# Patient Record
Sex: Female | Born: 1935
Health system: Southern US, Community
[De-identification: ages and names within clinical notes are randomized; demographics above are authoritative.]

## PROBLEM LIST (undated history)

## (undated) DIAGNOSIS — I10 Essential (primary) hypertension: Secondary | ICD-10-CM

## (undated) DIAGNOSIS — K219 Gastro-esophageal reflux disease without esophagitis: Secondary | ICD-10-CM

## (undated) DIAGNOSIS — E079 Disorder of thyroid, unspecified: Secondary | ICD-10-CM

## (undated) DIAGNOSIS — R5383 Other fatigue: Secondary | ICD-10-CM

## (undated) DIAGNOSIS — C55 Malignant neoplasm of uterus, part unspecified: Secondary | ICD-10-CM

## (undated) DIAGNOSIS — M1990 Unspecified osteoarthritis, unspecified site: Secondary | ICD-10-CM

## (undated) DIAGNOSIS — I639 Cerebral infarction, unspecified: Secondary | ICD-10-CM

## (undated) DIAGNOSIS — J189 Pneumonia, unspecified organism: Secondary | ICD-10-CM

## (undated) DIAGNOSIS — K5900 Constipation, unspecified: Secondary | ICD-10-CM

## (undated) DIAGNOSIS — E785 Hyperlipidemia, unspecified: Secondary | ICD-10-CM

## (undated) DIAGNOSIS — C50019 Malignant neoplasm of nipple and areola, unspecified female breast: Secondary | ICD-10-CM

## (undated) DIAGNOSIS — M810 Age-related osteoporosis without current pathological fracture: Secondary | ICD-10-CM

## (undated) DIAGNOSIS — Z889 Allergy status to unspecified drugs, medicaments and biological substances status: Secondary | ICD-10-CM

## (undated) DIAGNOSIS — J302 Other seasonal allergic rhinitis: Secondary | ICD-10-CM

## (undated) DIAGNOSIS — Z8619 Personal history of other infectious and parasitic diseases: Secondary | ICD-10-CM

## (undated) DIAGNOSIS — E039 Hypothyroidism, unspecified: Secondary | ICD-10-CM

## (undated) DIAGNOSIS — N390 Urinary tract infection, site not specified: Secondary | ICD-10-CM

## (undated) DIAGNOSIS — J449 Chronic obstructive pulmonary disease, unspecified: Secondary | ICD-10-CM

## (undated) HISTORY — DX: Malignant neoplasm of uterus, part unspecified: C55

## (undated) HISTORY — DX: Other fatigue: R53.83

## (undated) HISTORY — PX: BREAST LUMPECTOMY: SHX2

## (undated) HISTORY — DX: Urinary tract infection, site not specified: N39.0

## (undated) HISTORY — PX: TONSILLECTOMY: SUR1361

## (undated) HISTORY — PX: ABDOMINAL HYSTERECTOMY: SHX81

## (undated) HISTORY — DX: Constipation, unspecified: K59.00

## (undated) HISTORY — DX: Age-related osteoporosis without current pathological fracture: M81.0

## (undated) HISTORY — DX: Other seasonal allergic rhinitis: J30.2

## (undated) HISTORY — PX: EYE SURGERY: SHX253

## (undated) HISTORY — DX: Malignant neoplasm of nipple and areola, unspecified female breast: C50.019

---

## 1997-06-25 ENCOUNTER — Other Ambulatory Visit: Admission: RE | Admit: 1997-06-25 | Discharge: 1997-06-25 | Payer: Self-pay | Admitting: Family Medicine

## 1997-11-27 ENCOUNTER — Other Ambulatory Visit: Admission: RE | Admit: 1997-11-27 | Discharge: 1997-11-27 | Payer: Self-pay | Admitting: Obstetrics & Gynecology

## 2000-08-24 ENCOUNTER — Encounter: Payer: Self-pay | Admitting: Family Medicine

## 2000-08-24 ENCOUNTER — Encounter: Admission: RE | Admit: 2000-08-24 | Discharge: 2000-08-24 | Payer: Self-pay | Admitting: Family Medicine

## 2003-06-25 ENCOUNTER — Other Ambulatory Visit: Admission: RE | Admit: 2003-06-25 | Discharge: 2003-06-25 | Payer: Self-pay | Admitting: Obstetrics & Gynecology

## 2005-02-15 DIAGNOSIS — Z8619 Personal history of other infectious and parasitic diseases: Secondary | ICD-10-CM

## 2005-02-15 HISTORY — DX: Personal history of other infectious and parasitic diseases: Z86.19

## 2005-09-12 ENCOUNTER — Emergency Department (HOSPITAL_COMMUNITY): Admission: EM | Admit: 2005-09-12 | Discharge: 2005-09-12 | Payer: Self-pay | Admitting: Emergency Medicine

## 2005-10-19 ENCOUNTER — Encounter: Admission: RE | Admit: 2005-10-19 | Discharge: 2005-10-19 | Payer: Self-pay | Admitting: Internal Medicine

## 2005-11-09 ENCOUNTER — Ambulatory Visit (HOSPITAL_COMMUNITY): Admission: RE | Admit: 2005-11-09 | Discharge: 2005-11-09 | Payer: Self-pay | Admitting: Internal Medicine

## 2008-02-16 DIAGNOSIS — I639 Cerebral infarction, unspecified: Secondary | ICD-10-CM

## 2008-02-16 HISTORY — DX: Cerebral infarction, unspecified: I63.9

## 2008-05-13 ENCOUNTER — Encounter: Admission: RE | Admit: 2008-05-13 | Discharge: 2008-05-13 | Payer: Self-pay | Admitting: Internal Medicine

## 2008-05-14 ENCOUNTER — Ambulatory Visit: Admission: RE | Admit: 2008-05-14 | Discharge: 2008-05-14 | Payer: Self-pay | Admitting: Internal Medicine

## 2008-05-14 ENCOUNTER — Encounter (INDEPENDENT_AMBULATORY_CARE_PROVIDER_SITE_OTHER): Payer: Self-pay | Admitting: Internal Medicine

## 2008-05-14 ENCOUNTER — Ambulatory Visit: Payer: Self-pay | Admitting: Surgery

## 2008-06-05 ENCOUNTER — Ambulatory Visit: Payer: Self-pay | Admitting: Vascular Surgery

## 2008-06-10 ENCOUNTER — Encounter: Payer: Self-pay | Admitting: Vascular Surgery

## 2008-06-10 ENCOUNTER — Inpatient Hospital Stay (HOSPITAL_COMMUNITY): Admission: RE | Admit: 2008-06-10 | Discharge: 2008-06-14 | Payer: Self-pay | Admitting: Vascular Surgery

## 2008-06-10 HISTORY — PX: CAROTID ENDARTERECTOMY: SUR193

## 2008-06-26 ENCOUNTER — Ambulatory Visit: Payer: Self-pay | Admitting: Vascular Surgery

## 2008-08-29 ENCOUNTER — Ambulatory Visit (HOSPITAL_COMMUNITY): Admission: RE | Admit: 2008-08-29 | Discharge: 2008-08-29 | Payer: Self-pay | Admitting: Internal Medicine

## 2009-01-01 ENCOUNTER — Ambulatory Visit: Payer: Self-pay | Admitting: Vascular Surgery

## 2009-08-20 ENCOUNTER — Ambulatory Visit: Payer: Self-pay | Admitting: Vascular Surgery

## 2010-05-24 LAB — BLOOD GAS, ARTERIAL
Acid-Base Excess: 5.7 mmol/L — ABNORMAL HIGH (ref 0.0–2.0)
Bicarbonate: 29.5 mEq/L — ABNORMAL HIGH (ref 20.0–24.0)
Drawn by: 12206
FIO2: 0.21 %
O2 Saturation: 89.2 %
Patient temperature: 98.6
TCO2: 30.8 mmol/L (ref 0–100)
pCO2 arterial: 41.7 mmHg (ref 35.0–45.0)
pH, Arterial: 7.464 — ABNORMAL HIGH (ref 7.350–7.400)
pO2, Arterial: 56.8 mmHg — ABNORMAL LOW (ref 80.0–100.0)

## 2010-05-27 LAB — BASIC METABOLIC PANEL
BUN: 13 mg/dL (ref 6–23)
BUN: 18 mg/dL (ref 6–23)
Calcium: 9.1 mg/dL (ref 8.4–10.5)
Chloride: 102 mEq/L (ref 96–112)
Chloride: 102 mEq/L (ref 96–112)
Creatinine, Ser: 0.93 mg/dL (ref 0.4–1.2)
Creatinine, Ser: 0.99 mg/dL (ref 0.4–1.2)
GFR calc Af Amer: >60 mL/min (ref >60)
GFR calc non Af Amer: 57 mL/min — ABNORMAL LOW (ref >60)
Glucose, Bld: 139 mg/dL — ABNORMAL HIGH (ref 70–99)

## 2010-05-27 LAB — COMPREHENSIVE METABOLIC PANEL
Alkaline Phosphatase: 73 U/L (ref 39–117)
BUN: 23 mg/dL (ref 6–23)
CO2: 31 mEq/L (ref 19–32)
Chloride: 103 mEq/L (ref 96–112)
GFR calc non Af Amer: 43 mL/min — ABNORMAL LOW (ref >60)
Glucose, Bld: 105 mg/dL — ABNORMAL HIGH (ref 70–99)
Potassium: 4.2 mEq/L (ref 3.5–5.1)
Total Bilirubin: 0.6 mg/dL (ref 0.3–1.2)

## 2010-05-27 LAB — CBC
HCT: 31 % — ABNORMAL LOW (ref 36.0–46.0)
HCT: 39.2 % (ref 36.0–46.0)
Hemoglobin: 13.4 g/dL (ref 12.0–15.0)
MCV: 92.4 fL (ref 78.0–100.0)
Platelets: 192 10*3/uL (ref 150–400)
RBC: 4.2 MIL/uL (ref 3.87–5.11)
RDW: 14.1 % (ref 11.5–15.5)
RDW: 14.2 % (ref 11.5–15.5)
WBC: 10.5 10*3/uL (ref 4.0–10.5)
WBC: 7.9 10*3/uL (ref 4.0–10.5)

## 2010-05-27 LAB — PROTIME-INR
INR: 1 (ref 0.00–1.49)
Prothrombin Time: 13.5 seconds (ref 11.6–15.2)

## 2010-05-27 LAB — URINALYSIS, ROUTINE W REFLEX MICROSCOPIC
Bilirubin Urine: NEGATIVE
Glucose, UA: NEGATIVE mg/dL
Hgb urine dipstick: NEGATIVE
Protein, ur: NEGATIVE mg/dL
Urobilinogen, UA: 1 mg/dL (ref 0.0–1.0)

## 2010-05-27 LAB — TYPE AND SCREEN: ABO/RH(D): A POS

## 2010-05-27 LAB — ABO/RH: ABO/RH(D): A POS

## 2010-05-27 LAB — URINE MICROSCOPIC-ADD ON

## 2010-06-30 NOTE — Assessment & Plan Note (Signed)
OFFICE VISIT   VLASTA, LILLYWHITE  DOB:  [birth year]                                       06/05/2008  CHART#:[id number]   Ms. Glauner is an 77 year old female referred by Dr. Lavone Orn for  evaluation of carotid disease.  The patient apparently had an episode of  blurry vision in both eyes near the end of March.  She did not have any  confusion at that time or any slurring of her speech.  However, she  stated that she did have double vision at that time.  She stated this  returned to normal after about 3 days.  Subsequently, she had an MRI the  brain which showed a small infarct of the left parietal region.   Her primary atherosclerotic risk factors include age, hypertension,  elevated cholesterol.  She has had no prior strokes.  She has no prior  TIA episodes.  She has had no similar episodes or any episodes of  amaurosis since that time.   Her past medical history, otherwise, is fairly unremarkable.   PAST SURGICAL HISTORY:  She had a hysterectomy and breast lumpectomy.   MEDICATIONS:  1. Levothyroxine 100 mg once a day.  2. Zocor once a day.  3. Aspirin 325 mg once a day.  4. Norvasc once a day.  5. Diovan HCT once a day.  6. Allegra once a day.  7. ICaps once a day.  8. Macrobid 3 times per week.  9. Vitamin D once monthly.  10.Centrum Silver once a day.  She did not know the doses for these medications.   She is ALLERGIC TO PENICILLIN which caused a rash.  She also had a  REACTION TO SULFA which caused her shortness of breath.   FAMILY HISTORY:  Unremarkable.   SOCIAL HISTORY:  She is widowed.  She is a former smoker but quit in  1983.  She drinks 2-3 alcoholic beverages per week.   REVIEW OF SYSTEMS:  She is 5 feet tall, 130 pounds.  CARDIAC:  She has dyspnea with exertion.  GI:  She has some mild reflux.  GU:  She has urinary frequency.  PULMONARY, VASCULAR, NEUROLOGIC, ORTHOPEDIC, PSYCHIATRIC, ENT, and  HEMATOLOGIC review of systems  are otherwise negative.   PHYSICAL EXAM:  Blood pressure is 162/71 in the left arm, 155/68 in the  right arm.  Pulse is 80 and regular.  HEENT:  Unremarkable.  Neck:  Has  2+ carotid pulses without bruit.  Chest:  Clear to auscultation.  Cardiac exam is regular rate rhythm without murmur.  Abdomen is soft,  nontender, nondistended, no masses.  Extremities:  She has 2+ radial,  femoral, dorsalis pedis, and posterior tibial pulses bilaterally.   She had a carotid duplex exam at [facility], May 14, 2008.  This showed a 60% to 80% left internal carotid artery stenosis and no  significant right internal carotid artery stenosis.  She had antegrade  vertebral flow bilaterally.   Neurologic exam today shows symmetric upper extremity lower extremity  motor strength.  Extraocular movements are intact.  She has no obvious  visual field cuts.   I had a lengthy discussion with Mrs. Kucera today as well as her niece  who was here with her for the office visit today.  I explained to her  that she has a moderate carotid  stenosis on the left side but most  likely this was responsible for the stroke that she had previously.  I  believe the best option for her would be a left carotid endarterectomy.  The risks, benefits, possible complications, and procedure details  including but not limited to bleeding, infection, cranial nerve injury,  stroke risk of 1% to 2%, were explained to the patient as well as option  of medical management with higher stroke risk.  She understands and  agrees to proceed with carotid endarterectomy.  This is scheduled for  Monday, June 10, 2008.   Jessy Oto. Fields, MD  Electronically Signed   CEF/MEDQ  D:  06/05/2008  T:  06/06/2008  Job:  [id number]   cc:   Delanna Ahmadi, M.D.

## 2010-06-30 NOTE — Procedures (Signed)
CAROTID DUPLEX EXAM   INDICATION:  Follow up carotid artery disease.   HISTORY:  Diabetes:  No.  Cardiac:  No.  Hypertension:  Yes.  Smoking:  No.  Previous Surgery:  Left CEA June 10, 2008.  CV History:  Asymptomatic.  Amaurosis Fugax No, Paresthesias No, Hemiparesis No                                       RIGHT               LEFT  Brachial systolic pressure:         120                 114  Brachial Doppler waveforms:         WNL                 WNL  Vertebral direction of flow:        Antegrade           Antegrade  DUPLEX VELOCITIES (cm/sec)  CCA peak systolic                   120                 [id number]  ECA peak systolic                   154                 221 (mid)  ICA peak systolic                   160                 0000000  ICA end diastolic                   34                  37  PLAQUE MORPHOLOGY:                  Mixed               Homogenous  PLAQUE AMOUNT:                      Moderate            None in ICA  PLAQUE LOCATION:                    ICA/ECA/bifurcation ECA   IMPRESSION:  1. Right internal carotid artery shows evidence of 40% to 59%      stenosis.  2. Left internal carotid artery velocities are suggestive of 40% to      59% stenosis near distal end of patch, however no plaque visualized      status post carotid endarterectomy.  3. Left mid external carotid artery stenosis.   ___________________________________________  Jessy Oto Fields, MD   AS/MEDQ  D:  01/01/2009  T:  01/01/2009  Job:  [id number]

## 2010-06-30 NOTE — Discharge Summary (Signed)
NAME:  Katherine [name], Katherine [name]                 ACCOUNT NO.:  [id number]   MEDICAL RECORD NO.:  [id number]          PATIENT TYPE:  INP   LOCATION:  2020                         FACILITY:  [facility]   PHYSICIAN:  Jessy Oto. Fields, MD  DATE OF BIRTH:  06/28/1917   DATE OF ADMISSION:  06/10/2008  DATE OF DISCHARGE:  06/14/2008                               DISCHARGE SUMMARY   FINAL DISCHARGE DIAGNOSES:  1. Carotid occlusive disease on left side.  2. Hypothyroidism.  3. Dyslipidemia.  4. Hypertension.  5. Chronic urinary tract infections.   PROCEDURES PERFORMED:  Left carotid endarterectomy with Dacron patch  angioplasty closure by Dr. Oneida Alar on June 10, 2008.   COMPLICATIONS:  None.   CONDITION ON DISCHARGE:  Stable, improving.   DISCHARGE MEDICATIONS:  She is instructed to resume preoperative  medications consisting of:  1. Synthroid 100 mcg p.o. daily.  2. Zocor 20 mg p.o. daily.  3. Aspirin 325 mg p.o. daily.  4. Diovan/hydrochlorothiazide 160/12.5 p.o. daily.  5. Norvasc 5 mg p.o. daily.  6. I-Cap 1 p.o. daily.  7. Allegra p.o. daily.  8. Vitamin D 50,000 international units q. month.  9. Macrobid 100 mg p.o. 3 times weekly.  10.Centrum Silver p.o. daily.  11.Calcium 600 mg plus vitamin D p.o. daily.  12.Percocet 5/325 one p.o. q.4 h. p.r.n. pain, total number of 4      tablets were given.   DISPOSITION:  She is being discharged home in stable condition with her  wounds healing well.  She is given careful instructions regarding the  care of her wounds and her activity level.  She is scheduled to see Dr.  Oneida Alar in 2 weeks.  The office will arrange a visit.  She is also being  discharged home on 2 liters of oxygen.  O2 saturations performed  predischarge demonstrated O2 sats to be 81-85% on room air.   Brief identifying statement of complete details, please refer the typed  history and physical.  Briefly, this very pleasant 77 year old woman was  referred to Dr. Oneida Alar for  evaluation of carotid disease following an  episode of blurry vision in both eyes.  She was found to have a  moderately narrowed left carotid artery.  Dr. Oneida Alar recommended left  carotid endarterectomy for stroke prevention.  She was informed of the  risks and benefits of the procedure and after careful consideration she  elected to proceed with surgery.   HOSPITAL COURSE:  Preoperative workup was completed as an outpatient.  She was brought in through same-day surgery and underwent the  aforementioned left carotid endarterectomy.  For complete details,  please refer the typed operative report.  The procedure was without  complication.  She was returned to the [facility]  extubated.  Following stabilization, she was transferred to a bed on a  surgical step-down unit.  Following morning, she appeared somewhat weak  and debilitated.  We elected to keep her in the hospital and transfer  her to a bed on a surgical convalescent floor.   Following transfer, her activity level and diet were advanced as  tolerated.  She did have some issues with oxygenation which at this time  is should clear within the next 2-3 weeks.  She is being discharged with  oxygen in stable condition on June 14, 2008.      Chad Cordial, PA      Jessy Oto. Fields, MD  Electronically Signed    KEL/MEDQ  D:  06/14/2008  T:  06/14/2008  Job:  [id number]

## 2010-06-30 NOTE — Assessment & Plan Note (Signed)
OFFICE VISIT   GLADYSE, DUREE  DOB:  1917/08/15                                       06/26/2008  CHART#:[id number]   The patient returns for follow-up today after her left carotid  endarterectomy on April 26th.  She had some problems with oxygen  desaturation postoperatively and was sent home on oxygen.  She has been  using 2 liters of oxygen at home and states she has basically been  asymptomatic and has the oxygen off occasionally without symptoms.  She  is doing some physical therapy and says she has some occasional  desaturations during strenuous activity but otherwise is fine.  She  denies any symptoms of TIA, amaurosis or stroke.  She has no neurologic  deficits.   PHYSICAL EXAMINATION:  Today, blood pressure 161/72 in the left arm,  169/73 in the right arm, pulse is 70 and regular.  Oxygen saturations  today, after being on room air for approximately 15 minutes, were 94%.  Left neck incision is well-healed.  She has no carotid bruits.   She continues to take aspirin 325 mg once a day.   Overall, the patient seems to be recovering well from her carotid  endarterectomy.  We have discontinued her home oxygen today.  She has  follow-up with Dr. Lavone Orn in June.  He will re-evaluate her  oxygenation and symptoms at his discretion whether or not she would need  further oxygen in the future.  However, since she did not require this  preoperatively, I do not believe she will probably need this in the long-  term.  She will follow up with me for repeat carotid duplex exam in 6  months' time.   Jessy Oto. Fields, MD  Electronically Signed   CEF/MEDQ  D:  06/26/2008  T:  06/27/2008  Job:  [id number]   cc:   Delanna Ahmadi, M.D.

## 2010-06-30 NOTE — Procedures (Signed)
CAROTID DUPLEX EXAM   INDICATION:  Follow up carotid artery disease.   HISTORY:  Diabetes:  No.  Cardiac:  No.  Hypertension:  Yes.  Smoking:  No.  Previous Surgery:  Left carotid endarterectomy 06/10/2008.  CV History:  No.  Amaurosis Fugax No, Paresthesias No, Hemiparesis No.                                       RIGHT             LEFT  Brachial systolic pressure:         115               120  Brachial Doppler waveforms:         WNL               WNL  Vertebral direction of flow:        Antegrade         Antegrade  DUPLEX VELOCITIES (cm/sec)  CCA peak systolic                   88                97  ECA peak systolic                   71                [id number]  ICA peak systolic                   140               83  ICA end diastolic                   32                21  PLAQUE MORPHOLOGY:                  Heterogenous      Heterogenous  PLAQUE AMOUNT:                      Mild              Mild  PLAQUE LOCATION:                    ICA               ICA   IMPRESSION:  1. Right internal carotid artery suggests 40% to 59% stenosis.  2. Left internal carotid artery suggests 20% to 39% stenosis, status      post carotid endarterectomy.  These velocities do not appear as      elevated as previous study.  3. Left external carotid artery stenosis.  4. Bilateral vertebrals appear antegrade.   ___________________________________________  Jessy Oto. Fields, MD   CB/MEDQ  D:  08/20/2009  T:  08/20/2009  Job:  [id number]

## 2010-06-30 NOTE — Op Note (Signed)
NAME:  Katherine [name], Katherine [name]                 ACCOUNT NO.:  [id number]   MEDICAL RECORD NO.:  [id number]          PATIENT TYPE:  INP   LOCATION:  2020                         FACILITY:  [facility]   PHYSICIAN:  Jessy Oto. Fields, MD  DATE OF BIRTH:  [birth year]   DATE OF PROCEDURE:  06/10/2008  DATE OF DISCHARGE:                               OPERATIVE REPORT   PROCEDURE:  Left carotid endarterectomy.   PREOPERATIVE DIAGNOSIS:  Symptomatic left internal carotid artery  stenosis.   POSTOPERATIVE DIAGNOSIS:  Symptomatic left internal carotid artery  stenosis.   ANESTHESIA:  General.   ASSISTANT:  Jacinta Shoe, PA-C   OPERATIVE FINDINGS:  1. An 80% heavily calcified left internal carotid artery stenosis.  2. Dacron patch.  3. A 10-French hunt.   OPERATIVE DETAILS:  After obtaining informed consent, the patient was  taken to the operating room.  The patient was placed in supine position  on the operating room table.  After induction of general anesthesia and  endotracheal intubation, the patient's entire left neck and chest were  prepped and draped in usual sterile fashion.  Next, an oblique incision  was made along the left aspect of the neck just anterior to the border  of the left sternocleidomastoid muscle.  Dissection was carried down  through the subcutaneous tissues and platysma muscle.  Left  sternocleidomastoid muscle was identified and reflected laterally.  The  left internal jugular vein was identified and reflected laterally.  The  common facial vein was dissected free circumferentially and ligated and  divided between silk ties.  The omohyoid muscle was divided with  cautery.  The common carotid artery was dissected free at the base of  the incision and an umbilical tape placed around this.  The vagus nerve  was identified and protected.  Dissection was then carried up to the  level of carotid bifurcation.  The external carotids and superior  thyroid arteries were dissected  free circumferentially and vessel loop  placed around these.  The lesion in the internal carotid artery was  identified by palpation.  The internal carotid artery was dissected free  circumferentially above the level of lesion.  This required mobilization  of the ansa up to the level of the hypoglossal nerve and I dissected the  inferior portion of the hypoglossal nerve free, but I was able to get up  to a suitable portion of the left internal carotid artery without  mobilizing the hypoglossal nerve significantly.  A vessel loop was  placed around the internal carotid artery at this level as well.   The patient was then given 5000 units of intravenous heparin.  Left  internal carotid artery was controlled with a small bulldog clamp.  The  external carotid and superior thyroid artery was controlled with vessel  loops.  The common carotid artery was controlled with peripheral DeBakey  clamp.  A longitudinal opening was made in the carotid artery just below  the level of bifurcation.  The arteriotomy was extended up through the  carotid bifurcation to the internal carotid artery using Potts scissors.  There  was a heavily calcified lesion with proximally 80% stenosis and  this was opened longitudinally up of more normal segment of the left  internal carotid artery.  A 10-French shunt was then brought up in the  operative field and threaded into the distal internal carotid artery and  allowed to back bleed thoroughly.  There was good pulsatile  backbleeding.  This was then threaded down into the left common carotid  artery and secured with a Rumel tourniquet.  The shunt was opened with  restoration of flow to the brain after approximately 4 minutes of  ischemia time.  Next, endarterectomy was begun in a suitable plane near  the carotid bifurcation.  The plaque was removed with a good proximal  and distal endpoint.  Plaque was passed off table as specimen.  The  internal carotid artery was  endarterectomized by eversion technique.  A  Dacron patch was then brought up in the operative field and sewn as  patch angioplasty using running 6-0 Prolene suture.  Just prior  completion of anastomosis, the shunt was then reoccluded with a  hemostat.  Shunt was pulled down onto the left internal carotid artery  and this was allowed to back bleed thoroughly.  This was then resecured  with fine bulldog clamp.  Shunt was then removed from the proximal  common carotid artery and this was secured with peripheral DeBakey  clamp.  The external carotid artery was thoroughly back bled and then  resecured with a vessel loop.  Everything was thoroughly irrigated with  heparinized saline.  The remainder of the patch was completed.  Flow was  then first restored from common carotid artery up to the external  carotid artery after approximately 5 cardiac cycles to the internal  carotid artery.  This was then inspected with Doppler.  There was good  Doppler flow through the internal, external, and common carotid arteries  at this point.  Next, hemostasis was obtained.  Platysma muscle was  reapproximated using running 3-0 Vicryl suture.  Skin was closed with a  4-0 Vicryl subcuticular stitch.  The patient tolerated the procedure  well, and there were no complications.  Instrument, sponge, and needle  counts were correct at the end of the case.  The patient was awakened in  the operating room and moving upper extremities and lower extremities  symmetrically at the end of the case.  The patient was taken to the  recovery room in stable condition.      Jessy Oto. Fields, MD  Electronically Signed     CEF/MEDQ  D:  06/11/2008  T:  06/12/2008  Job:  [id number]

## 2010-09-03 ENCOUNTER — Encounter: Payer: Self-pay | Admitting: Podiatry

## 2010-10-12 ENCOUNTER — Ambulatory Visit
Admission: RE | Admit: 2010-10-12 | Discharge: 2010-10-12 | Disposition: A | Discharge status: Home / Self Care | Payer: Medicare Other | Source: Ambulatory Visit | Attending: Internal Medicine | Admitting: Internal Medicine

## 2010-10-12 ENCOUNTER — Other Ambulatory Visit: Payer: Self-pay | Admitting: Internal Medicine

## 2010-10-12 DIAGNOSIS — M79669 Pain in unspecified lower leg: Secondary | ICD-10-CM

## 2011-03-08 DIAGNOSIS — K219 Gastro-esophageal reflux disease without esophagitis: Secondary | ICD-10-CM | POA: Diagnosis not present

## 2011-03-08 DIAGNOSIS — J449 Chronic obstructive pulmonary disease, unspecified: Secondary | ICD-10-CM | POA: Diagnosis not present

## 2011-03-08 DIAGNOSIS — E039 Hypothyroidism, unspecified: Secondary | ICD-10-CM | POA: Diagnosis not present

## 2011-03-08 DIAGNOSIS — E785 Hyperlipidemia, unspecified: Secondary | ICD-10-CM | POA: Diagnosis not present

## 2011-03-08 DIAGNOSIS — I10 Essential (primary) hypertension: Secondary | ICD-10-CM | POA: Diagnosis not present

## 2011-03-15 DIAGNOSIS — B351 Tinea unguium: Secondary | ICD-10-CM | POA: Diagnosis not present

## 2011-03-23 DIAGNOSIS — R350 Frequency of micturition: Secondary | ICD-10-CM | POA: Diagnosis not present

## 2011-03-23 DIAGNOSIS — R351 Nocturia: Secondary | ICD-10-CM | POA: Diagnosis not present

## 2011-03-23 DIAGNOSIS — N3020 Other chronic cystitis without hematuria: Secondary | ICD-10-CM | POA: Diagnosis not present

## 2011-05-10 DIAGNOSIS — B351 Tinea unguium: Secondary | ICD-10-CM | POA: Diagnosis not present

## 2011-06-16 DIAGNOSIS — L821 Other seborrheic keratosis: Secondary | ICD-10-CM | POA: Diagnosis not present

## 2011-06-16 DIAGNOSIS — L570 Actinic keratosis: Secondary | ICD-10-CM | POA: Diagnosis not present

## 2011-06-16 DIAGNOSIS — D692 Other nonthrombocytopenic purpura: Secondary | ICD-10-CM | POA: Diagnosis not present

## 2011-06-16 DIAGNOSIS — Z85828 Personal history of other malignant neoplasm of skin: Secondary | ICD-10-CM | POA: Diagnosis not present

## 2011-07-19 DIAGNOSIS — B351 Tinea unguium: Secondary | ICD-10-CM | POA: Diagnosis not present

## 2011-07-23 ENCOUNTER — Emergency Department (HOSPITAL_COMMUNITY): Payer: Medicare Other

## 2011-07-23 ENCOUNTER — Inpatient Hospital Stay (HOSPITAL_COMMUNITY)
Admission: EM | Admit: 2011-07-23 | Discharge: 2011-07-27 | DRG: 193 | Disposition: A | Discharge status: Home Health | Payer: Medicare Other | Source: Ambulatory Visit | Attending: Internal Medicine | Admitting: Internal Medicine

## 2011-07-23 ENCOUNTER — Encounter (HOSPITAL_COMMUNITY): Payer: Self-pay | Admitting: *Deleted

## 2011-07-23 DIAGNOSIS — E782 Mixed hyperlipidemia: Secondary | ICD-10-CM | POA: Diagnosis not present

## 2011-07-23 DIAGNOSIS — J189 Pneumonia, unspecified organism: Principal | ICD-10-CM | POA: Diagnosis present

## 2011-07-23 DIAGNOSIS — I10 Essential (primary) hypertension: Secondary | ICD-10-CM | POA: Diagnosis not present

## 2011-07-23 DIAGNOSIS — K5900 Constipation, unspecified: Secondary | ICD-10-CM | POA: Diagnosis not present

## 2011-07-23 DIAGNOSIS — Z79899 Other long term (current) drug therapy: Secondary | ICD-10-CM | POA: Diagnosis not present

## 2011-07-23 DIAGNOSIS — J4489 Other specified chronic obstructive pulmonary disease: Secondary | ICD-10-CM | POA: Diagnosis present

## 2011-07-23 DIAGNOSIS — Z7982 Long term (current) use of aspirin: Secondary | ICD-10-CM

## 2011-07-23 DIAGNOSIS — R0902 Hypoxemia: Secondary | ICD-10-CM

## 2011-07-23 DIAGNOSIS — J9620 Acute and chronic respiratory failure, unspecified whether with hypoxia or hypercapnia: Secondary | ICD-10-CM | POA: Diagnosis present

## 2011-07-23 DIAGNOSIS — R042 Hemoptysis: Secondary | ICD-10-CM | POA: Diagnosis not present

## 2011-07-23 DIAGNOSIS — E039 Hypothyroidism, unspecified: Secondary | ICD-10-CM | POA: Diagnosis not present

## 2011-07-23 DIAGNOSIS — Z66 Do not resuscitate: Secondary | ICD-10-CM | POA: Diagnosis present

## 2011-07-23 DIAGNOSIS — Z8673 Personal history of transient ischemic attack (TIA), and cerebral infarction without residual deficits: Secondary | ICD-10-CM | POA: Diagnosis not present

## 2011-07-23 DIAGNOSIS — Z87891 Personal history of nicotine dependence: Secondary | ICD-10-CM

## 2011-07-23 DIAGNOSIS — Z418 Encounter for other procedures for purposes other than remedying health state: Secondary | ICD-10-CM

## 2011-07-23 DIAGNOSIS — E785 Hyperlipidemia, unspecified: Secondary | ICD-10-CM | POA: Diagnosis present

## 2011-07-23 DIAGNOSIS — R599 Enlarged lymph nodes, unspecified: Secondary | ICD-10-CM | POA: Diagnosis not present

## 2011-07-23 DIAGNOSIS — J438 Other emphysema: Secondary | ICD-10-CM | POA: Diagnosis not present

## 2011-07-23 DIAGNOSIS — J449 Chronic obstructive pulmonary disease, unspecified: Secondary | ICD-10-CM | POA: Diagnosis present

## 2011-07-23 DIAGNOSIS — R918 Other nonspecific abnormal finding of lung field: Secondary | ICD-10-CM | POA: Diagnosis not present

## 2011-07-23 DIAGNOSIS — R911 Solitary pulmonary nodule: Secondary | ICD-10-CM | POA: Diagnosis not present

## 2011-07-23 DIAGNOSIS — J159 Unspecified bacterial pneumonia: Secondary | ICD-10-CM

## 2011-07-23 DIAGNOSIS — Z2989 Encounter for other specified prophylactic measures: Secondary | ICD-10-CM

## 2011-07-23 DIAGNOSIS — J984 Other disorders of lung: Secondary | ICD-10-CM | POA: Diagnosis not present

## 2011-07-23 HISTORY — DX: Hypothyroidism, unspecified: E03.9

## 2011-07-23 HISTORY — DX: Cerebral infarction, unspecified: I63.9

## 2011-07-23 HISTORY — DX: Personal history of other infectious and parasitic diseases: Z86.19

## 2011-07-23 HISTORY — DX: Allergy status to unspecified drugs, medicaments and biological substances: Z88.9

## 2011-07-23 HISTORY — DX: Disorder of thyroid, unspecified: E07.9

## 2011-07-23 HISTORY — DX: Essential (primary) hypertension: I10

## 2011-07-23 HISTORY — DX: Gastro-esophageal reflux disease without esophagitis: K21.9

## 2011-07-23 HISTORY — DX: Unspecified osteoarthritis, unspecified site: M19.90

## 2011-07-23 HISTORY — DX: Hyperlipidemia, unspecified: E78.5

## 2011-07-23 HISTORY — DX: Pneumonia, unspecified organism: J18.9

## 2011-07-23 HISTORY — DX: Chronic obstructive pulmonary disease, unspecified: J44.9

## 2011-07-23 LAB — DIFFERENTIAL
Basophils Relative: 1 % (ref 0–1)
Eosinophils Absolute: 0.1 10*3/uL (ref 0.0–0.7)
Monocytes Relative: 9 % (ref 3–12)
Neutro Abs: 6.3 10*3/uL (ref 1.7–7.7)
Neutrophils Relative %: 65 % (ref 43–77)

## 2011-07-23 LAB — URINALYSIS, ROUTINE W REFLEX MICROSCOPIC
Bilirubin Urine: NEGATIVE
Ketones, ur: 15 mg/dL — AB
Leukocytes, UA: NEGATIVE
Nitrite: NEGATIVE
Protein, ur: NEGATIVE mg/dL
Urobilinogen, UA: 0.2 mg/dL (ref 0.0–1.0)
pH: 7.5 (ref 5.0–8.0)

## 2011-07-23 LAB — POCT I-STAT, CHEM 8
Creatinine, Ser: 1.2 mg/dL — ABNORMAL HIGH (ref 0.50–1.10)
HCT: 35 % — ABNORMAL LOW (ref 36.0–46.0)
Hemoglobin: 11.9 g/dL — ABNORMAL LOW (ref 12.0–15.0)
Potassium: 4.1 mEq/L (ref 3.5–5.1)
Sodium: 142 mEq/L (ref 135–145)
TCO2: 29 mmol/L (ref 0–100)

## 2011-07-23 LAB — CBC
Hemoglobin: 11.7 g/dL — ABNORMAL LOW (ref 12.0–15.0)
MCH: 30.3 pg (ref 26.0–34.0)
Platelets: 293 10*3/uL (ref 150–400)
RBC: 3.86 MIL/uL — ABNORMAL LOW (ref 3.87–5.11)
WBC: 9.7 10*3/uL (ref 4.0–10.5)

## 2011-07-23 LAB — PROTIME-INR
INR: 1.05 (ref 0.00–1.49)
Prothrombin Time: 13.9 seconds (ref 11.6–15.2)

## 2011-07-23 MED ORDER — GUAIFENESIN ER 600 MG PO TB12
600.0000 mg | ORAL_TABLET | Freq: Two times a day (BID) | ORAL | Status: DC
  Administered 2011-07-24 – 2011-07-25 (×4): 600 mg via ORAL
  Filled 2011-07-23 (×7): qty 1

## 2011-07-23 MED ORDER — SIMVASTATIN 20 MG PO TABS
20.0000 mg | ORAL_TABLET | Freq: Every evening | ORAL | Status: DC
  Administered 2011-07-24 – 2011-07-25 (×2): 20 mg via ORAL
  Filled 2011-07-23 (×4): qty 1

## 2011-07-23 MED ORDER — FLUTICASONE PROPIONATE 50 MCG/ACT NA SUSP
2.0000 | Freq: Every day | NASAL | Status: DC
  Administered 2011-07-24 – 2011-07-26 (×3): 2 via NASAL
  Filled 2011-07-23 (×2): qty 16

## 2011-07-23 MED ORDER — AZELASTINE HCL 0.1 % NA SOLN
1.0000 | Freq: Two times a day (BID) | NASAL | Status: DC
  Administered 2011-07-24 – 2011-07-27 (×7): 1 via NASAL
  Filled 2011-07-23 (×2): qty 30

## 2011-07-23 MED ORDER — DEXTROSE 5 % IV SOLN
500.0000 mg | Freq: Once | INTRAVENOUS | Status: AC
  Administered 2011-07-23: 500 mg via INTRAVENOUS
  Filled 2011-07-23 (×2): qty 500

## 2011-07-23 MED ORDER — VANCOMYCIN HCL 500 MG IV SOLR
500.0000 mg | INTRAVENOUS | Status: DC
  Administered 2011-07-24 (×2): 500 mg via INTRAVENOUS
  Filled 2011-07-23 (×5): qty 500

## 2011-07-23 MED ORDER — NITROFURANTOIN MONOHYD MACRO 100 MG PO CAPS
100.0000 mg | ORAL_CAPSULE | Freq: Two times a day (BID) | ORAL | Status: DC
  Administered 2011-07-24 – 2011-07-25 (×4): 100 mg via ORAL
  Filled 2011-07-23 (×7): qty 1

## 2011-07-23 MED ORDER — FLUTICASONE-SALMETEROL 250-50 MCG/DOSE IN AEPB
1.0000 | INHALATION_SPRAY | Freq: Two times a day (BID) | RESPIRATORY_TRACT | Status: DC
  Administered 2011-07-24 – 2011-07-27 (×6): 1 via RESPIRATORY_TRACT
  Filled 2011-07-23 (×3): qty 14

## 2011-07-23 MED ORDER — DEXTROSE 5 % IV SOLN
500.0000 mg | INTRAVENOUS | Status: DC
  Administered 2011-07-24 – 2011-07-25 (×2): 500 mg via INTRAVENOUS
  Filled 2011-07-23 (×2): qty 500

## 2011-07-23 MED ORDER — IOHEXOL 350 MG/ML SOLN
100.0000 mL | Freq: Once | INTRAVENOUS | Status: AC | PRN
  Administered 2011-07-23: 100 mL via INTRAVENOUS

## 2011-07-23 MED ORDER — PIPERACILLIN-TAZOBACTAM 3.375 G IVPB
3.3750 g | Freq: Three times a day (TID) | INTRAVENOUS | Status: DC
  Administered 2011-07-24 – 2011-07-26 (×7): 3.375 g via INTRAVENOUS
  Filled 2011-07-23 (×11): qty 50

## 2011-07-23 MED ORDER — IPRATROPIUM BROMIDE 0.02 % IN SOLN
0.5000 mg | Freq: Four times a day (QID) | RESPIRATORY_TRACT | Status: DC
  Administered 2011-07-24 – 2011-07-26 (×10): 0.5 mg via RESPIRATORY_TRACT
  Filled 2011-07-23 (×10): qty 2.5

## 2011-07-23 MED ORDER — SODIUM CHLORIDE 0.9 % IV SOLN
INTRAVENOUS | Status: DC
  Administered 2011-07-24 – 2011-07-25 (×2): via INTRAVENOUS

## 2011-07-23 MED ORDER — ALBUTEROL SULFATE (5 MG/ML) 0.5% IN NEBU
2.5000 mg | INHALATION_SOLUTION | RESPIRATORY_TRACT | Status: DC | PRN
  Administered 2011-07-24 – 2011-07-26 (×4): 2.5 mg via RESPIRATORY_TRACT
  Filled 2011-07-23 (×4): qty 0.5

## 2011-07-23 MED ORDER — LEVOTHYROXINE SODIUM 75 MCG PO TABS
75.0000 ug | ORAL_TABLET | Freq: Every day | ORAL | Status: DC
  Administered 2011-07-24 – 2011-07-27 (×4): 75 ug via ORAL
  Filled 2011-07-23 (×6): qty 1

## 2011-07-23 MED ORDER — AMLODIPINE BESYLATE 5 MG PO TABS
5.0000 mg | ORAL_TABLET | Freq: Every day | ORAL | Status: DC
  Administered 2011-07-24 – 2011-07-27 (×4): 5 mg via ORAL
  Filled 2011-07-23 (×4): qty 1

## 2011-07-23 MED ORDER — PIPERACILLIN-TAZOBACTAM 3.375 G IVPB 30 MIN
3.3750 g | Freq: Once | INTRAVENOUS | Status: AC
  Administered 2011-07-24: 3.375 g via INTRAVENOUS
  Filled 2011-07-23: qty 50

## 2011-07-23 MED ORDER — DEXTROSE 5 % IV SOLN
1.0000 g | Freq: Once | INTRAVENOUS | Status: AC
  Administered 2011-07-23: 1 g via INTRAVENOUS
  Filled 2011-07-23 (×2): qty 10

## 2011-07-23 NOTE — Consult Note (Signed)
Name: Katherine [name] MRN: [id number] DOB: [birth year]    LOS: 0  Referring Provider:  Dr. Roel Cluck, [place] Reason for Referral:  Hemoptysis  PULMONARY / CRITICAL CARE MEDICINE  HPI:  78 yo F with thyroid disease, HTN, and COPD presenting to the ED with hemoptysis.  She was feeling well until this AM when she felt a tickle in the back of the throat and coughed up bright red blood without sputum.  Over the next several hours she continued to have hemoptysis for a total of 1/4-1/2 a cup.  She was brought to the ED; however hemoptysis resolved with no episodes since.  She denies fevers, chills, night sweats, wt loss, SOB, nausea, CP or epistaxis.  She has never had similar episodes before.  She had PPD placed 2-3 years ago which was negative.  She uses 2L [place] at home.  Past Medical History  Diagnosis Date  . Thyroid disease   . Hypertension   . Hyperlipemia   . Multiple allergies   . COPD (chronic obstructive pulmonary disease)    Past Surgical History  Procedure Date  . Abdominal hysterectomy   . Breast lumpectomy   . Carotid endarterectomy    Prior to Admission medications   Medication Sig Start Date End Date Taking? Authorizing Provider  amLODipine (NORVASC) 5 MG tablet Take 5 mg by mouth daily.   Yes Historical Provider, MD  aspirin EC 81 MG tablet Take 81 mg by mouth daily.   Yes Historical Provider, MD  azelastine (ASTELIN) 137 MCG/SPRAY nasal spray Place 1 spray into the nose 2 (two) times daily. Use in each nostril as directed   Yes Historical Provider, MD  Calcium Carbonate-Vitamin D (CALTRATE 600+D PO) Take 1 tablet by mouth 2 (two) times daily.   Yes Historical Provider, MD  fluticasone (FLONASE) 50 MCG/ACT nasal spray Place 2 sprays into the nose daily.   Yes Historical Provider, MD  levothyroxine (SYNTHROID, LEVOTHROID) 75 MCG tablet Take 75 mcg by mouth daily.   Yes Historical Provider, MD  Multiple Vitamins-Minerals (CENTRUM SILVER PO) Take 1 tablet by mouth daily.    Yes Historical Provider, MD  nitrofurantoin, macrocrystal-monohydrate, (MACROBID) 100 MG capsule Take 100 mg by mouth 2 (two) times daily.   Yes Historical Provider, MD  simvastatin (ZOCOR) 20 MG tablet Take 20 mg by mouth every evening.   Yes Historical Provider, MD  tiotropium (SPIRIVA) 18 MCG inhalation capsule Place 18 mcg into inhaler and inhale daily.   Yes Historical Provider, MD  valsartan-hydrochlorothiazide (DIOVAN-HCT) 160-12.5 MG per tablet Take 1 tablet by mouth daily.   Yes Historical Provider, MD  Vitamin D, Ergocalciferol, (DRISDOL) 50000 UNITS CAPS Take 50,000 Units by mouth every 30 (thirty) days.   Yes Historical Provider, MD   Allergies No Known Allergies  Family History No family history on file. Social History  reports that she has quit smoking. She does not have any smokeless tobacco history on file. She reports that she drinks alcohol. She reports that she does not use illicit drugs.  Review Of Systems:  10 point ROS negative except as listed in HPI  Brief patient description:  78 yo F with h/o tobacco abuse, COPD, HTN  Admitted with hemoptysis.  Events Since Admission: No further hemoptysis.   Vital Signs: Temp:  [97.8 F (36.6 C)-98.4 F (36.9 C)] 97.8 F (36.6 C) (06/07 2151) Pulse Rate:  [76-90] 89  (06/07 2151) Resp:  [16-24] 20  (06/07 2151) BP: (136-163)/(56-111) 163/56 mmHg (06/07 2151) SpO2:  [  79 %-94 %] 91 % (06/07 2151)  Physical Examination: General: lying comfortably in bed Neuro:  Awake, alert, NAD HEENT:  Sclera clear, EOMI, MMM, no thrush or oral lesions Neck:  Supple, no cervical or supraclvicular LAD, no thyromegaly Cardiovascular: RRR. No m/r/g Lungs:  CTAB, nl wob Abdomen:  Soft, NT, ND, no HSM Musculoskeletal:  Joints wnl Skin:  Skin, scattered echymosis  Principal Problem:  *CAP (community acquired pneumonia) Active Problems:  COPD (chronic obstructive pulmonary disease)  Hemoptysis  Hypoxia   ASSESSMENT AND  PLAN  PULMONARY   CXR:   CHEST - 2 VIEW  Comparison: 11/10/2009  Findings: Heart size is mildly enlarged.  There is bilateral upper lobe opacities left greater than right.  Within the right upper lobe there is a nodule which measures 1.3  cm. This appears new when compared with the previous examination.  Large right medial lung base opacity is a chronic finding and is  likely related to prominent pericardial fat.  IMPRESSION:  1. Bilateral upper lobe airspace opacities concerning for  infection.  2. Right upper lobe nodule is new from previous exam. Recommend  further evaluation with CT of the chest.  These results will be called to the ordering clinician or  representative by the Radiologist Assistant, and communication  documented in the PACS Dashboard.  CTA Findings: This is a satisfactory evaluation of the pulmonary  arterial tree. No focal filling defects are identified to suggest  pulmonary embolism.  There is stable cardiomegaly. Prominent number and mildly  prominent size of some mediastinal lymph nodes appears stable  compared to prior CT. The largest is a 15 mm precarinal lymph  node. 10 mm right hilar lymph node is similar to prior study.  Left nodal hilar tissue is also stable. There is a prominent right-  sided pericardial fat pad. There is a small amount of fluid  density within the pericardial fat pad, and the certain  significance.  Negative for pleural or pericardial effusion.  Thoracic aorta is normal in caliber and demonstrates  atherosclerotic changes. There is no aneurysm or dissection.  Moderate centrilobular emphysema is similar to prior exam. There  is patchy bilateral airspace disease, most prominent in the left  upper lobe. There is focal consolidation in the medial left upper  lobe, abutting the mediastinum. There is more patchy airspace  disease in the peripheral left upper lobe. There is also patchy  airspace disease in the right upper lobe.  There is dependent  atelectasis in the right lower lobe.  A discrete lung nodule is not identified.  Extensive multilevel degenerative changes of the spine. No  evidence of fracture. There is mild convex right scoliosis of the  upper thoracic spine and mild convex left scoliosis of the lower  thoracic spine.  IMPRESSION:  1. Bilateral upper lobe airspace disease, more prominent on the  left than right. There is consolidation in the medial left upper  lobe. Findings most likely reflect bilateral pneumonia.  2. Negative for pulmonary embolism.  3. Moderate emphysema.  4. Stable mild mediastinal and bihilar lymphadenopathy. Unchanged  dating back to 2007.  5. Cardiomegaly.  A:  Hemoptysis 2/2 PNA vs endobronchial lesion vs upper airway etiology with drainage into upper lobes.  TB seem unlikely without constitutional symptoms or exposure history but seems warranted given risk of missed dx.  If hemoptysis remains resolved will need to discuss with patient whether repeat chest imaging with resolution is adequate to evaluate single episode or bronchoscopy is needed.  P:   - Agree with covering with antibiotics - Collect routine sputum - Check PPD and AFB x 3 - Continue current COPD regimen  CARDIOVASCULAR  A:stable P: n/a  RENAL  Lab 07/23/11 1543  NA 142  K 4.1  CL 102  CO2 --  BUN 22  CREATININE 1.20*  CALCIUM --  MG --  PHOS --   Intake/Output    None    Foley:  none  A:  stable P:   - Monitor electrolytes  GASTROINTESTINAL No results found for this basename: AST:5,ALT:5,ALKPHOS:5,BILITOT:5,PROT:5,ALBUMIN:5 in the last 168 hours  A:  stable P:   - diet per primary team  HEMATOLOGIC  Lab 07/23/11 1644 07/23/11 1543  HGB 11.7* 11.9*  HCT 35.3* 35.0*  PLT 293 --  INR 1.05 --  APTT -- --   A:  stable P:  n/a  INFECTIOUS  Lab 07/23/11 1644  WBC 9.7  PROCALCITON --   Cultures: Sputum 6/8 >>> Blood cx 6/8 >>> AFB x3 pending  Antibiotics: Zosyn  3/7 >>> Vanc 3/7 >>> Azithromycin 3/7 >>>  A:  Possible PNA P:   Cover with antibiotics as above  ENDOCRINE No results found for this basename: GLUCAP:5 in the last 168 hours A:  Stable hypotyroid P:   - Cont synthroid  NEUROLOGIC  A:  stable P: N/A  BEST PRACTICE / DISPOSITION Level of Care:  [facility] Primary Service: [facility] hospitalist Consultants:  pulm Code Status:  DNR/DNI Diet:  Regular diet DVT Px:  SCDs GI Px:  n/a Skin Integrity:  good Social / Family: no family at bedside this evening.  Erling Arrazola, M.D. Pulmonary and [place] Pager: [phone]  07/23/2011, 11:22 PM

## 2011-07-23 NOTE — H&P (Signed)
PCP:  Irven Shelling, MD, MD    Chief Complaint:   Hemoptysis  HPI: Katherine [name] is a 78 y.o. female   has a past medical history of Thyroid disease; Hypertension; Hyperlipemia; Multiple allergies; and COPD (chronic obstructive pulmonary disease).   Presented with  Starting this morning she had mild cough productive of bright blood, this continued until 11 am. She talked to her PCP who told her to come to  ER. CXR worrisome for PNA and CTA of chest was done showing COPD and bilateral PNA but no PE or mass. She had been tested for Tb in the past and has been negative as per patient. She is at home on O2 2L. Quit smoking 30 y ago. She is on full dose of aspirin for hx of CVA and carotid artery disease.   Review of Systems:    Pertinent positives include:shortness of breath at rest.  Constitutional:  No weight loss, night sweats, Fevers, chills, fatigue, weight loss  HEENT:  No headaches, Difficulty swallowing,Tooth/dental problems,Sore throat,  No sneezing, itching, ear ache, nasal congestion, post nasal drip,  Cardio-vascular:  No chest pain, Orthopnea, PND, anasarca, dizziness, palpitations.no Bilateral lower extremity swelling  GI:  No heartburn, indigestion, abdominal pain, nausea, vomiting, diarrhea, change in bowel habits, loss of appetite, melena, blood in stool, hematemesis Resp:  no  No dyspnea on exertion, No excess mucus, no productive cough, No non-productive cough, No coughing up of blood.No change in color of mucus.No wheezing. Skin:  no rash or lesions. No jaundice GU:  no dysuria, change in color of urine, no urgency or frequency. No straining to urinate.  No flank pain.  Musculoskeletal:  No joint pain or no joint swelling. No decreased range of motion. No back pain.  Psych:  No change in mood or affect. No depression or anxiety. No memory loss.  Neuro: no localizing neurological complaints, no tingling, no weakness, no double vision, no gait abnormality,  no slurred speech, no confusion  Otherwise ROS are negative except for above, 10 systems were reviewed  Past Medical History: Past Medical History  Diagnosis Date  . Thyroid disease   . Hypertension   . Hyperlipemia   . Multiple allergies   . COPD (chronic obstructive pulmonary disease)    Past Surgical History  Procedure Date  . Abdominal hysterectomy   . Breast lumpectomy   . Carotid endarterectomy      Medications: Prior to Admission medications   Medication Sig Start Date End Date Taking? Authorizing Provider  amLODipine (NORVASC) 5 MG tablet Take 5 mg by mouth daily.   Yes Historical Provider, MD  aspirin EC 81 MG tablet Take 81 mg by mouth daily.   Yes Historical Provider, MD  azelastine (ASTELIN) 137 MCG/SPRAY nasal spray Place 1 spray into the nose 2 (two) times daily. Use in each nostril as directed   Yes Historical Provider, MD  Calcium Carbonate-Vitamin D (CALTRATE 600+D PO) Take 1 tablet by mouth 2 (two) times daily.   Yes Historical Provider, MD  fluticasone (FLONASE) 50 MCG/ACT nasal spray Place 2 sprays into the nose daily.   Yes Historical Provider, MD  levothyroxine (SYNTHROID, LEVOTHROID) 75 MCG tablet Take 75 mcg by mouth daily.   Yes Historical Provider, MD  Multiple Vitamins-Minerals (CENTRUM SILVER PO) Take 1 tablet by mouth daily.   Yes Historical Provider, MD  nitrofurantoin, macrocrystal-monohydrate, (MACROBID) 100 MG capsule Take 100 mg by mouth 2 (two) times daily.   Yes Historical Provider, MD  simvastatin (ZOCOR)  20 MG tablet Take 20 mg by mouth every evening.   Yes Historical Provider, MD  tiotropium (SPIRIVA) 18 MCG inhalation capsule Place 18 mcg into inhaler and inhale daily.   Yes Historical Provider, MD  valsartan-hydrochlorothiazide (DIOVAN-HCT) 160-12.5 MG per tablet Take 1 tablet by mouth daily.   Yes Historical Provider, MD  Vitamin D, Ergocalciferol, (DRISDOL) 50000 UNITS CAPS Take 50,000 Units by mouth every 30 (thirty) days.   Yes  Historical Provider, MD    Allergies:  Allergies no known allergies  Social History:  Ambulatory  walker  Lives at  [place]. Living   reports that she has quit smoking. She does not have any smokeless tobacco history on file. She reports that she drinks alcohol. She reports that she does not use illicit drugs.   Family History: family history is not on file.  Non contributory  Physical Exam: Patient Vitals for the past 24 hrs:  BP Temp Temp src Pulse Resp SpO2  07/23/11 2151 163/56 mmHg 97.8 F (36.6 C) Oral 89  20  91 %  07/23/11 1929 136/58 mmHg - - 84  17  93 %  07/23/11 1440 - - - - - 94 %  07/23/11 1436 143/63 mmHg 98.4 F (36.9 C) Oral 76  24  79 %  07/23/11 1157 151/111 mmHg 98 F (36.7 C) Oral 90  16  90 %    1. General:  in No Acute distress 2. Psychological: Alert and  Oriented 3. Head/ENT:   Moist  Mucous Membranes                          Head Non traumatic, neck supple                          Normal  Dentition 4. SKIN:  decreased Skin turgor,  Skin clean Dry and intact no rash 5. Heart: Regular rate and rhythm no Murmur, Rub or gallop 6. Lungs:  no wheezes or crackles  bronchial sounds in upper chest 7. Abdomen: Soft, non-tender, Non distended 8. Lower extremities: no clubbing, cyanosis, or edema 9. Neurologically Grossly intact, moving all 4 extremities equally 10. MSK: Normal range of motion  body mass index is unknown because there is no height or weight on file.   Labs on Admission:   [facility] 07/23/11 1543  NA 142  K 4.1  CL 102  CO2 --  GLUCOSE 104*  BUN 22  CREATININE 1.20*  CALCIUM --  MG --  PHOS --   No results found for this basename: AST:2,ALT:2,ALKPHOS:2,BILITOT:2,PROT:2,ALBUMIN:2 in the last 72 hours No results found for this basename: LIPASE:2,AMYLASE:2 in the last 72 hours  Basename 07/23/11 1644 07/23/11 1543  WBC 9.7 --  NEUTROABS 6.3 --  HGB 11.7* 11.9*  HCT 35.3* 35.0*  MCV 91.5 --  PLT 293 --   No  results found for this basename: CKTOTAL:3,CKMB:3,CKMBINDEX:3,TROPONINI:3 in the last 72 hours No results found for this basename: TSH,T4TOTAL,FREET3,T3FREE,THYROIDAB in the last 72 hours No results found for this basename: VITAMINB12:2,FOLATE:2,FERRITIN:2,TIBC:2,IRON:2,RETICCTPCT:2 in the last 72 hours No results found for this basename: HGBA1C    CrCl is unknown because there is no height on file for the current visit. ABG    Component Value Date/Time   PHART 7.464* 08/29/2008 1455   HCO3 29.5* 08/29/2008 1455   TCO2 29 07/23/2011 1543   O2SAT 89.2 08/29/2008 1455     No results  found for this basename: DDIMER     Other results:  I have pearsonaly reviewed this: ECG REPORT  Rate:86  Rhythm: NSR ST&T Change: no ischemic  UA no evidence of infection.   Cultures: No results found for this basename: sdes, specrequest, cult, reptstatus       Radiological Exams on Admission: Dg Chest 2 View  07/23/2011  *RADIOLOGY REPORT*  Clinical Data: Hemoptysis.  COPD.  CHEST - 2 VIEW  Comparison: 11/10/2009  Findings: Heart size is mildly enlarged.  There is bilateral upper lobe opacities left greater than right.  Within the right upper lobe there is a nodule which measures 1.3 cm.  This appears new when compared with the previous examination.  Large right medial lung base opacity is a chronic finding and is likely related to prominent pericardial fat.  IMPRESSION:  1.  Bilateral upper lobe airspace opacities concerning for infection. 2.  Right upper lobe nodule is new from previous exam.  Recommend further evaluation with CT of the chest.  These results will be called to the ordering clinician or representative by the Radiologist Assistant, and communication documented in the PACS Dashboard.  Original Report Authenticated By: Angelita Ingles, M.D.   Ct Angio Chest W/cm &/or Wo Cm  07/23/2011  *RADIOLOGY REPORT*  Clinical Data: Hemoptysis. Possible right upper lobe nodules seen on today's chest  radiograph.  CT ANGIOGRAPHY CHEST  Technique:  Multidetector CT imaging of the chest using the standard protocol during bolus administration of intravenous contrast. Multiplanar reconstructed images including MIPs were obtained and reviewed to evaluate the vascular anatomy.  Contrast: 157mL OMNIPAQUE IOHEXOL 350 MG/ML SOLN  Comparison: Chest radiograph 06/07/2013and CT chest 10/19/2005  Findings: This is a satisfactory evaluation of the pulmonary arterial tree.  No focal filling defects are identified to suggest pulmonary embolism.  There is stable cardiomegaly.  Prominent number and mildly prominent size of some mediastinal lymph nodes appears stable compared to prior CT.  The largest is a 15 mm precarinal lymph node.  10 mm right hilar lymph node is similar to prior study. Left nodal hilar tissue is also stable. There is a prominent right- sided pericardial fat pad.  There is a small amount of fluid density within the pericardial fat pad, and the certain significance.  Negative for pleural or pericardial effusion.  Thoracic aorta is normal in caliber and demonstrates atherosclerotic changes.  There is no aneurysm or dissection.  Moderate centrilobular emphysema is similar to prior exam.  There is patchy bilateral airspace disease, most prominent in the left upper lobe.  There is focal consolidation in the medial left upper lobe, abutting the mediastinum.  There is more patchy airspace disease in the peripheral left upper lobe.  There is also patchy airspace disease in the right upper lobe.  There is dependent atelectasis in the right lower lobe.  A discrete lung nodule is not identified.  Extensive multilevel degenerative changes of the spine.  No evidence of fracture.  There is mild convex right scoliosis of the upper thoracic spine and mild convex left scoliosis of the lower thoracic spine.  IMPRESSION:  1.  Bilateral upper lobe airspace disease, more prominent on the left than right.  There is consolidation in  the medial left upper lobe.  Findings most likely reflect bilateral pneumonia. 2. Negative for pulmonary embolism.  3.  Moderate emphysema. 4.  Stable mild mediastinal and bihilar lymphadenopathy.  Unchanged dating back to 2007. 5.  Cardiomegaly.  Original Report Authenticated By: Curlene Dolphin, M.D.  Assessment/Plan  78 yo F with hemoptysis and bilateral PNA on CTA no PE.   Present on Admission:  .CAP (community acquired pneumonia) - will admit for treatment of CAP will start on appropriate antibiotic coverage. Given extensive COPD will cover broadly with Zosyn, vanc and azithro as per recommendation of pulmonary.    Obtain sputum cultures, blood cultures if febrile or if decompensates.  Provide oxygen and titrate as needed.   Marland KitchenCOPD (chronic obstructive pulmonary disease) - no evidence of exacerbation, will write for Atrovent, albuterol no indication for steroids at his point. .Hemoptysis - spoke to pulmonary who will see patient and will discuss potential bronchoscopy will watch in step down.  .Hypoxia - likely due to hemoptysis will monitor on continues pulse ox and titrate.    Prophylaxis:  SCD, Protonix  CODE STATUS: DNR/DNI as per patient   Other plan as per orders.  I have spent a total of 65 min on this admission including speaking to consultants  Alanis Clift 07/23/2011, 10:03 PM

## 2011-07-23 NOTE — Progress Notes (Signed)
Patient is a 78 year old lady with known COPD who experienced hemoptysis today. She says she's been having trouble with sinus problems recently. She breathed 2 L of oxygen daily exam shows her to be a pleasant elderly lady in no distress her heart sounds are normal her breath sounds are distant there are no rales rhonchi. Chest x-ray shows bilateral upper lobe infiltrates. Impression #1 COPD #2 community-acquired pneumonia. I recommended laboratory workup and hospitalization for immunity acquired pneumonia.  5:03 PM  Date: 07/23/2011  Rate:86  Rhythm: normal sinus rhythm  QRS Axis: normal  Intervals: normal  ST/T Wave abnormalities: normal  Conduction Disutrbances:none  Narrative Interpretation: Normal EKG  Old EKG Reviewed: unchanged

## 2011-07-23 NOTE — ED Notes (Signed)
Patient states multiple episodes of coughing up blood this am, patient states episodes lasted approx.  One hour, patient states she felt as if blood came from in throat initially, patient states no episodes since that time, patient denies nausea at this time and also denies pain, patient in NAD at this time

## 2011-07-23 NOTE — ED Notes (Signed)
Patient repots sudden onset of bright red blood from her throat at 1030.  She denies pain.  No s/sx of distress.  She had already eaten her breakfast and then her sx started.

## 2011-07-23 NOTE — ED Notes (Signed)
PT provided with food and beverage per request

## 2011-07-23 NOTE — ED Provider Notes (Signed)
History     CSN: [id number]  Arrival date & time 07/23/11  1153   First MD Initiated Contact with Patient 07/23/11 1502      Chief Complaint  Patient presents with  . Bleeding/Bruising    (Consider location/radiation/quality/duration/timing/severity/associated sxs/prior treatment) HPI  78 year old female with history of COPD, allergies, hypertension, and thyroid disease presents complaining of abnormal bleeding. Patient states this morning while she was in bed she proceeds to cough and notice bright red blood in her sputum.  Patient coughs multiple times and were able to bring up bright red blood each time. She quantify it as nearly a cup of blood, but denies any associated pain. She denies lightheadedness, chest pain, shortness of breath, abdominal pain, sore throat, recent surgery, prolonged bed rest, leg swelling or calf pain. She does admits to having allergies and has trouble with sneezing and coughing on a regular basis.  Denies nose bleed.  Pt does not take any blood thinner medication beside ASA.  Does have a hx of GERD and takes prilosec.  Denies any abd pain.  Has normal BM with no change in color.  Sts she did not cough hard when she first notice the blood.  Her sxs has resolved since she has been in the ED.  Pt sts she also spoke with her PCP about her sxs and was recommended to come to ER for further evaluation.  Pt denies similar sxs in the past.    Past Medical History  Diagnosis Date  . Thyroid disease   . Hypertension   . Hyperlipemia   . Multiple allergies   . COPD (chronic obstructive pulmonary disease)     Past Surgical History  Procedure Date  . Abdominal hysterectomy   . Breast lumpectomy   . Carotid endarterectomy     No family history on file.  History  Substance Use Topics  . Smoking status: Former Research scientist (life sciences)  . Smokeless tobacco: Not on file  . Alcohol Use: Yes    OB History    Grav Para Term Preterm Abortions TAB SAB Ect Mult Living                    Review of Systems  Constitutional: Negative for fever, chills and activity change.  HENT: Positive for sneezing. Negative for nosebleeds and neck pain.   Respiratory: Positive for cough. Negative for chest tightness and shortness of breath.   Cardiovascular: Negative for chest pain.  Gastrointestinal: Negative for vomiting, abdominal pain, blood in stool and anal bleeding.  Neurological: Negative for light-headedness and headaches.  Hematological: Does not bruise/bleed easily.    Allergies  Review of patient's allergies indicates not on file.  Home Medications   Current Outpatient Rx  Name Route Sig Dispense Refill  . AMLODIPINE BESYLATE 5 MG PO TABS Oral Take 5 mg by mouth daily.    . ASPIRIN EC 81 MG PO TBEC Oral Take 81 mg by mouth daily.    . AZELASTINE HCL 137 MCG/SPRAY NA SOLN Nasal Place 1 spray into the nose 2 (two) times daily. Use in each nostril as directed    . CALTRATE 600+D PO Oral Take 1 tablet by mouth 2 (two) times daily.    Marland Kitchen FLUTICASONE PROPIONATE 50 MCG/ACT NA SUSP Nasal Place 2 sprays into the nose daily.    Marland Kitchen LEVOTHYROXINE SODIUM 75 MCG PO TABS Oral Take 75 mcg by mouth daily.    . CENTRUM SILVER PO Oral Take 1 tablet by mouth daily.    Marland Kitchen  NITROFURANTOIN MONOHYD MACRO 100 MG PO CAPS Oral Take 100 mg by mouth 2 (two) times daily.    Marland Kitchen SIMVASTATIN 20 MG PO TABS Oral Take 20 mg by mouth every evening.    Marland Kitchen TIOTROPIUM BROMIDE MONOHYDRATE 18 MCG IN CAPS Inhalation Place 18 mcg into inhaler and inhale daily.    Marland Kitchen VALSARTAN-HYDROCHLOROTHIAZIDE 160-12.5 MG PO TABS Oral Take 1 tablet by mouth daily.    Marland Kitchen VITAMIN D (ERGOCALCIFEROL) 50000 UNITS PO CAPS Oral Take 50,000 Units by mouth every 30 (thirty) days.      BP 143/63  Pulse 76  Temp(Src) 98.4 F (36.9 C) (Oral)  Resp 24  SpO2 94%  Physical Exam  Nursing note and vitals reviewed. Constitutional: She is oriented to person, place, and time. She appears well-developed and well-nourished. No distress.        Awake, alert, nontoxic appearance  HENT:  Head: Atraumatic.  Nose: Nose normal.  Mouth/Throat: Oropharynx is clear and moist. No oropharyngeal exudate.  Eyes: Conjunctivae are normal. Right eye exhibits no discharge. Left eye exhibits no discharge.  Neck: Normal range of motion. Neck supple.  Cardiovascular: Normal rate and regular rhythm.   Pulmonary/Chest: Effort normal. No respiratory distress. She has no wheezes. She exhibits no tenderness.  Abdominal: Soft. There is no tenderness. There is no rebound.  Musculoskeletal: She exhibits no edema and no tenderness.  Lymphadenopathy:    She has no cervical adenopathy.  Neurological: She is alert and oriented to person, place, and time.       Mental status and motor strength appears intact  Skin: Skin is warm. No rash noted.  Psychiatric: She has a normal mood and affect.    ED Course  Procedures (including critical care time)  Labs Reviewed - No data to display No results found.   No diagnosis found.   Date: 07/23/2011  Rate: 86  Rhythm: normal sinus rhythm  QRS Axis: normal  Intervals: normal  ST/T Wave abnormalities: normal  Conduction Disutrbances:none  Narrative Interpretation:   Old EKG Reviewed: unchanged  Results for orders placed during the hospital encounter of 07/23/11  POCT I-STAT, CHEM 8      Component Value Range   Sodium 142  135 - 145 (mEq/L)   Potassium 4.1  3.5 - 5.1 (mEq/L)   Chloride 102  96 - 112 (mEq/L)   BUN 22  6 - 23 (mg/dL)   Creatinine, Ser 1.20 (*) 0.50 - 1.10 (mg/dL)   Glucose, Bld 104 (*) 70 - 99 (mg/dL)   Calcium, Ion 1.19  1.12 - 1.32 (mmol/L)   TCO2 29  0 - 100 (mmol/L)   Hemoglobin 11.9 (*) 12.0 - 15.0 (g/dL)   HCT 35.0 (*) 36.0 - 46.0 (%)  CBC      Component Value Range   WBC 9.7  4.0 - 10.5 (K/uL)   RBC 3.86 (*) 3.87 - 5.11 (MIL/uL)   Hemoglobin 11.7 (*) 12.0 - 15.0 (g/dL)   HCT 35.3 (*) 36.0 - 46.0 (%)   MCV 91.5  78.0 - 100.0 (fL)   MCH 30.3  26.0 - 34.0 (pg)   MCHC 33.1   30.0 - 36.0 (g/dL)   RDW 13.1  11.5 - 15.5 (%)   Platelets 293  150 - 400 (K/uL)  DIFFERENTIAL      Component Value Range   Neutrophils Relative 65  43 - 77 (%)   Neutro Abs 6.3  1.7 - 7.7 (K/uL)   Lymphocytes Relative 24  12 - 46 (%)  Lymphs Abs 2.3  0.7 - 4.0 (K/uL)   Monocytes Relative 9  3 - 12 (%)   Monocytes Absolute 0.9  0.1 - 1.0 (K/uL)   Eosinophils Relative 1  0 - 5 (%)   Eosinophils Absolute 0.1  0.0 - 0.7 (K/uL)   Basophils Relative 1  0 - 1 (%)   Basophils Absolute 0.1  0.0 - 0.1 (K/uL)  URINALYSIS, ROUTINE W REFLEX MICROSCOPIC      Component Value Range   Color, Urine YELLOW  YELLOW    APPearance CLEAR  CLEAR    Specific Gravity, Urine 1.008  1.005 - 1.030    pH 7.5  5.0 - 8.0    Glucose, UA NEGATIVE  NEGATIVE (mg/dL)   Hgb urine dipstick NEGATIVE  NEGATIVE    Bilirubin Urine NEGATIVE  NEGATIVE    Ketones, ur 15 (*) NEGATIVE (mg/dL)   Protein, ur NEGATIVE  NEGATIVE (mg/dL)   Urobilinogen, UA 0.2  0.0 - 1.0 (mg/dL)   Nitrite NEGATIVE  NEGATIVE    Leukocytes, UA NEGATIVE  NEGATIVE   OCCULT BLOOD, POC DEVICE      Component Value Range   Fecal Occult Bld NEGATIVE     Dg Chest 2 View  07/23/2011  *RADIOLOGY REPORT*  Clinical Data: Hemoptysis.  COPD.  CHEST - 2 VIEW  Comparison: 11/10/2009  Findings: Heart size is mildly enlarged.  There is bilateral upper lobe opacities left greater than right.  Within the right upper lobe there is a nodule which measures 1.3 cm.  This appears new when compared with the previous examination.  Large right medial lung base opacity is a chronic finding and is likely related to prominent pericardial fat.  IMPRESSION:  1.  Bilateral upper lobe airspace opacities concerning for infection. 2.  Right upper lobe nodule is new from previous exam.  Recommend further evaluation with CT of the chest.  These results will be called to the ordering clinician or representative by the Radiologist Assistant, and communication documented in the PACS Dashboard.   Original Report Authenticated By: Angelita Ingles, M.D.        MDM  Pt with hemoptysis since this AM, sxs has resolved.  No evidence of anemia. Unsure cause. Pt in NAD, VSS.  Low suspicion for PE.    Plan to obtain CXR, Istat to check for H&H, hemoccult and will continue to monitor.     4:44 PM Chest x-ray shows bilateral upper lobe airspace opacities concerning for infection. There are also a right upper lobe nodule which is new from previous exam. There are recommendations for further evaluation with CT of the chest. Since patient is 78 year old, have hx of COPD and having bilateral lobes infiltrates, I have discussed with my attending who recommends admission for community-acquired pneumonia. Will treat with Rocephin/Zithromax via IV.     7:20 PM i have spoken with [facility], Dr. Roel Cluck who recommend performing chest CTA to r/o postobstructive pna, as pt at risk of cancer.  Former smoker.    Dr. Roel Cluck will see pt in ED for further management. Pt will be admitted.    Domenic Moras, PA-C 07/23/11 1921

## 2011-07-23 NOTE — ED Notes (Signed)
Pt requesting food. PT states she has not eaten all day. AIDET performed

## 2011-07-23 NOTE — Progress Notes (Signed)
ANTIBIOTIC CONSULT NOTE - INITIAL  Pharmacy Consult for vancomycin Indication: pneumonia  No Known Allergies  Patient Measurements: Height: 5' (152.4 cm) Weight: 124 lb 12.5 oz (56.6 kg) IBW/kg (Calculated) : 45.5   Vital Signs: Temp: 97.8 F (36.6 C) (06/07 2151) Temp src: Oral (06/07 2151) BP: 163/56 mmHg (06/07 2151) Pulse Rate: 89  (06/07 2151)  Labs:  Basename 07/23/11 1644 07/23/11 1543  WBC 9.7 --  HGB 11.7* 11.9*  PLT 293 --  LABCREA -- --  CREATININE -- 1.20*   Estimated Creatinine Clearance: 24.1 ml/min (by C-G formula based on Cr of 1.2).  Microbiology: No results found for this or any previous visit (from the past 720 hour(s)).  Medical History: Past Medical History  Diagnosis Date  . Thyroid disease   . Hypertension   . Hyperlipemia   . Multiple allergies   . COPD (chronic obstructive pulmonary disease)     Medications:  Prescriptions prior to admission  Medication Sig Dispense Refill  . amLODipine (NORVASC) 5 MG tablet Take 5 mg by mouth daily.      Marland Kitchen aspirin EC 81 MG tablet Take 81 mg by mouth daily.      Marland Kitchen azelastine (ASTELIN) 137 MCG/SPRAY nasal spray Place 1 spray into the nose 2 (two) times daily. Use in each nostril as directed      . Calcium Carbonate-Vitamin D (CALTRATE 600+D PO) Take 1 tablet by mouth 2 (two) times daily.      . fluticasone (FLONASE) 50 MCG/ACT nasal spray Place 2 sprays into the nose daily.      Marland Kitchen levothyroxine (SYNTHROID, LEVOTHROID) 75 MCG tablet Take 75 mcg by mouth daily.      . Multiple Vitamins-Minerals (CENTRUM SILVER PO) Take 1 tablet by mouth daily.      . nitrofurantoin, macrocrystal-monohydrate, (MACROBID) 100 MG capsule Take 100 mg by mouth 2 (two) times daily.      . simvastatin (ZOCOR) 20 MG tablet Take 20 mg by mouth every evening.      . tiotropium (SPIRIVA) 18 MCG inhalation capsule Place 18 mcg into inhaler and inhale daily.      . valsartan-hydrochlorothiazide (DIOVAN-HCT) 160-12.5 MG per tablet Take 1  tablet by mouth daily.      . Vitamin D, Ergocalciferol, (DRISDOL) 50000 UNITS CAPS Take 50,000 Units by mouth every 30 (thirty) days.       Scheduled:    . amLODipine  5 mg Oral Daily  . azelastine  1 spray Each Nare BID  . azithromycin (ZITHROMAX) 500 MG IVPB  500 mg Intravenous Once  . azithromycin  500 mg Intravenous Q24H  . cefTRIAXone (ROCEPHIN)  IV  1 g Intravenous Once  . fluticasone  2 spray Each Nare Daily  . Fluticasone-Salmeterol  1 puff Inhalation BID  . guaiFENesin  600 mg Oral BID  . ipratropium  0.5 mg Nebulization Q6H  . levothyroxine  75 mcg Oral Q0600  . nitrofurantoin (macrocrystal-monohydrate)  100 mg Oral BID  . piperacillin-tazobactam (ZOSYN)  IV  3.375 g Intravenous Q8H  . piperacillin-tazobactam  3.375 g Intravenous Once  . simvastatin  20 mg Oral QPM  . vancomycin  500 mg Intravenous Q24H   Assessment: 78yo female c/o mild cough productive of bright blood, PCP sent pt to ED, CXR and chest CT both show bilateral PNA, to begin broad-spectrum ABX for CAP with additional coverage given COPD.  Goal of Therapy:  Vancomycin trough level 15-20 mcg/ml  Plan:  Will begin vancomycin 500mg  IV Q24H and  monitor CBC, Cx, levels prn.  [place] PharmD BCPS 07/23/2011,11:56 PM

## 2011-07-24 ENCOUNTER — Encounter (HOSPITAL_COMMUNITY): Payer: Self-pay | Admitting: *Deleted

## 2011-07-24 DIAGNOSIS — I10 Essential (primary) hypertension: Secondary | ICD-10-CM | POA: Diagnosis present

## 2011-07-24 DIAGNOSIS — J189 Pneumonia, unspecified organism: Principal | ICD-10-CM

## 2011-07-24 DIAGNOSIS — R042 Hemoptysis: Secondary | ICD-10-CM

## 2011-07-24 DIAGNOSIS — J449 Chronic obstructive pulmonary disease, unspecified: Secondary | ICD-10-CM

## 2011-07-24 DIAGNOSIS — R0902 Hypoxemia: Secondary | ICD-10-CM

## 2011-07-24 DIAGNOSIS — E039 Hypothyroidism, unspecified: Secondary | ICD-10-CM | POA: Diagnosis present

## 2011-07-24 DIAGNOSIS — E785 Hyperlipidemia, unspecified: Secondary | ICD-10-CM | POA: Diagnosis present

## 2011-07-24 LAB — DIFFERENTIAL
Basophils Relative: 1 % (ref 0–1)
Eosinophils Absolute: 0.1 10*3/uL (ref 0.0–0.7)
Monocytes Relative: 14 % — ABNORMAL HIGH (ref 3–12)
Neutrophils Relative %: 61 % (ref 43–77)

## 2011-07-24 LAB — CBC
Hemoglobin: 11.1 g/dL — ABNORMAL LOW (ref 12.0–15.0)
MCH: 31.1 pg (ref 26.0–34.0)
MCHC: 33.9 g/dL (ref 30.0–36.0)
RDW: 13.1 % (ref 11.5–15.5)

## 2011-07-24 LAB — COMPREHENSIVE METABOLIC PANEL
ALT: 11 U/L (ref 0–35)
AST: 17 U/L (ref 0–37)
Alkaline Phosphatase: 54 U/L (ref 39–117)
CO2: 24 mEq/L (ref 19–32)
Calcium: 9.2 mg/dL (ref 8.4–10.5)
Chloride: 101 mEq/L (ref 96–112)
GFR calc non Af Amer: 48 mL/min — ABNORMAL LOW (ref >90)
Potassium: 3.6 mEq/L (ref 3.5–5.1)
Sodium: 139 mEq/L (ref 135–145)
Total Bilirubin: 0.4 mg/dL (ref 0.3–1.2)

## 2011-07-24 LAB — TSH: TSH: 1.129 u[IU]/mL (ref 0.350–4.500)

## 2011-07-24 LAB — T4, FREE: Free T4: 1.44 ng/dL (ref 0.80–1.80)

## 2011-07-24 LAB — STREP PNEUMONIAE URINARY ANTIGEN: Strep Pneumo Urinary Antigen: NEGATIVE

## 2011-07-24 MED ORDER — SODIUM CHLORIDE 3 % IN NEBU
3.0000 mL | INHALATION_SOLUTION | Freq: Three times a day (TID) | RESPIRATORY_TRACT | Status: DC
  Administered 2011-07-24 – 2011-07-26 (×7): 3 mL via RESPIRATORY_TRACT
  Filled 2011-07-24 (×9): qty 15

## 2011-07-24 MED ORDER — TUBERCULIN PPD 5 UNIT/0.1ML ID SOLN
5.0000 [IU] | Freq: Once | INTRADERMAL | Status: AC
  Administered 2011-07-24: 5 [IU] via INTRADERMAL
  Filled 2011-07-24: qty 0.1

## 2011-07-24 NOTE — Progress Notes (Signed)
Name: Katherine [name] MRN: [id number] DOB: 1918-01-06    LOS: 1  Referring Provider:  Dr. Roel Cluck, [place] Reason for Referral:  Hemoptysis  PULMONARY / CRITICAL CARE MEDICINE  HPI:  78 yo female former smoker admitted on 07/23/2011 with hemoptysis with b/l upper lobe ASD on CT chest..  Had negative PPD 2 years ago.  Hx of COPD on home oxygen.  Events Since Admission:  Subjective: Denies chest pain.  Breathing okay.  Not much cough.  No further blood.  Vital Signs: Temp:  [97.3 F (36.3 C)-98.4 F (36.9 C)] 97.3 F (36.3 C) (06/08 0720) Pulse Rate:  [74-90] 74  (06/08 0720) Resp:  [16-24] 17  (06/08 0720) BP: (131-163)/(38-111) 131/43 mmHg (06/08 0720) SpO2:  [79 %-98 %] 96 % (06/08 0720) Weight:  [124 lb 12.5 oz (56.6 kg)] 124 lb 12.5 oz (56.6 kg) (06/07 2300)   Intake/Output Summary (Last 24 hours) at 07/24/11 1140 Last data filed at 07/24/11 1100  Gross per 24 hour  Intake 1296.25 ml  Output    800 ml  Net 496.25 ml     Physical Examination: General: No distress Neuro:  Awake, alert, NAD HEENT:  No sinus tenderness Neck:  No LAN Cardiovascular: RRR. No m/r/g Lungs:  CTAB, nl wob Abdomen:  Soft, NT, ND, no HSM Musculoskeletal:  Joints wnl Skin:  Skin, scattered echymosis  Dg Chest 2 View  07/23/2011  *RADIOLOGY REPORT*  Clinical Data: Hemoptysis.  COPD.  CHEST - 2 VIEW  Comparison: 11/10/2009  Findings: Heart size is mildly enlarged.  There is bilateral upper lobe opacities left greater than right.  Within the right upper lobe there is a nodule which measures 1.3 cm.  This appears new when compared with the previous examination.  Large right medial lung base opacity is a chronic finding and is likely related to prominent pericardial fat.  IMPRESSION:  1.  Bilateral upper lobe airspace opacities concerning for infection. 2.  Right upper lobe nodule is new from previous exam.  Recommend further evaluation with CT of the chest.  These results will be called to the  ordering clinician or representative by the Radiologist Assistant, and communication documented in the PACS Dashboard.  Original Report Authenticated By: Angelita Ingles, M.D.   Ct Angio Chest W/cm &/or Wo Cm  07/23/2011  *RADIOLOGY REPORT*  Clinical Data: Hemoptysis. Possible right upper lobe nodules seen on today's chest radiograph.  CT ANGIOGRAPHY CHEST  Technique:  Multidetector CT imaging of the chest using the standard protocol during bolus administration of intravenous contrast. Multiplanar reconstructed images including MIPs were obtained and reviewed to evaluate the vascular anatomy.  Contrast: 165mL OMNIPAQUE IOHEXOL 350 MG/ML SOLN  Comparison: Chest radiograph 06/07/2013and CT chest 10/19/2005  Findings: This is a satisfactory evaluation of the pulmonary arterial tree.  No focal filling defects are identified to suggest pulmonary embolism.  There is stable cardiomegaly.  Prominent number and mildly prominent size of some mediastinal lymph nodes appears stable compared to prior CT.  The largest is a 15 mm precarinal lymph node.  10 mm right hilar lymph node is similar to prior study. Left nodal hilar tissue is also stable. There is a prominent right- sided pericardial fat pad.  There is a small amount of fluid density within the pericardial fat pad, and the certain significance.  Negative for pleural or pericardial effusion.  Thoracic aorta is normal in caliber and demonstrates atherosclerotic changes.  There is no aneurysm or dissection.  Moderate centrilobular emphysema is similar  to prior exam.  There is patchy bilateral airspace disease, most prominent in the left upper lobe.  There is focal consolidation in the medial left upper lobe, abutting the mediastinum.  There is more patchy airspace disease in the peripheral left upper lobe.  There is also patchy airspace disease in the right upper lobe.  There is dependent atelectasis in the right lower lobe.  A discrete lung nodule is not identified.   Extensive multilevel degenerative changes of the spine.  No evidence of fracture.  There is mild convex right scoliosis of the upper thoracic spine and mild convex left scoliosis of the lower thoracic spine.  IMPRESSION:  1.  Bilateral upper lobe airspace disease, more prominent on the left than right.  There is consolidation in the medial left upper lobe.  Findings most likely reflect bilateral pneumonia. 2. Negative for pulmonary embolism.  3.  Moderate emphysema. 4.  Stable mild mediastinal and bihilar lymphadenopathy.  Unchanged dating back to 2007. 5.  Cardiomegaly.  Original Report Authenticated By: Curlene Dolphin, M.D.   CBC    Component Value Date/Time   WBC 8.2 07/24/2011 0001   RBC 3.57* 07/24/2011 0001   HGB 11.1* 07/24/2011 0001   HCT 32.7* 07/24/2011 0001   PLT 284 07/24/2011 0001   MCV 91.6 07/24/2011 0001   MCH 31.1 07/24/2011 0001   MCHC 33.9 07/24/2011 0001   RDW 13.1 07/24/2011 0001   LYMPHSABS 1.9 07/24/2011 0001   MONOABS 1.1* 07/24/2011 0001   EOSABS 0.1 07/24/2011 0001   BASOSABS 0.0 07/24/2011 0001    BMET    Component Value Date/Time   NA 139 07/24/2011 0500   K 3.6 07/24/2011 0500   CL 101 07/24/2011 0500   CO2 24 07/24/2011 0500   GLUCOSE 93 07/24/2011 0500   BUN 17 07/24/2011 0500   CREATININE 1.00 07/24/2011 0500   CALCIUM 9.2 07/24/2011 0500   GFRNONAA 48* 07/24/2011 0500   GFRAA 55* 07/24/2011 0500    Lab Results  Component Value Date   ALT 11 07/24/2011   AST 17 07/24/2011   ALKPHOS 54 07/24/2011   BILITOT 0.4 07/24/2011     ASSESSMENT AND PLAN  PULMONARY  A:  Hemoptysis 2nd to PNA.  TB less likely. P: F/u AFB Continue ABx per primary team F/u PPD No indication for bronch at this time F/u CXR  A: COPD P: Continue advair, atrovent  A: Acute on chronic hypoxic respiratory failure P: Titrate oxygen to keep SpO2 > 92%  INFECTIOUS  Cultures: Sputum 6/8 >>> Blood cx 6/8 >>> AFB x3 6/8>>>  Antibiotics: Zosyn 3/7 >>> Vanc 3/7 >>> Azithromycin 3/7 >>>  A:  Possible PNA P:     Cover with antibiotics as above   Will check back on Monday 6/10.  Call if help needed sooner.  Chesley Mires, MD [facility] Pulmonary/Critical Care 07/24/2011, 11:39 AM Pager:  [phone] After 3pm call: [phone]

## 2011-07-24 NOTE — Progress Notes (Signed)
Pt. Was administered hypertonic saline for sputum induction. After tx. Pt. Was still unable to cough anything up. RN present and aware.

## 2011-07-24 NOTE — ED Provider Notes (Signed)
Medical screening examination/treatment/procedure(s) were conducted as a shared visit with non-physician practitioner(s) and myself.  I personally evaluated the patient during the encounter Patient is a 78 year old lady with known COPD who experienced hemoptysis today. She says she's been having trouble with sinus problems recently. She breathed 2 L of oxygen daily exam shows her to be a pleasant elderly lady in no distress her heart sounds are normal her breath sounds are distant there are no rales rhonchi. Chest x-ray shows bilateral upper lobe infiltrates. Impression #1 COPD #2 community-acquired pneumonia. I recommended laboratory workup and hospitalization for immunity acquired pneumonia.  5:03 PM  Date: 07/23/2011  Rate:86  Rhythm: normal sinus rhythm  QRS Axis: normal  Intervals: normal  ST/T Wave abnormalities: normal  Conduction Disutrbances:none  Narrative Interpretation: Normal EKG  Old EKG Reviewed: unchanged      Mylinda Latina III, MD 07/24/11 1350

## 2011-07-24 NOTE — Progress Notes (Signed)
Subjective: 78 year old female, very pleasant and alert.  Admitted for hemoptysis.  She has bilateral upper lobe infiltrates.  Otherwise states she feels relatively well.  PPD placed this morning.  She is on broad-spectrum antibiotics and being followed by pulmonary critical care.  She is also in respiratory isolation  Objective: Weight change:   Intake/Output Summary (Last 24 hours) at 07/24/11 1116 Last data filed at 07/24/11 1100  Gross per 24 hour  Intake 1296.25 ml  Output    800 ml  Net 496.25 ml    Filed Vitals:   07/24/11 0400 07/24/11 0449 07/24/11 0719 07/24/11 0720  BP: 147/55   131/43  Pulse: 75   74  Temp: 98 F (36.7 C)   97.3 F (36.3 C)  TempSrc: Oral   Axillary  Resp: 21   17  Height:      Weight:      SpO2: 92% 94% 98% 96%    General Appearance: Alert, cooperative, no distress, appears stated age Head: Normocephalic, without obvious abnormality, atraumatic Neck: Supple, symmetrical Lungs: Clear to auscultation bilaterally, respirations unlabored Heart: Regular rate and rhythm, S1 and S2 normal, no murmur, rub or gallop Abdomen: Soft, non-tender, bowel sounds active all four quadrants, no masses, no organomegaly Extremities: Extremities normal, atraumatic, no cyanosis or edema Pulses: 2+ and symmetric all extremities Skin: Skin color, texture, turgor normal, scattered ecchymoses.  PPD placed on left forearm with demarcating circle Neuro: CNII-XII intact. Normal strength, sensation and reflexes throughout   Lab Results:  Basename 07/24/11 0500 07/23/11 1543  NA 139 142  K 3.6 4.1  CL 101 102  CO2 24 --  GLUCOSE 93 104*  BUN 17 22  CREATININE 1.00 1.20*  CALCIUM 9.2 --  MG 1.9 --  PHOS 3.5 --    Basename 07/24/11 0500  AST 17  ALT 11  ALKPHOS 54  BILITOT 0.4  PROT 6.3  ALBUMIN 3.1*   No results found for this basename: LIPASE:2,AMYLASE:2 in the last 72 hours  Basename 07/24/11 0001 07/23/11 1644  WBC 8.2 9.7  NEUTROABS 5.0 6.3  HGB  11.1* 11.7*  HCT 32.7* 35.3*  MCV 91.6 91.5  PLT 284 293   No results found for this basename: CKTOTAL:3,CKMB:3,CKMBINDEX:3,TROPONINI:3 in the last 72 hours No components found with this basename: POCBNP:3 No results found for this basename: DDIMER:2 in the last 72 hours No results found for this basename: HGBA1C:2 in the last 72 hours No results found for this basename: CHOL:2,HDL:2,LDLCALC:2,TRIG:2,CHOLHDL:2,LDLDIRECT:2 in the last 72 hours No results found for this basename: TSH,T4TOTAL,FREET3,T3FREE,THYROIDAB in the last 72 hours No results found for this basename: VITAMINB12:2,FOLATE:2,FERRITIN:2,TIBC:2,IRON:2,RETICCTPCT:2 in the last 72 hours  Studies/Results: Dg Chest 2 View  07/23/2011  *RADIOLOGY REPORT*  Clinical Data: Hemoptysis.  COPD.  CHEST - 2 VIEW  Comparison: 11/10/2009  Findings: Heart size is mildly enlarged.  There is bilateral upper lobe opacities left greater than right.  Within the right upper lobe there is a nodule which measures 1.3 cm.  This appears new when compared with the previous examination.  Large right medial lung base opacity is a chronic finding and is likely related to prominent pericardial fat.  IMPRESSION:  1.  Bilateral upper lobe airspace opacities concerning for infection. 2.  Right upper lobe nodule is new from previous exam.  Recommend further evaluation with CT of the chest.  These results will be called to the ordering clinician or representative by the Radiologist Assistant, and communication documented in the PACS Dashboard.  Original Report Authenticated  By: Angelita Ingles, M.D.   Ct Angio Chest W/cm &/or Wo Cm  07/23/2011  *RADIOLOGY REPORT*  Clinical Data: Hemoptysis. Possible right upper lobe nodules seen on today's chest radiograph.  CT ANGIOGRAPHY CHEST  Technique:  Multidetector CT imaging of the chest using the standard protocol during bolus administration of intravenous contrast. Multiplanar reconstructed images including MIPs were obtained  and reviewed to evaluate the vascular anatomy.  Contrast: 136mL OMNIPAQUE IOHEXOL 350 MG/ML SOLN  Comparison: Chest radiograph 06/07/2013and CT chest 10/19/2005  Findings: This is a satisfactory evaluation of the pulmonary arterial tree.  No focal filling defects are identified to suggest pulmonary embolism.  There is stable cardiomegaly.  Prominent number and mildly prominent size of some mediastinal lymph nodes appears stable compared to prior CT.  The largest is a 15 mm precarinal lymph node.  10 mm right hilar lymph node is similar to prior study. Left nodal hilar tissue is also stable. There is a prominent right- sided pericardial fat pad.  There is a small amount of fluid density within the pericardial fat pad, and the certain significance.  Negative for pleural or pericardial effusion.  Thoracic aorta is normal in caliber and demonstrates atherosclerotic changes.  There is no aneurysm or dissection.  Moderate centrilobular emphysema is similar to prior exam.  There is patchy bilateral airspace disease, most prominent in the left upper lobe.  There is focal consolidation in the medial left upper lobe, abutting the mediastinum.  There is more patchy airspace disease in the peripheral left upper lobe.  There is also patchy airspace disease in the right upper lobe.  There is dependent atelectasis in the right lower lobe.  A discrete lung nodule is not identified.  Extensive multilevel degenerative changes of the spine.  No evidence of fracture.  There is mild convex right scoliosis of the upper thoracic spine and mild convex left scoliosis of the lower thoracic spine.  IMPRESSION:  1.  Bilateral upper lobe airspace disease, more prominent on the left than right.  There is consolidation in the medial left upper lobe.  Findings most likely reflect bilateral pneumonia. 2. Negative for pulmonary embolism.  3.  Moderate emphysema. 4.  Stable mild mediastinal and bihilar lymphadenopathy.  Unchanged dating back to 2007.  5.  Cardiomegaly.  Original Report Authenticated By: Curlene Dolphin, M.D.   Medications: Scheduled Meds:   . amLODipine  5 mg Oral Daily  . azelastine  1 spray Each Nare BID  . azithromycin (ZITHROMAX) 500 MG IVPB  500 mg Intravenous Once  . azithromycin  500 mg Intravenous Q24H  . cefTRIAXone (ROCEPHIN)  IV  1 g Intravenous Once  . fluticasone  2 spray Each Nare Daily  . Fluticasone-Salmeterol  1 puff Inhalation BID  . guaiFENesin  600 mg Oral BID  . ipratropium  0.5 mg Nebulization Q6H  . levothyroxine  75 mcg Oral Q0600  . nitrofurantoin (macrocrystal-monohydrate)  100 mg Oral BID  . piperacillin-tazobactam (ZOSYN)  IV  3.375 g Intravenous Q8H  . piperacillin-tazobactam  3.375 g Intravenous Once  . simvastatin  20 mg Oral QPM  . sodium chloride HYPERTONIC  3 mL Nebulization Q8H  . tuberculin  5 Units Intradermal Once  . vancomycin  500 mg Intravenous Q24H   Continuous Infusions:   . sodium chloride 75 mL/hr at 07/24/11 0400   PRN Meds:.albuterol, iohexol  Assessment/Plan: Patient Active Problem List  Diagnoses Date Noted  . CAP (community acquired pneumonia) - patient on broad-spectrum antibiotics, IV Zosyn and vancomycin  and azithromycin.  PPD placed.  Negative pressure room for respiratory isolation for now.  As per pulmonary critical care  07/23/2011  . COPD (chronic obstructive pulmonary disease) - continue current medications  07/23/2011  . Hemoptysis 07/23/2011  . Hypoxia Hypertension - controlled  History of hyperlipidemia Hypothyroidism  07/23/2011     LOS: 1 day   Ebonye Reade NEVILL 07/24/2011, 11:16 AM

## 2011-07-24 NOTE — Plan of Care (Signed)
Problem: Phase I Progression Outcomes Goal: OOB as tolerated unless otherwise ordered Outcome: Completed/Met Date Met:  07/24/11 Unsteady gait; requires assistance to ambulate.  Per patient, she normally uses a walker.  PT consult requested.

## 2011-07-24 NOTE — Progress Notes (Signed)
Administered hypertonic saline for sputum induction as per order.

## 2011-07-25 LAB — LEGIONELLA ANTIGEN, URINE: Legionella Antigen, Urine: NEGATIVE

## 2011-07-25 LAB — TSH: TSH: 1.028 u[IU]/mL (ref 0.350–4.500)

## 2011-07-25 LAB — VITAMIN B12: Vitamin B-12: 611 pg/mL (ref 211–911)

## 2011-07-25 MED ORDER — VANCOMYCIN HCL 500 MG IV SOLR
500.0000 mg | INTRAVENOUS | Status: DC
  Administered 2011-07-25: 500 mg via INTRAVENOUS
  Filled 2011-07-25: qty 500

## 2011-07-25 MED ORDER — WHITE PETROLATUM GEL
Status: AC
  Administered 2011-07-25: 21:00:00
  Filled 2011-07-25: qty 5

## 2011-07-25 NOTE — Progress Notes (Signed)
Subjective: Katherine [name] actually feels great today.  PPD nonreactive so far on left arm.  Pulmonary thinking that this is not likely to be tuberculosis.  On triple antibiotic therapy IV.  No further hemoptysis.  Doing her taxes while resting in bed!!   Objective: Weight change:   Intake/Output Summary (Last 24 hours) at 07/25/11 1324 Last data filed at 07/25/11 0840  Gross per 24 hour  Intake   1495 ml  Output   2000 ml  Net   -505 ml   Filed Vitals:   07/25/11 0758 07/25/11 1000 07/25/11 1154 07/25/11 1200  BP:  169/62  152/52  Pulse:      Temp: 97.8 F (36.6 C)     TempSrc: Oral     Resp:  13  17  Height:      Weight:      SpO2:   95% 95%   General Appearance: Alert, cooperative, no distress, appears stated age  Head: Normocephalic, without obvious abnormality, atraumatic  Neck: Supple, symmetrical  Lungs: Clear to auscultation bilaterally, respirations unlabored  Heart: Regular rate and rhythm, S1 and S2 normal, no murmur, rub or gallop  Abdomen: Soft, non-tender, bowel sounds active all four quadrants, no masses, no organomegaly  Extremities: Extremities normal, atraumatic, no cyanosis or edema  Pulses: 2+ and symmetric all extremities  Skin: Skin color, texture, turgor normal, scattered ecchymoses. PPD placed on left forearm with demarcating circle, no reaction thus far  Neuro: CNII-XII intact. Normal strength, sensation and reflexes throughout    Lab Results:  Basename 07/24/11 0500 07/23/11 1543  NA 139 142  K 3.6 4.1  CL 101 102  CO2 24 --  GLUCOSE 93 104*  BUN 17 22  CREATININE 1.00 1.20*  CALCIUM 9.2 --  MG 1.9 --  PHOS 3.5 --    Basename 07/24/11 0500  AST 17  ALT 11  ALKPHOS 54  BILITOT 0.4  PROT 6.3  ALBUMIN 3.1*   No results found for this basename: LIPASE:2,AMYLASE:2 in the last 72 hours  Basename 07/24/11 0001 07/23/11 1644  WBC 8.2 9.7  NEUTROABS 5.0 6.3  HGB 11.1* 11.7*  HCT 32.7* 35.3*  MCV 91.6 91.5  PLT 284 293   No results  found for this basename: CKTOTAL:3,CKMB:3,CKMBINDEX:3,TROPONINI:3 in the last 72 hours No components found with this basename: POCBNP:3 No results found for this basename: DDIMER:2 in the last 72 hours No results found for this basename: HGBA1C:2 in the last 72 hours No results found for this basename: CHOL:2,HDL:2,LDLCALC:2,TRIG:2,CHOLHDL:2,LDLDIRECT:2 in the last 72 hours  Basename 07/25/11 0620  TSH 1.028  T4TOTAL --  T3FREE --  THYROIDAB --    Basename 07/25/11 0620  VITAMINB12 611  FOLATE --  FERRITIN --  TIBC --  IRON --  RETICCTPCT --    Studies/Results: Dg Chest 2 View  07/23/2011  *RADIOLOGY REPORT*  Clinical Data: Hemoptysis.  COPD.  CHEST - 2 VIEW  Comparison: 11/10/2009  Findings: Heart size is mildly enlarged.  There is bilateral upper lobe opacities left greater than right.  Within the right upper lobe there is a nodule which measures 1.3 cm.  This appears new when compared with the previous examination.  Large right medial lung base opacity is a chronic finding and is likely related to prominent pericardial fat.  IMPRESSION:  1.  Bilateral upper lobe airspace opacities concerning for infection. 2.  Right upper lobe nodule is new from previous exam.  Recommend further evaluation with CT of the chest.  These results  will be called to the ordering clinician or representative by the Radiologist Assistant, and communication documented in the PACS Dashboard.  Original Report Authenticated By: Angelita Ingles, M.D.   Ct Angio Chest W/cm &/or Wo Cm  07/23/2011  *RADIOLOGY REPORT*  Clinical Data: Hemoptysis. Possible right upper lobe nodules seen on today's chest radiograph.  CT ANGIOGRAPHY CHEST  Technique:  Multidetector CT imaging of the chest using the standard protocol during bolus administration of intravenous contrast. Multiplanar reconstructed images including MIPs were obtained and reviewed to evaluate the vascular anatomy.  Contrast: 168mL OMNIPAQUE IOHEXOL 350 MG/ML SOLN   Comparison: Chest radiograph 06/07/2013and CT chest 10/19/2005  Findings: This is a satisfactory evaluation of the pulmonary arterial tree.  No focal filling defects are identified to suggest pulmonary embolism.  There is stable cardiomegaly.  Prominent number and mildly prominent size of some mediastinal lymph nodes appears stable compared to prior CT.  The largest is a 15 mm precarinal lymph node.  10 mm right hilar lymph node is similar to prior study. Left nodal hilar tissue is also stable. There is a prominent right- sided pericardial fat pad.  There is a small amount of fluid density within the pericardial fat pad, and the certain significance.  Negative for pleural or pericardial effusion.  Thoracic aorta is normal in caliber and demonstrates atherosclerotic changes.  There is no aneurysm or dissection.  Moderate centrilobular emphysema is similar to prior exam.  There is patchy bilateral airspace disease, most prominent in the left upper lobe.  There is focal consolidation in the medial left upper lobe, abutting the mediastinum.  There is more patchy airspace disease in the peripheral left upper lobe.  There is also patchy airspace disease in the right upper lobe.  There is dependent atelectasis in the right lower lobe.  A discrete lung nodule is not identified.  Extensive multilevel degenerative changes of the spine.  No evidence of fracture.  There is mild convex right scoliosis of the upper thoracic spine and mild convex left scoliosis of the lower thoracic spine.  IMPRESSION:  1.  Bilateral upper lobe airspace disease, more prominent on the left than right.  There is consolidation in the medial left upper lobe.  Findings most likely reflect bilateral pneumonia. 2. Negative for pulmonary embolism.  3.  Moderate emphysema. 4.  Stable mild mediastinal and bihilar lymphadenopathy.  Unchanged dating back to 2007. 5.  Cardiomegaly.  Original Report Authenticated By: Curlene Dolphin, M.D.    Medications: Scheduled Meds:   . amLODipine  5 mg Oral Daily  . azelastine  1 spray Each Nare BID  . azithromycin  500 mg Intravenous Q24H  . fluticasone  2 spray Each Nare Daily  . Fluticasone-Salmeterol  1 puff Inhalation BID  . guaiFENesin  600 mg Oral BID  . ipratropium  0.5 mg Nebulization Q6H  . levothyroxine  75 mcg Oral Q0600  . nitrofurantoin (macrocrystal-monohydrate)  100 mg Oral BID  . piperacillin-tazobactam (ZOSYN)  IV  3.375 g Intravenous Q8H  . simvastatin  20 mg Oral QPM  . sodium chloride HYPERTONIC  3 mL Nebulization Q8H  . vancomycin  500 mg Intravenous Q24H   Continuous Infusions:   . sodium chloride 75 mL/hr at 07/25/11 0453   PRN Meds:.albuterol  Assessment/Plan:  Patient Active Problem List  Diagnoses  Date Noted  .  CAP (community acquired pneumonia) - patient  broad-spectrum antibiotics, IV Zosyn and vancomycin and azithromycin. PPD placed. Negative pressure room for respiratory isolation for now. As  per pulmonary critical care, quite unlikely to be tuberculosis.  If PPD is negative tomorrow morning, would switch to oral antibiotic therapy with Ceftin and azithromycin and discontinue respiratory isolation 07/23/2011  .  COPD (chronic obstructive pulmonary disease) - continue current medications  07/23/2011  .  Hemoptysis - no further hemoptysis 07/23/2011  Hypoxia - improved Hypertension - controlled  History of hyperlipidemia  Hypothyroidism  07/23/2011      LOS: 2 days   Don Tiu NEVILL 07/25/2011, 1:24 PM

## 2011-07-25 NOTE — Evaluation (Signed)
Physical Therapy Evaluation Patient Details Name: Katherine [name] MRN: [id number] DOB: 04-Oct-1917 Today's Date: 07/25/2011 Time: [id number] PT Time Calculation (min): 24 min  PT Assessment / Plan / Recommendation Clinical Impression  Patient is a 78 yo female admitted with CAP with history of COPD on 2 l/min O2 at home.  Patient very mobile, able to ambulate with RW.  Patient should do well with PT.  Do not anticipate any f/u PT needs or equipment needs.  Will follow acutely for mobility/endurance.    PT Assessment  Patient needs continued PT services    Follow Up Recommendations  No PT follow up    Barriers to Discharge None      lEquipment Recommendations  None recommended by PT    Recommendations for Other Services     Frequency Min 3X/week    Precautions / Restrictions Restrictions Weight Bearing Restrictions: No       Mobility  Bed Mobility Bed Mobility: Sit to Supine;Scooting to HOB Sit to Supine: 4: Min guard;With rail Scooting to [facility]: 5: Supervision Details for Bed Mobility Assistance: Cues for easeir transitions Transfers Transfers: Sit to Stand;Stand to Sit Sit to Stand: 4: Min guard;With upper extremity assist;From bed Stand to Sit: 4: Min guard;With upper extremity assist;To bed Details for Transfer Assistance: Cues for safe hand placement Ambulation/Gait Ambulation/Gait Assistance: 4: Min guard Ambulation Distance (Feet): 6 Feet Assistive device: Rolling walker Ambulation/Gait Assistance Details: Patient had been sitting in recliner most of morning.  Limited ambulation today. Gait Pattern: Step-through pattern;Decreased stride length;Trunk flexed General Gait Details: Patient safely utilizes RW.  Ambulated 6' forward and 6' backward.  Cues to look up during gait.    Exercises     PT Diagnosis: Difficulty walking;Generalized weakness  PT Problem List: Decreased strength;Decreased activity tolerance;Decreased mobility;Cardiopulmonary status limiting  activity PT Treatment Interventions: DME instruction;Gait training;Functional mobility training;Therapeutic activities;Patient/family education   PT Goals Acute Rehab PT Goals PT Goal Formulation: With patient Time For Goal Achievement: 08/01/11 Potential to Achieve Goals: Good Pt will go Supine/Side to Sit: Independently;with HOB 0 degrees PT Goal: Supine/Side to Sit - Progress: Goal set today Pt will go Sit to Supine/Side: Independently;with HOB 0 degrees PT Goal: Sit to Supine/Side - Progress: Goal set today Pt will go Sit to Stand: with modified independence;with upper extremity assist PT Goal: Sit to Stand - Progress: Goal set today Pt will Ambulate: >150 feet;with modified independence;with rolling walker PT Goal: Ambulate - Progress: Goal set today  Visit Information  Last PT Received On: 07/25/11 Assistance Needed: +1    Subjective Data  Subjective: "I'm doing very well today" Patient Stated Goal: To go home   Prior [place] Lives With: Alone Available Help at Discharge: Family (nephew) Type of Home: Independent living facility Home Access: Level entry Home Layout: One level Biochemist, clinical: Standard Bathroom Accessibility: Yes How Accessible: Accessible via walker Home Adaptive Equipment: Walker - four wheeled;Straight cane Prior Function Level of Independence: Independent with assistive device(s) Driving: Yes Vocation: 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    Cognition  Overall Cognitive Status: Appears within functional limits for tasks assessed/performed Arousal/Alertness: Awake/alert Orientation Level: Oriented X4 / Intact Behavior During Session: [facility] for tasks performed    Extremity/Trunk Assessment Right Upper Extremity Assessment RUE ROM/Strength/Tone: [facility] for tasks assessed Left Upper Extremity Assessment LUE ROM/Strength/Tone: [facility] for tasks assessed Right Lower Extremity Assessment RLE ROM/Strength/Tone: [facility] for  tasks assessed Left Lower Extremity Assessment LLE ROM/Strength/Tone: [facility] for tasks assessed   Balance  End of Session PT - End of Session Equipment Utilized During Treatment: Gait belt;Oxygen Activity Tolerance: Patient limited by fatigue Patient left: in bed;with call bell/phone within reach Nurse Communication: Mobility status   Despina Pole 07/25/2011, 1:05 PM Carita Pian. Sanjuana Kava, [place] Pager [phone]

## 2011-07-26 ENCOUNTER — Inpatient Hospital Stay (HOSPITAL_COMMUNITY): Payer: Medicare Other

## 2011-07-26 DIAGNOSIS — I10 Essential (primary) hypertension: Secondary | ICD-10-CM

## 2011-07-26 DIAGNOSIS — E039 Hypothyroidism, unspecified: Secondary | ICD-10-CM

## 2011-07-26 MED ORDER — CEFUROXIME AXETIL 500 MG PO TABS
500.0000 mg | ORAL_TABLET | Freq: Two times a day (BID) | ORAL | Status: DC
  Administered 2011-07-26: 500 mg via ORAL
  Filled 2011-07-26 (×6): qty 1

## 2011-07-26 MED ORDER — NITROFURANTOIN MONOHYD MACRO 100 MG PO CAPS
100.0000 mg | ORAL_CAPSULE | Freq: Every day | ORAL | Status: DC
  Administered 2011-07-26: 100 mg via ORAL
  Filled 2011-07-26: qty 1

## 2011-07-26 MED ORDER — AZITHROMYCIN 250 MG PO TABS
250.0000 mg | ORAL_TABLET | Freq: Every day | ORAL | Status: DC
  Administered 2011-07-26: 250 mg via ORAL
  Filled 2011-07-26 (×2): qty 1

## 2011-07-26 NOTE — Progress Notes (Signed)
Subjective: Nonproductive cough, no further hemopotysis.  Feels well.  PPD negative  Objective: Vital signs in last 24 hours: Temp:  [97.8 F (36.6 C)-99.7 F (37.6 C)] 98.6 F (37 C) (06/10 0400) Pulse Rate:  [64-91] 81  (06/10 0500) Resp:  [13-23] 18  (06/10 0500) BP: (101-169)/(42-64) 153/42 mmHg (06/10 0500) SpO2:  [89 %-98 %] 95 % (06/10 0532) Weight change:  Last BM Date: 07/22/11  Intake/Output from previous day: 06/09 0701 - 06/10 0700 In: 1917.5 [P.O.:480; I.V.:975; IV Piggyback:462.5] Out: 2625 [Urine:2625] Intake/Output this shift:    General appearance: alert and cooperative Resp: clear to auscultation bilaterally Cardio: regular rate and rhythm, S1, S2 normal, no murmur, click, rub or gallop GI: soft, non-tender; bowel sounds normal; no masses,  no organomegaly Extremities: extremities normal, atraumatic, no cyanosis or edema  Lab Results:  Basename 07/24/11 0001 07/23/11 1644  WBC 8.2 9.7  HGB 11.1* 11.7*  HCT 32.7* 35.3*  PLT 284 293   BMET  Basename 07/24/11 0500 07/23/11 1543  NA 139 142  K 3.6 4.1  CL 101 102  CO2 24 --  GLUCOSE 93 104*  BUN 17 22  CREATININE 1.00 1.20*  CALCIUM 9.2 --    Studies/Results: No results found.  Medications: I have reviewed the patient's current medications.  Assessment/Plan: Principal Problem:  *CAP (community acquired pneumonia)  Improved.  Change to po antibiotics.  Ambulate Active Problems:  COPD (chronic obstructive pulmonary disease) stable on home meds, O2  Hemoptysis  Doubt TB, PPD negative.  AFB smears pending  Hypertension ok  Disposition.  Transfer to floor, hope to discharge in am   LOS: 3 days   Katherine [name] JOSEPH 07/26/2011, 7:06 AM

## 2011-07-26 NOTE — Progress Notes (Signed)
Received patient frrom 2900 via wheelchair, pt., is alert and cooperative, no complaints at this time.  Oriented to room, and use of call light, phone and call bell within reach.  Oxygen on at 2l/[place].

## 2011-07-26 NOTE — Care Management Note (Addendum)
    Page 1 of 1   07/27/2011     3:30:26 PM   CARE MANAGEMENT NOTE 07/27/2011  Patient:  RANASIA, MASSARELLI   Account Number:  [id number]  Date Initiated:  07/26/2011  Documentation initiated by:  Elissa Hefty  Subjective/Objective Assessment:   adm w pneumonia     Action/Plan:   [facility] pcp dr Jenny Reichmann [place]   Anticipated DC Date:     Anticipated DC Plan:        DC Planning Services  CM consult      Choice offered to / List presented to:             Status of service:  Completed, signed off Medicare Important Message given?   (If response is "NO", the following Medicare IM given date fields will be blank) Date Medicare IM given:   Date Additional Medicare IM given:    Discharge Disposition:  ASSISTED LIVING  Per UR Regulation:  Reviewed for med. necessity/level of care/duration of stay  If discussed at [place] of Stay Meetings, dates discussed:    Comments:  07/27/11 Lars Pinks, RN, BSN [place].  6/10 debbie dowell rn,bsn [phone] will make sw ref in case needed for higher level of care

## 2011-07-26 NOTE — Progress Notes (Signed)
Physical Therapy Treatment Patient Details Name: Katherine [name] MRN: [id number] DOB: 11-Jul-1917 Today's Date: 07/26/2011 Time: [id number] PT Time Calculation (min): 25 min  PT Assessment / Plan / Recommendation Comments on Treatment Session  Pt progressing well with activity but still cautious with mobility and with decreased speed and endurance, spoke with her about going to assisted living portion of [facility] for a short stay.  She is in agreement with this as long as there is a bed available there.  PT will continue to follow.    Follow Up Recommendations  Other (comment);Home health PT (ALF at [facility])    Barriers to Discharge        Equipment Recommendations  None recommended by PT    Recommendations for Other Services    Frequency Min 3X/week   Plan Discharge plan needs to be updated;Frequency remains appropriate    Precautions / Restrictions Precautions Precautions: Other (comment) (DNR) Restrictions Weight Bearing Restrictions: No   Pertinent Vitals/Pain No c/o pain    Mobility  Bed Mobility Bed Mobility: Sit to Supine;Supine to Sit Supine to Sit: 5: Supervision Sit to Supine: 5: Supervision Details for Bed Mobility Assistance: no cues needed Transfers Transfers: Sit to Stand;Stand to Sit Sit to Stand: 5: Supervision;From bed;From toilet;With upper extremity assist Stand to Sit: 5: Supervision;To toilet;To bed;With upper extremity assist Details for Transfer Assistance: Cues for safe hand placement Ambulation/Gait Ambulation/Gait Assistance: 4: Min guard Ambulation Distance (Feet): 400 Feet Assistive device: Rolling walker Ambulation/Gait Assistance Details: Pt with decreased gait speed and occasional unsteadiness with turning Gait Pattern: Step-to pattern;Decreased stride length;Trunk flexed Stairs: No Wheelchair Mobility Wheelchair Mobility: No     PT Goals Acute Rehab PT Goals PT Goal Formulation: With patient Time For Goal  Achievement: 08/01/11 Potential to Achieve Goals: Good Pt will go Supine/Side to Sit: Independently;with HOB 0 degrees PT Goal: Supine/Side to Sit - Progress: Progressing toward goal Pt will go Sit to Supine/Side: Independently;with HOB 0 degrees PT Goal: Sit to Supine/Side - Progress: Progressing toward goal Pt will go Sit to Stand: with modified independence;with upper extremity assist PT Goal: Sit to Stand - Progress: Progressing toward goal Pt will Ambulate: >150 feet;with modified independence;with rolling walker PT Goal: Ambulate - Progress: Progressing toward goal  Visit Information  Last PT Received On: 07/26/11 Assistance Needed: +1    Subjective Data  Subjective: I hope I get to go home tomorrow but I can't take those strong antibiotic pills Patient Stated Goal: to return home   Cognition  Overall Cognitive Status: Appears within functional limits for tasks assessed/performed Arousal/Alertness: Awake/alert Orientation Level: Oriented X4 / Intact Behavior During Session: [facility] for tasks performed    Balance  Balance Balance Assessed: Yes Dynamic Standing Balance Dynamic Standing - Balance Support: Left upper extremity supported;During functional activity Dynamic Standing - Level of Assistance: 5: Stand by assistance Dynamic Standing - Comments: Pt needs support of at least one UE while standing, unsteady without support.  Maintained standing for functional activity for 3 min and 1 min  End of Session PT - End of Session Equipment Utilized During Treatment: Gait belt;Oxygen Activity Tolerance: Patient tolerated treatment well Patient left: in bed;with call bell/phone within reach Nurse Communication: Mobility status;Other (comment) (d/c plan)   Leighton Roach, PT  Acute Rehab Services  [phone]  Leighton Roach 07/26/2011, 4:05 PM

## 2011-07-26 NOTE — Progress Notes (Signed)
Name: Katherine [name] MRN: [id number] DOB: [birth year]    LOS: 3  Referring Provider:  Dr. Roel Cluck, [place] Reason for Referral:  Hemoptysis  PULMONARY / CRITICAL CARE MEDICINE  HPI:  78 yo female former smoker admitted on 07/23/2011 with hemoptysis with b/l upper lobe ASD on CT chest..  Had negative PPD 2 years ago.  Hx of COPD on home oxygen.  Events Since Admission:  Subjective: Denies chest pain.  Breathing okay.  Not much cough.  No further blood.  Vital Signs: Temp:  [97.8 F (36.6 C)-99.7 F (37.6 C)] 97.8 F (36.6 C) (06/10 0800) Pulse Rate:  [64-92] 92  (06/10 0800) Resp:  [13-23] 23  (06/10 0800) BP: (101-169)/(42-64) 124/59 mmHg (06/10 0800) SpO2:  [88 %-98 %] 88 % (06/10 0800)   Intake/Output Summary (Last 24 hours) at 07/26/11 0857 Last data filed at 07/26/11 0800  Gross per 24 hour  Intake 2127.5 ml  Output   2825 ml  Net -697.5 ml     Physical Examination: General: No distress Neuro:  Awake, alert, NAD HEENT:  No sinus tenderness Neck:  No LAN Cardiovascular: RRR. No m/r/g Lungs:  CTAB, nl wob Abdomen:  Soft, NT, ND, no HSM Musculoskeletal:  Joints wnl Skin:  Skin, scattered echymosis  Dg Chest 2 View  07/26/2011  *RADIOLOGY REPORT*  Clinical Data: Bilateral upper lobe pneumonia.  CHEST - 2 VIEW  Comparison: 07/23/2011.  Findings: Bilateral upper lobe airspace disease was better demonstrated on the prior chest CT and chest radiograph.  In both upper lobes, particularly the left upper lobe, there has been significant improvement since 07/23/2011, suggesting a component of resolved pulmonary edema.  Pneumonia typically does not show such rapid improvement.  Cardiomediastinal contours are unchanged with prominent right pericardial fat pad.  IMPRESSION: Improving bilateral upper lobe airspace disease.  Otherwise stable appearance of the chest.  Original Report Authenticated By: Dereck Ligas, M.D.   CBC    Component Value Date/Time   WBC 8.2  07/24/2011 0001   RBC 3.57* 07/24/2011 0001   HGB 11.1* 07/24/2011 0001   HCT 32.7* 07/24/2011 0001   PLT 284 07/24/2011 0001   MCV 91.6 07/24/2011 0001   MCH 31.1 07/24/2011 0001   MCHC 33.9 07/24/2011 0001   RDW 13.1 07/24/2011 0001   LYMPHSABS 1.9 07/24/2011 0001   MONOABS 1.1* 07/24/2011 0001   EOSABS 0.1 07/24/2011 0001   BASOSABS 0.0 07/24/2011 0001   BMET    Component Value Date/Time   NA 139 07/24/2011 0500   K 3.6 07/24/2011 0500   CL 101 07/24/2011 0500   CO2 24 07/24/2011 0500   GLUCOSE 93 07/24/2011 0500   BUN 17 07/24/2011 0500   CREATININE 1.00 07/24/2011 0500   CALCIUM 9.2 07/24/2011 0500   GFRNONAA 48* 07/24/2011 0500   GFRAA 55* 07/24/2011 0500   Lab Results  Component Value Date   ALT 11 07/24/2011   AST 17 07/24/2011   ALKPHOS 54 07/24/2011   BILITOT 0.4 07/24/2011     ASSESSMENT AND PLAN  PULMONARY  A:  Hemoptysis 2nd to PNA.  TB less likely. P: AFB pending Agree with abx changes PPD negative No indication for bronch at this time  A: COPD P: Continue advair, atrovent.  A: Acute on chronic hypoxic respiratory failure P: Titrate oxygen to keep SpO2 > 92%  INFECTIOUS  Cultures: Sputum 6/8 >>>NTD Blood cx 6/8 >>>NTD AFB x3 6/8>>>NTD  Antibiotics: Zosyn 3/7 >>>6/9 Vanc 3/7 >>>6/9 Azithromycin 3/7 >>> Ceftin 6/9>>> Nitrofurantoin  6/9>>>  A:  Possible PNA P:   Cover with antibiotics as above  Less likely to be TB, no need for bronch, no further hemoptysis.  PCCM will sign off, please call back if needed.  Rush Farmer, M.D. [facility] Pulmonary/Critical Care Medicine. Pager: [phone]. After hours pager: [phone].

## 2011-07-27 MED ORDER — NITROFURANTOIN MONOHYD MACRO 100 MG PO CAPS: 100.0000 mg | ORAL_CAPSULE | Freq: Every day | ORAL | Status: DC

## 2011-07-27 MED ORDER — MOXIFLOXACIN HCL 400 MG PO TABS
400.0000 mg | ORAL_TABLET | Freq: Every day | ORAL | Status: DC
  Filled 2011-07-27 (×2): qty 1

## 2011-07-27 MED ORDER — POLYETHYLENE GLYCOL 3350 17 G PO PACK
17.0000 g | PACK | Freq: Every day | ORAL | Status: DC
  Filled 2011-07-27: qty 1

## 2011-07-27 MED ORDER — POLYETHYLENE GLYCOL 3350 17 G PO PACK: 17.0000 g | PACK | Freq: Every day | ORAL | Status: AC | PRN

## 2011-07-27 MED ORDER — MOXIFLOXACIN HCL 400 MG PO TABS: 400.0000 mg | ORAL_TABLET | Freq: Every day | ORAL | Status: AC

## 2011-07-27 MED ORDER — MOXIFLOXACIN HCL 400 MG PO TABS
400.0000 mg | ORAL_TABLET | Freq: Every day | ORAL | Status: DC
  Administered 2011-07-27: 400 mg via ORAL
  Filled 2011-07-27: qty 1

## 2011-07-27 NOTE — Discharge Summary (Signed)
Physician Discharge Summary  Patient ID: Katherine [name] MRN: [id number] DOB/AGE: 1917/12/17 78 y.o.  Admit date: 07/23/2011 Discharge date: 07/27/2011  Admission Diagnoses: Community acquired pneumonia Hemoptysis COPD Hypothyroidism Hyperlipidemia Hypertension  Discharge Diagnoses:  Principal Problem:  *CAP (community acquired pneumonia) Active Problems:  COPD (chronic obstructive pulmonary disease)  Hemoptysis  Hypoxia  Hypothyroidism  Hyperlipidemia  Hypertension   Discharged Condition: good  Hospital Course: The patient was admitted on June 7 after she had had an episode of hemoptysis the morning of admission. Chest x-ray in the emergency room showed bilateral upper lobe pneumonia and a CT scan of the chest was done showing COPD changes and bilateral upper lobe pneumonia but no PE or mass. The patient's white blood cell count was normal at 9.7. The patient was admitted and treated with vancomycin and Zosyn and azithromycin which he tolerated well despite later we learned that she had a distant history of penicillin allergy. She had some generalized weakness and cough but no more hemoptysis during hospitalization. She did have some productive cough. She was seen by pulmonary and critical care who felt she had community-acquired pneumonia. She was ruled out for TB with PPD which was negative and AFB smears and culture which are pending. Sputum culture was pending. A followup chest x-ray on June 10 showed improving bilateral upper lobe airspace disease. The patient was seen by physical therapy on the day prior to discharge and did well. A short course of home physical therapy was recommended. We placed the patient with cefuroxime and azithromycin but she did not tolerate cefuroxime and she vomited afterwards so she was switched to Avelox to complete as an outpatient. She has some constipation and MiraLAX was added. Her blood pressure remained stable  Consults: pulmonary/intensive  care  Significant Diagnostic Studies: labs: As above and radiology: CXR: Bilateral upper lobe airspace disease and CT scan: Bilateral upper lobe airspace disease more prominent on the left , negative for pulmonary and cardiomegalyembolism, marked COPD  Treatments: antibiotics: vancomycin, Zosyn, azithromycin and Avelox   Discharge Exam: Blood pressure 139/67, pulse 69, temperature 98.6 F (37 C), temperature source Oral, resp. rate 17, height 5' (1.524 m), weight 56.6 kg (124 lb 12.5 oz), SpO2 93.00%. General appearance: alert and cooperative Resp: clear to auscultation bilaterally  Disposition:  assisted-living at friend's home [place]   Medication List  As of 07/27/2011  7:49 AM   TAKE these medications         amLODipine 5 MG tablet   Commonly known as: NORVASC   Take 5 mg by mouth daily.      aspirin EC 81 MG tablet   Take 81 mg by mouth daily.      azelastine 137 MCG/SPRAY nasal spray   Commonly known as: ASTELIN   Place 1 spray into the nose 2 (two) times daily. Use in each nostril as directed      CALTRATE 600+D PO   Take 1 tablet by mouth 2 (two) times daily.      CENTRUM SILVER PO   Take 1 tablet by mouth daily.      fluticasone 50 MCG/ACT nasal spray   Commonly known as: FLONASE   Place 2 sprays into the nose daily.      levothyroxine 75 MCG tablet   Commonly known as: SYNTHROID, LEVOTHROID   Take 75 mcg by mouth daily.      moxifloxacin 400 MG tablet   Commonly known as: AVELOX   Take 1 tablet (400 mg total) by  mouth daily. 1 tablet daily at 8 am      nitrofurantoin (macrocrystal-monohydrate) 100 MG capsule   Commonly known as: MACROBID   Take 1 capsule (100 mg total) by mouth daily.      polyethylene glycol packet   Commonly known as: MIRALAX / GLYCOLAX   Take 17 g by mouth daily as needed.      simvastatin 20 MG tablet   Commonly known as: ZOCOR   Take 20 mg by mouth every evening.      tiotropium 18 MCG inhalation capsule   Commonly known as:  SPIRIVA   Place 18 mcg into inhaler and inhale daily.      valsartan-hydrochlorothiazide 160-12.5 MG per tablet   Commonly known as: DIOVAN-HCT   Take 1 tablet by mouth daily.      Vitamin D (Ergocalciferol) 50000 UNITS Caps   Commonly known as: DRISDOL   Take 50,000 Units by mouth every 30 (thirty) days.           Follow-up Information    Follow up with Irven Shelling, MD in 7 days.   Contact information:   [address], [address] [place] [employer] [place], [place]. [place] [phone]          Signed: Irven Shelling 07/27/2011, 7:49 AM

## 2011-07-27 NOTE — Progress Notes (Signed)
Report called to [facility].  Pt anxious to go, has friend waiting to take her to facility.

## 2011-07-27 NOTE — Clinical Social Work Placement (Signed)
    Clinical Social Work Department CLINICAL SOCIAL WORK PLACEMENT NOTE 07/27/2011  Patient:  Katherine [name], Katherine [name]  Account Number:  [id number] Admit date:  07/23/2011  Clinical Social Worker:  Lehman Prom  Date/time:  07/27/2011 12:37 PM  Clinical Social Work is seeking post-discharge placement for this patient at the following level of care:   ASSISTED LIVING/REST HOME   (*CSW will update this form in Epic as items are completed)   07/27/2011  Patient/family provided with [place] Department of Clinical Social Work's list of facilities offering this level of care within the geographic area requested by the patient (or if unable, by the patient's family).  07/27/2011  Patient/family informed of their freedom to choose among providers that offer the needed level of care, that participate in Medicare, Medicaid or managed care program needed by the patient, have an available bed and are willing to accept the patient.  07/27/2011  Patient/family informed of [facility]' ownership interest in [facility], as well as of the fact that they are under no obligation to receive care at this facility.  PASARR submitted to EDS on 07/27/2011 PASARR number received from [employer] on 07/27/2011  FL2 transmitted to all facilities in geographic area requested by pt/family on  07/27/2011 FL2 transmitted to all facilities within larger geographic area on   Patient informed that his/her managed care company has contracts with or will negotiate with  certain facilities, including the following:     Patient/family informed of bed offers received:  07/27/2011 Patient chooses bed at [facility] ALF Physician recommends and patient chooses bed at  ALF  Patient to be transferred to [facility]F on  07/27/2011 Patient to be transferred to facility by Pt's nephew.   The following physician request were entered in Epic:   Additional Comments:

## 2011-07-27 NOTE — Clinical Social Work Note (Signed)
Pt is ready for discharge to [facility]. Facility is ready to accept pt as they have received the discharge summary and FL2. Pt is agreeable to discharge plan. Pt's nephew will provide transportation to facility. ALF has PT available for pt. CSW is signing off as no further needs identified.   Darden Dates, MSW, [place]

## 2011-07-27 NOTE — Clinical Social Work Psychosocial (Signed)
     Clinical Social Work Department BRIEF PSYCHOSOCIAL ASSESSMENT 07/27/2011  Patient:  Katherine [name], Katherine [name]     Account Number:  [id number]     Admit date:  07/23/2011  Clinical Social Worker:  Lehman Prom  Date/Time:  07/27/2011 12:18 PM  Referred by:  Physician  Date Referred:  07/27/2011 Referred for  Other - See comment   Other Referral:   Pt is a resident of the [place] at [facility] and has an interest in the ALF short term prior to returning home.   Interview type:  Patient Other interview type:    PSYCHOSOCIAL DATA Living Status:  ALONE Admitted from facility:  [place] Level of care:  Independent Living Primary support name:  Thomasena Edis Primary support relationship to patient:  FAMILY Degree of support available:   Supportive. [phone].    CURRENT CONCERNS Current Concerns  Post-Acute Placement   Other Concerns:    SOCIAL WORK ASSESSMENT / PLAN CSW met with pt to address discharge plan. PT is not recommending any follow up. RNCM is aware. Pt shared that she lives in the [place] living apartment at [facility]. Pt reported that she is interested in the ALF short term prior to returning home to her indepentdently living apartment. Pt also shared that her nephew will be providing transportation. CSW contacted Narda Rutherford at [facility] and sent referral for ALF. CSW is awaiting a response. CSW will continue to follow to facilitate discharge.   Assessment/plan status:  Psychosocial Support/Ongoing Assessment of Needs Other assessment/ plan:   Information/referral to community resources:   None requested. Pt lives at a facility and has access to resources if needed.    PATIENTS/FAMILYS RESPONSE TO PLAN OF CARE: Pt was alert and oriented and very pleasant as well. Pt shared that she would like to go to ALF, but if it is not available, pt is agreeable to returning home.

## 2011-07-28 DIAGNOSIS — J158 Pneumonia due to other specified bacteria: Secondary | ICD-10-CM | POA: Diagnosis not present

## 2011-07-28 DIAGNOSIS — J40 Bronchitis, not specified as acute or chronic: Secondary | ICD-10-CM | POA: Diagnosis not present

## 2011-07-28 DIAGNOSIS — M6281 Muscle weakness (generalized): Secondary | ICD-10-CM | POA: Diagnosis not present

## 2011-07-28 DIAGNOSIS — R269 Unspecified abnormalities of gait and mobility: Secondary | ICD-10-CM | POA: Diagnosis not present

## 2011-07-28 LAB — CULTURE, RESPIRATORY W GRAM STAIN

## 2011-07-29 DIAGNOSIS — J158 Pneumonia due to other specified bacteria: Secondary | ICD-10-CM | POA: Diagnosis not present

## 2011-07-29 DIAGNOSIS — J40 Bronchitis, not specified as acute or chronic: Secondary | ICD-10-CM | POA: Diagnosis not present

## 2011-07-29 DIAGNOSIS — M6281 Muscle weakness (generalized): Secondary | ICD-10-CM | POA: Diagnosis not present

## 2011-07-29 DIAGNOSIS — R269 Unspecified abnormalities of gait and mobility: Secondary | ICD-10-CM | POA: Diagnosis not present

## 2011-07-30 DIAGNOSIS — M6281 Muscle weakness (generalized): Secondary | ICD-10-CM | POA: Diagnosis not present

## 2011-07-30 DIAGNOSIS — R269 Unspecified abnormalities of gait and mobility: Secondary | ICD-10-CM | POA: Diagnosis not present

## 2011-07-30 DIAGNOSIS — J158 Pneumonia due to other specified bacteria: Secondary | ICD-10-CM | POA: Diagnosis not present

## 2011-07-30 DIAGNOSIS — J40 Bronchitis, not specified as acute or chronic: Secondary | ICD-10-CM | POA: Diagnosis not present

## 2011-07-30 LAB — CULTURE, BLOOD (ROUTINE X 2)
Culture  Setup Time: 201306081325
Culture  Setup Time: 201306081325
Culture: NO GROWTH
Culture: NO GROWTH

## 2011-08-02 DIAGNOSIS — J158 Pneumonia due to other specified bacteria: Secondary | ICD-10-CM | POA: Diagnosis not present

## 2011-08-02 DIAGNOSIS — R269 Unspecified abnormalities of gait and mobility: Secondary | ICD-10-CM | POA: Diagnosis not present

## 2011-08-02 DIAGNOSIS — J40 Bronchitis, not specified as acute or chronic: Secondary | ICD-10-CM | POA: Diagnosis not present

## 2011-08-02 DIAGNOSIS — M6281 Muscle weakness (generalized): Secondary | ICD-10-CM | POA: Diagnosis not present

## 2011-08-03 DIAGNOSIS — R269 Unspecified abnormalities of gait and mobility: Secondary | ICD-10-CM | POA: Diagnosis not present

## 2011-08-03 DIAGNOSIS — M6281 Muscle weakness (generalized): Secondary | ICD-10-CM | POA: Diagnosis not present

## 2011-08-03 DIAGNOSIS — J40 Bronchitis, not specified as acute or chronic: Secondary | ICD-10-CM | POA: Diagnosis not present

## 2011-08-03 DIAGNOSIS — J158 Pneumonia due to other specified bacteria: Secondary | ICD-10-CM | POA: Diagnosis not present

## 2011-08-04 DIAGNOSIS — R279 Unspecified lack of coordination: Secondary | ICD-10-CM | POA: Diagnosis not present

## 2011-08-04 DIAGNOSIS — R488 Other symbolic dysfunctions: Secondary | ICD-10-CM | POA: Diagnosis not present

## 2011-08-04 DIAGNOSIS — M6281 Muscle weakness (generalized): Secondary | ICD-10-CM | POA: Diagnosis not present

## 2011-08-04 DIAGNOSIS — R269 Unspecified abnormalities of gait and mobility: Secondary | ICD-10-CM | POA: Diagnosis not present

## 2011-08-05 DIAGNOSIS — M6281 Muscle weakness (generalized): Secondary | ICD-10-CM | POA: Diagnosis not present

## 2011-08-05 DIAGNOSIS — R279 Unspecified lack of coordination: Secondary | ICD-10-CM | POA: Diagnosis not present

## 2011-08-05 DIAGNOSIS — R269 Unspecified abnormalities of gait and mobility: Secondary | ICD-10-CM | POA: Diagnosis not present

## 2011-08-05 DIAGNOSIS — R488 Other symbolic dysfunctions: Secondary | ICD-10-CM | POA: Diagnosis not present

## 2011-08-06 DIAGNOSIS — M6281 Muscle weakness (generalized): Secondary | ICD-10-CM | POA: Diagnosis not present

## 2011-08-06 DIAGNOSIS — R488 Other symbolic dysfunctions: Secondary | ICD-10-CM | POA: Diagnosis not present

## 2011-08-06 DIAGNOSIS — R279 Unspecified lack of coordination: Secondary | ICD-10-CM | POA: Diagnosis not present

## 2011-08-06 DIAGNOSIS — R269 Unspecified abnormalities of gait and mobility: Secondary | ICD-10-CM | POA: Diagnosis not present

## 2011-08-10 DIAGNOSIS — R279 Unspecified lack of coordination: Secondary | ICD-10-CM | POA: Diagnosis not present

## 2011-08-10 DIAGNOSIS — M6281 Muscle weakness (generalized): Secondary | ICD-10-CM | POA: Diagnosis not present

## 2011-08-10 DIAGNOSIS — R269 Unspecified abnormalities of gait and mobility: Secondary | ICD-10-CM | POA: Diagnosis not present

## 2011-08-10 DIAGNOSIS — R488 Other symbolic dysfunctions: Secondary | ICD-10-CM | POA: Diagnosis not present

## 2011-08-11 DIAGNOSIS — R488 Other symbolic dysfunctions: Secondary | ICD-10-CM | POA: Diagnosis not present

## 2011-08-11 DIAGNOSIS — R269 Unspecified abnormalities of gait and mobility: Secondary | ICD-10-CM | POA: Diagnosis not present

## 2011-08-11 DIAGNOSIS — R279 Unspecified lack of coordination: Secondary | ICD-10-CM | POA: Diagnosis not present

## 2011-08-11 DIAGNOSIS — M6281 Muscle weakness (generalized): Secondary | ICD-10-CM | POA: Diagnosis not present

## 2011-08-18 ENCOUNTER — Encounter: Payer: Self-pay | Admitting: Pulmonary Disease

## 2011-08-20 ENCOUNTER — Encounter: Payer: Self-pay | Admitting: Pulmonary Disease

## 2011-08-20 ENCOUNTER — Ambulatory Visit (INDEPENDENT_AMBULATORY_CARE_PROVIDER_SITE_OTHER): Payer: Medicare Other | Admitting: Pulmonary Disease

## 2011-08-20 VITALS — BP 130/70 | HR 81 | Temp 98.3°F | Ht 60.0 in | Wt 126.8 lb

## 2011-08-20 DIAGNOSIS — R042 Hemoptysis: Secondary | ICD-10-CM | POA: Diagnosis not present

## 2011-08-20 DIAGNOSIS — J189 Pneumonia, unspecified organism: Secondary | ICD-10-CM

## 2011-08-20 DIAGNOSIS — J479 Bronchiectasis, uncomplicated: Secondary | ICD-10-CM | POA: Diagnosis not present

## 2011-08-20 DIAGNOSIS — A319 Mycobacterial infection, unspecified: Secondary | ICD-10-CM | POA: Diagnosis not present

## 2011-08-20 NOTE — Assessment & Plan Note (Signed)
The patient clearly has underlying bronchiectasis in the upper lobes left worse than right on her recent CT chest.  I suspect this was the etiology of her hemoptysis when she became infected.

## 2011-08-20 NOTE — Assessment & Plan Note (Signed)
The patient was found to have a culture positive for MAC, and there is nothing to indicate this represents an actual pulmonary infection.  More than likely, this is a colonizing organism related to her underlying bronchiectasis.  I would not worry about treating this unless she begins to have severe constitutional symptoms with worsening chest x-ray.

## 2011-08-20 NOTE — Patient Instructions (Addendum)
You have a condition called bronchiectasis that is related to your emphysema, and it is very common to cough up blood when you get a chest infection. The "MAC" that you grew in your culture is a colonizing organism, and likely had nothing to do with your recent pneumonia. Would be happy to see you again if problems recur, and will send a note to Dr. Laurann Montana.

## 2011-08-20 NOTE — Progress Notes (Signed)
  Subjective:    Patient ID: Katherine [name], female    DOB: [birth year], 78 y.o.   MRN: [id number]  HPI The patient is a 78 year old female who I've been asked to see for a recent episode of hemoptysis and an abnormal AFB culture.  The patient has known COPD with chronic respiratory failure, and was recently admitted to the hospital with bilateral upper lobe infiltrates.  She also had hemoptysis on her presentation.  She was felt to have community-acquired pneumonia, and started on appropriate antibiotics.  A CT chest showed emphysematous changes in both upper lobes, definite bronchiectasis left greater than right, and infiltrates in both upper lobes left greater than right.  Her bacterial cultures were unremarkable, but her AFB culture later returned positive for MAC.  The patient had a good clinical response to antibiotics, and her chest x-ray on the day of discharge had improved as well.  The patient feels that she is returning back to her usual baseline, and denies any loss of appetite or significant weight loss.  She has had no further hemoptysis, and will produce scant brown mucus on rare occasions.  She has had no yellow or green mucus, and does not feel that she has chest congestion.   Review of Systems  Constitutional: Negative for fever and unexpected weight change.  HENT: Negative for ear pain, nosebleeds, congestion, sore throat, rhinorrhea, sneezing, trouble swallowing, dental problem, postnasal drip and sinus pressure.   Eyes: Negative for redness and itching.  Respiratory: Positive for cough and shortness of breath. Negative for chest tightness and wheezing.   Cardiovascular: Negative for palpitations and leg swelling.  Gastrointestinal: Negative for nausea and vomiting.       Heartburn   Genitourinary: Negative for dysuria.  Musculoskeletal: Negative for joint swelling.  Skin: Negative for rash.  Neurological: Negative for headaches.  Hematological: Does not bruise/bleed easily.    Psychiatric/Behavioral: Negative for dysphoric mood. The patient is not nervous/anxious.   All other systems reviewed and are negative.       Objective:   Physical Exam Constitutional:  Frail appearing female, no acute distress  HENT:  Nares patent without discharge  Oropharynx without exudate, palate and uvula are normal  Eyes:  Perrla, eomi, no scleral icterus  Neck:  No JVD, no TMG  Cardiovascular:  Normal rate, regular rhythm, no rubs or gallops.  No murmurs        Intact distal pulses but diminished.  Pulmonary :  Decreased bs breath sounds, no stridor or respiratory distress   No rales, rhonchi, or wheezing  Abdominal:  Soft, nondistended, bowel sounds present.  No tenderness noted.   Musculoskeletal:  minimal lower extremity edema noted in the ankles.   Lymph Nodes:  No cervical lymphadenopathy noted  Skin:  No cyanosis noted  Neurologic:  Alert, appropriate, moves all 4 extremities without obvious deficit.         Assessment & Plan:

## 2011-08-20 NOTE — Assessment & Plan Note (Signed)
The patient's hemoptysis has resolved, and she has had no further issues with this.  More than likely, this is related to her underlying bronchiectasis and community-acquired pneumonia, however with her long smoking history, I cannot exclude the possibility of an occult underlying malignancy.  If she has another episode of hemoptysis, she will need a followup CT chest.

## 2011-08-20 NOTE — Assessment & Plan Note (Signed)
The patient's history is most consistent with a community-acquired pneumonia, and her chest x-ray by discharge did show definite improvement after being on antibiotics.  The patient is no longer bringing up purulent mucus except on rare occasions.  I do not think she needs additional antibiotics at this time.  She will be predisposed to recurrent infection given her underlying anatomical abnormalities.

## 2011-08-26 DIAGNOSIS — J189 Pneumonia, unspecified organism: Secondary | ICD-10-CM | POA: Diagnosis not present

## 2011-09-06 DIAGNOSIS — J449 Chronic obstructive pulmonary disease, unspecified: Secondary | ICD-10-CM | POA: Diagnosis not present

## 2011-09-06 DIAGNOSIS — J329 Chronic sinusitis, unspecified: Secondary | ICD-10-CM | POA: Diagnosis not present

## 2011-09-06 DIAGNOSIS — I10 Essential (primary) hypertension: Secondary | ICD-10-CM | POA: Diagnosis not present

## 2011-09-06 DIAGNOSIS — K219 Gastro-esophageal reflux disease without esophagitis: Secondary | ICD-10-CM | POA: Diagnosis not present

## 2011-09-06 LAB — AFB CULTURE WITH SMEAR (NOT AT ARMC)
Acid Fast Smear: NONE SEEN
Special Requests: NORMAL

## 2011-09-13 DIAGNOSIS — B351 Tinea unguium: Secondary | ICD-10-CM | POA: Diagnosis not present

## 2011-09-30 DIAGNOSIS — H3530 Unspecified macular degeneration: Secondary | ICD-10-CM | POA: Diagnosis not present

## 2011-09-30 DIAGNOSIS — Z961 Presence of intraocular lens: Secondary | ICD-10-CM | POA: Diagnosis not present

## 2011-11-08 DIAGNOSIS — B351 Tinea unguium: Secondary | ICD-10-CM | POA: Diagnosis not present

## 2011-12-20 ENCOUNTER — Encounter: Payer: Self-pay | Admitting: Vascular Surgery

## 2011-12-31 ENCOUNTER — Other Ambulatory Visit: Payer: Self-pay | Admitting: *Deleted

## 2011-12-31 DIAGNOSIS — I6529 Occlusion and stenosis of unspecified carotid artery: Secondary | ICD-10-CM

## 2011-12-31 DIAGNOSIS — Z48812 Encounter for surgical aftercare following surgery on the circulatory system: Secondary | ICD-10-CM

## 2012-01-03 DIAGNOSIS — L03119 Cellulitis of unspecified part of limb: Secondary | ICD-10-CM | POA: Diagnosis not present

## 2012-01-03 DIAGNOSIS — L02419 Cutaneous abscess of limb, unspecified: Secondary | ICD-10-CM | POA: Diagnosis not present

## 2012-01-05 ENCOUNTER — Encounter: Payer: Self-pay | Admitting: Neurosurgery

## 2012-01-06 ENCOUNTER — Other Ambulatory Visit (INDEPENDENT_AMBULATORY_CARE_PROVIDER_SITE_OTHER): Payer: Medicare Other | Admitting: *Deleted

## 2012-01-06 ENCOUNTER — Ambulatory Visit (INDEPENDENT_AMBULATORY_CARE_PROVIDER_SITE_OTHER): Payer: Medicare Other | Admitting: Neurosurgery

## 2012-01-06 ENCOUNTER — Encounter: Payer: Self-pay | Admitting: Neurosurgery

## 2012-01-06 VITALS — BP 169/51 | HR 76 | Resp 18 | Ht 60.0 in | Wt 127.0 lb

## 2012-01-06 DIAGNOSIS — Z48812 Encounter for surgical aftercare following surgery on the circulatory system: Secondary | ICD-10-CM | POA: Diagnosis not present

## 2012-01-06 DIAGNOSIS — I6529 Occlusion and stenosis of unspecified carotid artery: Secondary | ICD-10-CM

## 2012-01-06 NOTE — Addendum Note (Signed)
Addended by: Mena Goes on: 01/06/2012 03:06 PM   Modules accepted: Orders

## 2012-01-06 NOTE — Progress Notes (Signed)
[facility] Carotid Office Note  CC: Carotid surveillance Referring Physician: Fields  History of Present Illness: 78 year old female patient of Dr. Oneida Alar status post left CEA in 2010. The patient denies any signs or symptoms of CVA, TIA, amaurosis fugax or any neural deficit.  Past Medical History  Diagnosis Date  . Thyroid disease   . Hypertension   . Hyperlipemia   . Multiple allergies   . COPD (chronic obstructive pulmonary disease)   . Hypothyroidism   . Pneumonia   . Stroke 2010    no residual  . GERD (gastroesophageal reflux disease)   . Arthritis   . History of shingles 2007  . Constipation   . Osteoporosis   . Fatigue   . Seasonal allergies   . Frequent UTI   . Uterine cancer     ROS: [x]  Positive   [ ]  Denies    General: [ ]  Weight loss, [ ]  Fever, [ ]  chills Neurologic: [ ]  Dizziness, [ ]  Blackouts, [ ]  Seizure [ ]  Stroke, [ ]  "Mini stroke", [ ]  Slurred speech, [ ]  Temporary blindness; [ ]  weakness in arms or legs, [ ]  Hoarseness Cardiac: [ ]  Chest pain/pressure, [ ]  Shortness of breath at rest [ ]  Shortness of breath with exertion, [ ]  Atrial fibrillation or irregular heartbeat Vascular: [ ]  Pain in legs with walking, [ ]  Pain in legs at rest, [ ]  Pain in legs at night,  [ ]  Non-healing ulcer, [ ]  Blood clot in vein/DVT,   Pulmonary: [ ]  Home oxygen, [ ]  Productive cough, [ ]  Coughing up blood, [ ]  Asthma,  [ ]  Wheezing Musculoskeletal:  [ ]  Arthritis, [ ]  Low back pain, [ ]  Joint pain Hematologic: [ ]  Easy Bruising, [ ]  Anemia; [ ]  Hepatitis Gastrointestinal: [ ]  Blood in stool, [ ]  Gastroesophageal Reflux/heartburn, [ ]  Trouble swallowing Urinary: [ ]  chronic Kidney disease, [ ]  on HD - [ ]  MWF or [ ]  TTHS, [ ]  Burning with urination, [ ]  Difficulty urinating Skin: [ ]  Rashes, [ ]  Wounds Psychological: [ ]  Anxiety, [ ]  Depression   Social History History  Substance Use Topics  . Smoking status: Former Smoker -- 1.0  packs/day for 40 years    Types: Cigarettes    Quit date: 02/15/1981  . Smokeless tobacco: Not on file  . Alcohol Use: Yes    Family History Family History  Problem Relation Age of Onset  . Asthma Paternal Grandmother   . Heart failure Mother   . Heart failure Father     Allergies  Allergen Reactions  . Penicillins   . Sulfa Antibiotics     Current Outpatient Prescriptions  Medication Sig Dispense Refill  . amLODipine (NORVASC) 5 MG tablet Take 5 mg by mouth daily.      Marland Kitchen aspirin EC 81 MG tablet Take 81 mg by mouth daily.      Marland Kitchen azelastine (ASTELIN) 137 MCG/SPRAY nasal spray Place 1 spray into the nose 2 (two) times daily. Use in each nostril as directed      . Calcium Carbonate-Vitamin D (CALTRATE 600+D PO) Take 1 tablet by mouth 2 (two) times daily.      Marland Kitchen esomeprazole (NEXIUM) 40 MG capsule Take 40 mg by mouth daily before breakfast.      . fluticasone (FLONASE) 50 MCG/ACT nasal spray Place 2 sprays into the nose daily.      . Fluticasone-Salmeterol (ADVAIR) 250-50 MCG/DOSE AEPB Inhale 1 puff into  the lungs every 12 (twelve) hours.      Marland Kitchen levothyroxine (SYNTHROID, LEVOTHROID) 75 MCG tablet Take 75 mcg by mouth daily.      . Multiple Vitamins-Minerals (CENTRUM SILVER PO) Take 1 tablet by mouth daily.      . Multiple Vitamins-Minerals (ICAPS MV) TABS Take 1 tablet by mouth daily.      . nitrofurantoin, macrocrystal-monohydrate, (MACROBID) 100 MG capsule Take 1 capsule (100 mg total) by mouth daily.  30 capsule  11  . simvastatin (ZOCOR) 20 MG tablet Take 20 mg by mouth every evening.      . tiotropium (SPIRIVA) 18 MCG inhalation capsule Place 18 mcg into inhaler and inhale daily.      . valsartan-hydrochlorothiazide (DIOVAN-HCT) 160-12.5 MG per tablet Take 1 tablet by mouth daily.      . Vitamin D, Ergocalciferol, (DRISDOL) 50000 UNITS CAPS Take 50,000 Units by mouth every 30 (thirty) days.        Physical Examination  There were no vitals filed for this visit.  There is  no height or weight on file to calculate BMI.  General:  WDWN in NAD Gait: Normal HEENT: WNL Eyes: Pupils equal Pulmonary: normal non-labored breathing , without Rales, rhonchi,  wheezing Cardiac: RRR, without  Murmurs, rubs or gallops; Abdomen: soft, NT, no masses Skin: no rashes, ulcers noted  Vascular Exam Pulses: 2+ radial pulses bilaterally Carotid bruits: Carotid pulses to auscultation no bruits are heard Extremities without ischemic changes, no Gangrene , no cellulitis; no open wounds;  Musculoskeletal: no muscle wasting or atrophy   Neurologic: A&O X 3; Appropriate Affect ; SENSATION: normal; MOTOR FUNCTION:  moving all extremities equally. Speech is fluent/normal  Non-Invasive Vascular Imaging CAROTID DUPLEX 01/06/2012  Right ICA 20 - 39 % stenosis Left ICA 0 - 19% stenosis   ASSESSMENT/PLAN: Asymptomatic patient 3 years status post left CEA and doing well. The patient will followup in one year with repeat carotid duplex. The patient's questions were encouraged and answered, she is in agreement with this plan.  Beatris Ship ANP   Clinic MD: Oneida Alar

## 2012-01-07 DIAGNOSIS — L02419 Cutaneous abscess of limb, unspecified: Secondary | ICD-10-CM | POA: Diagnosis not present

## 2012-01-31 DIAGNOSIS — B351 Tinea unguium: Secondary | ICD-10-CM | POA: Diagnosis not present

## 2012-03-06 DIAGNOSIS — E785 Hyperlipidemia, unspecified: Secondary | ICD-10-CM | POA: Diagnosis not present

## 2012-03-06 DIAGNOSIS — R5383 Other fatigue: Secondary | ICD-10-CM | POA: Diagnosis not present

## 2012-03-06 DIAGNOSIS — K219 Gastro-esophageal reflux disease without esophagitis: Secondary | ICD-10-CM | POA: Diagnosis not present

## 2012-03-06 DIAGNOSIS — J449 Chronic obstructive pulmonary disease, unspecified: Secondary | ICD-10-CM | POA: Diagnosis not present

## 2012-03-06 DIAGNOSIS — I10 Essential (primary) hypertension: Secondary | ICD-10-CM | POA: Diagnosis not present

## 2012-03-06 DIAGNOSIS — E039 Hypothyroidism, unspecified: Secondary | ICD-10-CM | POA: Diagnosis not present

## 2012-03-06 DIAGNOSIS — J309 Allergic rhinitis, unspecified: Secondary | ICD-10-CM | POA: Diagnosis not present

## 2012-03-06 DIAGNOSIS — R5381 Other malaise: Secondary | ICD-10-CM | POA: Diagnosis not present

## 2012-03-16 ENCOUNTER — Inpatient Hospital Stay (HOSPITAL_COMMUNITY)
Admission: EM | Admit: 2012-03-16 | Discharge: 2012-03-20 | DRG: 192 | Disposition: A | Discharge status: Skilled Nursing Facility | Payer: Medicare Other | Attending: Internal Medicine | Admitting: Internal Medicine

## 2012-03-16 ENCOUNTER — Encounter (HOSPITAL_COMMUNITY): Payer: Self-pay | Admitting: Emergency Medicine

## 2012-03-16 ENCOUNTER — Emergency Department (HOSPITAL_COMMUNITY): Payer: Medicare Other

## 2012-03-16 DIAGNOSIS — E039 Hypothyroidism, unspecified: Secondary | ICD-10-CM | POA: Diagnosis present

## 2012-03-16 DIAGNOSIS — N189 Chronic kidney disease, unspecified: Secondary | ICD-10-CM | POA: Diagnosis not present

## 2012-03-16 DIAGNOSIS — Z8673 Personal history of transient ischemic attack (TIA), and cerebral infarction without residual deficits: Secondary | ICD-10-CM

## 2012-03-16 DIAGNOSIS — E785 Hyperlipidemia, unspecified: Secondary | ICD-10-CM | POA: Diagnosis present

## 2012-03-16 DIAGNOSIS — R4182 Altered mental status, unspecified: Secondary | ICD-10-CM

## 2012-03-16 DIAGNOSIS — M6281 Muscle weakness (generalized): Secondary | ICD-10-CM | POA: Diagnosis not present

## 2012-03-16 DIAGNOSIS — E876 Hypokalemia: Secondary | ICD-10-CM | POA: Diagnosis present

## 2012-03-16 DIAGNOSIS — I129 Hypertensive chronic kidney disease with stage 1 through stage 4 chronic kidney disease, or unspecified chronic kidney disease: Secondary | ICD-10-CM | POA: Diagnosis present

## 2012-03-16 DIAGNOSIS — J441 Chronic obstructive pulmonary disease with (acute) exacerbation: Secondary | ICD-10-CM | POA: Diagnosis not present

## 2012-03-16 DIAGNOSIS — E638 Other specified nutritional deficiencies: Secondary | ICD-10-CM | POA: Diagnosis not present

## 2012-03-16 DIAGNOSIS — J209 Acute bronchitis, unspecified: Secondary | ICD-10-CM | POA: Diagnosis present

## 2012-03-16 DIAGNOSIS — I672 Cerebral atherosclerosis: Secondary | ICD-10-CM | POA: Diagnosis not present

## 2012-03-16 DIAGNOSIS — D72829 Elevated white blood cell count, unspecified: Secondary | ICD-10-CM | POA: Diagnosis present

## 2012-03-16 DIAGNOSIS — J4489 Other specified chronic obstructive pulmonary disease: Secondary | ICD-10-CM | POA: Diagnosis not present

## 2012-03-16 DIAGNOSIS — Z9981 Dependence on supplemental oxygen: Secondary | ICD-10-CM

## 2012-03-16 DIAGNOSIS — N183 Chronic kidney disease, stage 3 unspecified: Secondary | ICD-10-CM | POA: Diagnosis present

## 2012-03-16 DIAGNOSIS — J449 Chronic obstructive pulmonary disease, unspecified: Secondary | ICD-10-CM | POA: Diagnosis not present

## 2012-03-16 DIAGNOSIS — R404 Transient alteration of awareness: Secondary | ICD-10-CM | POA: Diagnosis not present

## 2012-03-16 DIAGNOSIS — J440 Chronic obstructive pulmonary disease with acute lower respiratory infection: Secondary | ICD-10-CM | POA: Diagnosis not present

## 2012-03-16 DIAGNOSIS — E038 Other specified hypothyroidism: Secondary | ICD-10-CM | POA: Diagnosis not present

## 2012-03-16 DIAGNOSIS — R279 Unspecified lack of coordination: Secondary | ICD-10-CM | POA: Diagnosis not present

## 2012-03-16 DIAGNOSIS — I10 Essential (primary) hypertension: Secondary | ICD-10-CM | POA: Diagnosis not present

## 2012-03-16 DIAGNOSIS — R5383 Other fatigue: Secondary | ICD-10-CM | POA: Diagnosis not present

## 2012-03-16 DIAGNOSIS — F05 Delirium due to known physiological condition: Secondary | ICD-10-CM | POA: Diagnosis not present

## 2012-03-16 DIAGNOSIS — M810 Age-related osteoporosis without current pathological fracture: Secondary | ICD-10-CM | POA: Diagnosis not present

## 2012-03-16 DIAGNOSIS — Z66 Do not resuscitate: Secondary | ICD-10-CM | POA: Diagnosis present

## 2012-03-16 DIAGNOSIS — Z79899 Other long term (current) drug therapy: Secondary | ICD-10-CM | POA: Diagnosis not present

## 2012-03-16 DIAGNOSIS — K219 Gastro-esophageal reflux disease without esophagitis: Secondary | ICD-10-CM | POA: Diagnosis present

## 2012-03-16 DIAGNOSIS — R5381 Other malaise: Secondary | ICD-10-CM | POA: Diagnosis not present

## 2012-03-16 DIAGNOSIS — J309 Allergic rhinitis, unspecified: Secondary | ICD-10-CM | POA: Diagnosis not present

## 2012-03-16 DIAGNOSIS — M1990 Unspecified osteoarthritis, unspecified site: Secondary | ICD-10-CM | POA: Diagnosis not present

## 2012-03-16 DIAGNOSIS — J40 Bronchitis, not specified as acute or chronic: Secondary | ICD-10-CM | POA: Diagnosis not present

## 2012-03-16 LAB — COMPREHENSIVE METABOLIC PANEL
ALT: 14 U/L (ref 0–35)
BUN: 20 mg/dL (ref 6–23)
Calcium: 8.7 mg/dL (ref 8.4–10.5)
GFR calc Af Amer: 56 mL/min — ABNORMAL LOW (ref >90)
Glucose, Bld: 123 mg/dL — ABNORMAL HIGH (ref 70–99)
Sodium: 130 mEq/L — ABNORMAL LOW (ref 135–145)
Total Protein: 6.7 g/dL (ref 6.0–8.3)

## 2012-03-16 LAB — CBC WITH DIFFERENTIAL/PLATELET
Basophils Absolute: 0 10*3/uL (ref 0.0–0.1)
Basophils Relative: 0 % (ref 0–1)
MCHC: 33.5 g/dL (ref 30.0–36.0)
Monocytes Absolute: 2.4 10*3/uL — ABNORMAL HIGH (ref 0.1–1.0)
Neutro Abs: 12.9 10*3/uL — ABNORMAL HIGH (ref 1.7–7.7)
Neutrophils Relative %: 79 % — ABNORMAL HIGH (ref 43–77)
RDW: 13.1 % (ref 11.5–15.5)

## 2012-03-16 LAB — PROTIME-INR: INR: 1.11 (ref 0.00–1.49)

## 2012-03-16 LAB — URINALYSIS, ROUTINE W REFLEX MICROSCOPIC
Leukocytes, UA: NEGATIVE
Nitrite: NEGATIVE
Protein, ur: 30 mg/dL — AB

## 2012-03-16 LAB — URINE MICROSCOPIC-ADD ON

## 2012-03-16 LAB — INFLUENZA PANEL BY PCR (TYPE A & B)
Influenza A By PCR: NEGATIVE
Influenza B By PCR: NEGATIVE

## 2012-03-16 LAB — LACTIC ACID, PLASMA: Lactic Acid, Venous: 1 mmol/L (ref 0.5–2.2)

## 2012-03-16 MED ORDER — IRBESARTAN 150 MG PO TABS
150.0000 mg | ORAL_TABLET | Freq: Every day | ORAL | Status: DC
  Administered 2012-03-16 – 2012-03-20 (×5): 150 mg via ORAL
  Filled 2012-03-16 (×5): qty 1

## 2012-03-16 MED ORDER — AZITHROMYCIN 250 MG PO TABS
500.0000 mg | ORAL_TABLET | Freq: Once | ORAL | Status: AC
  Administered 2012-03-16: 500 mg via ORAL
  Filled 2012-03-16: qty 2

## 2012-03-16 MED ORDER — ACETAMINOPHEN 325 MG PO TABS: 650.0000 mg | ORAL_TABLET | Freq: Four times a day (QID) | ORAL | Status: DC | PRN

## 2012-03-16 MED ORDER — GUAIFENESIN-DM 100-10 MG/5ML PO SYRP: 5.0000 mL | ORAL_SOLUTION | ORAL | Status: DC | PRN

## 2012-03-16 MED ORDER — HYDROCHLOROTHIAZIDE 12.5 MG PO CAPS
12.5000 mg | ORAL_CAPSULE | Freq: Every day | ORAL | Status: DC
  Administered 2012-03-16 – 2012-03-20 (×5): 12.5 mg via ORAL
  Filled 2012-03-16 (×5): qty 1

## 2012-03-16 MED ORDER — PREDNISONE 20 MG PO TABS
60.0000 mg | ORAL_TABLET | ORAL | Status: AC
  Administered 2012-03-16: 60 mg via ORAL
  Filled 2012-03-16: qty 3

## 2012-03-16 MED ORDER — ALBUTEROL SULFATE (5 MG/ML) 0.5% IN NEBU
2.5000 mg | INHALATION_SOLUTION | Freq: Four times a day (QID) | RESPIRATORY_TRACT | Status: DC
  Administered 2012-03-16 – 2012-03-20 (×15): 2.5 mg via RESPIRATORY_TRACT
  Filled 2012-03-16 (×15): qty 0.5

## 2012-03-16 MED ORDER — LEVOFLOXACIN 750 MG PO TABS
750.0000 mg | ORAL_TABLET | Freq: Every day | ORAL | Status: DC
  Administered 2012-03-16 – 2012-03-17 (×2): 750 mg via ORAL
  Filled 2012-03-16 (×3): qty 1

## 2012-03-16 MED ORDER — SODIUM CHLORIDE 0.9 % IV SOLN: 1000.0000 mL | INTRAVENOUS | Status: DC

## 2012-03-16 MED ORDER — ACETAMINOPHEN 650 MG RE SUPP: 650.0000 mg | Freq: Four times a day (QID) | RECTAL | Status: DC | PRN

## 2012-03-16 MED ORDER — SIMVASTATIN 20 MG PO TABS
20.0000 mg | ORAL_TABLET | Freq: Every day | ORAL | Status: DC
  Administered 2012-03-16 – 2012-03-19 (×4): 20 mg via ORAL
  Filled 2012-03-16 (×5): qty 1

## 2012-03-16 MED ORDER — FLUTICASONE PROPIONATE 50 MCG/ACT NA SUSP
2.0000 | Freq: Every day | NASAL | Status: DC
  Administered 2012-03-16 – 2012-03-20 (×5): 2 via NASAL
  Filled 2012-03-16: qty 16

## 2012-03-16 MED ORDER — ENOXAPARIN SODIUM 40 MG/0.4ML ~~LOC~~ SOLN: 40.0000 mg | SUBCUTANEOUS | Status: DC

## 2012-03-16 MED ORDER — PANTOPRAZOLE SODIUM 40 MG PO TBEC
40.0000 mg | DELAYED_RELEASE_TABLET | Freq: Every day | ORAL | Status: DC
  Administered 2012-03-16 – 2012-03-20 (×5): 40 mg via ORAL
  Filled 2012-03-16 (×5): qty 1

## 2012-03-16 MED ORDER — NITROFURANTOIN MONOHYD MACRO 100 MG PO CAPS
100.0000 mg | ORAL_CAPSULE | Freq: Every day | ORAL | Status: DC
  Administered 2012-03-16: 100 mg via ORAL
  Filled 2012-03-16 (×2): qty 1

## 2012-03-16 MED ORDER — METHYLPREDNISOLONE SODIUM SUCC 125 MG IJ SOLR
80.0000 mg | Freq: Four times a day (QID) | INTRAMUSCULAR | Status: DC
  Administered 2012-03-16 – 2012-03-17 (×3): 80 mg via INTRAVENOUS
  Filled 2012-03-16 (×7): qty 1.28

## 2012-03-16 MED ORDER — ALBUTEROL SULFATE (5 MG/ML) 0.5% IN NEBU: 2.5000 mg | INHALATION_SOLUTION | RESPIRATORY_TRACT | Status: DC | PRN

## 2012-03-16 MED ORDER — SODIUM CHLORIDE 0.9 % IV SOLN
1000.0000 mL | Freq: Once | INTRAVENOUS | Status: AC
  Administered 2012-03-16: 1000 mL via INTRAVENOUS

## 2012-03-16 MED ORDER — AZELASTINE HCL 0.1 % NA SOLN
1.0000 | Freq: Two times a day (BID) | NASAL | Status: DC
  Administered 2012-03-16 – 2012-03-20 (×8): 1 via NASAL
  Filled 2012-03-16: qty 30

## 2012-03-16 MED ORDER — IPRATROPIUM BROMIDE 0.02 % IN SOLN
0.5000 mg | Freq: Four times a day (QID) | RESPIRATORY_TRACT | Status: DC
  Administered 2012-03-16 – 2012-03-20 (×15): 0.5 mg via RESPIRATORY_TRACT
  Filled 2012-03-16 (×15): qty 2.5

## 2012-03-16 MED ORDER — LEVOTHYROXINE SODIUM 75 MCG PO TABS
75.0000 ug | ORAL_TABLET | Freq: Every day | ORAL | Status: DC
  Administered 2012-03-17: 75 ug via ORAL
  Filled 2012-03-16 (×3): qty 1

## 2012-03-16 MED ORDER — POLYETHYLENE GLYCOL 3350 17 G PO PACK
17.0000 g | PACK | Freq: Every day | ORAL | Status: DC | PRN
  Filled 2012-03-16: qty 1

## 2012-03-16 MED ORDER — VALSARTAN-HYDROCHLOROTHIAZIDE 160-12.5 MG PO TABS: 1.0000 | ORAL_TABLET | Freq: Every day | ORAL | Status: DC

## 2012-03-16 MED ORDER — LORATADINE 10 MG PO TABS
10.0000 mg | ORAL_TABLET | Freq: Every day | ORAL | Status: DC
  Administered 2012-03-16 – 2012-03-20 (×5): 10 mg via ORAL
  Filled 2012-03-16 (×5): qty 1

## 2012-03-16 MED ORDER — ENOXAPARIN SODIUM 30 MG/0.3ML ~~LOC~~ SOLN
30.0000 mg | SUBCUTANEOUS | Status: DC
  Administered 2012-03-16 – 2012-03-19 (×4): 30 mg via SUBCUTANEOUS
  Filled 2012-03-16 (×5): qty 0.3

## 2012-03-16 MED ORDER — ASPIRIN EC 81 MG PO TBEC
81.0000 mg | DELAYED_RELEASE_TABLET | Freq: Every day | ORAL | Status: DC
  Administered 2012-03-16 – 2012-03-20 (×5): 81 mg via ORAL
  Filled 2012-03-16 (×5): qty 1

## 2012-03-16 MED ORDER — VITAMIN D (ERGOCALCIFEROL) 1.25 MG (50000 UNIT) PO CAPS
50000.0000 [IU] | ORAL_CAPSULE | ORAL | Status: DC
  Administered 2012-03-18: 50000 [IU] via ORAL
  Filled 2012-03-16: qty 1

## 2012-03-16 NOTE — H&P (Signed)
[facility] History and Physical  PARISHA AMEY [id number] DOB: 1918-02-14 DOA: 03/16/2012  Referring physician: Dr Vanita Panda  PCP: Irven Shelling, MD   Chief Complaint:   fever with Altered mental status x 1 day  HPI:  79 year old female with history off hypertension, hypothyroidism, COPD on 2 L home O2,  history of stroke,  GERD lives in an assisted-living was brought to the ED for fever with altered mental status yesterday evening. As per the ED note she had fever at the assisted-living and fell twice while trying to ambulate without sustaining any injury. She was also confused and not oriented. Patient is clearly alert and oriented on my evaluation in the ED and informs that she has been having cough with whitish phlegm for past 2 weeks and saw her PCP who placed her on a course of prednisone. Her cough did improve slightly however she developed fever with chills yesterday and has been having some runny nose for past few days. She is unable to recall the episode of confusion. She informs that she has been slightly more short of breath on exertion for past 2 days. She denies any sick contacts, headache, blurry vision, dizziness, nausea, vomiting, chest pain, palpitations, orthopnea, PND, abdominal pain, bowel or urinary symptoms. In the ED patient was noted to desaturate to 85 on 2 L with nasal cannula, was noted to have leukocytosis and temperature of 100F. A chest x-ray obtained showed COPD changes without any acute infiltrate. [facility] called to admit patient with concern for COPD exacerbation. At baseline patient is ambulatory with the help of a walker.  Review of Systems:  Constitutional:  fever, chills, Denies diaphoresis, appetite change and fatigue.  HEENT: Positive for rhinorrhea and sneezing. Denies photophobia, eye pain, redness, hearing loss, ear pain, congestion, sore throat,  mouth sores, trouble swallowing, neck pain, neck stiffness and tinnitus.    Respiratory: Has dyspnea on exertion associated with cough Denies  chest tightness,  and wheezing.   Cardiovascular: Denies chest pain, palpitations and leg swelling.  Gastrointestinal: Denies nausea, vomiting, abdominal pain, diarrhea, constipation, blood in stool and abdominal distention.  Genitourinary: Denies dysuria, urgency, frequency, hematuria, flank pain and difficulty urinating.  Musculoskeletal: Denies myalgias, back pain, joint swelling, arthralgias and gait problem.  Skin: Denies pallor, rash and wound.  Neurological: Denies dizziness, seizures, syncope, weakness, light-headedness, numbness and headaches.  Hematological: Denies adenopathy. Easy bruising, personal or family bleeding history  Psychiatric/Behavioral: Denies suicidal ideation, mood changes, confusion, nervousness, sleep disturbance and agitation   Past Medical History  Diagnosis Date  . Thyroid disease   . Hypertension   . Hyperlipemia   . Multiple allergies   . COPD (chronic obstructive pulmonary disease)   . Hypothyroidism   . Pneumonia   . Stroke 2010    no residual  . GERD (gastroesophageal reflux disease)   . Arthritis   . History of shingles 2007  . Constipation   . Osteoporosis   . Fatigue   . Seasonal allergies   . Frequent UTI   . Uterine cancer    Past Surgical History  Procedure Date  . Abdominal hysterectomy   . Breast lumpectomy     left breast  . Carotid endarterectomy   . Tonsillectomy   . Eye surgery     cataracts removed bilaterally   Social History:  reports that she quit smoking about 31 years ago. Her smoking use included Cigarettes. She has a 40 pack-year smoking history. She has never used smokeless tobacco. She reports  that she drinks alcohol. She reports that she does not use illicit drugs.  Allergies  Allergen Reactions  . Amoxicillin   . Sulfa Antibiotics     SOB  . Penicillins Rash    Family History  Problem Relation Age of Onset  . Asthma Paternal  Grandmother   . Heart failure Mother   . Heart failure Father     Prior to Admission medications   Medication Sig Start Date End Date Taking? Authorizing Provider  amLODipine (NORVASC) 5 MG tablet Take 5 mg by mouth daily.   Yes Historical Provider, MD  aspirin EC 81 MG tablet Take 81 mg by mouth daily.   Yes Historical Provider, MD  azelastine (ASTELIN) 137 MCG/SPRAY nasal spray Place 1 spray into the nose 2 (two) times daily. Use in each nostril as directed   Yes Historical Provider, MD  Calcium Carbonate-Vitamin D (CALTRATE 600+D PO) Take 1 tablet by mouth 2 (two) times daily.   Yes Historical Provider, MD  fexofenadine (ALLEGRA) 180 MG tablet Take 180 mg by mouth daily.   Yes Historical Provider, MD  fluticasone (FLONASE) 50 MCG/ACT nasal spray Place 2 sprays into the nose daily.   Yes Historical Provider, MD  Fluticasone-Salmeterol (ADVAIR) 250-50 MCG/DOSE AEPB Inhale 1 puff into the lungs every 12 (twelve) hours.   Yes Historical Provider, MD  levothyroxine (SYNTHROID, LEVOTHROID) 75 MCG tablet Take 75 mcg by mouth daily.   Yes Historical Provider, MD  Multiple Vitamins-Minerals (CENTRUM SILVER PO) Take 1 tablet by mouth daily.   Yes Historical Provider, MD  Multiple Vitamins-Minerals (ICAPS MV) TABS Take 1 tablet by mouth daily.   Yes Historical Provider, MD  nitrofurantoin, macrocrystal-monohydrate, (MACROBID) 100 MG capsule Take 1 capsule (100 mg total) by mouth daily. 07/27/11  Yes Irven Shelling, MD  polyethylene glycol [facility] / Floria Raveling) packet Take 17 g by mouth daily as needed.   Yes Historical Provider, MD  simvastatin (ZOCOR) 20 MG tablet Take 20 mg by mouth every evening.   Yes Historical Provider, MD  tiotropium (SPIRIVA) 18 MCG inhalation capsule Place 18 mcg into inhaler and inhale daily.   Yes Historical Provider, MD  valsartan-hydrochlorothiazide (DIOVAN-HCT) 160-12.5 MG per tablet Take 1 tablet by mouth daily.   Yes Historical Provider, MD  Vitamin D,  Ergocalciferol, (DRISDOL) 50000 UNITS CAPS Take 50,000 Units by mouth every 30 (thirty) days.   Yes Historical Provider, MD    Physical Exam:  Filed Vitals:   03/16/12 0950 03/16/12 1009  BP:  149/57  Pulse:  98  Temp:  100 F (37.8 C)  TempSrc:  Oral  Resp:  20  SpO2: 98% 85%    Constitutional: Vital signs reviewed.  Patient is a well-developed and well-nourished in no acute distress and cooperative with exam. Alert and oriented x3.  Head: Normocephalic and atraumatic Ear: TM normal bilaterally Mouth: no erythema or exudates, MMM Eyes: PERRL, EOMI, conjunctivae normal, No scleral icterus.  Neck: Supple, Trachea midline normal ROM, No JVD, mass, thyromegaly, or carotid bruit present.  Cardiovascular: RRR, S1 normal, S2 normal, no MRG, pulses symmetric and intact bilaterally Pulmonary/Chest: Equal air entry bilaterally scattered rhonchi and few right basilar crackles Abdominal: Soft. Non-tender, non-distended, bowel sounds are normal, no masses, organomegaly, or guarding present.  GU: no CVA tenderness Musculoskeletal: No joint deformities, erythema, or stiffness, ROM full and no nontender Ext: no edema and no cyanosis, pulses palpable bilaterally (DP and PT) Hematology: no cervical, inginal, or axillary adenopathy.  Neurological: A&O x3, Strenght is normal  and symmetric bilaterally, cranial nerve II-XII are grossly intact, no focal motor deficit, sensory intact to light touch bilaterally.  Skin: Warm, dry and intact. No rash, cyanosis, or clubbing.  Psychiatric: Normal mood and affect. speech and behavior is normal. Judgment and thought content normal. Cognition and memory are normal.   Labs on Admission:  Basic Metabolic Panel:  Lab [id number] 1100  NA 130*  K 3.5  CL 94*  CO2 25  GLUCOSE 123*  BUN 20  CREATININE 0.98  CALCIUM 8.7  MG --  PHOS --   Liver Function Tests:  Lab 03/16/12 1100  AST 21  ALT 14  ALKPHOS 54  BILITOT 0.5  PROT 6.7  ALBUMIN 2.9*   No  results found for this basename: LIPASE:5,AMYLASE:5 in the last 168 hours No results found for this basename: AMMONIA:5 in the last 168 hours CBC:  Lab 03/16/12 1100  WBC 16.5*  NEUTROABS 12.9*  HGB 11.9*  HCT 35.5*  MCV 90.1  PLT 248   Cardiac Enzymes: No results found for this basename: CKTOTAL:5,CKMB:5,CKMBINDEX:5,TROPONINI:5 in the last 168 hours BNP: No components found with this basename: POCBNP:5 CBG: No results found for this basename: GLUCAP:5 in the last 168 hours  Radiological Exams on Admission: Dg Chest 2 View  03/16/2012  *RADIOLOGY REPORT*  Clinical Data: Fever and cough.  Altered level of consciousness. Shortness of breath.  CHEST - 2 VIEW  Comparison: 07/26/2011  Findings: Mild cardiomegaly is stable as well as diffuse pulmonary interstitial prominence.  Changes of COPD are also seen.  No definite evidence of acute infiltrate or edema.  No evidence of pleural effusion.  Prominent epicardial fat pad again seen in the right cardiophrenic angle.  Lower thoracic spine degenerative disc disease is demonstrated as well as thoracic levoscoliosis.  IMPRESSION:  1.  Stable cardiomegaly, COPD, and chronic pulmonary interstitial prominence. 2.  No acute lung disease. 3.  Prominent epicardial fat pad again noted.   Original Report Authenticated By: Earle Gell, M.D.    Ct Head Wo Contrast  03/16/2012  *RADIOLOGY REPORT*  Clinical Data: Fever and altered level of consciousness.  CT HEAD WITHOUT CONTRAST  Technique:  Contiguous axial images were obtained from the base of the skull through the vertex without contrast.  Comparison: MRI brain 05/13/2008.  Findings: Atrophy and extensive white matter changes are present. Remote lacunar infarcts of the basal ganglia are similar to the prior study.  No acute cortical infarct, hemorrhage, or mass lesion is present.  The ventricles are proportionate to the degree of atrophy.  Atherosclerotic calcifications are present within the cavernous carotid  arteries bilaterally.  The paranasal sinuses and mastoid air cells are clear.  The osseous skull is intact.  IMPRESSION:  1.  Stable atrophy and diffuse white matter disease. 2.  No acute intracranial abnormality. 3.  Atherosclerosis.   Original Report Authenticated By: San Morelle, M.D.     EKG:   Assessment/Plan Principal Problem:  *COPD (chronic obstructive pulmonary disease) with acute bronchitis Admit to MedSurg Patient likely has COPD exacerbation with associated acute bronchitis. She has been having productive cough for 2 weeks and treated with a course of prednisone recently. I will start her on IV Solu Medrol 80 mg every 6 hours along with scheduled and nebs. We'll check for blood and sputum culture. Will start her on a course of Levaquin for associated bronchitis. Patient also gives history of URI symptoms. With associated fever and altered mental status I will check for a influenza PCR. -Doppler precautions -  Continue O2 via nasal cannula at 3 L (patient satting at 92-94%) on this continue Synthroid -Leukocytosis is likely in the setting off a recent steroid use. We'll monitor in a.m.  Active problems Altered mental status Currently resolved. Head CT negative. Likely in the setting of acute COPD and possible flu .    Hypothyroidism Continue Synthroid   Hypertension  stable. continue home BP meds  History of CVA Continue statin  DVT prophylaxis: Subcutaneous Lovenox   diet: Cardiac  Code Status: DO NOT RESUSCITATE  Family Communication: Nephew at bedside  Disposition Plan: Back to assist living once stable   Caili Escalera, [facility] Pager [phone]   If 7PM-7AM, please contact night-coverage [email or web address] Password [email or web address] 03/16/2012, 1:06 PM   Total time spent on admission: 70 minutes  Patient follows with Dr. Laurann Montana. Will call eagle at  [facility] and inform to follow patient from 1/31

## 2012-03-16 NOTE — ED Provider Notes (Signed)
History     CSN: [id number]  Arrival date & time 03/16/12  [id number]   First MD Initiated Contact with Patient 03/16/12 [phone]      Chief Complaint  Patient presents with  . Fever    (Consider location/radiation/quality/duration/timing/severity/associated sxs/prior treatment) HPI The patient presents from her nursing facility with concern for fever, cough, altered mental status. The patient states that she's had a cough for approximately one week, and there've been no appreciable changes with initiation of steroids. The patient states that she currently has no pain, and she denies disorientation, but in the middle of the interview, she has a period of decreased interactivity, which resolves spontaneously. History of present illness is provided by the patient, and family members or did not that over the past day she has had episodic similar episodes, which is atypical for her.  Patient had a unwitnessed fall approximately 6 hours ago.   Past Medical History  Diagnosis Date  . Thyroid disease   . Hypertension   . Hyperlipemia   . Multiple allergies   . COPD (chronic obstructive pulmonary disease)   . Hypothyroidism   . Pneumonia   . Stroke 2010    no residual  . GERD (gastroesophageal reflux disease)   . Arthritis   . History of shingles 2007  . Constipation   . Osteoporosis   . Fatigue   . Seasonal allergies   . Frequent UTI   . Uterine cancer     Past Surgical History  Procedure Date  . Abdominal hysterectomy   . Breast lumpectomy     left breast  . Carotid endarterectomy   . Tonsillectomy   . Eye surgery     cataracts removed bilaterally    Family History  Problem Relation Age of Onset  . Asthma Paternal Grandmother   . Heart failure Mother   . Heart failure Father     History  Substance Use Topics  . Smoking status: Former Smoker -- 1.0 packs/day for 40 years    Types: Cigarettes    Quit date: 02/15/1981  . Smokeless tobacco: Never Used  . Alcohol Use:  Yes    OB History    Grav Para Term Preterm Abortions TAB SAB Ect Mult Living                  Review of Systems  Constitutional:       Per HPI, otherwise negative  HENT:       Per HPI, otherwise negative  Eyes: Negative.   Respiratory:       Per HPI, otherwise negative  Cardiovascular:       Per HPI, otherwise negative  Gastrointestinal: Negative for vomiting.  Genitourinary: Negative.   Musculoskeletal:       Per HPI, otherwise negative  Skin: Negative.   Neurological: Positive for weakness. Negative for syncope.  Psychiatric/Behavioral: Positive for confusion and decreased concentration.    Allergies  Amoxicillin; Sulfa antibiotics; and Penicillins  Home Medications   Current Outpatient Rx  Name  Route  Sig  Dispense  Refill  . AMLODIPINE BESYLATE 5 MG PO TABS   Oral   Take 5 mg by mouth daily.         . ASPIRIN EC 81 MG PO TBEC   Oral   Take 81 mg by mouth daily.         . AZELASTINE HCL 137 MCG/SPRAY NA SOLN   Nasal   Place 1 spray into the nose 2 (two) times daily.  Use in each nostril as directed         . CALTRATE 600+D PO   Oral   Take 1 tablet by mouth 2 (two) times daily.         Marland Kitchen FEXOFENADINE HCL 180 MG PO TABS   Oral   Take 180 mg by mouth daily.         Marland Kitchen FLUTICASONE PROPIONATE 50 MCG/ACT NA SUSP   Nasal   Place 2 sprays into the nose daily.         Marland Kitchen FLUTICASONE-SALMETEROL 250-50 MCG/DOSE IN AEPB   Inhalation   Inhale 1 puff into the lungs every 12 (twelve) hours.         Marland Kitchen LEVOTHYROXINE SODIUM 75 MCG PO TABS   Oral   Take 75 mcg by mouth daily.         . CENTRUM SILVER PO   Oral   Take 1 tablet by mouth daily.         . ICAPS MV PO TABS   Oral   Take 1 tablet by mouth daily.         Marland Kitchen NITROFURANTOIN MONOHYD MACRO 100 MG PO CAPS   Oral   Take 1 capsule (100 mg total) by mouth daily.   30 capsule   11   . POLYETHYLENE GLYCOL 3350 PO PACK   Oral   Take 17 g by mouth daily as needed.         Marland Kitchen  SIMVASTATIN 20 MG PO TABS   Oral   Take 20 mg by mouth every evening.         Marland Kitchen TIOTROPIUM BROMIDE MONOHYDRATE 18 MCG IN CAPS   Inhalation   Place 18 mcg into inhaler and inhale daily.         Marland Kitchen VALSARTAN-HYDROCHLOROTHIAZIDE 160-12.5 MG PO TABS   Oral   Take 1 tablet by mouth daily.         Marland Kitchen VITAMIN D (ERGOCALCIFEROL) 50000 UNITS PO CAPS   Oral   Take 50,000 Units by mouth every 30 (thirty) days.           BP 149/57  Pulse 98  Temp 100 F (37.8 C) (Oral)  Resp 20  SpO2 85%  Physical Exam  Nursing note and vitals reviewed. Constitutional: She is oriented to person, place, and time. She appears well-developed and well-nourished. No distress.  HENT:  Head: Normocephalic and atraumatic.  Eyes: Conjunctivae normal and EOM are normal.  Cardiovascular: Normal rate and regular rhythm.   Murmur heard. Pulmonary/Chest: Effort normal. No stridor. No respiratory distress. She has decreased breath sounds.  Abdominal: She exhibits no distension.  Musculoskeletal: She exhibits no edema.  Neurological: She is alert and oriented to person, place, and time. No cranial nerve deficit.  Skin: Skin is warm and dry.  Psychiatric: She has a normal mood and affect.    ED Course  Procedures (including critical care time)   Labs Reviewed  COMPREHENSIVE METABOLIC PANEL  LACTIC ACID, PLASMA  PROTIME-INR  URINALYSIS, ROUTINE W REFLEX MICROSCOPIC  CBC WITH DIFFERENTIAL   No results found.   No diagnosis found.  O2- 91% w [place] abnormal  I interpreted the patient's x-ray, no overt significant changes, though the right cardial area appears more opaque.  On repeat exam the patient remains stable.  No new complaints  MDM  This elderly female presents from nursing home with concerns of fever, altered mental status.  On exam the patient is largely stable, though she  is one episode of decreased interactivity during the interview.  There are no focal neurologic deficits.  Given the  patient's increased oxygen requirement, some concern exists for COPD exacerbation.  Though early infection is a consideration.  She was admitted for further evaluation and management  Carmin Muskrat, MD 03/16/12 1249

## 2012-03-16 NOTE — ED Notes (Signed)
Patient being transported to CT.will obtain blood for labs upon patient's return

## 2012-03-16 NOTE — ED Notes (Signed)
[id number] Expected date:<BR> Expected time:<BR> Means of arrival:<BR> Comments:<BR> EMS 79 y/o with fever

## 2012-03-16 NOTE — ED Notes (Addendum)
Pt 79y/o from [facility] assisted living for c/o fever spiked this am pt alert and oriented temp was 100.3 on home o2

## 2012-03-17 DIAGNOSIS — E039 Hypothyroidism, unspecified: Secondary | ICD-10-CM | POA: Diagnosis not present

## 2012-03-17 DIAGNOSIS — E785 Hyperlipidemia, unspecified: Secondary | ICD-10-CM | POA: Diagnosis not present

## 2012-03-17 DIAGNOSIS — J309 Allergic rhinitis, unspecified: Secondary | ICD-10-CM | POA: Diagnosis not present

## 2012-03-17 DIAGNOSIS — J441 Chronic obstructive pulmonary disease with (acute) exacerbation: Secondary | ICD-10-CM | POA: Diagnosis not present

## 2012-03-17 DIAGNOSIS — I10 Essential (primary) hypertension: Secondary | ICD-10-CM | POA: Diagnosis not present

## 2012-03-17 DIAGNOSIS — F05 Delirium due to known physiological condition: Secondary | ICD-10-CM | POA: Diagnosis not present

## 2012-03-17 LAB — CBC
MCH: 30.8 pg (ref 26.0–34.0)
MCV: 90.3 fL (ref 78.0–100.0)
Platelets: 218 10*3/uL (ref 150–400)
RBC: 3.6 MIL/uL — ABNORMAL LOW (ref 3.87–5.11)
RDW: 13.3 % (ref 11.5–15.5)
WBC: 8.8 10*3/uL (ref 4.0–10.5)

## 2012-03-17 LAB — BASIC METABOLIC PANEL
Calcium: 8.3 mg/dL — ABNORMAL LOW (ref 8.4–10.5)
Creatinine, Ser: 0.98 mg/dL (ref 0.50–1.10)
GFR calc Af Amer: 56 mL/min — ABNORMAL LOW (ref >90)
Sodium: 134 mEq/L — ABNORMAL LOW (ref 135–145)

## 2012-03-17 MED ORDER — POTASSIUM CHLORIDE CRYS ER 20 MEQ PO TBCR
20.0000 meq | EXTENDED_RELEASE_TABLET | Freq: Three times a day (TID) | ORAL | Status: AC
  Administered 2012-03-17 (×3): 20 meq via ORAL
  Filled 2012-03-17 (×3): qty 1

## 2012-03-17 MED ORDER — METHYLPREDNISOLONE SODIUM SUCC 125 MG IJ SOLR
80.0000 mg | Freq: Two times a day (BID) | INTRAMUSCULAR | Status: DC
  Administered 2012-03-17 – 2012-03-18 (×2): 80 mg via INTRAVENOUS
  Filled 2012-03-17 (×4): qty 1.28

## 2012-03-17 MED ORDER — AMLODIPINE BESYLATE 5 MG PO TABS
5.0000 mg | ORAL_TABLET | Freq: Every day | ORAL | Status: DC
  Administered 2012-03-17 – 2012-03-20 (×4): 5 mg via ORAL
  Filled 2012-03-17 (×4): qty 1

## 2012-03-17 NOTE — Progress Notes (Addendum)
Clinical Social Work Department CLINICAL SOCIAL WORK PLACEMENT NOTE 03/17/2012  Patient:  Katherine [name], Katherine [name]  Account Number:  [id number] Admit date:  03/16/2012  Clinical Social Worker:  Ulyess Blossom  Date/time:  03/17/2012 04:30 PM  Clinical Social Work is seeking post-discharge placement for this patient at the following level of care:   SKILLED NURSING   (*CSW will update this form in Epic as items are completed)     Patient/family provided with [place] Department of Clinical Social Work's list of facilities offering this level of care within the geographic area requested by the patient (or if unable, by the patient's family).    Patient/family informed of their freedom to choose among providers that offer the needed level of care, that participate in Medicare, Medicaid or managed care program needed by the patient, have an available bed and are willing to accept the patient.    Patient/family informed of [facility]' ownership interest in [facility], as well as of the fact that they are under no obligation to receive care at this facility.  PASARR submitted to EDS on 03/17/2012 PASARR number received from EDS on 03/17/2012  FL2 transmitted to all facilities in geographic area requested by pt/family on  03/17/2012 FL2 transmitted to all facilities within larger geographic area on   Patient informed that his/her managed care company has contracts with or will negotiate with  certain facilities, including the following:     Patient/family informed of bed offers received: 03/20/2012  Patient chooses bed at [facility] Physician recommends and patient chooses bed at    Patient to be transferred to  on  [facility] on 03/20/2012 Patient to be transferred to facility by pt family via private vehicle  The following physician request were entered in Epic:   Additional Comments: Referral made to [facility] SNF as pt is from [place]  03/20/12- [facility] does not have bed availability; pt faxed out and pt chooses [facility].    Drake Leach, MSW, [place] Work [phone]

## 2012-03-17 NOTE — Progress Notes (Signed)
Clinical Social Work Department BRIEF PSYCHOSOCIAL ASSESSMENT 03/17/2012  Patient:  Katherine [name], Katherine [name]     Account Number:  [id number]     Admit date:  03/16/2012  Clinical Social Worker:  Ulyess Blossom  Date/Time:  03/17/2012 11:30 AM  Referred by:  Physician  Date Referred:  03/17/2012 Referred for  Other - See comment   Other Referral:   Pt admitted from [place] type:  Patient Other interview type:    PSYCHOSOCIAL DATA Living Status:  FACILITY Admitted from facility:  [facility] Level of care:  Independent Living Primary support name:  Herbie Baltimore Cromer/nephew Primary support relationship to patient:  FAMILY Degree of support available:   adequate    CURRENT CONCERNS Current Concerns  Other - See comment   Other Concerns:   Pt admitted from [place] / PLAN CSW received referral that pt admitted from [place].    CSW met with pt at bedside re: disposition plan. Pt confirmed she is a resident of [facility] discussed planned for PT to evaluate pt to determine discharge needs.    CSW will follow up with recommendations from PT and assist as appropriate.   Assessment/plan status:  Psychosocial Support/Ongoing Assessment of Needs Other assessment/ plan:   discharge planning   Information/referral to community resources:   Referral to [facility] for possible home health needs    PATIENT'S/FAMILY'S RESPONSE TO PLAN OF CARE: Pt alert and oriented x 4. Pt is hopeful to be able to return to ILF apartment and eager to participate with PT today.     Drake Leach, MSW, [place] Work [phone]

## 2012-03-17 NOTE — Evaluation (Signed)
Physical Therapy Evaluation Patient Details Name: Katherine [name] MRN: [id number] DOB: August 04, 1917 Today's Date: 03/17/2012 Time: [id number] PT Time Calculation (min): 35 min  PT Assessment / Plan / Recommendation Clinical Impression  79 yo female admitted with fever , AMS, COPD exacerbation.  Pt is able to ambulate with RW with min assist but she is very dyspeic, feels weak and required extra time with frequent  rest breaks.  She will need 24/7 assist and benefit from continued PT at SNF prior to d/c to her apt    PT Assessment  Patient needs continued PT services    Follow Up Recommendations  SNF    Does the patient have the potential to tolerate intense rehabilitation      Barriers to Discharge        Equipment Recommendations  None recommended by PT    Recommendations for Other Services     Frequency Min 3X/week    Precautions / Restrictions Precautions Precautions: Fall Restrictions Weight Bearing Restrictions: No   Pertinent Vitals/Pain No c/ pain, but pt is dyspneic on exertion      Mobility  Bed Mobility Bed Mobility: Sit to Supine Sit to Supine: 4: Min assist Transfers Transfers: Sit to Stand;Stand to Sit Sit to Stand: 4: Min assist Stand to Sit: 4: Min assist Ambulation/Gait Ambulation/Gait Assistance: 4: Min assist Ambulation/Gait Assistance Details: pt needs assist to direct RW.  Pt is dyspneic with exertions "I'm weak"  "I don't think I can go back to my apartment today" Gait Pattern: Step-through pattern Gait velocity: decreased General Gait Details: Pt is dypneic on exertion even with supplemental O2 Stairs: No Wheelchair Mobility Wheelchair Mobility: No    Shoulder Instructions     Exercises     PT Diagnosis: Difficulty walking;Generalized weakness;Abnormality of gait  PT Problem List: Cardiopulmonary status limiting activity;Decreased activity tolerance;Decreased strength PT Treatment Interventions: Gait training;Functional mobility  training;Therapeutic activities;Therapeutic exercise;Patient/family education   PT Goals Acute Rehab PT Goals PT Goal Formulation: With patient Time For Goal Achievement: 03/24/12 Potential to Achieve Goals: Good Pt will go Supine/Side to Sit: Independently PT Goal: Supine/Side to Sit - Progress: Goal set today Pt will go Sit to Supine/Side: Independently PT Goal: Sit to Supine/Side - Progress: Goal set today Pt will go Sit to Stand: with modified independence PT Goal: Sit to Stand - Progress: Goal set today Pt will go Stand to Sit: with modified independence PT Goal: Stand to Sit - Progress: Goal set today Pt will Stand: Independently;3 - 5 min PT Goal: Stand - Progress: Goal set today Pt will Ambulate: 51 - 150 feet;with modified independence;with least restrictive assistive device PT Goal: Ambulate - Progress: Goal set today  Visit Information  Last PT Received On: 03/17/12 Assistance Needed: +1    Subjective Data  Subjective: "They are so great there"  re: rehab section at [place] Patient Stated Goal: to get stronger so she can go back to her apt.   Prior Functioning  Home Living Lives With: Alone Type of Home: Apartment ([facility]dependent Living at [facility]) Home Layout: One level [place]: Walker - four wheeled;Grab bars around toilet Additional Comments: pt uses O2 at home Prior Function Level of Independence: Independent with assistive device(s) Communication Communication: No difficulties    Cognition  Overall Cognitive Status: Appears within functional limits for tasks assessed/performed Arousal/Alertness: Awake/alert Orientation Level: Appears intact for tasks assessed Behavior During Session: [facility] for tasks performed    Extremity/Trunk Assessment Right Lower Extremity Assessment RLE ROM/Strength/Tone: [facility]  for tasks assessed Left Lower Extremity Assessment LLE ROM/Strength/Tone: [facility] for tasks assessed   Balance Balance Balance Assessed:  Yes Static Sitting Balance Static Sitting - Balance Support: Bilateral upper extremity supported;No upper extremity supported Static Sitting - Level of Assistance: 7: Independent Static Sitting - Comment/# of Minutes: sat on edge of bed for several minutes to brush teeth Static Standing Balance Static Standing - Balance Support: No upper extremity supported;Bilateral upper extremity supported Static Standing - Level of Assistance: 5: Stand by assistance Static Standing - Comment/# of Minutes: pt able to stand at sink and wash hands  End of Session PT - End of Session Equipment Utilized During Treatment: Gait belt;Oxygen Activity Tolerance: Treatment limited secondary to medical complications (Comment);Other (comment) (pt dyspneic on exertion with decreased O2 sats) Patient left: in bed;with family/visitor present;with call bell/phone within reach Nurse Communication: Mobility status  GPTeresa K. Reader, [place]     03/17/2012, 4:18 PM

## 2012-03-17 NOTE — Clinical Documentation Improvement (Signed)
   RENAL FAILURE DOCUMENTATION CLARIFICATION QUERY  THIS DOCUMENT IS NOT A PERMANENT PART OF THE MEDICAL RECORD  TO RESPOND TO THE THIS QUERY, FOLLOW THE INSTRUCTIONS BELOW:  1. If needed, update documentation for the patient's encounter via the notes activity.  2. Access this query again and click edit on the In [place].  3. After updating, or not, click F2 to complete all highlighted (required) fields concerning your review. Select "additional documentation in the medical record" OR "no additional documentation provided".  4. Click Sign note button.  5. The deficiency will fall out of your In Basket *Please let us know if you are not able to complete this workflow by phone or e-mail (listed below).  Please update your documentation within the medical record to reflect your response to this query.                                                                                    03/17/12  Dear Dr. Laurann Montana, J  In a better effort to capture your patient's severity of illness, reflect appropriate length of stay and utilization of resources, a review of the patient medical record has revealed the following indicators.    Based on your clinical judgment, please clarify and document in a progress note and/or discharge summary the clinical condition associated with the following supporting information:  In responding to this query please exercise your independent judgment.  The fact that a query is asked, does not imply that any particular answer is desired or expected.  According to H/P pt with COPD and on home O2   Clarification Needed    Please clarify if pt's COPD with use of home O2 and Albuterol can qualify for one of the diagnoses listed below and document in pn or d/c summary.    Possible Clinical Conditions?  _______Acute Renal Failure _______Acute Kidney Injury _______Acute Tubular Necrosis _______Acute Renal Cortical Necrosis _______Acute Renal Medullary  Necrosis _______Acute on Chronic Renal Failure _______Chronic Renal Failure _______Anuria _______Oliguria _______Other Condition_____________ _______Cannot Clinically Determine     Supporting Information: COPD/ Acute bronchitis/AMS/Hyothyrodism/HTN/poss flu.  Risk Factors:  Signs and Symptoms:  Diagnostics:   Treatments: Listed above                  You may use possible, probable, or suspect with inpatient documentation. possible, probable, suspected diagnoses MUST be documented at the time of discharge  Reviewed:  no additional documentation provided ljh  Thank You,  [place]. Ulanda Edison RN, BSN, MSN/Inf, CCDS Clinical Documentation Specialist [facility] Dept Pager: [phone]

## 2012-03-17 NOTE — Progress Notes (Signed)
Subjective: No complaints tomorrow  Objective: Vital signs in last 24 hours: Temp:  [97.7 F (36.5 C)-100 F (37.8 C)] 97.7 F (36.5 C) (01/30 2055) Pulse Rate:  [79-98] 79  (01/30 2055) Resp:  [16-20] 16  (01/30 2055) BP: (142-165)/(52-58) 142/52 mmHg (01/30 2055) SpO2:  [85 %-98 %] 95 % (01/31 0259) Weight:  [62 kg (136 lb 11 oz)] 62 kg (136 lb 11 oz) (01/30 1437) Weight change:  Last BM Date: 03/15/12  Intake/Output from previous day: 01/30 0701 - 01/31 0700 In: 240 [P.O.:240] Out: -  Intake/Output this shift: Total I/O In: 240 [P.O.:240] Out: -   General appearance: alert and cooperative Resp: wheezes bilaterally Cardio: regular rate and rhythm, S1, S2 normal, no murmur, click, rub or gallop GI: soft, non-tender; bowel sounds normal; no masses,  no organomegaly Extremities: extremities normal, atraumatic, no cyanosis or edema  Lab Results:  Basename 03/17/12 0411 03/16/12 1100  WBC 8.8 16.5*  HGB 11.1* 11.9*  HCT 32.5* 35.5*  PLT 218 248   BMET  Basename 03/17/12 0411 03/16/12 1100  NA 134* 130*  K 3.1* 3.5  CL 98 94*  CO2 25 25  GLUCOSE 199* 123*  BUN 19 20  CREATININE 0.98 0.98  CALCIUM 8.3* 8.7    Studies/Results: Dg Chest 2 View  03/16/2012  *RADIOLOGY REPORT*  Clinical Data: Fever and cough.  Altered level of consciousness. Shortness of breath.  CHEST - 2 VIEW  Comparison: 07/26/2011  Findings: Mild cardiomegaly is stable as well as diffuse pulmonary interstitial prominence.  Changes of COPD are also seen.  No definite evidence of acute infiltrate or edema.  No evidence of pleural effusion.  Prominent epicardial fat pad again seen in the right cardiophrenic angle.  Lower thoracic spine degenerative disc disease is demonstrated as well as thoracic levoscoliosis.  IMPRESSION:  1.  Stable cardiomegaly, COPD, and chronic pulmonary interstitial prominence. 2.  No acute lung disease. 3.  Prominent epicardial fat pad again noted.   Original Report  Authenticated By: Earle Gell, M.D.    Ct Head Wo Contrast  03/16/2012  *RADIOLOGY REPORT*  Clinical Data: Fever and altered level of consciousness.  CT HEAD WITHOUT CONTRAST  Technique:  Contiguous axial images were obtained from the base of the skull through the vertex without contrast.  Comparison: MRI brain 05/13/2008.  Findings: Atrophy and extensive white matter changes are present. Remote lacunar infarcts of the basal ganglia are similar to the prior study.  No acute cortical infarct, hemorrhage, or mass lesion is present.  The ventricles are proportionate to the degree of atrophy.  Atherosclerotic calcifications are present within the cavernous carotid arteries bilaterally.  The paranasal sinuses and mastoid air cells are clear.  The osseous skull is intact.  IMPRESSION:  1.  Stable atrophy and diffuse white matter disease. 2.  No acute intracranial abnormality. 3.  Atherosclerosis.   Original Report Authenticated By: San Morelle, M.D.     Medications: I have reviewed the patient's current medications.  Assessment/Plan: Principal Problem:  *COPD (chronic obstructive pulmonary disease) with acute bronchitis improved, decrease steroids and continue antibiotics, bronchodilaters and O2, OOB and PT consult Active Problems:  Hypertension ok on meds Hypokalemia replete po  Altered mental status secondary to fever, resolved    LOS: 1 day   Bonny Vanleeuwen JOSEPH 03/17/2012, 6:29 AM

## 2012-03-17 NOTE — Progress Notes (Signed)
Clinical Social Worker received notification from PT that pt needing rehab at skilled level of care prior to returning to independent living level of care. Clinical Social Worker contacted [facility] and left message with SNF whose message stated that social worker at SNF was out of office today Friday 1/31. Clinical Social Worker will follow up with facility on Monday. Clinical Social Worker to continue to follow and assist with discharge plan to SNF.  Drake Leach, MSW, [place] Work [phone]

## 2012-03-18 DIAGNOSIS — E785 Hyperlipidemia, unspecified: Secondary | ICD-10-CM | POA: Diagnosis not present

## 2012-03-18 DIAGNOSIS — E039 Hypothyroidism, unspecified: Secondary | ICD-10-CM | POA: Diagnosis not present

## 2012-03-18 DIAGNOSIS — J309 Allergic rhinitis, unspecified: Secondary | ICD-10-CM | POA: Diagnosis not present

## 2012-03-18 DIAGNOSIS — I10 Essential (primary) hypertension: Secondary | ICD-10-CM | POA: Diagnosis not present

## 2012-03-18 DIAGNOSIS — J441 Chronic obstructive pulmonary disease with (acute) exacerbation: Secondary | ICD-10-CM | POA: Diagnosis not present

## 2012-03-18 DIAGNOSIS — F05 Delirium due to known physiological condition: Secondary | ICD-10-CM | POA: Diagnosis not present

## 2012-03-18 LAB — BASIC METABOLIC PANEL
Calcium: 8.3 mg/dL — ABNORMAL LOW (ref 8.4–10.5)
GFR calc Af Amer: 49 mL/min — ABNORMAL LOW (ref >90)
GFR calc non Af Amer: 43 mL/min — ABNORMAL LOW (ref >90)
Potassium: 4.4 mEq/L (ref 3.5–5.1)
Sodium: 137 mEq/L (ref 135–145)

## 2012-03-18 MED ORDER — PREDNISONE 10 MG PO TABS
60.0000 mg | ORAL_TABLET | Freq: Every day | ORAL | Status: DC
  Filled 2012-03-18 (×2): qty 1

## 2012-03-18 MED ORDER — PREDNISONE 10 MG PO TABS
60.0000 mg | ORAL_TABLET | Freq: Every day | ORAL | Status: DC
  Administered 2012-03-19 – 2012-03-20 (×2): 60 mg via ORAL
  Filled 2012-03-18 (×3): qty 1

## 2012-03-18 MED ORDER — LEVOFLOXACIN 750 MG PO TABS
750.0000 mg | ORAL_TABLET | ORAL | Status: DC
  Administered 2012-03-19: 750 mg via ORAL
  Filled 2012-03-18: qty 1

## 2012-03-18 MED ORDER — LEVOTHYROXINE SODIUM 88 MCG PO TABS
88.0000 ug | ORAL_TABLET | Freq: Every day | ORAL | Status: DC
  Administered 2012-03-19 – 2012-03-20 (×2): 88 ug via ORAL
  Filled 2012-03-18 (×3): qty 1

## 2012-03-18 NOTE — Progress Notes (Signed)
Subjective: Feels better  Objective: Vital signs in last 24 hours: Temp:  [97.7 F (36.5 C)-98.2 F (36.8 C)] 98.2 F (36.8 C) (02/01 0520) Pulse Rate:  [89-95] 89  (02/01 0520) Resp:  [16-17] 16  (02/01 0520) BP: (121-136)/(44-62) 125/44 mmHg (02/01 0520) SpO2:  [84 %-95 %] 95 % (02/01 0520) Weight change:  Last BM Date: 03/17/12  Intake/Output from previous day: 01/31 0701 - 02/01 0700 In: 1080 [P.O.:1080] Out: -  Intake/Output this shift:    General appearance: alert and cooperative Resp: wheezes bilaterally Cardio: regular rate and rhythm, S1, S2 normal, no murmur, click, rub or gallop Extremities: extremities normal, atraumatic, no cyanosis or edema  Lab Results:  Basename 03/17/12 0411 03/16/12 1100  WBC 8.8 16.5*  HGB 11.1* 11.9*  HCT 32.5* 35.5*  PLT 218 248   BMET  Basename 03/18/12 0404 03/17/12 0411  NA 137 134*  K 4.4 3.1*  CL 103 98  CO2 28 25  GLUCOSE 164* 199*  BUN 26* 19  CREATININE 1.09 0.98  CALCIUM 8.3* 8.3*    Studies/Results: Dg Chest 2 View  03/16/2012  *RADIOLOGY REPORT*  Clinical Data: Fever and cough.  Altered level of consciousness. Shortness of breath.  CHEST - 2 VIEW  Comparison: 07/26/2011  Findings: Mild cardiomegaly is stable as well as diffuse pulmonary interstitial prominence.  Changes of COPD are also seen.  No definite evidence of acute infiltrate or edema.  No evidence of pleural effusion.  Prominent epicardial fat pad again seen in the right cardiophrenic angle.  Lower thoracic spine degenerative disc disease is demonstrated as well as thoracic levoscoliosis.  IMPRESSION:  1.  Stable cardiomegaly, COPD, and chronic pulmonary interstitial prominence. 2.  No acute lung disease. 3.  Prominent epicardial fat pad again noted.   Original Report Authenticated By: Earle Gell, M.D.    Ct Head Wo Contrast  03/16/2012  *RADIOLOGY REPORT*  Clinical Data: Fever and altered level of consciousness.  CT HEAD WITHOUT CONTRAST  Technique:   Contiguous axial images were obtained from the base of the skull through the vertex without contrast.  Comparison: MRI brain 05/13/2008.  Findings: Atrophy and extensive white matter changes are present. Remote lacunar infarcts of the basal ganglia are similar to the prior study.  No acute cortical infarct, hemorrhage, or mass lesion is present.  The ventricles are proportionate to the degree of atrophy.  Atherosclerotic calcifications are present within the cavernous carotid arteries bilaterally.  The paranasal sinuses and mastoid air cells are clear.  The osseous skull is intact.  IMPRESSION:  1.  Stable atrophy and diffuse white matter disease. 2.  No acute intracranial abnormality. 3.  Atherosclerosis.   Original Report Authenticated By: San Morelle, M.D.     Medications: I have reviewed the patient's current medications.  Assessment/Plan: Principal Problem:  *COPD (chronic obstructive pulmonary disease) with acute bronchitis improved, decrease steroids and continue antibiotics, bronchodilaters and O2 Active Problems:  Hypertension ok on meds  Hypokalemia repleted  Altered mental status secondary to fever, resolved Disposition  Plan is for [facility]    LOS: 2 days   Katherine [name] JOSEPH 03/18/2012, 8:23 AM

## 2012-03-19 DIAGNOSIS — J441 Chronic obstructive pulmonary disease with (acute) exacerbation: Secondary | ICD-10-CM | POA: Diagnosis not present

## 2012-03-19 DIAGNOSIS — F05 Delirium due to known physiological condition: Secondary | ICD-10-CM | POA: Diagnosis not present

## 2012-03-19 DIAGNOSIS — J309 Allergic rhinitis, unspecified: Secondary | ICD-10-CM | POA: Diagnosis not present

## 2012-03-19 DIAGNOSIS — I10 Essential (primary) hypertension: Secondary | ICD-10-CM | POA: Diagnosis not present

## 2012-03-19 DIAGNOSIS — E785 Hyperlipidemia, unspecified: Secondary | ICD-10-CM | POA: Diagnosis not present

## 2012-03-19 DIAGNOSIS — E039 Hypothyroidism, unspecified: Secondary | ICD-10-CM | POA: Diagnosis not present

## 2012-03-19 LAB — BASIC METABOLIC PANEL
Chloride: 102 mEq/L (ref 96–112)
GFR calc Af Amer: 57 mL/min — ABNORMAL LOW (ref >90)
GFR calc non Af Amer: 49 mL/min — ABNORMAL LOW (ref >90)
Potassium: 4.2 mEq/L (ref 3.5–5.1)
Sodium: 136 mEq/L (ref 135–145)

## 2012-03-19 NOTE — Progress Notes (Signed)
Subjective: Feels better  Objective: Vital signs in last 24 hours: Temp:  [97.7 F (36.5 C)-98.5 F (36.9 C)] 98.3 F (36.8 C) (02/02 0609) Pulse Rate:  [76-87] 76  (02/02 0609) Resp:  [16-18] 16  (02/02 0609) BP: (116-139)/(35-52) 138/46 mmHg (02/02 0609) SpO2:  [90 %-96 %] 92 % (02/02 0609) Weight change:  Last BM Date: 03/17/12  Intake/Output from previous day: 02/01 0701 - 02/02 0700 In: 720 [P.O.:720] Out: -  Intake/Output this shift:    General appearance: alert and cooperative Resp: wheezes bilaterally Cardio: regular rate and rhythm, S1, S2 normal, no murmur, click, rub or gallop  Lab Results:  Basename 03/17/12 0411 03/16/12 1100  WBC 8.8 16.5*  HGB 11.1* 11.9*  HCT 32.5* 35.5*  PLT 218 248   BMET  Basename 03/19/12 0422 03/18/12 0404  NA 136 137  K 4.2 4.4  CL 102 103  CO2 28 28  GLUCOSE 126* 164*  BUN 25* 26*  CREATININE 0.97 1.09  CALCIUM 8.4 8.3*    Studies/Results: No results found.  Medications: I have reviewed the patient's current medications.  Assessment/Plan: Principal Problem:  *COPD (chronic obstructive pulmonary disease) with acute bronchitis improved, decreased steroids and continue antibiotics, bronchodilaters and O2  Active Problems:  Hypertension ok on meds  Hypokalemia repleted  Altered mental status secondary to fever, resolved  Disposition Plan is for [facility]    LOS: 3 days   Loral Campi JOSEPH 03/19/2012, 8:43 AM

## 2012-03-20 DIAGNOSIS — E038 Other specified hypothyroidism: Secondary | ICD-10-CM | POA: Diagnosis not present

## 2012-03-20 DIAGNOSIS — M6281 Muscle weakness (generalized): Secondary | ICD-10-CM | POA: Diagnosis not present

## 2012-03-20 DIAGNOSIS — E039 Hypothyroidism, unspecified: Secondary | ICD-10-CM | POA: Diagnosis not present

## 2012-03-20 DIAGNOSIS — R279 Unspecified lack of coordination: Secondary | ICD-10-CM | POA: Diagnosis not present

## 2012-03-20 DIAGNOSIS — M810 Age-related osteoporosis without current pathological fracture: Secondary | ICD-10-CM | POA: Diagnosis not present

## 2012-03-20 DIAGNOSIS — E785 Hyperlipidemia, unspecified: Secondary | ICD-10-CM | POA: Diagnosis not present

## 2012-03-20 DIAGNOSIS — M1990 Unspecified osteoarthritis, unspecified site: Secondary | ICD-10-CM | POA: Diagnosis not present

## 2012-03-20 DIAGNOSIS — J4489 Other specified chronic obstructive pulmonary disease: Secondary | ICD-10-CM | POA: Diagnosis not present

## 2012-03-20 DIAGNOSIS — J40 Bronchitis, not specified as acute or chronic: Secondary | ICD-10-CM | POA: Diagnosis not present

## 2012-03-20 DIAGNOSIS — F05 Delirium due to known physiological condition: Secondary | ICD-10-CM | POA: Diagnosis not present

## 2012-03-20 DIAGNOSIS — I150 Renovascular hypertension: Secondary | ICD-10-CM | POA: Diagnosis not present

## 2012-03-20 DIAGNOSIS — N189 Chronic kidney disease, unspecified: Secondary | ICD-10-CM | POA: Diagnosis not present

## 2012-03-20 DIAGNOSIS — J449 Chronic obstructive pulmonary disease, unspecified: Secondary | ICD-10-CM | POA: Diagnosis not present

## 2012-03-20 DIAGNOSIS — I10 Essential (primary) hypertension: Secondary | ICD-10-CM | POA: Diagnosis not present

## 2012-03-20 DIAGNOSIS — J309 Allergic rhinitis, unspecified: Secondary | ICD-10-CM | POA: Diagnosis not present

## 2012-03-20 DIAGNOSIS — J441 Chronic obstructive pulmonary disease with (acute) exacerbation: Secondary | ICD-10-CM | POA: Diagnosis not present

## 2012-03-20 DIAGNOSIS — E638 Other specified nutritional deficiencies: Secondary | ICD-10-CM | POA: Diagnosis not present

## 2012-03-20 MED ORDER — LEVOTHYROXINE SODIUM 88 MCG PO TABS: 88.0000 ug | ORAL_TABLET | Freq: Every day | ORAL | Status: DC

## 2012-03-20 MED ORDER — LEVOFLOXACIN 750 MG PO TABS: 750.0000 mg | ORAL_TABLET | ORAL | Status: DC

## 2012-03-20 MED ORDER — IPRATROPIUM BROMIDE 0.02 % IN SOLN
0.5000 mg | Freq: Three times a day (TID) | RESPIRATORY_TRACT | Status: DC
  Administered 2012-03-20: 0.5 mg via RESPIRATORY_TRACT
  Filled 2012-03-20: qty 2.5

## 2012-03-20 MED ORDER — PREDNISONE 20 MG PO TABS: 20.0000 mg | ORAL_TABLET | Freq: Every day | ORAL | Status: DC

## 2012-03-20 MED ORDER — ALBUTEROL SULFATE (5 MG/ML) 0.5% IN NEBU
2.5000 mg | INHALATION_SOLUTION | Freq: Three times a day (TID) | RESPIRATORY_TRACT | Status: DC
  Administered 2012-03-20: 2.5 mg via RESPIRATORY_TRACT
  Filled 2012-03-20: qty 0.5

## 2012-03-20 MED ORDER — ALBUTEROL SULFATE (5 MG/ML) 0.5% IN NEBU: 2.5000 mg | INHALATION_SOLUTION | Freq: Four times a day (QID) | RESPIRATORY_TRACT | Status: DC | PRN

## 2012-03-20 NOTE — Progress Notes (Signed)
Subjective: No complaints, she did ambulate yesterday without much shortness of breath  Objective: Vital signs in last 24 hours: Temp:  [97.8 F (36.6 C)-98.2 F (36.8 C)] 98 F (36.7 C) (02/03 0606) Pulse Rate:  [77-78] 77  (02/03 0606) Resp:  [16-18] 16  (02/03 0606) BP: (112-145)/(40-49) 145/45 mmHg (02/03 0606) SpO2:  [93 %-95 %] 95 % (02/03 0606) Weight change:  Last BM Date: 03/17/12  Intake/Output from previous day: 02/02 0701 - 02/03 0700 In: 480 [P.O.:480] Out: -  Intake/Output this shift:    Resp: wheezes bilaterally Cardio: regular rate and rhythm, S1, S2 normal, no murmur, click, rub or gallop  Lab Results: No results found for this basename: WBC:2,HGB:2,HCT:2,PLT:2 in the last 72 hours BMET  Basename 03/19/12 0422 03/18/12 0404  NA 136 137  K 4.2 4.4  CL 102 103  CO2 28 28  GLUCOSE 126* 164*  BUN 25* 26*  CREATININE 0.97 1.09  CALCIUM 8.4 8.3*    Studies/Results: No results found.  Medications: I have reviewed the patient's current medications.  Assessment/Plan: Principal Problem:  *COPD (chronic obstructive pulmonary disease) with acute bronchitis improved, decreased steroids and continue antibiotics, bronchodilaters and O2  Active Problems:  Hypertension ok on meds  Hypokalemia repleted  Altered mental status secondary to fever, resolved  Disposition Plan is for [facility]. She is medically ready for discharge today   LOS: 4 days   Katherine [name] JOSEPH 03/20/2012, 7:20 AM

## 2012-03-20 NOTE — Progress Notes (Signed)
CSW facilitated pt discharge needs to [facility].  Discharge packet provided to pt and pt family at bedside, discharge information faxed via [facility], RN provided phone number to call report, and pt family is transporting pt via private vehicle.  No further CSW needs identified at this time. CSW signing off.   Drake Leach, MSW, [place] Work [phone]

## 2012-03-20 NOTE — Progress Notes (Signed)
CSW was notified that [facility] did not have a skilled bed for rehab available today.  CSW discussed with pt and pt agreeable to SNF search in [facility].  CSW initiated SNF search and gave pt bed offers.  Pt chooses bed at [facility].  CSW confirmed bed availability for today.   CSW to facilitate pt discharge needs this afternoon. Pt family plans to transport via private vehicle.  Drake Leach, MSW, [place] Work [phone]

## 2012-03-20 NOTE — Discharge Summary (Addendum)
Physician Discharge Summary  Patient ID: Katherine [name] MRN: [id number] DOB/AGE: Nov 07, 1917 79 y.o.  Admit date: 03/16/2012 Discharge date: 03/20/2012  Admission Diagnoses: COPD exacerbation COPD Hypertension  acute confusion Chronic kidney disease stage III Osteoporosis Hyperlipidemia Hypothyroidism Seasonal allergic rhinitis Frequent urinary tract infections Right knee DJD  Discharge Diagnoses:  Principal Problem:  COPD exacerbation Active Problems:  Hypertension Acute confusion Chronic kidney disease stage III Osteoporosis Hyperlipidemia Hypothyroidism 6 allergic rhinitis Frequent urinary tract infections Right knee DJD   Discharged Condition: good  Hospital Course: The patient was admitted on 03/16/2012 from her independent living facility at friend's home [place] after falling twice were tried and leg. She was confused and not oriented. She reported having a cough with whitish phlegm for 2 weeks prior and had developed fever and chills the day before it [place] along with a cough. In the emergency room she was found to have a oxygen saturation of 85% at 2 L nasal cannula and leukocytosis a temperature 0.5. Chest x-ray showed changes COPD without acute infiltrates. Lab work showed sodium 1:30, potassium 3.5, BUN 20, creatinine 0.9, albumin 2.9, WBC 16.5, hemoglobin 11.9 platelet count 248. The patient was admitted and placed on IV steroids, bronchodilators and Levaquin. She felt better within 24 hours. Her dyspnea improved. Still has some coughing and mild wheezing at discharge but was ambulating without a great deal shortness of breath. Her steroids were changed to by mouth. She was seen by physical therapy and a short stay at rehabilitation was felt to be appropriate for strengthening. Because of her kidney function her Macrodantin was discontinued. Her confusion resolved within one day of hospitalization. Hypokalemia was corrected.  NO Code Blue Diet.  Low sodium Activity ad  lib, per PT  Consults: None  Significant Diagnostic Studies: labs: As above. At discharge sodium 136, potassium 4.2, BUN 25, creatinine 0.97. CBC WBC 8.8 hemoglobin 11.1. and radiology: CXR: Cardiomegaly, COPD, no active lung disease  Treatments: antibiotics: Levaquin, steroids: solu-medrol and prednisone, respiratory therapy: O2 and albuterol/atropine nebulizer and therapies: PT  Discharge Exam: Blood pressure 145/45, pulse 77, temperature 98 F (36.7 C), temperature source Oral, resp. rate 16, height 4\' 6"  (1.372 m), weight 62 kg (136 lb 11 oz), SpO2 95.00%. Resp: wheezes bilaterally Cardio: regular rate and rhythm, S1, S2 normal, no murmur, click, rub or gallop  Disposition: 06-Home-Health Care Svc  Discharge Orders    Future Appointments: Provider: Department: Dept Phone: Center:   01/04/2013 1:00 PM [facility] [phone] [facility]   01/04/2013 2:00 PM Princess Perna, NP [facility] [phone] [facility]       Medication List     As of 03/20/2012  7:14 AM    STOP taking these medications         nitrofurantoin (macrocrystal-monohydrate) 100 MG capsule   Commonly known as: MACROBID      TAKE these medications         albuterol (5 MG/ML) 0.5% nebulizer solution   Commonly known as: PROVENTIL   Take 0.5 mLs (2.5 mg total) by nebulization every 6 (six) hours as needed for wheezing.      amLODipine 5 MG tablet   Commonly known as: NORVASC   Take 5 mg by mouth daily.      aspirin EC 81 MG tablet   Take 81 mg by mouth daily.      azelastine 137 MCG/SPRAY nasal spray   Commonly known as: ASTELIN   Place 1 spray  into the nose 2 (two) times daily. Use in each nostril as directed      CALTRATE 600+D PO   Take 1 tablet by mouth 2 (two) times daily.      CENTRUM SILVER PO   Take 1 tablet by mouth daily.      ICaps MV Tabs   Take 1 tablet by mouth daily.      fexofenadine 180 MG tablet   Commonly known  as: ALLEGRA   Take 180 mg by mouth daily.      fluticasone 50 MCG/ACT nasal spray   Commonly known as: FLONASE   Place 2 sprays into the nose daily.      Fluticasone-Salmeterol 250-50 MCG/DOSE Aepb   Commonly known as: ADVAIR   Inhale 1 puff into the lungs every 12 (twelve) hours.      levofloxacin 750 MG tablet   Commonly known as: LEVAQUIN   Take 1 tablet (750 mg total) by mouth every other day.      levothyroxine 88 MCG tablet   Commonly known as: SYNTHROID, LEVOTHROID   Take 1 tablet (88 mcg total) by mouth daily before breakfast.      polyethylene glycol packet   Commonly known as: MIRALAX / GLYCOLAX   Take 17 g by mouth daily as needed.      predniSONE 20 MG tablet   Commonly known as: DELTASONE   Take 1 tablet (20 mg total) by mouth daily with breakfast.      simvastatin 20 MG tablet   Commonly known as: ZOCOR   Take 20 mg by mouth every evening.      tiotropium 18 MCG inhalation capsule   Commonly known as: SPIRIVA   Place 18 mcg into inhaler and inhale daily.      valsartan-hydrochlorothiazide 160-12.5 MG per tablet   Commonly known as: DIOVAN-HCT   Take 1 tablet by mouth daily.      Vitamin D (Ergocalciferol) 50000 UNITS Caps   Commonly known as: DRISDOL   Take 50,000 Units by mouth every 30 (thirty) days.           Follow-up Information    Follow up with Irven Shelling, MD. In 2 weeks.   Contact information:   [address], [address] [place], [place]  [address] [phone]          Signed: Irven Shelling 03/20/2012, 7:14 AM

## 2012-03-21 DIAGNOSIS — J449 Chronic obstructive pulmonary disease, unspecified: Secondary | ICD-10-CM | POA: Diagnosis not present

## 2012-03-21 DIAGNOSIS — I150 Renovascular hypertension: Secondary | ICD-10-CM | POA: Diagnosis not present

## 2012-03-21 DIAGNOSIS — E039 Hypothyroidism, unspecified: Secondary | ICD-10-CM | POA: Diagnosis not present

## 2012-03-22 LAB — CULTURE, BLOOD (ROUTINE X 2): Culture: NO GROWTH

## 2012-04-03 DIAGNOSIS — Z7982 Long term (current) use of aspirin: Secondary | ICD-10-CM | POA: Diagnosis not present

## 2012-04-03 DIAGNOSIS — J189 Pneumonia, unspecified organism: Secondary | ICD-10-CM | POA: Diagnosis not present

## 2012-04-03 DIAGNOSIS — I10 Essential (primary) hypertension: Secondary | ICD-10-CM | POA: Diagnosis not present

## 2012-04-03 DIAGNOSIS — J209 Acute bronchitis, unspecified: Secondary | ICD-10-CM | POA: Diagnosis not present

## 2012-04-03 DIAGNOSIS — IMO0001 Reserved for inherently not codable concepts without codable children: Secondary | ICD-10-CM | POA: Diagnosis not present

## 2012-04-03 DIAGNOSIS — R5381 Other malaise: Secondary | ICD-10-CM | POA: Diagnosis not present

## 2012-04-04 DIAGNOSIS — IMO0001 Reserved for inherently not codable concepts without codable children: Secondary | ICD-10-CM | POA: Diagnosis not present

## 2012-04-04 DIAGNOSIS — R5383 Other fatigue: Secondary | ICD-10-CM | POA: Diagnosis not present

## 2012-04-04 DIAGNOSIS — Z7982 Long term (current) use of aspirin: Secondary | ICD-10-CM | POA: Diagnosis not present

## 2012-04-04 DIAGNOSIS — I10 Essential (primary) hypertension: Secondary | ICD-10-CM | POA: Diagnosis not present

## 2012-04-04 DIAGNOSIS — J209 Acute bronchitis, unspecified: Secondary | ICD-10-CM | POA: Diagnosis not present

## 2012-04-04 DIAGNOSIS — J189 Pneumonia, unspecified organism: Secondary | ICD-10-CM | POA: Diagnosis not present

## 2012-04-11 DIAGNOSIS — Z7982 Long term (current) use of aspirin: Secondary | ICD-10-CM | POA: Diagnosis not present

## 2012-04-12 DIAGNOSIS — J189 Pneumonia, unspecified organism: Secondary | ICD-10-CM | POA: Diagnosis not present

## 2012-04-12 DIAGNOSIS — I10 Essential (primary) hypertension: Secondary | ICD-10-CM | POA: Diagnosis not present

## 2012-04-12 DIAGNOSIS — R5381 Other malaise: Secondary | ICD-10-CM | POA: Diagnosis not present

## 2012-04-13 DIAGNOSIS — IMO0001 Reserved for inherently not codable concepts without codable children: Secondary | ICD-10-CM | POA: Diagnosis not present

## 2012-04-17 DIAGNOSIS — IMO0001 Reserved for inherently not codable concepts without codable children: Secondary | ICD-10-CM | POA: Diagnosis not present

## 2012-04-19 DIAGNOSIS — IMO0001 Reserved for inherently not codable concepts without codable children: Secondary | ICD-10-CM | POA: Diagnosis not present

## 2012-04-24 DIAGNOSIS — B351 Tinea unguium: Secondary | ICD-10-CM | POA: Diagnosis not present

## 2012-04-28 DIAGNOSIS — J189 Pneumonia, unspecified organism: Secondary | ICD-10-CM | POA: Diagnosis not present

## 2012-05-03 DIAGNOSIS — Z7982 Long term (current) use of aspirin: Secondary | ICD-10-CM | POA: Diagnosis not present

## 2012-05-08 DIAGNOSIS — E039 Hypothyroidism, unspecified: Secondary | ICD-10-CM | POA: Diagnosis not present

## 2012-06-26 DIAGNOSIS — B351 Tinea unguium: Secondary | ICD-10-CM | POA: Diagnosis not present

## 2012-08-28 DIAGNOSIS — J449 Chronic obstructive pulmonary disease, unspecified: Secondary | ICD-10-CM | POA: Diagnosis not present

## 2012-08-28 DIAGNOSIS — Z1331 Encounter for screening for depression: Secondary | ICD-10-CM | POA: Diagnosis not present

## 2012-08-28 DIAGNOSIS — Z0000 Encounter for general adult medical examination without abnormal findings: Secondary | ICD-10-CM | POA: Diagnosis not present

## 2012-08-28 DIAGNOSIS — I10 Essential (primary) hypertension: Secondary | ICD-10-CM | POA: Diagnosis not present

## 2012-08-28 DIAGNOSIS — K219 Gastro-esophageal reflux disease without esophagitis: Secondary | ICD-10-CM | POA: Diagnosis not present

## 2012-08-31 ENCOUNTER — Other Ambulatory Visit: Payer: Self-pay

## 2012-08-31 DIAGNOSIS — Z85828 Personal history of other malignant neoplasm of skin: Secondary | ICD-10-CM | POA: Diagnosis not present

## 2012-08-31 DIAGNOSIS — C44519 Basal cell carcinoma of skin of other part of trunk: Secondary | ICD-10-CM | POA: Diagnosis not present

## 2012-08-31 DIAGNOSIS — C50019 Malignant neoplasm of nipple and areola, unspecified female breast: Secondary | ICD-10-CM | POA: Diagnosis not present

## 2012-08-31 DIAGNOSIS — L821 Other seborrheic keratosis: Secondary | ICD-10-CM | POA: Diagnosis not present

## 2012-08-31 DIAGNOSIS — L570 Actinic keratosis: Secondary | ICD-10-CM | POA: Diagnosis not present

## 2012-09-04 DIAGNOSIS — B351 Tinea unguium: Secondary | ICD-10-CM | POA: Diagnosis not present

## 2012-09-14 DIAGNOSIS — N6459 Other signs and symptoms in breast: Secondary | ICD-10-CM | POA: Diagnosis not present

## 2012-09-29 ENCOUNTER — Telehealth (INDEPENDENT_AMBULATORY_CARE_PROVIDER_SITE_OTHER): Payer: Self-pay

## 2012-09-29 NOTE — Telephone Encounter (Signed)
Called [name] b/c they had asked if the pt could be seen on Monday 8/18 instead of her appt on 8/22. I asked Dr Donne Hazel yesterday and he agreed to start his office early on 8/18 the pt would need to check in at 8:00 for 8:15. [name] stated that she would have to check with the pt and call me back.  [name] called me back to let us know the pt can not come in early now on Monday the pt wants to keep her appt the same for 8/22. I will notify Dr Donne Hazel.

## 2012-10-06 ENCOUNTER — Ambulatory Visit (INDEPENDENT_AMBULATORY_CARE_PROVIDER_SITE_OTHER): Payer: Medicare Other | Admitting: General Surgery

## 2012-10-06 ENCOUNTER — Telehealth (INDEPENDENT_AMBULATORY_CARE_PROVIDER_SITE_OTHER): Payer: Self-pay

## 2012-10-06 ENCOUNTER — Encounter (INDEPENDENT_AMBULATORY_CARE_PROVIDER_SITE_OTHER): Payer: Self-pay | Admitting: General Surgery

## 2012-10-06 VITALS — BP 132/72 | HR 80 | Temp 98.0°F | Resp 18 | Ht 64.0 in | Wt 116.0 lb

## 2012-10-06 DIAGNOSIS — C50919 Malignant neoplasm of unspecified site of unspecified female breast: Secondary | ICD-10-CM | POA: Diagnosis not present

## 2012-10-06 DIAGNOSIS — C50012 Malignant neoplasm of nipple and areola, left female breast: Secondary | ICD-10-CM

## 2012-10-06 NOTE — Telephone Encounter (Signed)
Called and added pt to breast cancer conference for 10/11/12.

## 2012-10-06 NOTE — Telephone Encounter (Signed)
Called to ask if the pathologist would run ER/PR on the derm path that was collected on 08/31/12 from pt's dermatologist on the skin bx of paget's disease. Tammy will check with Dr Saralyn Pilar to see if he would do this for [place]. If any problems she will call me back but if I don't hear back from her then they will run the ER/PR.

## 2012-10-06 NOTE — Progress Notes (Signed)
Patient ID: Katherine [name], female   DOB: [birth year], 79 y.o.   MRN: [id number]  Chief Complaint  Patient presents with  . New Evaluation    Left breast    HPI Katherine [name] is a 79 y.o. female.  Referred by Dr Lavone Orn HPI 46 yof with history of copd ( she has admissions in last couple years for pna and bronchitis she says) who is been in usual state of health.  She was seen recently by Dr Fontaine No and was noted to have left nipple changes.  She notes no breast mass on this side and has not noted any nipple discharge.  She underwent shave biopsy of this nipple with finding of Pagets disease (no other information on the path report right now).  She comes in today to discuss her options. Since then she has undergone mm at [facility] with Dr Isaiah Blakes that shows homogeneously dense breasts.  No abnormalities on the right.  On the left there is a tiny group of microcalcifications less than 2 mm in diamter in 11 oclock retroareolar location.  This is 2.6 mm deep to the nipple and thought to be benign.  [place] of entire parareolar and retroareolar area is normal.  This is read as Birads 1 mm.  Past Medical History  Diagnosis Date  . Thyroid disease   . Hypertension   . Hyperlipemia   . Multiple allergies   . COPD (chronic obstructive pulmonary disease)   . Hypothyroidism   . Pneumonia   . Stroke 2010    no residual  . GERD (gastroesophageal reflux disease)   . Arthritis   . History of shingles 2007  . Constipation   . Osteoporosis   . Fatigue   . Seasonal allergies   . Frequent UTI   . Uterine cancer     Past Surgical History  Procedure Laterality Date  . Abdominal hysterectomy    . Breast lumpectomy      left breast  . Carotid endarterectomy    . Tonsillectomy    . Eye surgery      cataracts removed bilaterally    Family History  Problem Relation Age of Onset  . Asthma Paternal Grandmother   . Heart failure Mother   . Heart failure Father     Social History History    Substance Use Topics  . Smoking status: Former Smoker -- 1.00 packs/day for 40 years    Types: Cigarettes    Quit date: 02/15/1981  . Smokeless tobacco: Never Used  . Alcohol Use: Yes    Allergies  Allergen Reactions  . Amoxicillin   . Sulfa Antibiotics     SOB  . Penicillins Rash    Current Outpatient Prescriptions  Medication Sig Dispense Refill  . albuterol (PROVENTIL) (5 MG/ML) 0.5% nebulizer solution Take 0.5 mLs (2.5 mg total) by nebulization every 6 (six) hours as needed for wheezing.  20 mL  0  . amLODipine (NORVASC) 5 MG tablet Take 5 mg by mouth daily.      Marland Kitchen aspirin EC 81 MG tablet Take 81 mg by mouth daily.      Marland Kitchen azelastine (ASTELIN) 137 MCG/SPRAY nasal spray Place 1 spray into the nose 2 (two) times daily. Use in each nostril as directed      . Calcium Carbonate-Vitamin D (CALTRATE 600+D PO) Take 1 tablet by mouth 2 (two) times daily.      . fexofenadine (ALLEGRA) 180 MG tablet Take 180 mg by mouth daily.      Marland Kitchen  fluticasone (FLONASE) 50 MCG/ACT nasal spray Place 2 sprays into the nose daily.      . Fluticasone-Salmeterol (ADVAIR) 250-50 MCG/DOSE AEPB Inhale 1 puff into the lungs every 12 (twelve) hours.      Marland Kitchen levothyroxine (SYNTHROID, LEVOTHROID) 88 MCG tablet Take 1 tablet (88 mcg total) by mouth daily before breakfast.  30 tablet  11  . Multiple Vitamins-Minerals (CENTRUM SILVER PO) Take 1 tablet by mouth daily.      . Multiple Vitamins-Minerals (ICAPS MV) TABS Take 1 tablet by mouth daily.      . simvastatin (ZOCOR) 20 MG tablet Take 20 mg by mouth every evening.      . tiotropium (SPIRIVA) 18 MCG inhalation capsule Place 18 mcg into inhaler and inhale daily.      . valsartan-hydrochlorothiazide (DIOVAN-HCT) 160-12.5 MG per tablet Take 1 tablet by mouth daily.      . Vitamin D, Ergocalciferol, (DRISDOL) 50000 UNITS CAPS Take 50,000 Units by mouth every 30 (thirty) days.      Marland Kitchen levofloxacin (LEVAQUIN) 750 MG tablet Take 1 tablet (750 mg total) by mouth every other  day.  2 tablet  0  . polyethylene glycol (MIRALAX / GLYCOLAX) packet Take 17 g by mouth daily as needed.      . predniSONE (DELTASONE) 20 MG tablet Take 1 tablet (20 mg total) by mouth daily with breakfast.  10 tablet  0   No current facility-administered medications for this visit.    Review of Systems Review of Systems  Constitutional: Negative for fever, chills and unexpected weight change.  HENT: Negative for hearing loss, congestion, sore throat, trouble swallowing and voice change.   Eyes: Negative for visual disturbance.  Respiratory: Negative for cough and wheezing.   Cardiovascular: Negative for chest pain, palpitations and leg swelling.  Gastrointestinal: Negative for nausea, vomiting, abdominal pain, diarrhea, constipation, blood in stool, abdominal distention and anal bleeding.  Genitourinary: Negative for hematuria, vaginal bleeding and difficulty urinating.  Musculoskeletal: Positive for arthralgias.  Skin: Negative for rash and wound.  Neurological: Negative for seizures, syncope and headaches.  Hematological: Negative for adenopathy. Does not bruise/bleed easily.  Psychiatric/Behavioral: Negative for confusion.    Blood pressure 132/72, pulse 80, temperature 98 F (36.7 C), resp. rate 18, height 5\' 4"  (1.626 m), weight 116 lb (52.617 kg).  Physical Exam Physical Exam  Vitals reviewed. Constitutional: She appears well-developed and well-nourished.  Eyes: No scleral icterus.  Neck: Neck supple.  Cardiovascular: Normal rate, regular rhythm and normal heart sounds.   Pulmonary/Chest: Effort normal and breath sounds normal. She has no wheezes. She has no rales. Right breast exhibits no inverted nipple, no mass, no nipple discharge, no skin change and no tenderness. Left breast exhibits skin change. Left breast exhibits no inverted nipple, no mass, no nipple discharge and no tenderness.    Abdominal: Soft. There is no hepatomegaly.  Lymphadenopathy:    She has no  cervical adenopathy.    She has no axillary adenopathy.       Right: No supraclavicular adenopathy present.       Left: No supraclavicular adenopathy present.    Data Reviewed Mm and path reports reviewed  Assessment    Pagets disease left nipple  Plan    We discussed diagnosis as well as possible underlying carcinoma.  She has no evidence of this on mm or exam today though.  This area is asymptomatic.  I am going to try and get er/pr on tissue obtained already.  We discussed pathophysiology  of breast cancer, pagets disease and natural history.  Options at age 50 with other cormorbidities with goal of keeping her quality of life but also avoiding issues with the pagets disease include close follow up and observation, possible antiestrogen therapy if that ends up being an option or surgery likely done under local or local mac.  I think discussion at multidisciplinary conference, awaiting receptors prior to any decision would be prudent and I will plan on seeing her back after this information is available.  Her nephew was here for conversation.        Jonte Wollam 10/06/2012, 10:54 AM

## 2012-10-17 ENCOUNTER — Telehealth (INDEPENDENT_AMBULATORY_CARE_PROVIDER_SITE_OTHER): Payer: Self-pay | Admitting: General Surgery

## 2012-10-17 NOTE — Telephone Encounter (Signed)
Pt calling to ask outcomes/ recommendations of [facility] meeting last week.  Aware Dr. Donne Hazel not available today.

## 2012-10-18 ENCOUNTER — Telehealth (INDEPENDENT_AMBULATORY_CARE_PROVIDER_SITE_OTHER): Payer: Self-pay

## 2012-10-18 DIAGNOSIS — C50012 Malignant neoplasm of nipple and areola, left female breast: Secondary | ICD-10-CM

## 2012-10-18 NOTE — Telephone Encounter (Signed)
Placed order in epic for the medical oncology appt ASAP.

## 2012-10-19 ENCOUNTER — Telehealth: Payer: Self-pay | Admitting: *Deleted

## 2012-10-19 NOTE — Telephone Encounter (Signed)
Confirmed 10/23/12 appt w pt.  Mailed before appt letter & packet to pt.  Emailed Lars Mage and Dr. Donne Hazel at [facility] to make them aware.  Took paperwork to Med [facility] for chart.

## 2012-10-23 ENCOUNTER — Encounter: Payer: Self-pay | Admitting: Oncology

## 2012-10-23 ENCOUNTER — Telehealth: Payer: Self-pay | Admitting: *Deleted

## 2012-10-23 ENCOUNTER — Other Ambulatory Visit: Payer: Self-pay | Admitting: Emergency Medicine

## 2012-10-23 ENCOUNTER — Ambulatory Visit: Payer: Medicare Other

## 2012-10-23 ENCOUNTER — Other Ambulatory Visit (HOSPITAL_BASED_OUTPATIENT_CLINIC_OR_DEPARTMENT_OTHER): Payer: Medicare Other | Admitting: Lab

## 2012-10-23 ENCOUNTER — Ambulatory Visit (HOSPITAL_BASED_OUTPATIENT_CLINIC_OR_DEPARTMENT_OTHER): Payer: Medicare Other | Admitting: Oncology

## 2012-10-23 VITALS — BP 153/61 | HR 96 | Temp 97.7°F | Resp 18 | Ht 64.0 in | Wt 121.9 lb

## 2012-10-23 DIAGNOSIS — C50919 Malignant neoplasm of unspecified site of unspecified female breast: Secondary | ICD-10-CM

## 2012-10-23 DIAGNOSIS — M810 Age-related osteoporosis without current pathological fracture: Secondary | ICD-10-CM | POA: Diagnosis not present

## 2012-10-23 DIAGNOSIS — C50912 Malignant neoplasm of unspecified site of left female breast: Secondary | ICD-10-CM

## 2012-10-23 LAB — CBC WITH DIFFERENTIAL/PLATELET
Basophils Absolute: 0 10*3/uL (ref 0.0–0.1)
EOS%: 1.2 % (ref 0.0–7.0)
Eosinophils Absolute: 0.1 10*3/uL (ref 0.0–0.5)
HGB: 10.7 g/dL — ABNORMAL LOW (ref 11.6–15.9)
MCV: 91.4 fL (ref 79.5–101.0)
MONO%: 11.6 % (ref 0.0–14.0)
NEUT#: 6.9 10*3/uL — ABNORMAL HIGH (ref 1.5–6.5)
RBC: 3.48 10*6/uL — ABNORMAL LOW (ref 3.70–5.45)
RDW: 13.8 % (ref 11.2–14.5)
lymph#: 2 10*3/uL (ref 0.9–3.3)

## 2012-10-23 LAB — COMPREHENSIVE METABOLIC PANEL (CC13)
AST: 15 U/L (ref 5–34)
Albumin: 3.2 g/dL — ABNORMAL LOW (ref 3.5–5.0)
Alkaline Phosphatase: 62 U/L (ref 40–150)
BUN: 26.8 mg/dL — ABNORMAL HIGH (ref 7.0–26.0)
Calcium: 9.3 mg/dL (ref 8.4–10.4)
Chloride: 100 mEq/L (ref 98–109)
Potassium: 3.8 mEq/L (ref 3.5–5.1)
Sodium: 140 mEq/L (ref 136–145)
Total Protein: 6.4 g/dL (ref 6.4–8.3)

## 2012-10-23 MED ORDER — EXEMESTANE 25 MG PO TABS: 25.0000 mg | ORAL_TABLET | Freq: Every day | ORAL | Status: DC

## 2012-10-23 NOTE — Patient Instructions (Addendum)
We discussed your diagnosis and treatment options  We discussed use anti-estrogen therapy with aromasin 25 mg daily for 5 yers to prevent the cancer from growing  We discussed the side effects of side effects of aromasin  We discussed getting bone density to see if you have osteopenia or osteoporosis.  I will see you  Back in 3 months  Exemestane tablets What is this medicine? EXEMESTANE (ex e MES tane) blocks the production of the hormone estrogen. Some types of breast cancer depend on estrogen to grow, and this medicine can stop tumor growth by blocking estrogen production. This medicine is for the treatment of breast cancer in postmenopausal women only. This medicine may be used for other purposes; ask your health care provider or pharmacist if you have questions. What should I tell my health care provider before I take this medicine? They need to know if you have any of these conditions: -an unusual or allergic reaction to exemestane, other medicines, foods, dyes, or preservatives -pregnant or trying to get pregnant -breast-feeding How should I use this medicine? Take this medicine by mouth with a glass of water. Follow the directions on the prescription label. Take your doses at regular intervals after a meal. Do not take your medicine more often than directed. Do not stop taking except on the advice of your doctor or health care professional. Contact your pediatrician regarding the use of this medicine in children. Special care may be needed. Overdosage: If you think you have taken too much of this medicine contact a poison control center or emergency room at once. NOTE: This medicine is only for you. Do not share this medicine with others. What if I miss a dose? If you miss a dose, take the next dose as usual. Do not try to make up the missed dose. Do not take double or extra doses. What may interact with this medicine? Do not take this medicine with any of the following  medications: -female hormones, like estrogens and birth control pills This medicine may also interact with the following medications: -androstenedione -phenytoin -rifabutin, rifampin, or rifapentine -St. John's Wort This list may not describe all possible interactions. Give your health care provider a list of all the medicines, herbs, non-prescription drugs, or dietary supplements you use. Also tell them if you smoke, drink alcohol, or use illegal drugs. Some items may interact with your medicine. What should I watch for while using this medicine? Visit your doctor or health care professional for regular checks on your progress. If you experience hot flashes or sweating while taking this medicine, avoid alcohol, smoking and drinks with caffeine. This may help to decrease these side effects. What side effects may I notice from receiving this medicine? Side effects that you should report to your doctor or health care professional as soon as possible: -any new or unusual symptoms -changes in vision -fever -leg or arm swelling -pain in bones, joints, or muscles -pain in hips, back, ribs, arms, shoulders, or legs Side effects that usually do not require medical attention (report to your doctor or health care professional if they continue or are bothersome): -difficulty sleeping -headache -hot flashes -sweating -unusually weak or tired This list may not describe all possible side effects. Call your doctor for medical advice about side effects. You may report side effects to FDA at 1-800-FDA-1088. Where should I keep my medicine? Keep out of the reach of children. Store at room temperature between 15 and 30 degrees C (59 and 86 degrees F).  Throw away any unused medicine after the expiration date. NOTE: This sheet is a summary. It may not cover all possible information. If you have questions about this medicine, talk to your doctor, pharmacist, or health care provider.  2013, [employer]. (06/06/2007 11:48:29 AM)

## 2012-10-23 NOTE — Progress Notes (Signed)
Checked in new pt with no financial concerns. °

## 2012-10-23 NOTE — Telephone Encounter (Signed)
appts made and printed. [facility]v appt for [facility] 11/01/12@ 1pm...td

## 2012-10-30 DIAGNOSIS — Z961 Presence of intraocular lens: Secondary | ICD-10-CM | POA: Diagnosis not present

## 2012-10-30 DIAGNOSIS — H353190 Nonexudative age-related macular degeneration, unspecified eye, stage unspecified: Secondary | ICD-10-CM | POA: Diagnosis not present

## 2012-11-01 DIAGNOSIS — Z780 Asymptomatic menopausal state: Secondary | ICD-10-CM | POA: Diagnosis not present

## 2012-11-01 DIAGNOSIS — Z853 Personal history of malignant neoplasm of breast: Secondary | ICD-10-CM | POA: Diagnosis not present

## 2012-11-05 NOTE — Progress Notes (Signed)
Katherine [name] [id number] [birth year] 79 y.o. 11/05/2012 1:53 AM  CC  Irven Shelling, MD [address], [address] [employer] [address], [place]. [place] [address] Dr. Rolm Bookbinder  REASON FOR CONSULTATION:  79 year old female with new diagnosis of Paget's disease with underlying carcinoma   STAGE:   No matching staging information was found for the patient.  REFERRING PHYSICIAN: Dr. Rolm Bookbinder  HISTORY OF PRESENT ILLNESS:  Katherine [name] is a 78 y.o. female.  With a history of COPD and pneumonia. Recently she was noted to have a left nipple change. No rest masses no discharge. Patient underwent a shave biopsy that showed Paget's disease. She was seen by Dr. Rolm Bookbinder and she was referred to [facility] for mammogram that showed homogeneously dense breasts no abnormalities on the right side. On the left there was a tiny group of microcalcifications less than 2 mm at the 11:00 retroareolar location. This was deep to the nipple and thought to be benign.  After an extensive discussion patient was referred to medical oncology. Of note the Paget's disease skin biopsy was tested for the estrogen receptor and progesterone receptor and was ER positive PR negative.  Patient is without any complaints she is accompanied by her nephew   Past Medical History: Past Medical History  Diagnosis Date  . Thyroid disease   . Hypertension   . Hyperlipemia   . Multiple allergies   . COPD (chronic obstructive pulmonary disease)   . Hypothyroidism   . Pneumonia   . Stroke 2010    no residual  . GERD (gastroesophageal reflux disease)   . Arthritis   . History of shingles 2007  . Constipation   . Osteoporosis   . Fatigue   . Seasonal allergies   . Frequent UTI   . Uterine cancer     Past Surgical History: Past Surgical History  Procedure Laterality Date  . Abdominal hysterectomy    . Breast lumpectomy      left breast  . Carotid endarterectomy    .  Tonsillectomy    . Eye surgery      cataracts removed bilaterally    Family History: Family History  Problem Relation Age of Onset  . Asthma Paternal Grandmother   . Heart failure Mother   . Heart failure Father     Social History History  Substance Use Topics  . Smoking status: Former Smoker -- 1.00 packs/day for 40 years    Types: Cigarettes    Quit date: 02/15/1981  . Smokeless tobacco: Never Used  . Alcohol Use: Yes    Allergies: Allergies  Allergen Reactions  . Amoxicillin   . Sulfa Antibiotics     SOB  . Penicillins Rash    Current Medications: Current Outpatient Prescriptions  Medication Sig Dispense Refill  . amLODipine (NORVASC) 5 MG tablet Take 5 mg by mouth daily.      Marland Kitchen aspirin EC 81 MG tablet Take 81 mg by mouth daily.      Marland Kitchen azelastine (ASTELIN) 137 MCG/SPRAY nasal spray Place 1 spray into the nose 2 (two) times daily. Use in each nostril as directed      . Calcium Carbonate-Vitamin D (CALTRATE 600+D PO) Take 1 tablet by mouth 2 (two) times daily.      Marland Kitchen esomeprazole (NEXIUM) 40 MG capsule Take 40 mg by mouth daily before breakfast.      . fexofenadine (ALLEGRA) 180 MG tablet Take 180 mg by mouth daily.      Marland Kitchen  fluticasone (FLONASE) 50 MCG/ACT nasal spray Place 2 sprays into the nose daily.      . Fluticasone-Salmeterol (ADVAIR) 250-50 MCG/DOSE AEPB Inhale 1 puff into the lungs every 12 (twelve) hours.      Marland Kitchen levothyroxine (SYNTHROID, LEVOTHROID) 88 MCG tablet Take 1 tablet (88 mcg total) by mouth daily before breakfast.  30 tablet  11  . Multiple Vitamins-Minerals (CENTRUM SILVER PO) Take 1 tablet by mouth daily.      . Multiple Vitamins-Minerals (ICAPS MV) TABS Take 1 tablet by mouth daily.      . simvastatin (ZOCOR) 20 MG tablet Take 20 mg by mouth every evening.      . tiotropium (SPIRIVA) 18 MCG inhalation capsule Place 18 mcg into inhaler and inhale daily.      . valsartan-hydrochlorothiazide (DIOVAN-HCT) 160-12.5 MG per tablet Take 1 tablet by mouth  daily.      . Vitamin D, Ergocalciferol, (DRISDOL) 50000 UNITS CAPS Take 50,000 Units by mouth every 30 (thirty) days.      Marland Kitchen exemestane (AROMASIN) 25 MG tablet Take 1 tablet (25 mg total) by mouth daily after breakfast.  30 tablet  6   No current facility-administered medications for this visit.        ECOG PERFORMANCE STATUS: 1 - Symptomatic but completely ambulatory  Genetic Counseling/testing: no  REVIEW OF SYSTEMS:  Pertinent items are noted in HPI.  PHYSICAL EXAMINATION: Blood pressure 153/61, pulse 96, temperature 97.7 F (36.5 C), temperature source Oral, resp. rate 18, height 5\' 4"  (1.626 m), weight 121 lb 14.4 oz (55.293 kg).  [id number], healthy, pale and smiling SKIN: skin color, texture, turgor are normal HEAD: Normocephalic EYES: PERRLA, EOMI EARS: External ears normal OROPHARYNX:no exudate and no erythema  NECK: no adenopathy LYMPH:  no palpable lymphadenopathy, no hepatosplenomegaly BREAST:right breast normal without mass, skin or nipple changes or axillary nodes, no palpable masses in the left breast the nipple area LOC complex noted to have skin changes nontender LUNGS: clear to auscultation  HEART: regular rate & rhythm ABDOMEN:abdomen soft, non-tender, normal bowel sounds and no masses or organomegaly BACK: Back symmetric, no curvature. EXTREMITIES:no edema, no clubbing, no cyanosis  NEURO: alert & oriented x 3 with fluent speech     STUDIES/RESULTS: No results found.   LABS:    Chemistry      Component Value Date/Time   NA 140 10/23/2012 1457   NA 136 03/19/2012 0422   K 3.8 10/23/2012 1457   K 4.2 03/19/2012 0422   CL 102 03/19/2012 0422   CO2 30* 10/23/2012 1457   CO2 28 03/19/2012 0422   BUN 26.8* 10/23/2012 1457   BUN 25* 03/19/2012 0422   CREATININE 1.4* 10/23/2012 1457   CREATININE 0.97 03/19/2012 0422      Component Value Date/Time   CALCIUM 9.3 10/23/2012 1457   CALCIUM 8.4 03/19/2012 0422   ALKPHOS 62 10/23/2012 1457   ALKPHOS 54 03/16/2012 1100    AST 15 10/23/2012 1457   AST 21 03/16/2012 1100   ALT 12 10/23/2012 1457   ALT 14 03/16/2012 1100   BILITOT 0.35 10/23/2012 1457   BILITOT 0.5 03/16/2012 1100      Lab Results  Component Value Date   WBC 10.3 10/23/2012   HGB 10.7* 10/23/2012   HCT 31.8* 10/23/2012   MCV 91.4 10/23/2012   PLT 266 10/23/2012         ASSESSMENT    79 year old female with Paget's disease that is ER positive. She most likely has an underlying  malignancy. Patient is not a good surgical risk. She is therefore sent to medical oncology for consideration of neoadjuvant antiestrogen therapy. Patient and I discussed treatment with Aromasin 25 mg daily we discussed extensively risks benefits and side effects of this. She understands and would like to proceed with this.  Clinical Trial Eligibility: no Multidisciplinary conference discussion yes     PLAN:    #1 Aromasin 25 mg daily.  #2 she will return in 3 months time for followup.      Thank you so much for allowing me to participate in the care of Katherine [name]. I will continue to follow up the patient with you and assist in her care.  All questions were answered. The patient knows to call the clinic with any problems, questions or concerns. We can certainly see the patient much sooner if necessary.  I spent 40 minutes counseling the patient face to face. The total time spent in the appointment was 55 minutes.  Marcy Panning, MD [facility] [phone] (beeper) [phone] (Office)

## 2012-11-13 DIAGNOSIS — B351 Tinea unguium: Secondary | ICD-10-CM | POA: Diagnosis not present

## 2012-11-16 DIAGNOSIS — Z23 Encounter for immunization: Secondary | ICD-10-CM | POA: Diagnosis not present

## 2012-11-27 ENCOUNTER — Encounter: Payer: Self-pay | Admitting: *Deleted

## 2012-11-27 NOTE — Progress Notes (Signed)
Mailed after appt letter to pt. 

## 2013-01-02 ENCOUNTER — Encounter: Payer: Self-pay | Admitting: Family

## 2013-01-03 ENCOUNTER — Ambulatory Visit (HOSPITAL_COMMUNITY)
Admission: RE | Admit: 2013-01-03 | Discharge: 2013-01-03 | Disposition: A | Discharge status: Home / Self Care | Payer: Medicare Other | Source: Ambulatory Visit | Attending: Family | Admitting: Family

## 2013-01-03 ENCOUNTER — Encounter (INDEPENDENT_AMBULATORY_CARE_PROVIDER_SITE_OTHER): Payer: Self-pay

## 2013-01-03 ENCOUNTER — Ambulatory Visit (INDEPENDENT_AMBULATORY_CARE_PROVIDER_SITE_OTHER): Payer: Medicare Other | Admitting: Family

## 2013-01-03 ENCOUNTER — Encounter: Payer: Self-pay | Admitting: Family

## 2013-01-03 DIAGNOSIS — I6529 Occlusion and stenosis of unspecified carotid artery: Secondary | ICD-10-CM

## 2013-01-03 DIAGNOSIS — L84 Corns and callosities: Secondary | ICD-10-CM

## 2013-01-03 DIAGNOSIS — Z48812 Encounter for surgical aftercare following surgery on the circulatory system: Secondary | ICD-10-CM | POA: Diagnosis not present

## 2013-01-03 NOTE — Patient Instructions (Signed)
Stroke Prevention Some medical conditions and behaviors are associated with an increased chance of having a stroke. You may prevent a stroke by making healthy choices and managing medical conditions. Reduce your risk of having a stroke by:  Staying physically active. Get at least 30 minutes of activity on most or all days.  Not smoking. It may also be helpful to avoid exposure to secondhand smoke.  Limiting alcohol use. Moderate alcohol use is considered to be:  No more than 2 drinks per day for men.  No more than 1 drink per day for nonpregnant women.  Eating healthy foods.  Include 5 or more servings of fruits and vegetables a day.  Certain diets may be prescribed to address high blood pressure, high cholesterol, diabetes, or obesity.  Managing your cholesterol levels.  A low-saturated fat, low-trans fat, low-cholesterol, and high-fiber diet may control cholesterol levels.  Take any prescribed medicines to control cholesterol as directed by your caregiver.  Managing your diabetes.  A controlled-carbohydrate, controlled-sugar diet is recommended to manage diabetes.  Take any prescribed medicines to control diabetes as directed by your caregiver.  Controlling your high blood pressure (hypertension).  A low-salt (sodium), low-saturated fat, low-trans fat, and low-cholesterol diet is recommended to manage high blood pressure.  Take any prescribed medicines to control hypertension as directed by your caregiver.  Maintaining a healthy weight.  A reduced-calorie, low-sodium, low-saturated fat, low-trans fat, low-cholesterol diet is recommended to manage weight.  Stopping drug abuse.  Avoiding birth control pills.  Talk to your caregiver about the risks of taking birth control pills if you are over 35 years old, smoke, get migraines, or have ever had a blood clot.  Getting evaluated for sleep disorders (sleep apnea).  Talk to your caregiver about getting a sleep evaluation  if you snore a lot or have excessive sleepiness.  Taking medicines as directed by your caregiver.  For some people, aspirin or blood thinners (anticoagulants) are helpful in reducing the risk of forming abnormal blood clots that can lead to stroke. If you have the irregular heart rhythm of atrial fibrillation, you should be on a blood thinner unless there is a good reason you cannot take them.  Understand all your medicine instructions. SEEK IMMEDIATE MEDICAL CARE IF:   You have sudden weakness or numbness of the face, arm, or leg, especially on one side of the body.  You have sudden confusion.  You have trouble speaking (aphasia) or understanding.  You have sudden trouble seeing in one or both eyes.  You have sudden trouble walking.  You have dizziness.  You have a loss of balance or coordination.  You have a sudden, severe headache with no known cause.  You have new chest pain or an irregular heartbeat. Any of these symptoms may represent a serious problem that is an emergency. Do not wait to see if the symptoms will go away. Get medical help right away. Call your local emergency services (911 in [place] Do not drive yourself to the hospital. Document Released: 03/11/2004 Document Revised: 04/26/2011 Document Reviewed: 08/04/2012 [employer] Patient Information 2014 [employer].  

## 2013-01-03 NOTE — Progress Notes (Signed)
Established Carotid Patient  History of Present Illness  Katherine [name] is a 79 y.o. female patient of Dr. Oneida Alar status post left CEA in 2010.   Patient has Positivehistory of TIA or stroke symptom in 2010 before the CEA as manifested by dizziness, no similar symptoms since then.  The patient denies amaurosis fugax or monocular blindness.  The patient  denies facial drooping.  Pt. denies hemiplegia.  The patient denies receptive or expressive aphasia.  Pt. denies extremity weakness. Denies claudication symptoms, denies non-healing wounds. Sees podiatrist for calluses.  Has right knee pain that was found to be OA per pt.   reports New Medical or Surgical History: mammogram last Sept., found to have Paget's Disease in left breast, is taking new mediation for this.  Pt Diabetic: No  Pt smoker: former smoker, quit 1983, has COPD, is on 2 L/min O2 continuous.  Pt meds include: Statin : Yes ASA: Yes Other anticoagulants/antiplatelets: no   Past Medical History  Diagnosis Date  . Thyroid disease   . Hypertension   . Hyperlipemia   . Multiple allergies   . COPD (chronic obstructive pulmonary disease)   . Hypothyroidism   . Pneumonia   . Stroke 2010    no residual  . GERD (gastroesophageal reflux disease)   . Arthritis   . History of shingles 2007  . Constipation   . Osteoporosis   . Fatigue   . Seasonal allergies   . Frequent UTI   . Uterine cancer     Social History History  Substance Use Topics  . Smoking status: Former Smoker -- 1.00 packs/day for 40 years    Types: Cigarettes    Quit date: 02/15/1981  . Smokeless tobacco: Never Used  . Alcohol Use: Yes    Family History Family History  Problem Relation Age of Onset  . Asthma Paternal Grandmother   . Heart failure Mother   . Heart disease Mother   . Heart failure Father   . Heart disease Father   . Heart disease Brother     Surgical History Past Surgical History  Procedure Laterality Date  . Abdominal  hysterectomy    . Breast lumpectomy      left breast  . Carotid endarterectomy    . Tonsillectomy    . Eye surgery      cataracts removed bilaterally    Allergies  Allergen Reactions  . Sulfa Antibiotics Palpitations    SOB  . Amoxicillin Rash  . Penicillins Rash    Current Outpatient Prescriptions  Medication Sig Dispense Refill  . amLODipine (NORVASC) 5 MG tablet Take 5 mg by mouth daily.      Marland Kitchen aspirin EC 81 MG tablet Take 81 mg by mouth daily.      Marland Kitchen azelastine (ASTELIN) 137 MCG/SPRAY nasal spray Place 1 spray into the nose 2 (two) times daily. Use in each nostril as directed      . Calcium Carbonate-Vitamin D (CALTRATE 600+D PO) Take 1 tablet by mouth 2 (two) times daily.      Marland Kitchen esomeprazole (NEXIUM) 40 MG capsule Take 40 mg by mouth daily before breakfast.      . exemestane (AROMASIN) 25 MG tablet Take 1 tablet (25 mg total) by mouth daily after breakfast.  30 tablet  6  . fexofenadine (ALLEGRA) 180 MG tablet Take 180 mg by mouth daily.      . fluticasone (FLONASE) 50 MCG/ACT nasal spray Place 2 sprays into the nose daily.      Marland Kitchen  Fluticasone-Salmeterol (ADVAIR) 250-50 MCG/DOSE AEPB Inhale 1 puff into the lungs every 12 (twelve) hours.      Marland Kitchen levothyroxine (SYNTHROID, LEVOTHROID) 88 MCG tablet Take 1 tablet (88 mcg total) by mouth daily before breakfast.  30 tablet  11  . Multiple Vitamins-Minerals (CENTRUM SILVER PO) Take 1 tablet by mouth daily.      . Multiple Vitamins-Minerals (ICAPS MV) TABS Take 1 tablet by mouth daily.      . simvastatin (ZOCOR) 20 MG tablet Take 20 mg by mouth every evening.      . tiotropium (SPIRIVA) 18 MCG inhalation capsule Place 18 mcg into inhaler and inhale daily.      . valsartan-hydrochlorothiazide (DIOVAN-HCT) 160-12.5 MG per tablet Take 1 tablet by mouth daily.      . Vitamin D, Ergocalciferol, (DRISDOL) 50000 UNITS CAPS Take 50,000 Units by mouth every 30 (thirty) days.       No current facility-administered medications for this visit.     Review of Systems : [x]  Positive   [ ]  Denies  General:[ ]  Weight loss,  [ ]  Weight gain, [ ]  Loss of appetite, [ ]  Fever, [ ]  chills  Neurologic: [ ]  Dizziness, [ ]  Blackouts, [ ]  Headaches, [ ]  Seizure [ ]  Stroke, [ ]  "Mini stroke", [ ]  Slurred speech, [ ]  Temporary blindness;  [ ] weakness,  Ear/Nose/Throat: [ ]  Change in hearing, [ ]  Nose bleeds, [ ]  Hoarseness  Vascular:[ ]  Pain in legs with walking, [ ]  Pain in feet while lying flat , [ ]   Non-healing ulcer, [ ]  Blood clot in vein,    Pulmonary: [ ]  Home oxygen, [ ]   Productive cough, [ ]  Bronchitis, [ ]  Coughing up blood,  [ ]  Asthma, [ ]  Wheezing  Musculoskeletal:  [ ]  Arthritis, [ ]  Joint pain, [ ]  low back pain  Cardiac: [ ]  Chest pain, [ ]  Shortness of breath when lying flat, [ ]  Shortness of breath with exertion, [ ]  Palpitations, [ ]  Heart murmur, [ ]   Atrial fibrillation  Hematologic:[ ]  Easy Bruising, [ ]  Anemia; [ ]  Hepatitis  Psychiatric: [ ]   Depression, [ ]  Anxiety   Gastrointestinal: [ ]  Black stool, [ ]  Blood in stool, [ ]  Peptic ulcer disease,  [ ]  Gastroesophageal Reflux, [ ]  Trouble swallowing, [ ]  Diarrhea, [ ]  Constipation  Urinary: [ ]  chronic Kidney disease, [ ]  on HD, [ ]  Burning with urination, [ ]  Frequent urination, [ ]  Difficulty urinating;   Skin: [ ]  Rashes, [ ]  Wounds    Physical Examination  Filed Vitals:   01/03/13 1154  BP: 148/61  Pulse: 76  Resp: 16    General: WDWN female in NAD GAIT: slow and deliberate, using walker Eyes: Pupils = Pulmonary:  CTAB, Negative  Rales, Negative rhonchi, & Negative wheezing, diminished air movement in all lung fields, has home portable O2.  Cardiac: Irregular Rhythm.  VASCULAR EXAM Carotid Bruits Left Right   Negative Negative    Aorta is not palpable. Radial pulses are 2+ palpable and equal.  LE Pulses LEFT RIGHT        POPLITEAL  not palpable   not palpable       POSTERIOR TIBIAL  not palpable   not palpable        DORSALIS PEDIS      ANTERIOR TIBIAL  palpable   palpable     Gastrointestinal: soft, nontender, BS WNL, no r/g,  negative masses.  Musculoskeletal: Normal muscle atrophy/wasting for age. M/S 3/5 throughout, Extremities without ischemic changes.  Neurologic: A&O X 3; Appropriate Affect ; SENSATION ;normal;  Speech is normal CN 2-12 intact  Except tongue slightly deviates to left, Pain and light touch intact in extremities, Motor exam as listed above.   Non-Invasive Vascular Imaging CAROTID DUPLEX 01/03/2013   Right ICA: <40% stenosis. Left ICA: patent CEA site.  These findings are Unchanged from previous exam.  Assessment: Katherine [name] is a 79 y.o. female who presents with asymptomatic Right ICA: <40% stenosis. Left ICA: patent CEA site.  The  ICA stenosis is  Unchanged from previous exam. Irregular heart rhythm, pt. states she is scheduled to see her her PCP next month, advised pt.to see her PCP ASAP re irregular heart rhythm. Note that pt.is not on anticoagulant or antiarrythmic.    Plan: Follow-up in 1 year with Carotid Duplex scan.   I discussed in depth with the patient the nature of atherosclerosis, and emphasized the importance of maximal medical management including strict control of blood pressure, blood glucose, and lipid levels, obtaining regular exercise, and continued cessation of smoking.  The patient is aware that without maximal medical management the underlying atherosclerotic disease process will progress, limiting the benefit of any interventions. The patient was given information about stroke prevention and what symptoms should prompt the patient to seek immediate medical care. Thank you for allowing [place] to participate in this patient's care.  Clemon Chambers, RN, MSN, FNP-C [facility] Office: [phone]  Clinic  Physician: Scot Dock  01/03/2013 12:05 PM

## 2013-01-04 ENCOUNTER — Ambulatory Visit: Payer: Medicare Other | Admitting: Neurosurgery

## 2013-01-04 ENCOUNTER — Other Ambulatory Visit: Payer: Medicare Other

## 2013-01-10 NOTE — Addendum Note (Signed)
Addended by: Thresa Ross C on: 01/10/2013 11:08 AM   Modules accepted: Orders

## 2013-01-17 ENCOUNTER — Telehealth: Payer: Self-pay | Admitting: Oncology

## 2013-02-02 ENCOUNTER — Other Ambulatory Visit: Payer: Medicare Other | Admitting: Lab

## 2013-02-02 ENCOUNTER — Ambulatory Visit: Payer: Medicare Other | Admitting: Oncology

## 2013-02-05 ENCOUNTER — Ambulatory Visit: Payer: Medicare Other | Admitting: Podiatry

## 2013-02-05 ENCOUNTER — Encounter: Payer: Self-pay | Admitting: Podiatry

## 2013-02-05 VITALS — BP 107/74 | HR 88 | Resp 16

## 2013-02-05 DIAGNOSIS — B351 Tinea unguium: Secondary | ICD-10-CM

## 2013-02-05 NOTE — Progress Notes (Signed)
Subjective:     Patient ID: Katherine [name], female   DOB: 10/29/1917, 79 y.o.   MRN: [id number]  HPI nail disease with thickness 1-5 both feet   Review of Systems     Objective:   Physical Exam Nails that are thick 1-5 both feet along with lesions on both feet    Assessment:     Nails and lesions both feet    Plan:     Debridement of nails and lesions both feet with no bleeding noted

## 2013-02-26 DIAGNOSIS — I10 Essential (primary) hypertension: Secondary | ICD-10-CM | POA: Diagnosis not present

## 2013-02-26 DIAGNOSIS — E039 Hypothyroidism, unspecified: Secondary | ICD-10-CM | POA: Diagnosis not present

## 2013-02-26 DIAGNOSIS — E785 Hyperlipidemia, unspecified: Secondary | ICD-10-CM | POA: Diagnosis not present

## 2013-02-26 DIAGNOSIS — J449 Chronic obstructive pulmonary disease, unspecified: Secondary | ICD-10-CM | POA: Diagnosis not present

## 2013-02-26 DIAGNOSIS — N183 Chronic kidney disease, stage 3 unspecified: Secondary | ICD-10-CM | POA: Diagnosis not present

## 2013-02-26 DIAGNOSIS — K219 Gastro-esophageal reflux disease without esophagitis: Secondary | ICD-10-CM | POA: Diagnosis not present

## 2013-03-16 ENCOUNTER — Telehealth: Payer: Self-pay | Admitting: Oncology

## 2013-03-16 ENCOUNTER — Other Ambulatory Visit (HOSPITAL_BASED_OUTPATIENT_CLINIC_OR_DEPARTMENT_OTHER): Payer: Medicare Other

## 2013-03-16 ENCOUNTER — Encounter: Payer: Self-pay | Admitting: Oncology

## 2013-03-16 ENCOUNTER — Ambulatory Visit (HOSPITAL_BASED_OUTPATIENT_CLINIC_OR_DEPARTMENT_OTHER): Payer: Medicare Other | Admitting: Oncology

## 2013-03-16 VITALS — BP 143/54 | HR 92 | Temp 98.0°F | Resp 18 | Ht <= 58 in | Wt 123.0 lb

## 2013-03-16 DIAGNOSIS — Z170 Estrogen receptor positive status [ER+]: Secondary | ICD-10-CM | POA: Diagnosis not present

## 2013-03-16 DIAGNOSIS — C50912 Malignant neoplasm of unspecified site of left female breast: Secondary | ICD-10-CM

## 2013-03-16 DIAGNOSIS — C50019 Malignant neoplasm of nipple and areola, unspecified female breast: Secondary | ICD-10-CM | POA: Diagnosis not present

## 2013-03-16 DIAGNOSIS — C50919 Malignant neoplasm of unspecified site of unspecified female breast: Secondary | ICD-10-CM | POA: Insufficient documentation

## 2013-03-16 HISTORY — DX: Malignant neoplasm of nipple and areola, unspecified female breast: C50.019

## 2013-03-16 LAB — CBC WITH DIFFERENTIAL/PLATELET
BASO%: 0.9 % (ref 0.0–2.0)
BASOS ABS: 0.1 10*3/uL (ref 0.0–0.1)
EOS ABS: 0.1 10*3/uL (ref 0.0–0.5)
EOS%: 1.3 % (ref 0.0–7.0)
HEMATOCRIT: 32.6 % — AB (ref 34.8–46.6)
HEMOGLOBIN: 11 g/dL — AB (ref 11.6–15.9)
LYMPH%: 22.5 % (ref 14.0–49.7)
MCH: 31 pg (ref 25.1–34.0)
MCHC: 33.8 g/dL (ref 31.5–36.0)
MCV: 91.8 fL (ref 79.5–101.0)
MONO#: 1 10*3/uL — AB (ref 0.1–0.9)
MONO%: 10.9 % (ref 0.0–14.0)
NEUT%: 64.4 % (ref 38.4–76.8)
NEUTROS ABS: 5.8 10*3/uL (ref 1.5–6.5)
PLATELETS: 300 10*3/uL (ref 145–400)
RBC: 3.55 10*6/uL — ABNORMAL LOW (ref 3.70–5.45)
RDW: 13.4 % (ref 11.2–14.5)
WBC: 9 10*3/uL (ref 3.9–10.3)
lymph#: 2 10*3/uL (ref 0.9–3.3)

## 2013-03-16 LAB — COMPREHENSIVE METABOLIC PANEL (CC13)
ALT: 14 U/L (ref 0–55)
ANION GAP: 9 meq/L (ref 3–11)
AST: 17 U/L (ref 5–34)
Albumin: 3.4 g/dL — ABNORMAL LOW (ref 3.5–5.0)
Alkaline Phosphatase: 77 U/L (ref 40–150)
BILIRUBIN TOTAL: 0.28 mg/dL (ref 0.20–1.20)
BUN: 24.2 mg/dL (ref 7.0–26.0)
CALCIUM: 9.5 mg/dL (ref 8.4–10.4)
CO2: 30 meq/L — AB (ref 22–29)
CREATININE: 1.2 mg/dL — AB (ref 0.6–1.1)
Chloride: 99 mEq/L (ref 98–109)
GLUCOSE: 134 mg/dL (ref 70–140)
Potassium: 4.5 mEq/L (ref 3.5–5.1)
Sodium: 138 mEq/L (ref 136–145)
Total Protein: 6.5 g/dL (ref 6.4–8.3)

## 2013-03-16 NOTE — Progress Notes (Signed)
OFFICE PROGRESS NOTE  CC  Katherine Shelling, MD [address], [address] [place] [address] Dr. Rolm [name]  DIAGNOSIS: 80 year old female with diagnosis of Paget's disease with underlying carcinoma   PRIOR THERAPY: #1she was noted to have a left nipple change. No rest masses no discharge. Patient underwent a shave biopsy that showed Paget's disease. She was seen by Dr. Rolm [name] and she was referred to [facility] for mammogram that showed homogeneously dense breasts no abnormalities on the right side. On the left there was a tiny group of microcalcifications less than 2 mm at the 11:00 retroareolar location. This was deep to the nipple and thought to be benign. After an extensive discussion patient was referred to medical oncology. Of note the Paget's disease skin biopsy was tested for the estrogen receptor and progesterone receptor and was ER positive PR negative  #2 Aromasin 25 mg daily adjuvantly started June 2014  CURRENT THERAPY:Aromasin 25 mg daily  INTERVAL HISTORY: Katherine [name] 80 y.o. female returns for followup visit today. Overall she's doing well. She's tolerating Aromasin without any significant problems. She is accompanied by her nephew today. She has no nausea vomiting fevers chills night sweats no aches or pains. She has no vaginal bleeding or discharge. Remainder of the 10 point review of systems is negative.  MEDICAL HISTORY: Past Medical History  Diagnosis Date  . Thyroid disease   . Hypertension   . Hyperlipemia   . Multiple allergies   . COPD (chronic obstructive pulmonary disease)   . Hypothyroidism   . Pneumonia   . Stroke 2010    no residual  . GERD (gastroesophageal reflux disease)   . Arthritis   . History of shingles 2007  . Constipation   . Osteoporosis   . Fatigue   . Seasonal allergies   . Frequent UTI   . Uterine cancer     ALLERGIES:  is allergic to sulfa antibiotics; amoxicillin; and penicillins.  MEDICATIONS:   Current Outpatient Prescriptions  Medication Sig Dispense Refill  . amLODipine (NORVASC) 5 MG tablet Take 5 mg by mouth daily.      Marland Kitchen aspirin EC 81 MG tablet Take 81 mg by mouth daily.      Marland Kitchen azelastine (ASTELIN) 137 MCG/SPRAY nasal spray Place 1 spray into the nose 2 (two) times daily. Use in each nostril as directed      . Calcium Carbonate-Vitamin D (CALTRATE 600+D PO) Take 1 tablet by mouth 2 (two) times daily.      Marland Kitchen esomeprazole (NEXIUM) 40 MG capsule Take 40 mg by mouth daily before breakfast.      . exemestane (AROMASIN) 25 MG tablet Take 1 tablet (25 mg total) by mouth daily after breakfast.  30 tablet  6  . fexofenadine (ALLEGRA) 180 MG tablet Take 180 mg by mouth daily.      . fluticasone (FLONASE) 50 MCG/ACT nasal spray Place 2 sprays into the nose daily.      . Fluticasone-Salmeterol (ADVAIR) 250-50 MCG/DOSE AEPB Inhale 1 puff into the lungs every 12 (twelve) hours.      Marland Kitchen levothyroxine (SYNTHROID, LEVOTHROID) 88 MCG tablet Take 1 tablet (88 mcg total) by mouth daily before breakfast.  30 tablet  11  . Multiple Vitamins-Minerals (CENTRUM SILVER PO) Take 1 tablet by mouth daily.      . Multiple Vitamins-Minerals (ICAPS MV) TABS Take 1 tablet by mouth daily.      . simvastatin (ZOCOR) 20 MG tablet Take 20 mg by mouth every  evening.      . tiotropium (SPIRIVA) 18 MCG inhalation capsule Place 18 mcg into inhaler and inhale daily.      . valsartan-hydrochlorothiazide (DIOVAN-HCT) 160-12.5 MG per tablet Take 1 tablet by mouth daily.      . Vitamin D, Ergocalciferol, (DRISDOL) 50000 UNITS CAPS Take 50,000 Units by mouth every 30 (thirty) days.       No current facility-administered medications for this visit.    SURGICAL HISTORY:  Past Surgical History  Procedure Laterality Date  . Abdominal hysterectomy    . Breast lumpectomy      left breast  . Carotid endarterectomy    . Tonsillectomy    . Eye surgery      cataracts removed bilaterally    REVIEW OF SYSTEMS:  Pertinent  items are noted in HPI.     PHYSICAL EXAMINATION: Blood pressure 143/54, pulse 92, temperature 98 F (36.7 C), temperature source Oral, resp. rate 18, height 4\' 8"  (1.422 m), weight 123 lb (55.792 kg). Body mass index is 27.59 kg/(m^2). ECOG PERFORMANCE STATUS: 0 - Asymptomatic   General appearance: alert, cooperative and appears stated age Lymph nodes: Cervical, supraclavicular, and axillary nodes normal. Resp: clear to auscultation bilaterally Back: symmetric, no curvature. ROM normal. No CVA tenderness. Cardio: regular rate and rhythm GI: soft, non-tender; bowel sounds normal; no masses,  no organomegaly Genitalia: defer exam Neuro: Grossly normal Breasts: lRight breast normal without mass, skin or nipple changes or axillary nodes, left nipple is red no discharge noted.    LABORATORY DATA: Lab Results  Component Value Date   WBC 9.0 03/16/2013   HGB 11.0* 03/16/2013   HCT 32.6* 03/16/2013   MCV 91.8 03/16/2013   PLT 300 03/16/2013      Chemistry      Component Value Date/Time   NA 140 10/23/2012 1457   NA 136 03/19/2012 0422   K 3.8 10/23/2012 1457   K 4.2 03/19/2012 0422   CL 102 03/19/2012 0422   CO2 30* 10/23/2012 1457   CO2 28 03/19/2012 0422   BUN 26.8* 10/23/2012 1457   BUN 25* 03/19/2012 0422   CREATININE 1.4* 10/23/2012 1457   CREATININE 0.97 03/19/2012 0422      Component Value Date/Time   CALCIUM 9.3 10/23/2012 1457   CALCIUM 8.4 03/19/2012 0422   ALKPHOS 62 10/23/2012 1457   ALKPHOS 54 03/16/2012 1100   AST 15 10/23/2012 1457   AST 21 03/16/2012 1100   ALT 12 10/23/2012 1457   ALT 14 03/16/2012 1100   BILITOT 0.35 10/23/2012 1457   BILITOT 0.5 03/16/2012 1100       RADIOGRAPHIC STUDIES:  No results found.  ASSESSMENT/PLAN: 80 year old female with Paget's disease of the nipple would DCIS that is ER positive of the left breast. Patient is currently on neoadjuvant treatment with Aromasin 25 mg daily. She's tolerating it well without any significant problems. She will continue this  indefinitely. She will be seen back in 3 months time in followup   All questions were answered. The patient knows to call the clinic with any problems, questions or concerns. We can certainly see the patient much sooner if necessary.  I spent 15 minutes counseling the patient face to face. The total time spent in the appointment was 20 minutes.    Marcy Panning, MD Medical/Oncology [facility] [phone] (beeper) [phone] (Office)  03/16/2013, 2:59 PM

## 2013-03-16 NOTE — Telephone Encounter (Signed)
, °

## 2013-04-09 ENCOUNTER — Ambulatory Visit: Payer: TRICARE For Life (TFL) | Admitting: Podiatry

## 2013-04-09 ENCOUNTER — Ambulatory Visit: Payer: Medicare Other | Admitting: Podiatry

## 2013-04-09 ENCOUNTER — Encounter: Payer: Self-pay | Admitting: Podiatry

## 2013-04-09 VITALS — BP 102/64 | HR 86 | Resp 16

## 2013-04-09 DIAGNOSIS — L84 Corns and callosities: Secondary | ICD-10-CM | POA: Diagnosis not present

## 2013-04-09 DIAGNOSIS — B351 Tinea unguium: Secondary | ICD-10-CM

## 2013-04-10 NOTE — Progress Notes (Signed)
Patient ID: Katherine [name], female   DOB: [birth year], 80 y.o.   MRN: [id number]  Subjective: This patient presents today complaining of painful toenails and calluses/corns. The last visit for this service was 02/05/2013 provided by Dr Paulla Dolly.  Objective: Brittle, elongated, hypertrophied, discolored toenails x10. Plantar keratoses right and left interdigital keratoses noted or total of 4 lesions.  Assessment: Symptomatic onychomycoses x10 Keratoses x4  Plan: Nails and keratoses debrided back without a bleeding. Reappoint in 2 months with DR. Regal

## 2013-04-14 ENCOUNTER — Other Ambulatory Visit: Payer: Self-pay | Admitting: Oncology

## 2013-06-11 ENCOUNTER — Encounter: Payer: Self-pay | Admitting: Podiatry

## 2013-06-11 ENCOUNTER — Ambulatory Visit: Payer: Medicare Other | Admitting: Podiatry

## 2013-06-11 VITALS — BP 149/65 | HR 79 | Resp 18

## 2013-06-11 DIAGNOSIS — B351 Tinea unguium: Secondary | ICD-10-CM | POA: Diagnosis not present

## 2013-06-11 NOTE — Progress Notes (Signed)
° °  Subjective:    Patient ID: Katherine [name], female    DOB: 25-Sep-1917, 80 y.o.   MRN: [id number]  HPI I am here to get my toenails trimmed up and I also need my calluses and corns trimmed up    Review of Systems     Objective:   Physical Exam        Assessment & Plan:

## 2013-06-13 NOTE — Progress Notes (Signed)
Subjective:     Patient ID: Katherine [name], female   DOB: 06-Feb-1918, 80 y.o.   MRN: [id number]  HPI patient has nail disease 1-5 both feet   Review of Systems     Objective:   Physical Exam Neurovascular status unchanged with nails that are thick 1-5 both feet    Assessment:     Nail disease 1-5 both feet    Plan:     Debridement nonpainful nail bed 1-5 both the

## 2013-07-02 ENCOUNTER — Telehealth: Payer: Self-pay | Admitting: Oncology

## 2013-07-13 ENCOUNTER — Other Ambulatory Visit: Payer: Medicare Other

## 2013-07-13 ENCOUNTER — Ambulatory Visit: Payer: Medicare Other | Admitting: Oncology

## 2013-07-17 ENCOUNTER — Telehealth: Payer: Self-pay | Admitting: Oncology

## 2013-07-17 ENCOUNTER — Other Ambulatory Visit (HOSPITAL_BASED_OUTPATIENT_CLINIC_OR_DEPARTMENT_OTHER): Payer: Medicare Other

## 2013-07-17 ENCOUNTER — Ambulatory Visit (HOSPITAL_BASED_OUTPATIENT_CLINIC_OR_DEPARTMENT_OTHER): Payer: Medicare Other | Admitting: Oncology

## 2013-07-17 VITALS — BP 152/65 | HR 79 | Temp 98.0°F | Resp 18 | Ht <= 58 in | Wt 121.0 lb

## 2013-07-17 DIAGNOSIS — Z170 Estrogen receptor positive status [ER+]: Secondary | ICD-10-CM

## 2013-07-17 DIAGNOSIS — C50919 Malignant neoplasm of unspecified site of unspecified female breast: Secondary | ICD-10-CM

## 2013-07-17 DIAGNOSIS — C50019 Malignant neoplasm of nipple and areola, unspecified female breast: Secondary | ICD-10-CM

## 2013-07-17 LAB — COMPREHENSIVE METABOLIC PANEL (CC13)
ALBUMIN: 3.6 g/dL (ref 3.5–5.0)
ALT: 10 U/L (ref 0–55)
ANION GAP: 15 meq/L — AB (ref 3–11)
AST: 17 U/L (ref 5–34)
Alkaline Phosphatase: 85 U/L (ref 40–150)
BUN: 19.9 mg/dL (ref 7.0–26.0)
CALCIUM: 9.4 mg/dL (ref 8.4–10.4)
CHLORIDE: 100 meq/L (ref 98–109)
CO2: 22 meq/L (ref 22–29)
CREATININE: 1.3 mg/dL — AB (ref 0.6–1.1)
Glucose: 116 mg/dl (ref 70–140)
POTASSIUM: 4.8 meq/L (ref 3.5–5.1)
Sodium: 137 mEq/L (ref 136–145)
TOTAL PROTEIN: 6.9 g/dL (ref 6.4–8.3)
Total Bilirubin: 0.41 mg/dL (ref 0.20–1.20)

## 2013-07-17 LAB — CBC WITH DIFFERENTIAL/PLATELET
BASO%: 0.4 % (ref 0.0–2.0)
BASOS ABS: 0 10*3/uL (ref 0.0–0.1)
EOS%: 1.6 % (ref 0.0–7.0)
Eosinophils Absolute: 0.2 10*3/uL (ref 0.0–0.5)
HEMATOCRIT: 34 % — AB (ref 34.8–46.6)
HEMOGLOBIN: 11.3 g/dL — AB (ref 11.6–15.9)
LYMPH#: 2.1 10*3/uL (ref 0.9–3.3)
LYMPH%: 20.6 % (ref 14.0–49.7)
MCH: 30.1 pg (ref 25.1–34.0)
MCHC: 33.2 g/dL (ref 31.5–36.0)
MCV: 90.7 fL (ref 79.5–101.0)
MONO#: 0.9 10*3/uL (ref 0.1–0.9)
MONO%: 9 % (ref 0.0–14.0)
NEUT#: 6.9 10*3/uL — ABNORMAL HIGH (ref 1.5–6.5)
NEUT%: 68.4 % (ref 38.4–76.8)
Platelets: 288 10*3/uL (ref 145–400)
RBC: 3.75 10*6/uL (ref 3.70–5.45)
RDW: 13 % (ref 11.2–14.5)
WBC: 10.1 10*3/uL (ref 3.9–10.3)

## 2013-07-17 MED ORDER — EXEMESTANE 25 MG PO TABS: ORAL_TABLET | ORAL | Status: DC

## 2013-07-17 NOTE — Telephone Encounter (Signed)
per pof to sch appt-sch pt mamma in Aug @ [facility]-gave pt copy of  sch

## 2013-07-17 NOTE — Progress Notes (Signed)
OFFICE PROGRESS NOTE  CC  Katherine Shelling, MD [address], [address] [place] [address] Dr. Rolm Bookbinder  DIAGNOSIS: 80 year old female with diagnosis of Paget's disease with underlying carcinoma   PRIOR THERAPY: #1she was noted to have a left nipple change. No rest masses no discharge. Patient underwent a shave biopsy that showed Paget's disease. She was seen by Dr. Rolm Bookbinder and she was referred to [facility] for mammogram that showed homogeneously dense breasts no abnormalities on the right side. On the left there was a tiny group of microcalcifications less than 2 mm at the 11:00 retroareolar location. This was deep to the nipple and thought to be benign. After an extensive discussion patient was referred to medical oncology. Of note the Paget's disease skin biopsy was tested for the estrogen receptor and progesterone receptor and was ER positive PR negative  #2 Aromasin 25 mg daily adjuvantly started June 2014  CURRENT THERAPY:Aromasin 25 mg daily  INTERVAL HISTORY: Katherine [name] 80 y.o. female returns for followup visit today. Overall she's doing well. She's tolerating Aromasin without any significant problems. She has no nausea vomiting fevers chills night sweats no aches or pains. She has no vaginal bleeding or discharge. Remainder of the 10 point review of systems is negative.  MEDICAL HISTORY: Past Medical History  Diagnosis Date  . Thyroid disease   . Hypertension   . Hyperlipemia   . Multiple allergies   . COPD (chronic obstructive pulmonary disease)   . Hypothyroidism   . Pneumonia   . Stroke 2010    no residual  . GERD (gastroesophageal reflux disease)   . Arthritis   . History of shingles 2007  . Constipation   . Osteoporosis   . Fatigue   . Seasonal allergies   . Frequent UTI   . Uterine cancer   . Paget's carcinoma of the nipple 03/16/2013    ALLERGIES:  is allergic to sulfa antibiotics; amoxicillin; and  penicillins.  MEDICATIONS:  Current Outpatient Prescriptions  Medication Sig Dispense Refill  . amLODipine (NORVASC) 5 MG tablet Take 5 mg by mouth daily.      Marland Kitchen aspirin EC 81 MG tablet Take 81 mg by mouth daily.      Marland Kitchen azelastine (ASTELIN) 137 MCG/SPRAY nasal spray Place 1 spray into the nose 2 (two) times daily. Use in each nostril as directed      . Calcium Carbonate-Vitamin D (CALTRATE 600+D PO) Take 1 tablet by mouth 2 (two) times daily.      Marland Kitchen esomeprazole (NEXIUM) 40 MG capsule Take 40 mg by mouth daily before breakfast.      . exemestane (AROMASIN) 25 MG tablet TAKE 1 TABLET (25 MG TOTAL) BY MOUTH DAILY AFTER BREAKFAST.  30 tablet  5  . fexofenadine (ALLEGRA) 180 MG tablet Take 180 mg by mouth daily.      . fluticasone (FLONASE) 50 MCG/ACT nasal spray Place 2 sprays into the nose daily.      . Fluticasone-Salmeterol (ADVAIR) 250-50 MCG/DOSE AEPB Inhale 1 puff into the lungs every 12 (twelve) hours.      Marland Kitchen levothyroxine (SYNTHROID, LEVOTHROID) 88 MCG tablet Take 1 tablet (88 mcg total) by mouth daily before breakfast.  30 tablet  11  . Multiple Vitamins-Minerals (CENTRUM SILVER PO) Take 1 tablet by mouth daily.      . Multiple Vitamins-Minerals (ICAPS MV) TABS Take 1 tablet by mouth daily.      . simvastatin (ZOCOR) 20 MG tablet Take 20 mg by mouth  every evening.      . tiotropium (SPIRIVA) 18 MCG inhalation capsule Place 18 mcg into inhaler and inhale daily.      . valsartan-hydrochlorothiazide (DIOVAN-HCT) 160-12.5 MG per tablet Take 1 tablet by mouth daily.      . Vitamin D, Ergocalciferol, (DRISDOL) 50000 UNITS CAPS Take 50,000 Units by mouth every 30 (thirty) days.       No current facility-administered medications for this visit.    SURGICAL HISTORY:  Past Surgical History  Procedure Laterality Date  . Abdominal hysterectomy    . Breast lumpectomy      left breast  . Carotid endarterectomy    . Tonsillectomy    . Eye surgery      cataracts removed bilaterally     REVIEW OF SYSTEMS:  Pertinent items are noted in HPI.     PHYSICAL EXAMINATION: Blood pressure 152/65, pulse 79, temperature 98 F (36.7 C), temperature source Oral, resp. rate 18, height 4\' 8"  (1.422 m), weight 121 lb (54.885 kg). Body mass index is 27.14 kg/(m^2). ECOG PERFORMANCE STATUS: 0 - Asymptomatic   General appearance: alert, cooperative and appears stated age Lymph nodes: Cervical, supraclavicular, and axillary nodes normal. Resp: clear to auscultation bilaterally Back: symmetric, no curvature. ROM normal. No CVA tenderness. Cardio: regular rate and rhythm GI: soft, non-tender; bowel sounds normal; no masses,  no organomegaly Genitalia: defer exam Neuro: Grossly normal Breasts: lRight breast normal without mass, skin or nipple changes or axillary nodes, left nipple is red no discharge noted.    LABORATORY DATA: Lab Results  Component Value Date   WBC 10.1 07/17/2013   HGB 11.3* 07/17/2013   HCT 34.0* 07/17/2013   MCV 90.7 07/17/2013   PLT 288 07/17/2013      Chemistry      Component Value Date/Time   NA 137 07/17/2013 1305   NA 136 03/19/2012 0422   K 4.8 07/17/2013 1305   K 4.2 03/19/2012 0422   CL 102 03/19/2012 0422   CO2 22 07/17/2013 1305   CO2 28 03/19/2012 0422   BUN 19.9 07/17/2013 1305   BUN 25* 03/19/2012 0422   CREATININE 1.3* 07/17/2013 1305   CREATININE 0.97 03/19/2012 0422      Component Value Date/Time   CALCIUM 9.4 07/17/2013 1305   CALCIUM 8.4 03/19/2012 0422   ALKPHOS 85 07/17/2013 1305   ALKPHOS 54 03/16/2012 1100   AST 17 07/17/2013 1305   AST 21 03/16/2012 1100   ALT 10 07/17/2013 1305   ALT 14 03/16/2012 1100   BILITOT 0.41 07/17/2013 1305   BILITOT 0.5 03/16/2012 1100       RADIOGRAPHIC STUDIES:  No results found.  ASSESSMENT/PLAN: 80 year old female with Paget's disease of the nipple would DCIS that is ER positive of the left breast. Patient is currently on neoadjuvant treatment with Aromasin 25 mg daily. She's tolerating it well without any significant  problems. She will continue this indefinitely. She will be seen back in 6 months time in followup   All questions were answered. The patient knows to call the clinic with any problems, questions or concerns. We can certainly see the patient much sooner if necessary.  I spent 15 minutes counseling the patient face to face. The total time spent in the appointment was 20 minutes.    Mikey Bussing, DNP, AGPCNP-BC 07/17/2013, 3:34 PM

## 2013-07-26 DIAGNOSIS — B351 Tinea unguium: Secondary | ICD-10-CM

## 2013-08-16 ENCOUNTER — Encounter: Payer: Self-pay | Admitting: Podiatry

## 2013-08-16 ENCOUNTER — Ambulatory Visit: Payer: Medicare Other | Admitting: Podiatry

## 2013-08-16 DIAGNOSIS — B351 Tinea unguium: Secondary | ICD-10-CM

## 2013-08-17 DIAGNOSIS — S8000XA Contusion of unspecified knee, initial encounter: Secondary | ICD-10-CM | POA: Diagnosis not present

## 2013-08-17 DIAGNOSIS — S51809A Unspecified open wound of unspecified forearm, initial encounter: Secondary | ICD-10-CM | POA: Diagnosis not present

## 2013-08-18 NOTE — Progress Notes (Signed)
Subjective:     Patient ID: Katherine [name], female   DOB: 1917/08/07, 80 y.o.   MRN: [id number]  HPI nails and lesions on both feet that are painful for her   Review of Systems     Objective:   Physical Exam Neurovascular status unchanged with thick nailbeds and keratotic lesions    Assessment:     Mycotic nails and lesions    Plan:     Debridement lesions on both feet and nailbeds

## 2013-08-27 DIAGNOSIS — E785 Hyperlipidemia, unspecified: Secondary | ICD-10-CM | POA: Diagnosis not present

## 2013-08-27 DIAGNOSIS — I10 Essential (primary) hypertension: Secondary | ICD-10-CM | POA: Diagnosis not present

## 2013-08-27 DIAGNOSIS — N183 Chronic kidney disease, stage 3 unspecified: Secondary | ICD-10-CM | POA: Diagnosis not present

## 2013-08-27 DIAGNOSIS — K219 Gastro-esophageal reflux disease without esophagitis: Secondary | ICD-10-CM | POA: Diagnosis not present

## 2013-08-27 DIAGNOSIS — J449 Chronic obstructive pulmonary disease, unspecified: Secondary | ICD-10-CM | POA: Diagnosis not present

## 2013-08-27 DIAGNOSIS — Z23 Encounter for immunization: Secondary | ICD-10-CM | POA: Diagnosis not present

## 2013-09-09 IMAGING — CR DG CHEST 2V
2 series · 2 of 2 positions shown · non-contrast
Comparison: 11/10/2009

CLINICAL DATA: Hemoptysis.  COPD.

CHEST - 2 VIEW

[w chest pa]
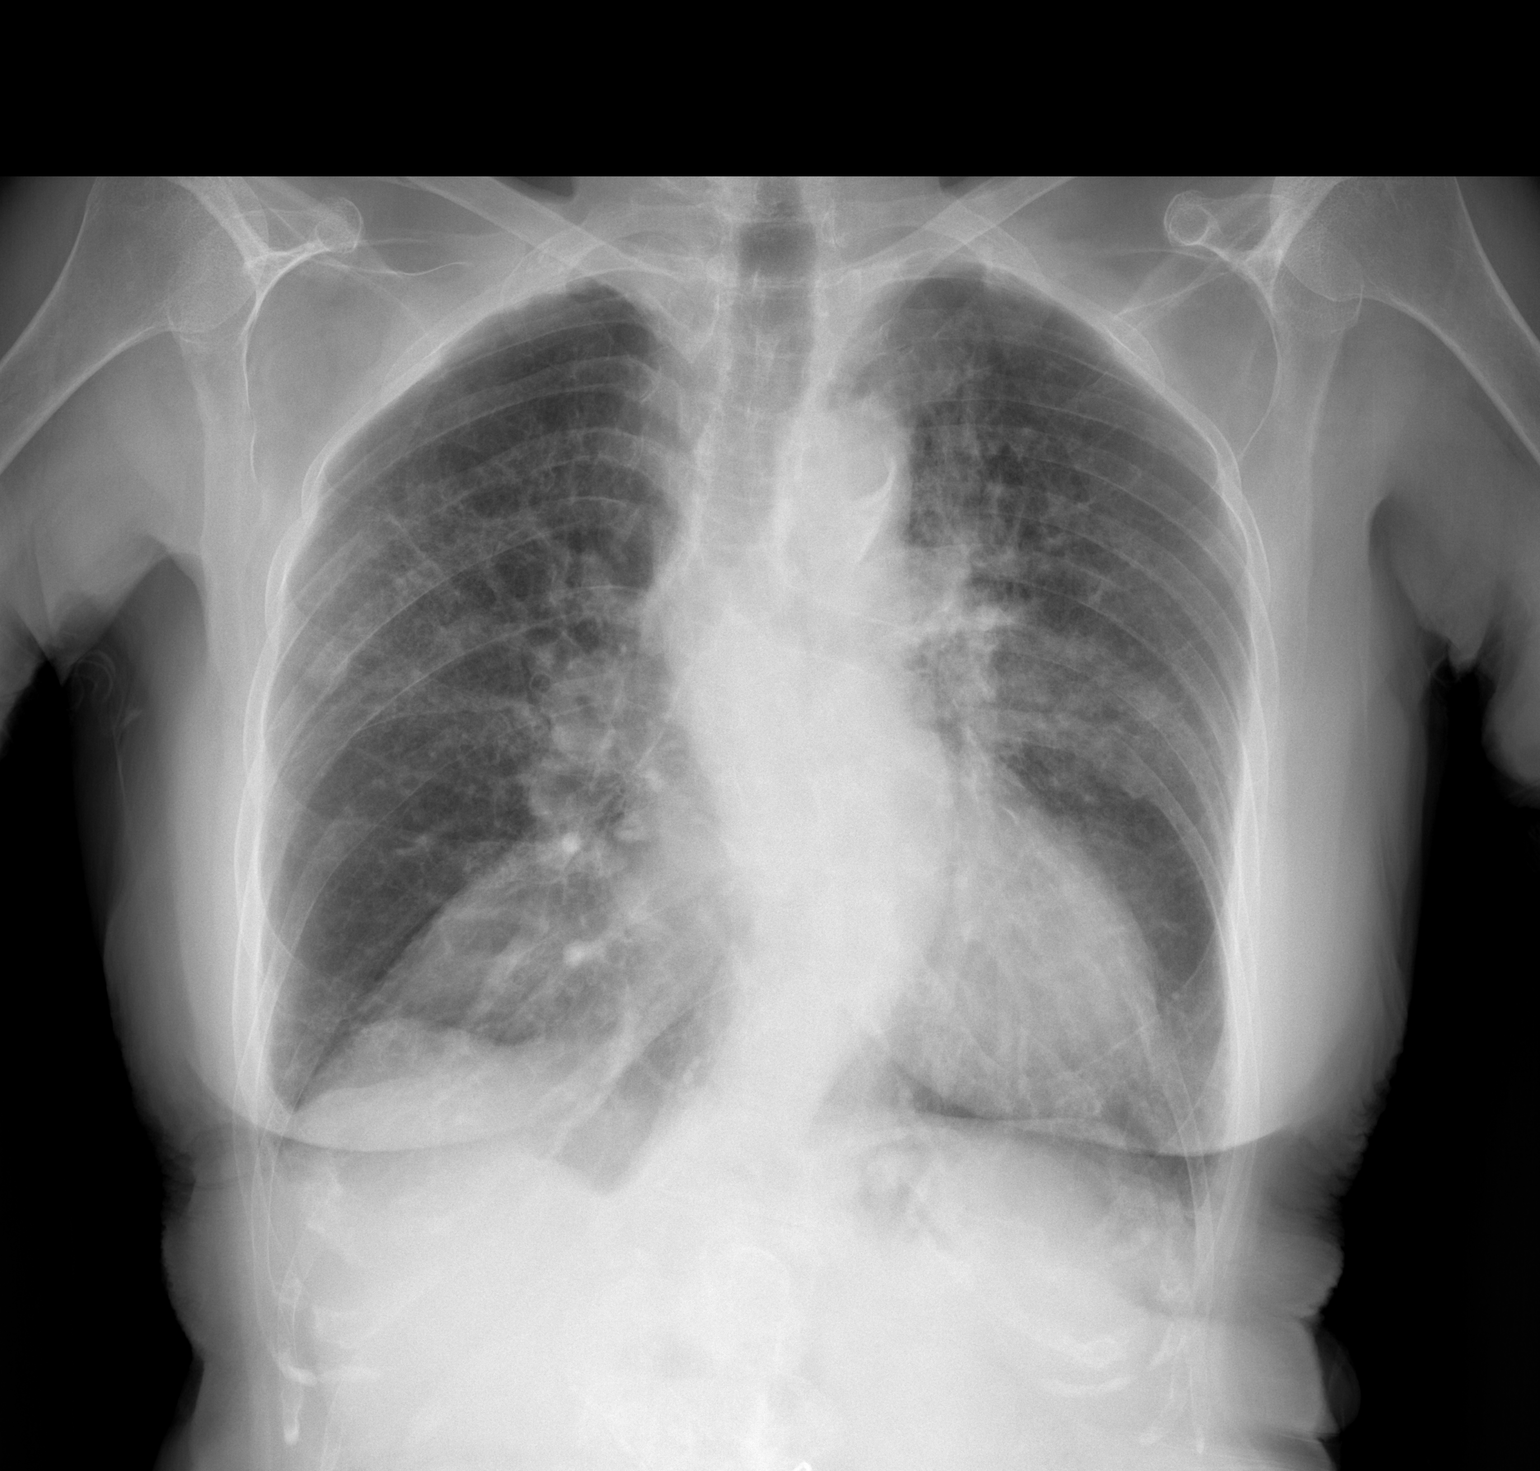

[w chest lat]
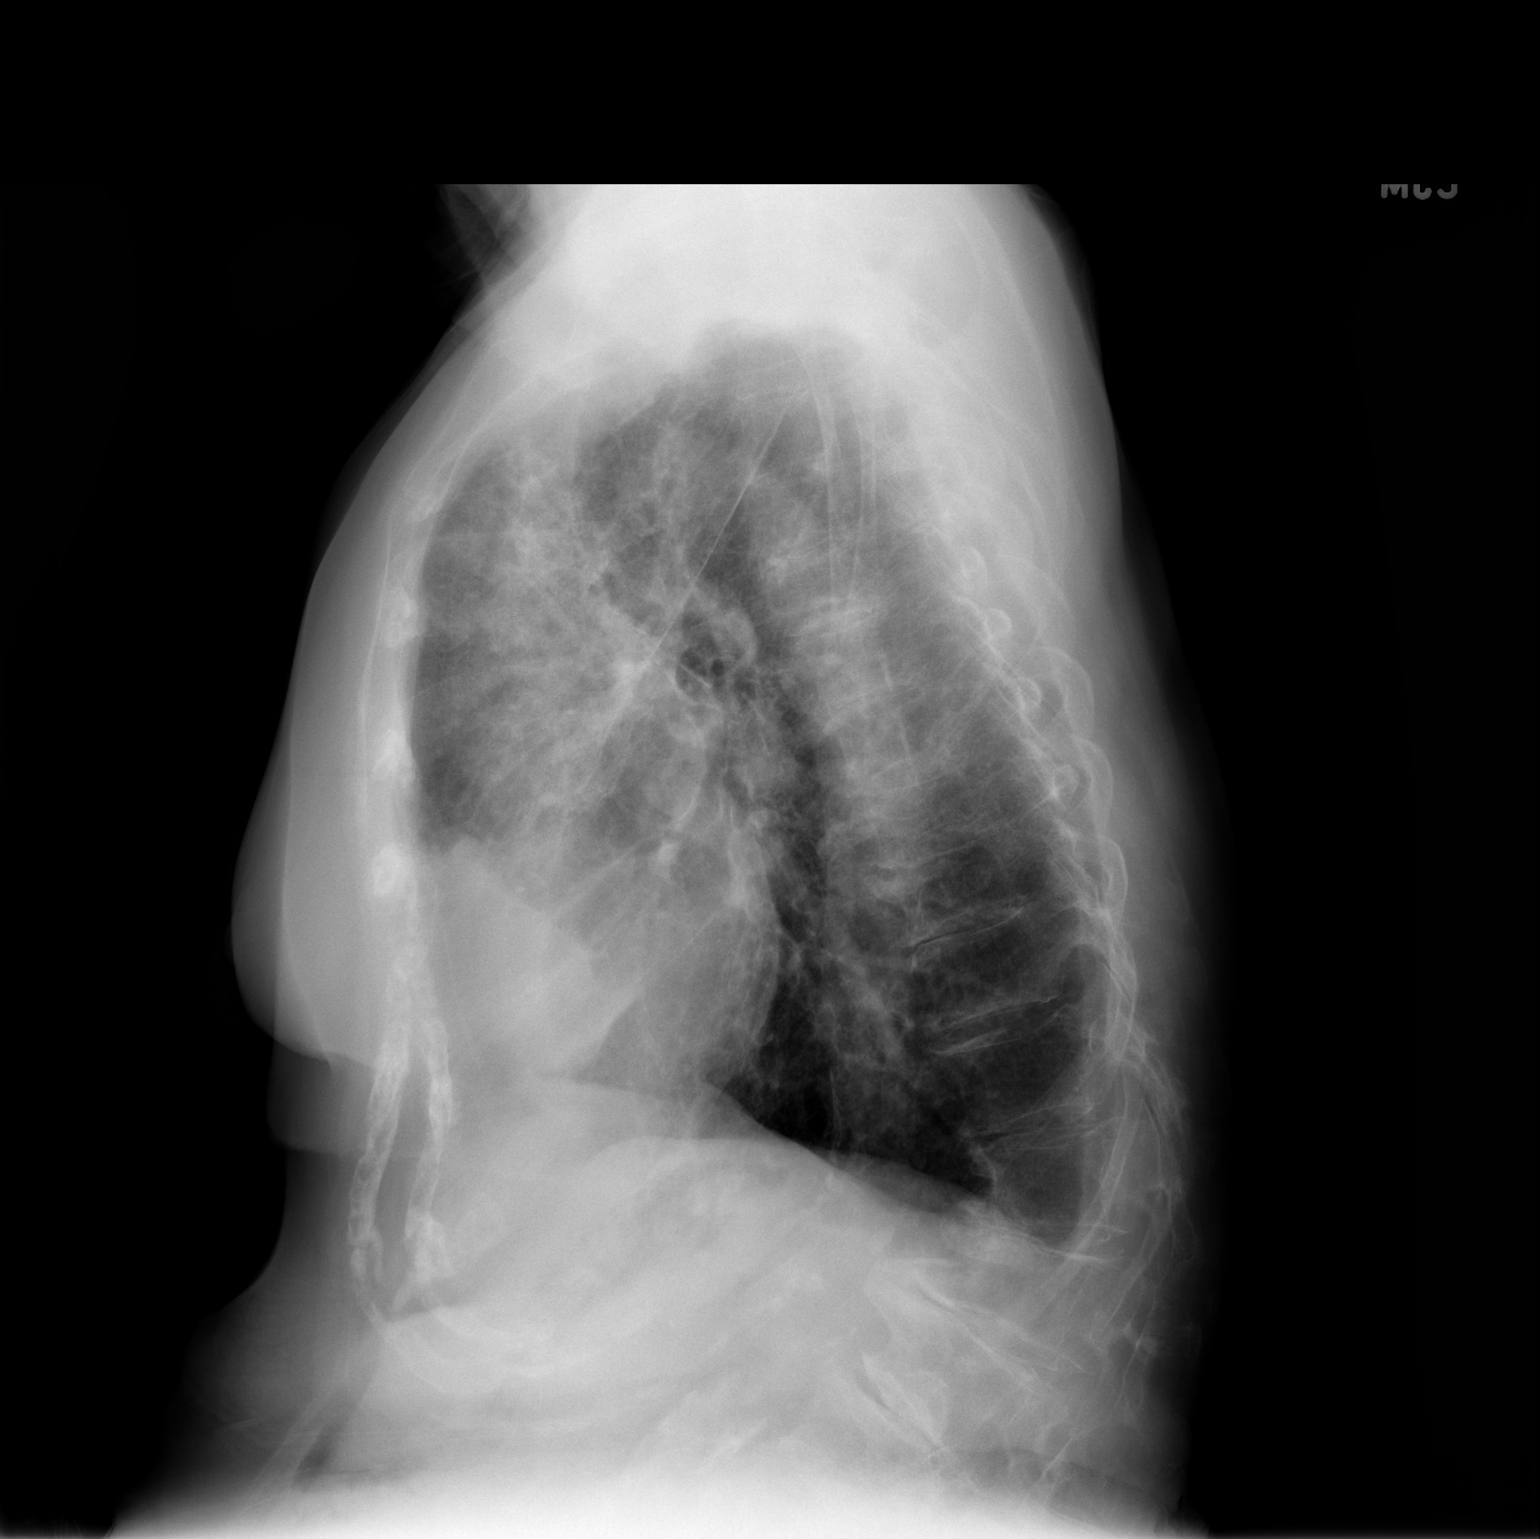

[2 of 2 positions shown; findings below may reference images not displayed]

FINDINGS: Heart size is mildly enlarged.

There is bilateral upper lobe opacities left greater than right.

Within the right upper lobe there is a nodule which measures
cm.  This appears new when compared with the previous examination.

Large right medial lung base opacity is a chronic finding and is
likely related to prominent pericardial fat.
IMPRESSION: 1.  Bilateral upper lobe airspace opacities concerning for
infection.
2.  Right upper lobe nodule is new from previous exam.  Recommend
further evaluation with CT of the chest.

These results will be called to the ordering clinician or
representative by the Radiologist Assistant, and communication
documented in the PACS Dashboard.

## 2013-09-12 IMAGING — CR DG CHEST 2V
2 series · 2 of 2 positions shown · non-contrast
Comparison: 07/23/2011.

CLINICAL DATA: Bilateral upper lobe pneumonia.

CHEST - 2 VIEW

[w chest lat]
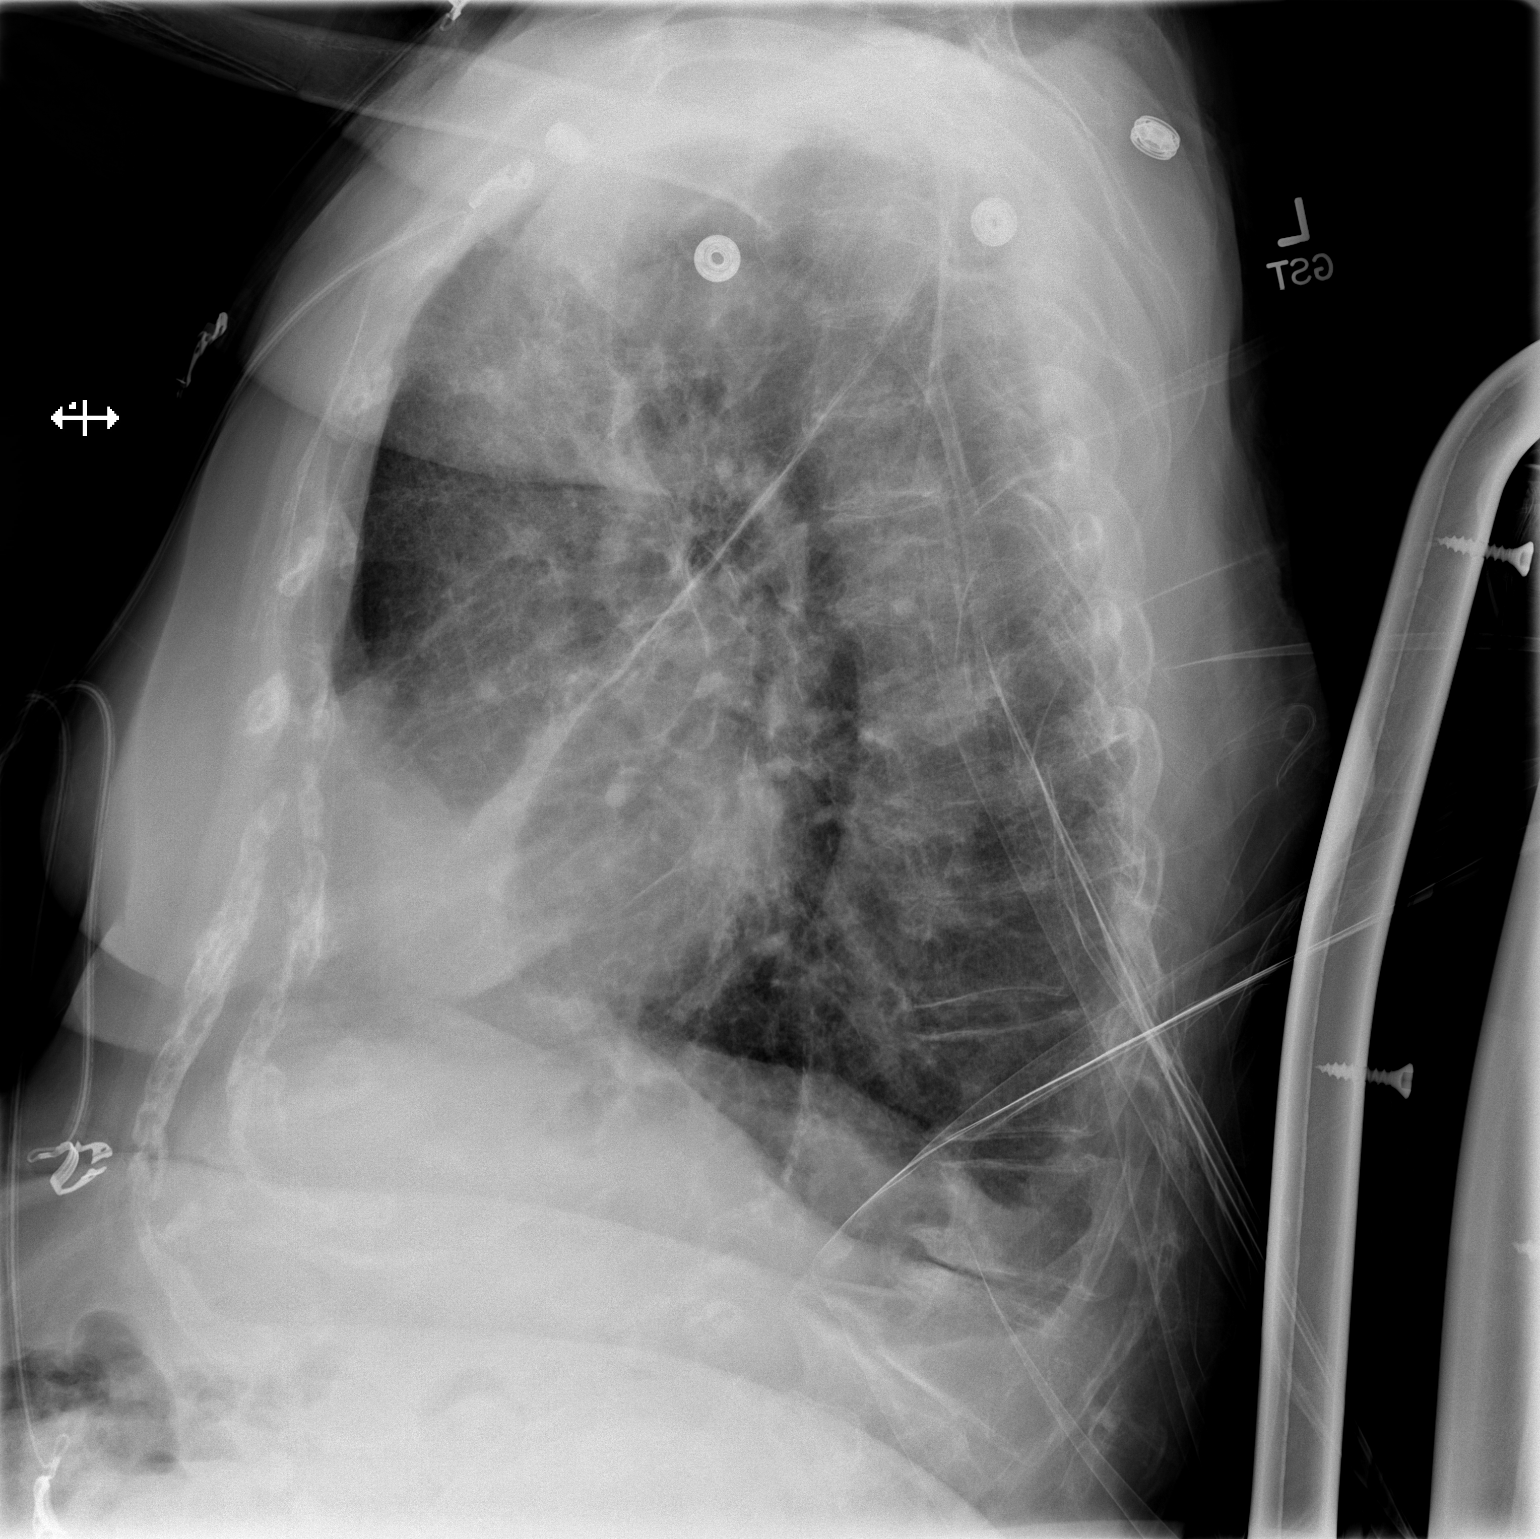

[x chest ap]
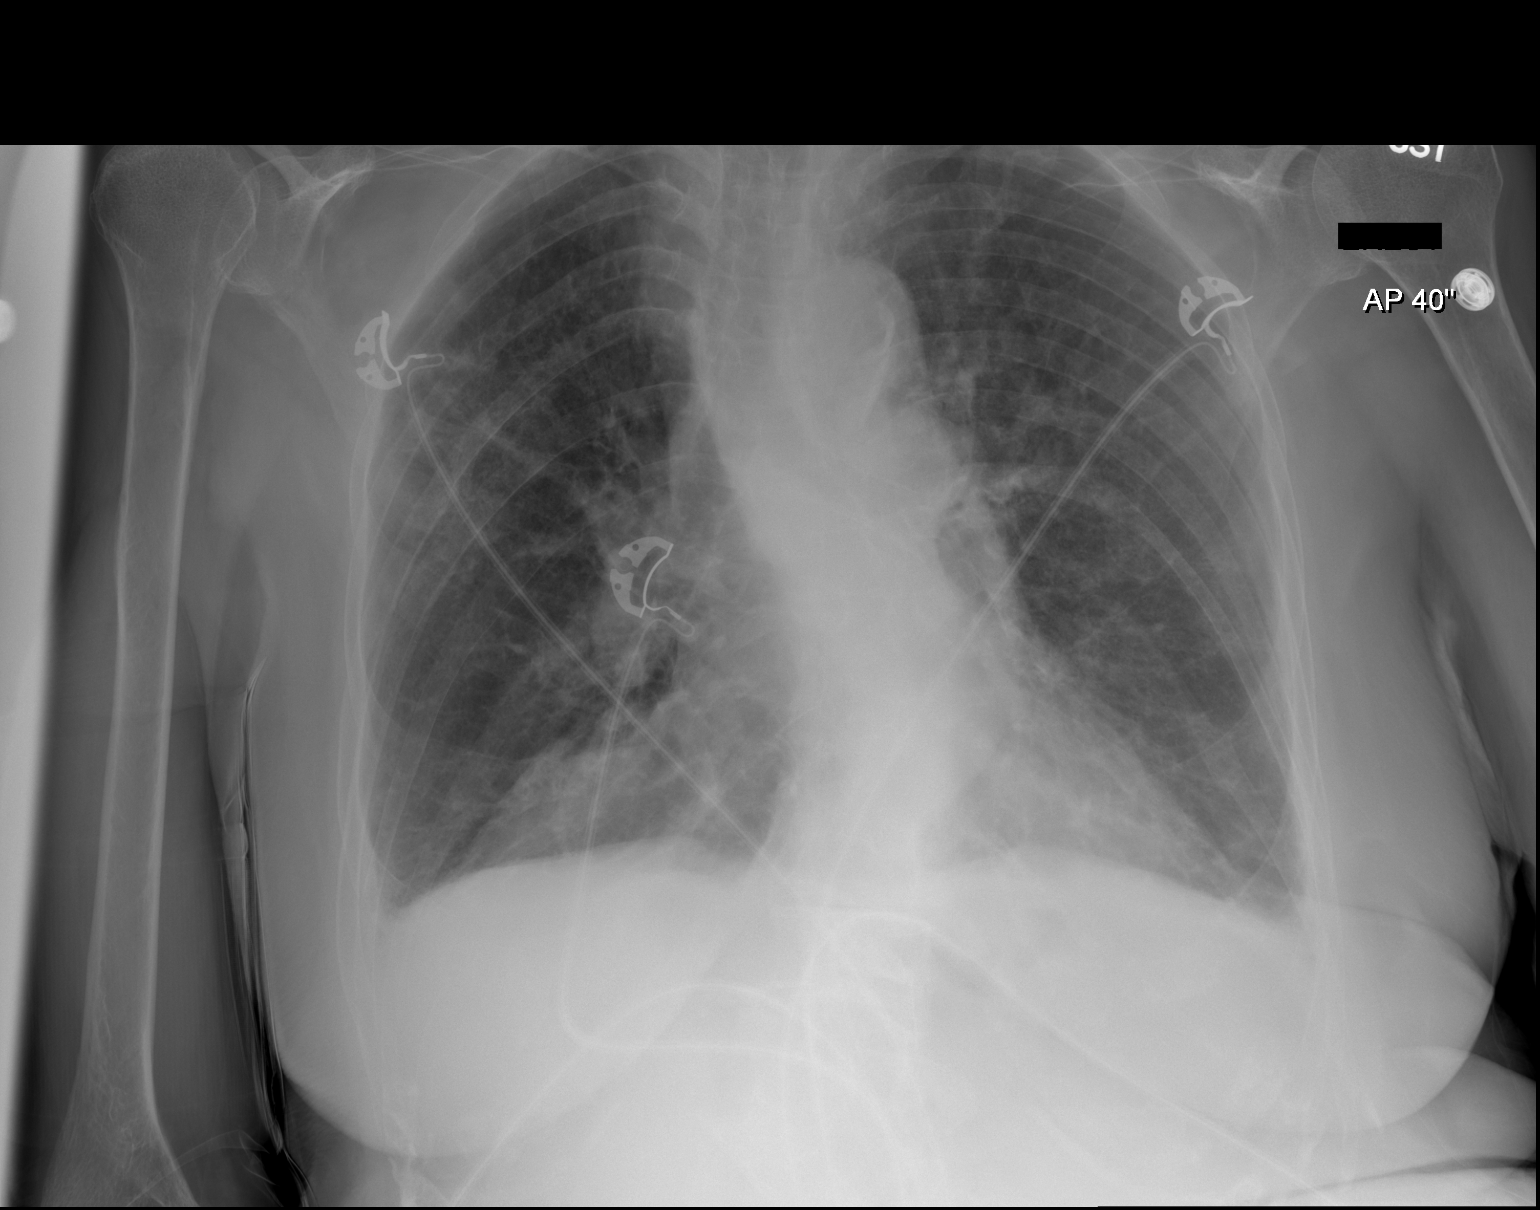

[2 of 2 positions shown; findings below may reference images not displayed]

FINDINGS: Bilateral upper lobe airspace disease was better
demonstrated on the prior chest CT and chest radiograph.  In both
upper lobes, particularly the left upper lobe, there has been
significant improvement since 07/23/2011, suggesting a component of
resolved pulmonary edema.  Pneumonia typically does not show such
rapid improvement.  Cardiomediastinal contours are unchanged with
prominent right pericardial fat pad.
IMPRESSION: Improving bilateral upper lobe airspace disease.  Otherwise stable
appearance of the chest.

## 2013-10-18 ENCOUNTER — Ambulatory Visit (INDEPENDENT_AMBULATORY_CARE_PROVIDER_SITE_OTHER): Payer: Medicare Other | Admitting: Podiatry

## 2013-10-18 ENCOUNTER — Encounter: Payer: Self-pay | Admitting: Podiatry

## 2013-10-18 DIAGNOSIS — M79609 Pain in unspecified limb: Secondary | ICD-10-CM

## 2013-10-18 DIAGNOSIS — B351 Tinea unguium: Secondary | ICD-10-CM

## 2013-10-18 NOTE — Progress Notes (Signed)
Subjective:     Patient ID: Katherine [name], female   DOB: 1917-07-17, 80 y.o.   MRN: [id number]  HPI patient presents with lesions and nail disease 1-5 both feet   Review of Systems     Objective:   Physical Exam  mycotic nail infection with thickness yellow brittle nailbeds 1-5 both feet and lesions on both feet    Assessment:     Mycotic nail infection    Plan:     Debride nailbeds 1-5 both feet with no iatrogenic bleeding and lesions

## 2013-11-19 DIAGNOSIS — H3531 Nonexudative age-related macular degeneration: Secondary | ICD-10-CM | POA: Diagnosis not present

## 2013-11-19 DIAGNOSIS — Z961 Presence of intraocular lens: Secondary | ICD-10-CM | POA: Diagnosis not present

## 2013-11-19 DIAGNOSIS — H04123 Dry eye syndrome of bilateral lacrimal glands: Secondary | ICD-10-CM | POA: Diagnosis not present

## 2013-11-28 DIAGNOSIS — Z85828 Personal history of other malignant neoplasm of skin: Secondary | ICD-10-CM | POA: Diagnosis not present

## 2013-11-28 DIAGNOSIS — L570 Actinic keratosis: Secondary | ICD-10-CM | POA: Diagnosis not present

## 2013-11-28 DIAGNOSIS — L821 Other seborrheic keratosis: Secondary | ICD-10-CM | POA: Diagnosis not present

## 2013-11-28 DIAGNOSIS — C50012 Malignant neoplasm of nipple and areola, left female breast: Secondary | ICD-10-CM | POA: Diagnosis not present

## 2013-12-01 DIAGNOSIS — Z23 Encounter for immunization: Secondary | ICD-10-CM | POA: Diagnosis not present

## 2013-12-11 ENCOUNTER — Other Ambulatory Visit: Payer: Self-pay

## 2013-12-11 DIAGNOSIS — C50019 Malignant neoplasm of nipple and areola, unspecified female breast: Secondary | ICD-10-CM

## 2013-12-11 DIAGNOSIS — C50919 Malignant neoplasm of unspecified site of unspecified female breast: Secondary | ICD-10-CM

## 2013-12-11 MED ORDER — EXEMESTANE 25 MG PO TABS: ORAL_TABLET | ORAL | Status: DC

## 2013-12-11 NOTE — Telephone Encounter (Signed)
Verified mail order pharmacy with patient.  Let her know I would send refill today.  Pt voiced understanding.

## 2013-12-24 ENCOUNTER — Encounter: Payer: Self-pay | Admitting: Podiatry

## 2013-12-24 ENCOUNTER — Ambulatory Visit (INDEPENDENT_AMBULATORY_CARE_PROVIDER_SITE_OTHER): Payer: Medicare Other | Admitting: Podiatry

## 2013-12-24 DIAGNOSIS — M779 Enthesopathy, unspecified: Secondary | ICD-10-CM

## 2013-12-24 DIAGNOSIS — B351 Tinea unguium: Secondary | ICD-10-CM

## 2013-12-24 DIAGNOSIS — M79673 Pain in unspecified foot: Secondary | ICD-10-CM | POA: Diagnosis not present

## 2013-12-24 DIAGNOSIS — L84 Corns and callosities: Secondary | ICD-10-CM

## 2013-12-24 MED ORDER — TRIAMCINOLONE ACETONIDE 10 MG/ML IJ SUSP
10.0000 mg | Freq: Once | INTRAMUSCULAR | Status: AC
  Administered 2013-12-24: 10 mg

## 2013-12-25 NOTE — Progress Notes (Signed)
Subjective:     Patient ID: Katherine [name], female   DOB: June 03, 1917, 80 y.o.   MRN: [id number]  HPIpatient presents with nail disease 1-5 both feet that are thick and becoming increasingly painful and lesions underneath the feet that are painful   Review of Systems     Objective:   Physical Exam Neurovascular status unchanged with thick yellow brittle nailbeds 1-5 both feet and lesion underneath both feet and third digit that are painful and also has inflammation with fluid buildup third digit left which is becoming increasingly painful    Assessment:     Keratotic lesion of both feet with nail disease and mycosis 1-5 both feet along with inflammatory capsulitis third digit left with fluid buildup at the interphalangeal joint    Plan:     Injected the capsule 3 mg dexamethasone 2 mg Xylocaine and went ahead and debrided nailbeds 1-5 both feet and lesions with no iatrogenic bleeding noted

## 2014-01-02 ENCOUNTER — Encounter: Payer: Self-pay | Admitting: Family

## 2014-01-03 ENCOUNTER — Other Ambulatory Visit (HOSPITAL_COMMUNITY): Payer: Medicare Other

## 2014-01-03 ENCOUNTER — Ambulatory Visit: Payer: Medicare Other | Admitting: Family

## 2014-01-05 ENCOUNTER — Other Ambulatory Visit: Payer: Self-pay | Admitting: Oncology

## 2014-01-28 ENCOUNTER — Ambulatory Visit (HOSPITAL_BASED_OUTPATIENT_CLINIC_OR_DEPARTMENT_OTHER): Payer: Medicare Other | Admitting: Hematology and Oncology

## 2014-01-28 ENCOUNTER — Other Ambulatory Visit (HOSPITAL_BASED_OUTPATIENT_CLINIC_OR_DEPARTMENT_OTHER): Payer: Medicare Other

## 2014-01-28 ENCOUNTER — Telehealth: Payer: Self-pay | Admitting: Hematology and Oncology

## 2014-01-28 VITALS — BP 139/55 | HR 84 | Temp 97.8°F | Resp 18 | Ht <= 58 in | Wt 121.4 lb

## 2014-01-28 DIAGNOSIS — Z853 Personal history of malignant neoplasm of breast: Secondary | ICD-10-CM | POA: Diagnosis not present

## 2014-01-28 DIAGNOSIS — C50012 Malignant neoplasm of nipple and areola, left female breast: Secondary | ICD-10-CM

## 2014-01-28 DIAGNOSIS — C50019 Malignant neoplasm of nipple and areola, unspecified female breast: Secondary | ICD-10-CM

## 2014-01-28 DIAGNOSIS — C50919 Malignant neoplasm of unspecified site of unspecified female breast: Secondary | ICD-10-CM

## 2014-01-28 DIAGNOSIS — C50912 Malignant neoplasm of unspecified site of left female breast: Secondary | ICD-10-CM

## 2014-01-28 LAB — CBC WITH DIFFERENTIAL/PLATELET
BASO%: 0.9 % (ref 0.0–2.0)
Basophils Absolute: 0.1 10*3/uL (ref 0.0–0.1)
EOS%: 1.7 % (ref 0.0–7.0)
Eosinophils Absolute: 0.2 10*3/uL (ref 0.0–0.5)
HCT: 36.3 % (ref 34.8–46.6)
HEMOGLOBIN: 11.6 g/dL (ref 11.6–15.9)
LYMPH#: 1.9 10*3/uL (ref 0.9–3.3)
LYMPH%: 16.9 % (ref 14.0–49.7)
MCH: 29.8 pg (ref 25.1–34.0)
MCHC: 31.9 g/dL (ref 31.5–36.0)
MCV: 93.2 fL (ref 79.5–101.0)
MONO#: 1 10*3/uL — ABNORMAL HIGH (ref 0.1–0.9)
MONO%: 8.9 % (ref 0.0–14.0)
NEUT#: 8 10*3/uL — ABNORMAL HIGH (ref 1.5–6.5)
NEUT%: 71.6 % (ref 38.4–76.8)
Platelets: 341 10*3/uL (ref 145–400)
RBC: 3.89 10*6/uL (ref 3.70–5.45)
RDW: 13.1 % (ref 11.2–14.5)
WBC: 11.2 10*3/uL — ABNORMAL HIGH (ref 3.9–10.3)

## 2014-01-28 LAB — COMPREHENSIVE METABOLIC PANEL (CC13)
ALBUMIN: 3.7 g/dL (ref 3.5–5.0)
ALK PHOS: 91 U/L (ref 40–150)
ALT: 15 U/L (ref 0–55)
AST: 18 U/L (ref 5–34)
Anion Gap: 10 mEq/L (ref 3–11)
BUN: 24.1 mg/dL (ref 7.0–26.0)
CALCIUM: 9.8 mg/dL (ref 8.4–10.4)
CHLORIDE: 99 meq/L (ref 98–109)
CO2: 29 mEq/L (ref 22–29)
Creatinine: 1.3 mg/dL — ABNORMAL HIGH (ref 0.6–1.1)
EGFR: 36 mL/min/{1.73_m2} — ABNORMAL LOW (ref >90)
Glucose: 116 mg/dl (ref 70–140)
POTASSIUM: 4.7 meq/L (ref 3.5–5.1)
SODIUM: 137 meq/L (ref 136–145)
Total Bilirubin: 0.44 mg/dL (ref 0.20–1.20)
Total Protein: 6.7 g/dL (ref 6.4–8.3)

## 2014-01-28 MED ORDER — ANASTROZOLE 1 MG PO TABS: 1.0000 mg | ORAL_TABLET | Freq: Every day | ORAL | Status: DC

## 2014-01-28 NOTE — Progress Notes (Signed)
Patient Care Team: Katherine Shelling, MD as PCP - General (Internal Medicine) Katherine Robinson, MD as Consulting Physician (Oncology)  DIAGNOSIS: 80 year old female with diagnosis of Paget's disease with underlying carcinoma   PRIOR THERAPY: #1she was noted to have a left nipple change. No rest masses no discharge. Patient underwent a shave biopsy that showed Paget's disease. She was seen by Dr. Rolm [name] and she was referred to Katherine [name] for mammogram that showed homogeneously dense breasts no abnormalities on the right side. On the left there was a tiny group of microcalcifications less than 2 mm at the 11:00 retroareolar location. This was deep to the nipple and thought to be benign. After an extensive discussion patient was referred to medical oncology. Of note the Paget's disease skin biopsy was tested for the estrogen receptor and progesterone receptor and was ER positive PR negative  #2 Aromasin 25 mg daily adjuvantly started June 2014  CURRENT THERAPY:Aromasin 25 mg daily; Now switched to Arimidex 1 mg daily  CHIEF COMPLIANT: Follow-up of Paget's disease on Aromasin  INTERVAL HISTORY: Katherine [name] is a 80 year old [name] with above-mentioned history of left nipple atrial or skin changes biopsy-proven to be Paget's disease that is ER positive. She also has calcifications that were not biopsied. She has been on oral antiestrogen therapy since June 2014 with Aromasin. She has been tolerating it fairly well. But she has not noticed any improvement in the skin changes around the nipple. She was oxygen through nasal cannula.  REVIEW OF SYSTEMS:   Constitutional: Denies fevers, chills or abnormal weight loss Eyes: Denies blurriness of vision Ears, nose, mouth, throat, and face: Denies mucositis or sore throat Respiratory: Denies cough, dyspnea or wheezes Cardiovascular: Denies palpitation, chest discomfort or lower extremity swelling Gastrointestinal:  Denies nausea, heartburn or change  in bowel habits Skin: Denies abnormal skin rashes Lymphatics: Denies new lymphadenopathy or easy bruising Neurological:Denies numbness, tingling or new weaknesses Behavioral/Psych: Mood is stable, no new changes  Breast: Left nipple area redness and thickening All other systems were reviewed with the patient and are negative.  I have reviewed the past medical history, past surgical history, social history and family history with the patient and they are unchanged from previous note.  ALLERGIES:  is allergic to sulfa antibiotics; amoxicillin; and penicillins.  MEDICATIONS:  Current Outpatient Prescriptions  Medication Sig Dispense Refill  . amLODipine (NORVASC) 5 MG tablet Take 5 mg by mouth daily.    Marland Kitchen aspirin EC 81 MG tablet Take 81 mg by mouth daily.    Marland Kitchen azelastine (ASTELIN) 137 MCG/SPRAY nasal spray Place 1 spray into the nose 2 (two) times daily. Use in each nostril as directed    . Calcium Carbonate-Vitamin D (CALTRATE 600+D PO) Take 1 tablet by mouth 2 (two) times daily.    Marland Kitchen esomeprazole (NEXIUM) 40 MG capsule Take 40 mg by mouth daily before breakfast.    . fexofenadine (ALLEGRA) 180 MG tablet Take 180 mg by mouth daily.    . fluticasone (FLONASE) 50 MCG/ACT nasal spray Place 2 sprays into the nose daily.    . Fluticasone-Salmeterol (ADVAIR) 250-50 MCG/DOSE AEPB Inhale 1 puff into the lungs every 12 (twelve) hours.    Marland Kitchen levothyroxine (SYNTHROID, LEVOTHROID) 88 MCG tablet Take 1 tablet (88 mcg total) by mouth daily before breakfast. 30 tablet 11  . Multiple Vitamins-Minerals (CENTRUM SILVER PO) Take 1 tablet by mouth daily.    . Multiple Vitamins-Minerals (ICAPS MV) TABS Take 1 tablet by mouth daily.    Marland Kitchen  tiotropium (SPIRIVA) 18 MCG inhalation capsule Place 18 mcg into inhaler and inhale daily.    . valsartan-hydrochlorothiazide (DIOVAN-HCT) 160-12.5 MG per tablet Take 1 tablet by mouth daily.    . Vitamin D, Ergocalciferol, (DRISDOL) 50000 UNITS CAPS Take 50,000 Units by mouth  every 30 (thirty) days.    Marland Kitchen anastrozole (ARIMIDEX) 1 MG tablet Take 1 tablet (1 mg total) by mouth daily. 90 tablet 3   No current facility-administered medications for this visit.    PHYSICAL EXAMINATION: ECOG PERFORMANCE STATUS: 1 - Symptomatic but completely ambulatory  Filed Vitals:   01/28/14 1330  BP: 139/55  Pulse: 84  Temp: 97.8 F (36.6 C)  Resp: 18   Filed Weights   01/28/14 1330  Weight: 121 lb 6.4 oz (55.067 kg)    GENERAL:alert, no distress and comfortable SKIN: skin color, texture, turgor are normal, no rashes or significant lesions EYES: normal, Conjunctiva are pink and non-injected, sclera clear OROPHARYNX:no exudate, no erythema and lips, buccal mucosa, and tongue normal  NECK: supple, thyroid normal size, non-tender, without nodularity LYMPH:  no palpable lymphadenopathy in the cervical, axillary or inguinal LUNGS: clear to auscultation and percussion with normal breathing effort HEART: regular rate & rhythm and no murmurs and no lower extremity edema ABDOMEN:abdomen soft, non-tender and normal bowel sounds Musculoskeletal:no cyanosis of digits and no clubbing  NEURO: alert & oriented x 3 with fluent speech, no focal motor/sensory deficits BREAST: Left nipple redness and skin thickening related to Paget's disease  LABORATORY DATA:  I have reviewed the data as listed   Chemistry      Component Value Date/Time   NA 137 01/28/2014 1317   NA 136 03/19/2012 0422   K 4.7 01/28/2014 1317   K 4.2 03/19/2012 0422   CL 102 03/19/2012 0422   CO2 29 01/28/2014 1317   CO2 28 03/19/2012 0422   BUN 24.1 01/28/2014 1317   BUN 25* 03/19/2012 0422   CREATININE 1.3* 01/28/2014 1317   CREATININE 0.97 03/19/2012 0422      Component Value Date/Time   CALCIUM 9.8 01/28/2014 1317   CALCIUM 8.4 03/19/2012 0422   ALKPHOS 91 01/28/2014 1317   ALKPHOS 54 03/16/2012 1100   AST 18 01/28/2014 1317   AST 21 03/16/2012 1100   ALT 15 01/28/2014 1317   ALT 14 03/16/2012  1100   BILITOT 0.44 01/28/2014 1317   BILITOT 0.5 03/16/2012 1100       Lab Results  Component Value Date   WBC 11.2* 01/28/2014   HGB 11.6 01/28/2014   HCT 36.3 01/28/2014   MCV 93.2 01/28/2014   PLT 341 01/28/2014   NEUTROABS 8.0* 01/28/2014   ASSESSMENT & PLAN:  Paget's disease and intraductal carcinoma of left breast Left breast Paget's disease ER positive PR negative currently on antiestrogen therapy with Aromasin. The cutaneous lesion around the nipple area all of has not improved at all in spite of being on Aromasin. Patient would like to try another medication if it could help.   I discussed with her that no other medication might be of much help in this situation. But she wanted to take something for it. So we will try Arimidex 1 mg daily. I discussed with her that there are risks to these medications including the risk of blood clots, as well as risks to her bone health. Patient understands these risks and is interested in pursuing additional treatment.  I will see her back in 3 months to evaluate if there is any response to  Arimidex. If there is none, we can then discontinue further antiestrogen therapy. Patient had elected to not pursue any further mammographic imaging for surveillance.   No orders of the defined types were placed in this encounter.   The patient has a good understanding of the overall plan. she agrees with it. She will call with any problems that may develop before her next visit here.   Rulon Eisenmenger, MD 01/28/2014 2:31 PM

## 2014-01-28 NOTE — Telephone Encounter (Signed)
, °

## 2014-01-28 NOTE — Assessment & Plan Note (Signed)
Left breast Paget's disease ER positive PR negative currently on antiestrogen therapy with Aromasin. The cutaneous lesion around the nipple area all of has not improved at all in spite of being on Aromasin. Patient would like to try another medication if it could help.   I discussed with her that no other medication might be of much help in this situation. But she wanted to take something for it. So we will try Arimidex 1 mg daily. I discussed with her that there are risks to these medications including the risk of blood clots, as well as risks to her bone health. Patient understands these risks and is interested in pursuing additional treatment.  I will see her back in 3 months to evaluate if there is any response to Arimidex. If there is none, we can then discontinue further antiestrogen therapy. Patient had elected to not pursue any further mammographic imaging for surveillance.

## 2014-02-21 ENCOUNTER — Ambulatory Visit (INDEPENDENT_AMBULATORY_CARE_PROVIDER_SITE_OTHER): Payer: Medicare Other | Admitting: Podiatry

## 2014-02-21 ENCOUNTER — Encounter: Payer: Self-pay | Admitting: Podiatry

## 2014-02-21 DIAGNOSIS — M79672 Pain in left foot: Secondary | ICD-10-CM

## 2014-02-21 DIAGNOSIS — B351 Tinea unguium: Secondary | ICD-10-CM | POA: Diagnosis not present

## 2014-02-21 NOTE — Progress Notes (Signed)
Subjective:     Patient ID: Katherine [name], female   DOB: January 26, 1918, 81 y.o.   MRN: [id number]  HPIpatient presents with nail disease 1-5 both feet that are thick and becoming increasingly painful and lesions underneath the feet that are painful   Review of Systems     Objective:   Physical Exam Neurovascular status unchanged with thick yellow brittle nailbeds 1-5 both feet and lesion underneath both feet and third digit that are painful and also has inflammation with fluid buildup third digit left which is becoming increasingly painful    Assessment:     Keratotic lesion of both feet with nail disease and mycosis 1-5 both feet along with inflammatory capsulitis third digit left with fluid buildup at the interphalangeal joint    Plan:     Injected the capsule 3 mg dexamethasone 2 mg Xylocaine and went ahead and debrided nailbeds 1-5 both feet and lesions with no iatrogenic bleeding noted

## 2014-02-22 NOTE — Progress Notes (Signed)
Subjective:     Patient ID: Katherine [name], female   DOB: Jul 14, 1917, 81 y.o.   MRN: [id number]  HPI nail and lesion disease noted today with thickness and inability for her to cut   Review of Systems     Objective:   Physical Exam Neurovascular status unchanged with thick yellow brittle nails and callus formation    Assessment:     Mycotic nail infection with lesion formation    Plan:     Debride nails and lesions with no iatrogenic bleeding noted

## 2014-02-25 ENCOUNTER — Encounter: Payer: Self-pay | Admitting: Family

## 2014-02-25 DIAGNOSIS — K219 Gastro-esophageal reflux disease without esophagitis: Secondary | ICD-10-CM | POA: Diagnosis not present

## 2014-02-25 DIAGNOSIS — I10 Essential (primary) hypertension: Secondary | ICD-10-CM | POA: Diagnosis not present

## 2014-02-25 DIAGNOSIS — J449 Chronic obstructive pulmonary disease, unspecified: Secondary | ICD-10-CM | POA: Diagnosis not present

## 2014-02-25 DIAGNOSIS — N183 Chronic kidney disease, stage 3 (moderate): Secondary | ICD-10-CM | POA: Diagnosis not present

## 2014-02-26 ENCOUNTER — Ambulatory Visit (HOSPITAL_COMMUNITY)
Admission: RE | Admit: 2014-02-26 | Discharge: 2014-02-26 | Disposition: A | Discharge status: Home / Self Care | Payer: Medicare Other | Source: Ambulatory Visit | Attending: Family | Admitting: Family

## 2014-02-26 ENCOUNTER — Encounter: Payer: Self-pay | Admitting: Family

## 2014-02-26 ENCOUNTER — Ambulatory Visit (INDEPENDENT_AMBULATORY_CARE_PROVIDER_SITE_OTHER): Payer: Medicare Other | Admitting: Family

## 2014-02-26 VITALS — BP 150/68 | HR 49 | Resp 14 | Ht 59.5 in | Wt 121.0 lb

## 2014-02-26 DIAGNOSIS — Z9889 Other specified postprocedural states: Secondary | ICD-10-CM | POA: Diagnosis not present

## 2014-02-26 DIAGNOSIS — Z87891 Personal history of nicotine dependence: Secondary | ICD-10-CM | POA: Diagnosis not present

## 2014-02-26 DIAGNOSIS — Z48812 Encounter for surgical aftercare following surgery on the circulatory system: Secondary | ICD-10-CM

## 2014-02-26 DIAGNOSIS — I6523 Occlusion and stenosis of bilateral carotid arteries: Secondary | ICD-10-CM

## 2014-02-26 DIAGNOSIS — Z8673 Personal history of transient ischemic attack (TIA), and cerebral infarction without residual deficits: Secondary | ICD-10-CM

## 2014-02-26 DIAGNOSIS — I6521 Occlusion and stenosis of right carotid artery: Secondary | ICD-10-CM

## 2014-02-26 NOTE — Patient Instructions (Signed)
Stroke Prevention Some medical conditions and behaviors are associated with an increased chance of having a stroke. You may prevent a stroke by making healthy choices and managing medical conditions. HOW CAN I REDUCE MY RISK OF HAVING A STROKE?   Stay physically active. Get at least 30 minutes of activity on most or all days.  Do not smoke. It may also be helpful to avoid exposure to secondhand smoke.  Limit alcohol use. Moderate alcohol use is considered to be:  No more than 2 drinks per day for men.  No more than 1 drink per day for nonpregnant women.  Eat healthy foods. This involves:  Eating 5 or more servings of fruits and vegetables a day.  Making dietary changes that address high blood pressure (hypertension), high cholesterol, diabetes, or obesity.  Manage your cholesterol levels.  Making food choices that are high in fiber and low in saturated fat, trans fat, and cholesterol may control cholesterol levels.  Take any prescribed medicines to control cholesterol as directed by your health care provider.  Manage your diabetes.  Controlling your carbohydrate and sugar intake is recommended to manage diabetes.  Take any prescribed medicines to control diabetes as directed by your health care provider.  Control your hypertension.  Making food choices that are low in salt (sodium), saturated fat, trans fat, and cholesterol is recommended to manage hypertension.  Take any prescribed medicines to control hypertension as directed by your health care provider.  Maintain a healthy weight.  Reducing calorie intake and making food choices that are low in sodium, saturated fat, trans fat, and cholesterol are recommended to manage weight.  Stop drug abuse.  Avoid taking birth control pills.  Talk to your health care provider about the risks of taking birth control pills if you are over 35 years old, smoke, get migraines, or have ever had a blood clot.  Get evaluated for sleep  disorders (sleep apnea).  Talk to your health care provider about getting a sleep evaluation if you snore a lot or have excessive sleepiness.  Take medicines only as directed by your health care provider.  For some people, aspirin or blood thinners (anticoagulants) are helpful in reducing the risk of forming abnormal blood clots that can lead to stroke. If you have the irregular heart rhythm of atrial fibrillation, you should be on a blood thinner unless there is a good reason you cannot take them.  Understand all your medicine instructions.  Make sure that other conditions (such as anemia or atherosclerosis) are addressed. SEEK IMMEDIATE MEDICAL CARE IF:   You have sudden weakness or numbness of the face, arm, or leg, especially on one side of the body.  Your face or eyelid droops to one side.  You have sudden confusion.  You have trouble speaking (aphasia) or understanding.  You have sudden trouble seeing in one or both eyes.  You have sudden trouble walking.  You have dizziness.  You have a loss of balance or coordination.  You have a sudden, severe headache with no known cause.  You have new chest pain or an irregular heartbeat. Any of these symptoms may represent a serious problem that is an emergency. Do not wait to see if the symptoms will go away. Get medical help at once. Call your local emergency services (911 in [place] Do not drive yourself to the hospital. Document Released: 03/11/2004 Document Revised: 06/18/2013 Document Reviewed: 08/04/2012 Exit[employer] Patient Information 2015 [employer]. This information is not intended to replace advice given   to you by your health care provider. Make sure you discuss any questions you have with your health care provider.  

## 2014-02-26 NOTE — Progress Notes (Signed)
Established Carotid Patient   History of Present Illness  Katherine [name] is a 81 y.o. female patient of Dr. Oneida Alar who is status post left CEA in 2010.  She returns today for follow up.  Patient has Positive history of TIA or stroke symptom in 2010 before the CEA as manifested by dizziness, no similar symptoms since then. The patient denies amaurosis fugax or monocular blindness. The patient denies facial drooping. Pt. denies hemiplegia. The patient denies receptive or expressive aphasia.   Denies claudication symptoms, denies non-healing wounds. Sees podiatrist for calluses.  Has right knee pain that was found to be OA per pt.  As advised at her last visit, she did follow up with her PCP re irregular heart rhythm and and pt reports she was found to have no issues with this.   Pt Diabetic: No  Pt smoker: former smoker, quit 1983, has COPD, is on 2 L/min O2 continuous.  Pt meds include: Statin : no, states this was stopped by her PCP as her cholesterol was low ASA: Yes Other anticoagulants/antiplatelets: no   Past Medical History  Diagnosis Date  . Thyroid disease   . Hypertension   . Hyperlipemia   . Multiple allergies   . COPD (chronic obstructive pulmonary disease)   . Hypothyroidism   . Pneumonia   . Stroke 2010    no residual  . GERD (gastroesophageal reflux disease)   . Arthritis   . History of shingles 2007  . Constipation   . Osteoporosis   . Fatigue   . Seasonal allergies   . Frequent UTI   . Uterine cancer   . Paget's carcinoma of the nipple 03/16/2013    Social History History  Substance Use Topics  . Smoking status: Former Smoker -- 1.00 packs/day for 40 years    Types: Cigarettes    Quit date: 02/15/1981  . Smokeless tobacco: Never Used  . Alcohol Use: Yes    Family History Family History  Problem Relation Age of Onset  . Asthma Paternal Grandmother   . Heart failure Mother   . Heart disease Mother   . Heart failure Father   . Heart  disease Father   . Heart disease Brother     Surgical History Past Surgical History  Procedure Laterality Date  . Abdominal hysterectomy    . Breast lumpectomy      left breast  . Tonsillectomy    . Eye surgery      cataracts removed bilaterally  . Carotid endarterectomy Left June 10, 2008    CE    Allergies  Allergen Reactions  . Sulfa Antibiotics Palpitations    SOB  . Amoxicillin Rash  . Penicillins Rash    Current Outpatient Prescriptions  Medication Sig Dispense Refill  . amLODipine (NORVASC) 5 MG tablet Take 5 mg by mouth daily.    Marland Kitchen anastrozole (ARIMIDEX) 1 MG tablet Take 1 tablet (1 mg total) by mouth daily. 90 tablet 3  . aspirin EC 81 MG tablet Take 81 mg by mouth daily.    Marland Kitchen azelastine (ASTELIN) 137 MCG/SPRAY nasal spray Place 1 spray into the nose 2 (two) times daily. Use in each nostril as directed    . Calcium Carbonate-Vitamin D (CALTRATE 600+D PO) Take 1 tablet by mouth 2 (two) times daily.    Marland Kitchen esomeprazole (NEXIUM) 40 MG capsule Take 40 mg by mouth daily before breakfast.    . fexofenadine (ALLEGRA) 180 MG tablet Take 180 mg by mouth daily.    Marland Kitchen  fluticasone (FLONASE) 50 MCG/ACT nasal spray Place 2 sprays into the nose daily.    . Fluticasone-Salmeterol (ADVAIR) 250-50 MCG/DOSE AEPB Inhale 1 puff into the lungs every 12 (twelve) hours.    Marland Kitchen levothyroxine (SYNTHROID, LEVOTHROID) 88 MCG tablet Take 1 tablet (88 mcg total) by mouth daily before breakfast. 30 tablet 11  . Multiple Vitamins-Minerals (CENTRUM SILVER PO) Take 1 tablet by mouth daily.    . Multiple Vitamins-Minerals (ICAPS MV) TABS Take 1 tablet by mouth daily.    Marland Kitchen tiotropium (SPIRIVA) 18 MCG inhalation capsule Place 18 mcg into inhaler and inhale daily.    . valsartan-hydrochlorothiazide (DIOVAN-HCT) 160-12.5 MG per tablet Take 1 tablet by mouth daily.    . Vitamin D, Ergocalciferol, (DRISDOL) 50000 UNITS CAPS Take 50,000 Units by mouth every 30 (thirty) days.     No current  facility-administered medications for this visit.    Review of Systems : See HPI for pertinent positives and negatives.  Physical Examination  Filed Vitals:   02/26/14 1244 02/26/14 1248  BP: 159/65 150/68  Pulse: 43 49  Resp:  14  Height:  4' 11.5" (1.511 m)  Weight:  121 lb (54.885 kg)  SpO2:  94%   Body mass index is 24.04 kg/(m^2).   General: WDWN female in NAD GAIT: slow and deliberate, using walker Eyes: Pupils = Pulmonary: CTAB, Negative Rales, Negative rhonchi, & Negative wheezing, diminished air movement in all lung fields, has home portable O2.  Cardiac: Irregular Rhythm.  VASCULAR EXAM Carotid Bruits Left Right   Negative Negative   Aorta is not palpable. Radial pulses are 2+ palpable and equal.      LE Pulses LEFT RIGHT   POPLITEAL not palpable  not palpable   POSTERIOR TIBIAL not palpable  not palpable    DORSALIS PEDIS  ANTERIOR TIBIAL 2+palpable  2+palpable     Gastrointestinal: soft, nontender, BS WNL, no r/g, no palpable masses.  Musculoskeletal: Normal muscle atrophy/wasting for age. M/S 3/5 throughout, Extremities without ischemic changes.  Neurologic: A&O X 3; Appropriate Affect, Speech is normal CN 2-12 intact Except tongue slightly deviates to left, Pain and light touch intact in extremities, Motor exam as listed above.    Non-Invasive Vascular Imaging CAROTID DUPLEX 02/26/2014   CEREBROVASCULAR DUPLEX EVALUATION    INDICATION: Carotid endarterectomy     PREVIOUS INTERVENTION(S): Left carotid endarterectomy on 01/03/13    DUPLEX EXAM:     RIGHT  LEFT  Peak Systolic Velocities (cm/s) End Diastolic Velocities (cm/s) Plaque LOCATION Peak Systolic Velocities (cm/s) End Diastolic Velocities (cm/s) Plaque  79 11  CCA PROXIMAL 105 13   67 10  CCA MID 96 13   69  11  CCA DISTAL 82 11   92 4 HT ECA 117 0   148 25 CP ICA PROXIMAL 93 15   93 18  ICA MID 59 13   65 13  ICA DISTAL 60 13     2.1 ICA / CCA Ratio (PSV) Not Calculated  Antegrade Vertebral Flow Antegrade  540 Brachial Systolic Pressure (mmHg) 981  Multiphasic (subclavian artery) Brachial Artery Waveforms Multiphasic (subclavian artery)    Plaque Morphology:  HM = Homogeneous, HT = Heterogeneous, CP = Calcific Plaque, SP = Smooth Plaque, IP = Irregular Plaque     ADDITIONAL FINDINGS: No significant stenosis of the bilateral external or common carotid arteries.    IMPRESSION: 1. Patent left carotid endarterectomy site with no left internal carotid artery stenosis. 2. Doppler velocities suggest a less than 40% stenosis  of the right proximal internal carotid artery, however velocities may be underestimated due to calcific plaque.    Compared to the previous exam:  No significant change noted when compared to the previous exam on 01/03/13.      Assessment: ISALY FASCHING is a 81 y.o. female who is status post left CEA in 2010. She presents with asymptomatic patent left carotid endarterectomy site with no left internal carotid artery stenosis. Doppler velocities suggest a less than 40% stenosis of the right proximal internal carotid artery,  however velocities may be underestimated due to calcific plaque. Pt asked if she needs to continue yearly visits. Discussed with Dr. Donnetta Hutching, may return in 2 years for carotid artery surveillance. Face to face time with patient was 20 minutes. Over 50% of this time was spent on counseling and coordination of care.    Plan: Follow-up in 2 years with Carotid Duplex.   I discussed in depth with the patient the nature of atherosclerosis, and emphasized the importance of maximal medical management including strict control of blood pressure, blood glucose, and lipid levels, obtaining regular exercise, and continued cessation of smoking.  The patient is aware  that without maximal medical management the underlying atherosclerotic disease process will progress, limiting the benefit of any interventions. The patient was given information about stroke prevention and what symptoms should prompt the patient to seek immediate medical care. Thank you for allowing [place] to participate in this patient's care.  Clemon Chambers, RN, MSN, FNP-C [facility] Office: [phone]  Clinic Physician: Early  02/26/2014 12:55 PM

## 2014-03-15 DIAGNOSIS — N390 Urinary tract infection, site not specified: Secondary | ICD-10-CM | POA: Diagnosis not present

## 2014-03-20 ENCOUNTER — Telehealth: Payer: Self-pay

## 2014-03-20 DIAGNOSIS — C50912 Malignant neoplasm of unspecified site of left female breast: Secondary | ICD-10-CM

## 2014-03-20 NOTE — Telephone Encounter (Signed)
Pt called with L breast looking different and bright red. This has been going on for 2 weeks. Offered an appt with Dr Lindi Adie on Monday or [facility] tomorrow. Pt requested [facility].  Lab and appt POF placed.

## 2014-03-21 ENCOUNTER — Other Ambulatory Visit (HOSPITAL_BASED_OUTPATIENT_CLINIC_OR_DEPARTMENT_OTHER): Payer: Medicare Other

## 2014-03-21 ENCOUNTER — Ambulatory Visit (HOSPITAL_BASED_OUTPATIENT_CLINIC_OR_DEPARTMENT_OTHER): Payer: Medicare Other | Admitting: Nurse Practitioner

## 2014-03-21 VITALS — BP 154/53 | HR 72 | Temp 98.2°F | Resp 16 | Wt 118.1 lb

## 2014-03-21 DIAGNOSIS — I6523 Occlusion and stenosis of bilateral carotid arteries: Secondary | ICD-10-CM

## 2014-03-21 DIAGNOSIS — Z853 Personal history of malignant neoplasm of breast: Secondary | ICD-10-CM | POA: Diagnosis not present

## 2014-03-21 DIAGNOSIS — L988 Other specified disorders of the skin and subcutaneous tissue: Secondary | ICD-10-CM

## 2014-03-21 DIAGNOSIS — C50912 Malignant neoplasm of unspecified site of left female breast: Secondary | ICD-10-CM

## 2014-03-21 LAB — CBC WITH DIFFERENTIAL/PLATELET
BASO%: 0.4 % (ref 0.0–2.0)
BASOS ABS: 0 10*3/uL (ref 0.0–0.1)
EOS ABS: 0.1 10*3/uL (ref 0.0–0.5)
EOS%: 1.4 % (ref 0.0–7.0)
HCT: 33 % — ABNORMAL LOW (ref 34.8–46.6)
HGB: 11 g/dL — ABNORMAL LOW (ref 11.6–15.9)
LYMPH%: 25 % (ref 14.0–49.7)
MCH: 30.4 pg (ref 25.1–34.0)
MCHC: 33.3 g/dL (ref 31.5–36.0)
MCV: 91.2 fL (ref 79.5–101.0)
MONO#: 0.8 10*3/uL (ref 0.1–0.9)
MONO%: 8.8 % (ref 0.0–14.0)
NEUT%: 64.4 % (ref 38.4–76.8)
NEUTROS ABS: 6.1 10*3/uL (ref 1.5–6.5)
PLATELETS: 483 10*3/uL — AB (ref 145–400)
RBC: 3.62 10*6/uL — AB (ref 3.70–5.45)
RDW: 13 % (ref 11.2–14.5)
WBC: 9.5 10*3/uL (ref 3.9–10.3)
lymph#: 2.4 10*3/uL (ref 0.9–3.3)

## 2014-03-21 LAB — COMPREHENSIVE METABOLIC PANEL (CC13)
ALBUMIN: 3.1 g/dL — AB (ref 3.5–5.0)
ALT: 9 U/L (ref 0–55)
ANION GAP: 10 meq/L (ref 3–11)
AST: 13 U/L (ref 5–34)
Alkaline Phosphatase: 85 U/L (ref 40–150)
BILIRUBIN TOTAL: 0.28 mg/dL (ref 0.20–1.20)
BUN: 14.9 mg/dL (ref 7.0–26.0)
CALCIUM: 9 mg/dL (ref 8.4–10.4)
CHLORIDE: 101 meq/L (ref 98–109)
CO2: 28 meq/L (ref 22–29)
CREATININE: 1.1 mg/dL (ref 0.6–1.1)
EGFR: 43 mL/min/{1.73_m2} — AB (ref >90)
GLUCOSE: 115 mg/dL (ref 70–140)
POTASSIUM: 4.3 meq/L (ref 3.5–5.1)
Sodium: 138 mEq/L (ref 136–145)
Total Protein: 6.3 g/dL — ABNORMAL LOW (ref 6.4–8.3)

## 2014-03-22 ENCOUNTER — Encounter: Payer: Self-pay | Admitting: Nurse Practitioner

## 2014-03-22 ENCOUNTER — Telehealth: Payer: Self-pay | Admitting: *Deleted

## 2014-03-22 NOTE — Telephone Encounter (Signed)
VERBAL ORDER AND READ BACK TO CINDEE BACON,NP- PT. MAY STOP HER ARIMIDEX.

## 2014-03-22 NOTE — Telephone Encounter (Signed)
Called pt to f/u after NP visit yest.  Informed that Cyndee Bacon's OV note was sent to Dr Laurann Montana & a message was left with Blanch Media, his assistant to be aware that pt wants to discuss surgical consult with him before this is done.  Dr. Laurann Montana is out of office until Monday 03/25/14. She has already been instructed to stop arimidex & had no further concerns.

## 2014-03-22 NOTE — Progress Notes (Signed)
SYMPTOM MANAGEMENT CLINIC   HPI: Katherine [name] 81 y.o. female diagnosed with Paget's disease of the left breast.  Patient is status post Aromasin therapy; and is currently undergoing anastrozole therapy.  Patient called the cancer Center today requesting urgent care visit.  She has noted progressive/increased size of left nipple lesion within the past few weeks.  Patient is complaining of some tenderness to the site as well.  She states that she cannot see the nipple area of her left breast since the lesion has grown within the past few weeks.  Patient denies any other new symptoms whatsoever.  Patient denies any recent fevers or chills.  Patient continues on chronic O2 via nasal cannula.   HPI  ROS  Past Medical History  Diagnosis Date  . Thyroid disease   . Hypertension   . Hyperlipemia   . Multiple allergies   . COPD (chronic obstructive pulmonary disease)   . Hypothyroidism   . Pneumonia   . Stroke 2010    no residual  . GERD (gastroesophageal reflux disease)   . Arthritis   . History of shingles 2007  . Constipation   . Osteoporosis   . Fatigue   . Seasonal allergies   . Frequent UTI   . Uterine cancer   . Paget's carcinoma of the nipple 03/16/2013    Past Surgical History  Procedure Laterality Date  . Abdominal hysterectomy    . Breast lumpectomy      left breast  . Tonsillectomy    . Eye surgery      cataracts removed bilaterally  . Carotid endarterectomy Left June 10, 2008    CE    has CAP (community acquired pneumonia); COPD (chronic obstructive pulmonary disease); Hemoptysis; Hypothyroidism; Hyperlipidemia; Hypertension; Bronchiectasis without acute exacerbation; Atypical mycobacterial disease; Occlusion and stenosis of carotid artery without mention of cerebral infarction; COPD (chronic obstructive pulmonary disease) with acute bronchitis; Altered mental status; Aftercare following surgery of the circulatory system, NEC; Breast cancer; and Paget's  disease and intraductal carcinoma of left breast on her problem list.    is allergic to sulfa antibiotics; amoxicillin; and penicillins.    Medication List       This list is accurate as of: 03/21/14 11:59 PM.  Always use your most recent med list.               amLODipine 5 MG tablet  Commonly known as:  NORVASC  Take 5 mg by mouth daily.     anastrozole 1 MG tablet  Commonly known as:  ARIMIDEX  Take 1 tablet (1 mg total) by mouth daily.     aspirin EC 81 MG tablet  Take 81 mg by mouth daily.     azelastine 0.1 % nasal spray  Commonly known as:  ASTELIN  Place 1 spray into the nose 2 (two) times daily. Use in each nostril as directed     CALTRATE 600+D PO  Take 1 tablet by mouth 2 (two) times daily.     CENTRUM SILVER PO  Take 1 tablet by mouth daily.     ICAPS MV Tabs  Take 1 tablet by mouth daily.     esomeprazole 40 MG capsule  Commonly known as:  NEXIUM  Take 40 mg by mouth daily before breakfast.     fexofenadine 180 MG tablet  Commonly known as:  ALLEGRA  Take 180 mg by mouth daily.     fluticasone 50 MCG/ACT nasal spray  Commonly known as:  FLONASE  Place 2 sprays into the nose daily.     Fluticasone-Salmeterol 250-50 MCG/DOSE Aepb  Commonly known as:  ADVAIR  Inhale 1 puff into the lungs every 12 (twelve) hours.     levothyroxine 88 MCG tablet  Commonly known as:  SYNTHROID, LEVOTHROID  Take 1 tablet (88 mcg total) by mouth daily before breakfast.     tiotropium 18 MCG inhalation capsule  Commonly known as:  SPIRIVA  Place 18 mcg into inhaler and inhale daily.     valsartan-hydrochlorothiazide 160-12.5 MG per tablet  Commonly known as:  DIOVAN-HCT  Take 1 tablet by mouth daily.     Vitamin D (Ergocalciferol) 50000 UNITS Caps capsule  Commonly known as:  DRISDOL  Take 50,000 Units by mouth every 30 (thirty) days.         PHYSICAL EXAMINATION  Blood pressure 154/53, pulse 72, temperature 98.2 F (36.8 C), temperature source Oral, resp.  rate 16, weight 118 lb 1.6 oz (53.57 kg), SpO2 92 %.  Physical Exam  Constitutional: She is oriented to person, place, and time. Vital signs are normal. She appears unhealthy.  HENT:  Head: Normocephalic and atraumatic.  Mouth/Throat: Oropharynx is clear and moist.  Eyes: Conjunctivae and EOM are normal. Pupils are equal, round, and reactive to light. Right eye exhibits no discharge. Left eye exhibits no discharge. No scleral icterus.  Neck: Normal range of motion. Neck supple. No JVD present. No tracheal deviation present. No thyromegaly present.  Cardiovascular: Normal rate, regular rhythm, normal heart sounds and intact distal pulses.   Pulmonary/Chest: Effort normal and breath sounds normal. No respiratory distress. She has no wheezes. She has no rales. She exhibits no tenderness.  O2 via nasal cannula today at 2 L.  Abdominal: Soft. Bowel sounds are normal. She exhibits no distension and no mass. There is no tenderness. There is no rebound and no guarding.  Musculoskeletal: Normal range of motion. She exhibits no edema or tenderness.  Lymphadenopathy:    She has no cervical adenopathy.  Neurological: She is alert and oriented to person, place, and time.  Ambulates with assistance of a walker.  Skin: Skin is warm and dry. No rash noted. There is erythema. There is pallor.  Left nipple area now completely obscured with a quarter-sized lesion that is raised, red, and slightly tender with palpation.  There is no warmth, no surrounding edema, and no red streaks to site.  Psychiatric: Affect normal.  Nursing note and vitals reviewed.   LABORATORY DATA:. Appointment on 03/21/2014  Component Date Value Ref Range Status  . WBC 03/21/2014 9.5  3.9 - 10.3 10e3/uL Final  . NEUT# 03/21/2014 6.1  1.5 - 6.5 10e3/uL Final  . HGB 03/21/2014 11.0* 11.6 - 15.9 g/dL Final  . HCT 03/21/2014 33.0* 34.8 - 46.6 % Final  . Platelets 03/21/2014 483* 145 - 400 10e3/uL Final  . MCV 03/21/2014 91.2  79.5 -  101.0 fL Final  . MCH 03/21/2014 30.4  25.1 - 34.0 pg Final  . MCHC 03/21/2014 33.3  31.5 - 36.0 g/dL Final  . RBC 03/21/2014 3.62* 3.70 - 5.45 10e6/uL Final  . RDW 03/21/2014 13.0  11.2 - 14.5 % Final  . lymph# 03/21/2014 2.4  0.9 - 3.3 10e3/uL Final  . MONO# 03/21/2014 0.8  0.1 - 0.9 10e3/uL Final  . Eosinophils Absolute 03/21/2014 0.1  0.0 - 0.5 10e3/uL Final  . Basophils Absolute 03/21/2014 0.0  0.0 - 0.1 10e3/uL Final  . NEUT% 03/21/2014 64.4  38.4 - 76.8 % Final  .  LYMPH% 03/21/2014 25.0  14.0 - 49.7 % Final  . MONO% 03/21/2014 8.8  0.0 - 14.0 % Final  . EOS% 03/21/2014 1.4  0.0 - 7.0 % Final  . BASO% 03/21/2014 0.4  0.0 - 2.0 % Final  . Sodium 03/21/2014 138  136 - 145 mEq/L Final  . Potassium 03/21/2014 4.3  3.5 - 5.1 mEq/L Final  . Chloride 03/21/2014 101  98 - 109 mEq/L Final  . CO2 03/21/2014 28  22 - 29 mEq/L Final  . Glucose 03/21/2014 115  70 - 140 mg/dl Final  . BUN 03/21/2014 14.9  7.0 - 26.0 mg/dL Final  . Creatinine 03/21/2014 1.1  0.6 - 1.1 mg/dL Final  . Total Bilirubin 03/21/2014 0.28  0.20 - 1.20 mg/dL Final  . Alkaline Phosphatase 03/21/2014 85  40 - 150 U/L Final  . AST 03/21/2014 13  5 - 34 U/L Final  . ALT 03/21/2014 9  0 - 55 U/L Final  . Total Protein 03/21/2014 6.3* 6.4 - 8.3 g/dL Final  . Albumin 03/21/2014 3.1* 3.5 - 5.0 g/dL Final  . Calcium 03/21/2014 9.0  8.4 - 10.4 mg/dL Final  . Anion Gap 03/21/2014 10  3 - 11 mEq/L Final  . EGFR 03/21/2014 43* >90 ml/min/1.73 m2 Final   eGFR is calculated using the CKD-EPI Creatinine Equation (2009)     RADIOGRAPHIC STUDIES: No results found.  ASSESSMENT/PLAN:    Paget's disease and intraductal carcinoma of left breast Patient recently switched from Aromasin oral therapy to anastrozole for hopeful control of patient's left breast Paget's disease.  However, patient is now complaining of progressive/increased size of left nipple lesion; as well as some tenderness to the site.  On exam-patient has a  quarter-sized raised lesion with erythema and tenderness to site.  There is no warmth, surrounding edema, or red streaks to site.  During previous exams-was able to distinguish nipple area; but now lesion has apparently invaded entire nipple area.  After long discussion with the patient regarding future treatment options-advised patient she should follow-up with a general surgeon for further evaluation and management.  Patient prefers to review all with her primary care physician Dr. Lavone Orn prior to making the decision in regards to a surgeon consult.  Advised patient will call Dr. Delene Ruffini office; as well as send most recent progress notes.  Patient already has a follow-up visit scheduled with Dr. Lindi Adie for 04/29/2014.   Patient stated understanding of all instructions; and was in agreement with this plan of care. The patient knows to call the clinic with any problems, questions or concerns.   Review/collaboration with Dr. Lindi Adie regarding all aspects of patient's visit today.   Total time spent with patient was 25 minutes;  with greater than 75 percent of that time spent in face to face counseling regarding patient's symptoms,  and coordination of care and follow up.  Disclaimer: This note was dictated with voice recognition software. Similar sounding words can inadvertently be transcribed and may not be corrected upon review.   Drue Second, NP 03/22/2014

## 2014-03-22 NOTE — Assessment & Plan Note (Signed)
Patient recently switched from Aromasin oral therapy to anastrozole for hopeful control of patient's left breast Paget's disease.  However, patient is now complaining of progressive/increased size of left nipple lesion; as well as some tenderness to the site.  On exam-patient has a quarter-sized raised lesion with erythema and tenderness to site.  There is no warmth, surrounding edema, or red streaks to site.  During previous exams-was able to distinguish nipple area; but now lesion has apparently invaded entire nipple area.  After long discussion with the patient regarding future treatment options-advised patient she should follow-up with a general surgeon for further evaluation and management.  Patient prefers to review all with her primary care physician Dr. Lavone Orn prior to making the decision in regards to a surgeon consult.  Advised patient will call Dr. Delene Ruffini office; as well as send most recent progress notes.  Patient already has a follow-up visit scheduled with Dr. Lindi Adie for 04/29/2014.

## 2014-03-29 ENCOUNTER — Telehealth: Payer: Self-pay | Admitting: *Deleted

## 2014-03-29 NOTE — Telephone Encounter (Signed)
Pt left message re: "they said they were going to send my records to Dr. Delene Ruffini office and they have not gotten them yet"  Pt was seen 03/21/14 in [facility]; called and spoke with pt notifying her that Medical Records has sent office note from 03/21/14 to Dr. Delene Ruffini office; sent today 03/29/14 @ 8:23am  Pt verbalized understanding and expressed appreciation for call back.

## 2014-04-29 ENCOUNTER — Telehealth: Payer: Self-pay | Admitting: Hematology and Oncology

## 2014-04-29 ENCOUNTER — Ambulatory Visit (HOSPITAL_BASED_OUTPATIENT_CLINIC_OR_DEPARTMENT_OTHER): Payer: Medicare Other | Admitting: Hematology and Oncology

## 2014-04-29 VITALS — BP 147/54 | HR 83 | Temp 97.6°F | Resp 18 | Ht <= 58 in | Wt 118.5 lb

## 2014-04-29 DIAGNOSIS — Z853 Personal history of malignant neoplasm of breast: Secondary | ICD-10-CM

## 2014-04-29 DIAGNOSIS — C50912 Malignant neoplasm of unspecified site of left female breast: Secondary | ICD-10-CM

## 2014-04-29 NOTE — Assessment & Plan Note (Signed)
Pagets disease of the left breast: previously treated with Aromasin and Arimidex. Because of his lack of clinical benefit, patient discontinued treatment as of January 2016. The nipple area on the left breast had increased tenderness and crusting that finally broke down and it healed back covering that area.  Recommendation: 1. Watchful monitoring without antiestrogen therapy 2. Continue annual mammograms as recommended by Dr. Laurann Montana 3. Breast exams every 6 months will be done by [place].  Patient is very happy to hear that she does not have to go to surgery. We will see her back in 6 months.

## 2014-04-29 NOTE — Progress Notes (Signed)
Patient Care Team: Lavone Orn, MD as PCP - General (Internal Medicine) Nicholas Lose, MD as Consulting Physician (Hematology and Oncology)  DIAGNOSIS: Paget's disease left breast.  CHIEF COMPLIANT: follow-up of Pagets disease  INTERVAL HISTORY: Katherine [name] is a 81 year old lady with above-mentioned history of Paget's disease of the left breast who is currently on observation. Earlier in the year, her skin  Changed around the nipple and it finally broke up leaving the small hole in that area. This finally healed up and she feels that it's back to normal. Denies any lumps or nodules in the breasts.  REVIEW OF SYSTEMS:   Constitutional: Denies fevers, chills or abnormal weight loss Eyes: Denies blurriness of vision Ears, nose, mouth, throat, and face: Denies mucositis or sore throat Respiratory: Denies cough, dyspnea or wheezes Cardiovascular: Denies palpitation, chest discomfort or lower extremity swelling Gastrointestinal:  Denies nausea, heartburn or change in bowel habits Skin: Denies abnormal skin rashes Lymphatics: Denies new lymphadenopathy or easy bruising Neurological:Denies numbness, tingling or new weaknesses Behavioral/Psych: Mood is stable, no new changes  Breast: left nipple area has healed All other systems were reviewed with the patient and are negative.  I have reviewed the past medical history, past surgical history, social history and family history with the patient and they are unchanged from previous note.  ALLERGIES:  is allergic to sulfa antibiotics; amoxicillin; and penicillins.  MEDICATIONS:  Current Outpatient Prescriptions  Medication Sig Dispense Refill  . amLODipine (NORVASC) 5 MG tablet Take 5 mg by mouth daily.    Marland Kitchen aspirin EC 81 MG tablet Take 81 mg by mouth daily.    Marland Kitchen azelastine (ASTELIN) 137 MCG/SPRAY nasal spray Place 1 spray into the nose 2 (two) times daily. Use in each nostril as directed    . Calcium Carbonate-Vitamin D (CALTRATE 600+D PO)  Take 1 tablet by mouth 2 (two) times daily.    Marland Kitchen esomeprazole (NEXIUM) 40 MG capsule Take 40 mg by mouth daily before breakfast.    . fexofenadine (ALLEGRA) 180 MG tablet Take 180 mg by mouth daily.    . fluticasone (FLONASE) 50 MCG/ACT nasal spray Place 2 sprays into the nose daily.    . Fluticasone-Salmeterol (ADVAIR) 250-50 MCG/DOSE AEPB Inhale 1 puff into the lungs every 12 (twelve) hours.    Marland Kitchen levothyroxine (SYNTHROID, LEVOTHROID) 88 MCG tablet Take 1 tablet (88 mcg total) by mouth daily before breakfast. 30 tablet 11  . Multiple Vitamins-Minerals (CENTRUM SILVER PO) Take 1 tablet by mouth daily.    . Multiple Vitamins-Minerals (ICAPS MV) TABS Take 1 tablet by mouth daily.    Marland Kitchen tiotropium (SPIRIVA) 18 MCG inhalation capsule Place 18 mcg into inhaler and inhale daily.    . valsartan-hydrochlorothiazide (DIOVAN-HCT) 160-12.5 MG per tablet Take 1 tablet by mouth daily.    . Vitamin D, Ergocalciferol, (DRISDOL) 50000 UNITS CAPS Take 50,000 Units by mouth every 30 (thirty) days.     No current facility-administered medications for this visit.    PHYSICAL EXAMINATION: ECOG PERFORMANCE STATUS: 1 - Symptomatic but completely ambulatory  Filed Vitals:   04/29/14 1352  BP: 147/54  Pulse: 83  Temp: 97.6 F (36.4 C)  Resp: 18   Filed Weights   04/29/14 1352  Weight: 118 lb 8 oz (53.751 kg)    GENERAL:alert, no distress and comfortable SKIN: skin color, texture, turgor are normal, no rashes or significant lesions EYES: normal, Conjunctiva are pink and non-injected, sclera clear OROPHARYNX:no exudate, no erythema and lips, buccal mucosa, and tongue normal  NECK: supple, thyroid normal size, non-tender, without nodularity LYMPH:  no palpable lymphadenopathy in the cervical, axillary or inguinal LUNGS: clear to auscultation and percussion with normal breathing effort HEART: regular rate & rhythm and no murmurs and no lower extremity edema ABDOMEN:abdomen soft, non-tender and normal bowel  sounds Musculoskeletal:no cyanosis of digits and no clubbing  NEURO: alert & oriented x 3 with fluent speech, no focal motor/sensory deficits BREAST:no palpable nodules in either breast. The left nipple has healed with granulation tissue. No palpable axillary supraclavicular or infraclavicular adenopathy no breast tenderness or nipple discharge. (exam performed in the presence of a chaperone)  LABORATORY DATA:  I have reviewed the data as listed   Chemistry      Component Value Date/Time   NA 138 03/21/2014 1249   NA 136 03/19/2012 0422   K 4.3 03/21/2014 1249   K 4.2 03/19/2012 0422   CL 102 03/19/2012 0422   CO2 28 03/21/2014 1249   CO2 28 03/19/2012 0422   BUN 14.9 03/21/2014 1249   BUN 25* 03/19/2012 0422   CREATININE 1.1 03/21/2014 1249   CREATININE 0.97 03/19/2012 0422      Component Value Date/Time   CALCIUM 9.0 03/21/2014 1249   CALCIUM 8.4 03/19/2012 0422   ALKPHOS 85 03/21/2014 1249   ALKPHOS 54 03/16/2012 1100   AST 13 03/21/2014 1249   AST 21 03/16/2012 1100   ALT 9 03/21/2014 1249   ALT 14 03/16/2012 1100   BILITOT 0.28 03/21/2014 1249   BILITOT 0.5 03/16/2012 1100       Lab Results  Component Value Date   WBC 9.5 03/21/2014   HGB 11.0* 03/21/2014   HCT 33.0* 03/21/2014   MCV 91.2 03/21/2014   PLT 483* 03/21/2014   NEUTROABS 6.1 03/21/2014   ASSESSMENT & PLAN:  Paget's disease and intraductal carcinoma of left breast Pagets disease of the left breast: previously treated with Aromasin and Arimidex. Because of his lack of clinical benefit, patient discontinued treatment as of January 2016. The nipple area on the left breast had increased tenderness and crusting that finally broke down and it healed back covering that area.  Recommendation: 1. Watchful monitoring without antiestrogen therapy 2. Continue annual mammograms if recommended by Dr. Laurann Montana 3. Breast exams every 6 months will be done by [place].  Patient is very happy to hear that she does not  have to go to surgery. We will see her back in 6 months.  No orders of the defined types were placed in this encounter.   The patient has a good understanding of the overall plan. she agrees with it. She will call with any problems that may develop before her next visit here.   Rulon Eisenmenger, MD

## 2014-04-29 NOTE — Telephone Encounter (Signed)
No pof sent yet,pt states 26mths,appt made and avs printed for pt and will advise her if anything is added

## 2014-05-06 ENCOUNTER — Encounter: Payer: Self-pay | Admitting: Podiatry

## 2014-05-06 ENCOUNTER — Ambulatory Visit (INDEPENDENT_AMBULATORY_CARE_PROVIDER_SITE_OTHER): Payer: Medicare Other | Admitting: Podiatry

## 2014-05-06 DIAGNOSIS — M79673 Pain in unspecified foot: Secondary | ICD-10-CM | POA: Diagnosis not present

## 2014-05-06 DIAGNOSIS — B351 Tinea unguium: Secondary | ICD-10-CM

## 2014-05-06 DIAGNOSIS — L84 Corns and callosities: Secondary | ICD-10-CM

## 2014-05-06 NOTE — Progress Notes (Signed)
Subjective:     Patient ID: Katherine [name], female   DOB: [birth year], 81 y.o.   MRN: [id number]  HPI patient presents with thick nails 1-5 both feet that are becoming increasingly painful and lesions under both feet that she cannot take care of   Review of Systems     Objective:   Physical Exam Neurovascular status diminished but unchanged with thick yellow brittle nailbeds 1-5 both feet that are painful and mycotic keratotic lesions plantar aspect both feet that she cannot take care of    Assessment:     Painful mycotic nail infections 1-5 both feet with lesions plantar both feet    Plan:     Debridement nailbeds 1-5 both feet with no iatrogenic bleeding noted and debridement lesions on both feet with no iatrogenic bleeding noted

## 2014-05-06 NOTE — Progress Notes (Signed)
Subjective:     Patient ID: Katherine [name], female   DOB: January 10, 1918, 81 y.o.   MRN: [id number]  HPI nail and lesion disease noted today with thickness and inability for her to cut   Review of Systems     Objective:   Physical Exam Neurovascular status unchanged with thick yellow brittle nails and callus formation    Assessment:     Mycotic nail infection with lesion formation    Plan:     Debride nails and lesions with no iatrogenic bleeding noted

## 2014-05-18 DIAGNOSIS — K5909 Other constipation: Secondary | ICD-10-CM | POA: Diagnosis not present

## 2014-06-11 ENCOUNTER — Ambulatory Visit: Payer: Medicare Other

## 2014-06-11 DIAGNOSIS — B351 Tinea unguium: Secondary | ICD-10-CM

## 2014-06-11 DIAGNOSIS — L84 Corns and callosities: Secondary | ICD-10-CM

## 2014-06-11 DIAGNOSIS — M79673 Pain in unspecified foot: Secondary | ICD-10-CM

## 2014-06-14 NOTE — Progress Notes (Signed)
Subjective:     Patient ID: Katherine [name], female   DOB: 03/31/1917, 81 y.o.   MRN: [id number]  HPI patient presents with thick nails 1-5 both feet that are becoming increasingly painful and lesions under both feet that she cannot take care of   Review of Systems     Objective:   Physical Exam Neurovascular status diminished but unchanged with thick yellow brittle nailbeds 1-5 both feet that are painful and mycotic keratotic lesions plantar aspect both feet that she cannot take care of    Assessment:     Painful mycotic nail infections 1-5 both feet with lesions plantar both feet    Plan:     Debridement nailbeds 1-5 both feet with no iatrogenic bleeding noted and debridement lesions on both feet with no iatrogenic bleeding noted      

## 2014-07-08 ENCOUNTER — Ambulatory Visit: Payer: Medicare Other

## 2014-07-22 ENCOUNTER — Emergency Department (HOSPITAL_COMMUNITY)
Admission: EM | Admit: 2014-07-22 | Discharge: 2014-07-22 | Disposition: A | Discharge status: Home / Self Care | Payer: Medicare Other | Attending: Emergency Medicine | Admitting: Emergency Medicine

## 2014-07-22 ENCOUNTER — Encounter (HOSPITAL_COMMUNITY): Payer: Self-pay | Admitting: Emergency Medicine

## 2014-07-22 ENCOUNTER — Emergency Department (HOSPITAL_COMMUNITY): Payer: Medicare Other

## 2014-07-22 DIAGNOSIS — S92192A Other fracture of left talus, initial encounter for closed fracture: Secondary | ICD-10-CM | POA: Diagnosis not present

## 2014-07-22 DIAGNOSIS — I10 Essential (primary) hypertension: Secondary | ICD-10-CM | POA: Diagnosis not present

## 2014-07-22 DIAGNOSIS — Z87440 Personal history of urinary (tract) infections: Secondary | ICD-10-CM | POA: Insufficient documentation

## 2014-07-22 DIAGNOSIS — Z8673 Personal history of transient ischemic attack (TIA), and cerebral infarction without residual deficits: Secondary | ICD-10-CM | POA: Insufficient documentation

## 2014-07-22 DIAGNOSIS — Y998 Other external cause status: Secondary | ICD-10-CM | POA: Diagnosis not present

## 2014-07-22 DIAGNOSIS — S99912A Unspecified injury of left ankle, initial encounter: Secondary | ICD-10-CM | POA: Diagnosis present

## 2014-07-22 DIAGNOSIS — Z853 Personal history of malignant neoplasm of breast: Secondary | ICD-10-CM | POA: Insufficient documentation

## 2014-07-22 DIAGNOSIS — E785 Hyperlipidemia, unspecified: Secondary | ICD-10-CM | POA: Diagnosis not present

## 2014-07-22 DIAGNOSIS — Z8619 Personal history of other infectious and parasitic diseases: Secondary | ICD-10-CM | POA: Insufficient documentation

## 2014-07-22 DIAGNOSIS — Z79899 Other long term (current) drug therapy: Secondary | ICD-10-CM | POA: Diagnosis not present

## 2014-07-22 DIAGNOSIS — M1990 Unspecified osteoarthritis, unspecified site: Secondary | ICD-10-CM | POA: Diagnosis not present

## 2014-07-22 DIAGNOSIS — E039 Hypothyroidism, unspecified: Secondary | ICD-10-CM | POA: Insufficient documentation

## 2014-07-22 DIAGNOSIS — Y9289 Other specified places as the place of occurrence of the external cause: Secondary | ICD-10-CM | POA: Insufficient documentation

## 2014-07-22 DIAGNOSIS — K219 Gastro-esophageal reflux disease without esophagitis: Secondary | ICD-10-CM | POA: Diagnosis not present

## 2014-07-22 DIAGNOSIS — Y9389 Activity, other specified: Secondary | ICD-10-CM | POA: Insufficient documentation

## 2014-07-22 DIAGNOSIS — M810 Age-related osteoporosis without current pathological fracture: Secondary | ICD-10-CM | POA: Diagnosis not present

## 2014-07-22 DIAGNOSIS — Z87891 Personal history of nicotine dependence: Secondary | ICD-10-CM | POA: Insufficient documentation

## 2014-07-22 DIAGNOSIS — S8265XA Nondisplaced fracture of lateral malleolus of left fibula, initial encounter for closed fracture: Secondary | ICD-10-CM | POA: Diagnosis not present

## 2014-07-22 DIAGNOSIS — Z8701 Personal history of pneumonia (recurrent): Secondary | ICD-10-CM | POA: Diagnosis not present

## 2014-07-22 DIAGNOSIS — Z7982 Long term (current) use of aspirin: Secondary | ICD-10-CM | POA: Diagnosis not present

## 2014-07-22 DIAGNOSIS — Z8542 Personal history of malignant neoplasm of other parts of uterus: Secondary | ICD-10-CM | POA: Diagnosis not present

## 2014-07-22 DIAGNOSIS — S8256XA Nondisplaced fracture of medial malleolus of unspecified tibia, initial encounter for closed fracture: Secondary | ICD-10-CM | POA: Insufficient documentation

## 2014-07-22 DIAGNOSIS — J449 Chronic obstructive pulmonary disease, unspecified: Secondary | ICD-10-CM | POA: Diagnosis not present

## 2014-07-22 DIAGNOSIS — Z7951 Long term (current) use of inhaled steroids: Secondary | ICD-10-CM | POA: Insufficient documentation

## 2014-07-22 DIAGNOSIS — S81812A Laceration without foreign body, left lower leg, initial encounter: Secondary | ICD-10-CM | POA: Insufficient documentation

## 2014-07-22 DIAGNOSIS — Z880 Allergy status to penicillin: Secondary | ICD-10-CM | POA: Diagnosis not present

## 2014-07-22 DIAGNOSIS — W1839XA Other fall on same level, initial encounter: Secondary | ICD-10-CM | POA: Diagnosis not present

## 2014-07-22 DIAGNOSIS — S8262XA Displaced fracture of lateral malleolus of left fibula, initial encounter for closed fracture: Secondary | ICD-10-CM | POA: Diagnosis not present

## 2014-07-22 DIAGNOSIS — S92102A Unspecified fracture of left talus, initial encounter for closed fracture: Secondary | ICD-10-CM | POA: Diagnosis not present

## 2014-07-22 DIAGNOSIS — S82892A Other fracture of left lower leg, initial encounter for closed fracture: Secondary | ICD-10-CM

## 2014-07-22 MED ORDER — BACITRACIN ZINC 500 UNIT/GM EX OINT
TOPICAL_OINTMENT | CUTANEOUS | Status: AC
  Administered 2014-07-22: 13:00:00
  Filled 2014-07-22: qty 0.9

## 2014-07-22 NOTE — ED Notes (Signed)
Pt c/o trip and fall last night, pt states she turned her ankle. Denies head injury or LOC. Pt denies use of blood thinners, except for baby aspirin.

## 2014-07-22 NOTE — ED Notes (Signed)
Pt updated-SW is on campus now and is in process of filling out FL2 so that she can go to assisted living tonight. Pt acknowledges understanding.

## 2014-07-22 NOTE — ED Notes (Signed)
Bed: [facility] Expected date:  Expected time:  Means of arrival:  Comments: EMS - Vertigo,

## 2014-07-22 NOTE — Discharge Instructions (Signed)
Please read and follow all provided instructions.  Your diagnoses today include:  1. Ankle fracture, left, closed, initial encounter     Tests performed today include:  An x-ray of the affected area - shows two broken bones in the ankle  Vital signs. See below for your results today.   Medications prescribed:   None  Take any prescribed medications only as directed.  Home care instructions:   Follow any educational materials contained in this packet  Use tylenol as directed on the packaging for pain  Do not bear weight on the leg if possible  Follow R.I.C.E. Protocol:  R - rest your injury   I  - use ice on injury without applying directly to skin  C - compress injury with bandage or splint  E - elevate the injury as much as possible  Follow-up instructions: Please follow-up with the provided orthopedic physician (bone specialist) for further evaluation of your broken ankle.   Return instructions:   Please return if your toes or feet are numb or tingling, appear gray or blue, or you have severe pain (also elevate the leg and loosen splint or wrap if you were given one)  Please return to the Emergency Department if you experience worsening symptoms.   Please return if you have any other emergent concerns.  Additional Information:  Your vital signs today were: BP 126/74 mmHg   Pulse 80   Temp(Src) 98.2 F (36.8 C) (Oral)   Resp 18   SpO2 98% If your blood pressure (BP) was elevated above 135/85 this visit, please have this repeated by your doctor within one month. --------------

## 2014-07-22 NOTE — ED Notes (Signed)
Contacted sw r/t patient being DC. Pt currently lives in independent living [facility]. Pt may need assisted living

## 2014-07-22 NOTE — ED Provider Notes (Signed)
CSN: [id number]     Arrival date & time 07/22/14  1206 History   First MD Initiated Contact with Patient 07/22/14 1308     Chief Complaint  Patient presents with  . Fall  . Ankle Injury     (Consider location/radiation/quality/duration/timing/severity/associated sxs/prior Treatment) HPI Comments: Patient presents with complaint of left ankle pain and swelling as well as a skin tear which began acutely after she stood up from a chair last night and twisted her left ankle.  She states that she is not able to bear very much weight on the leg. No hip pain. She did not hit her head. No chest pain, lightheadedness, or dizziness prior to the fall. Patient does not use anti-coagulants. No tx PTA.   Patient is a 81 y.o. female presenting with fall and lower extremity injury. The history is provided by the patient.  Fall Associated symptoms include arthralgias and joint swelling. Pertinent negatives include no chest pain, neck pain, numbness or weakness.  Ankle Injury Associated symptoms include arthralgias and joint swelling. Pertinent negatives include no chest pain, neck pain, numbness or weakness.    Past Medical History  Diagnosis Date  . Thyroid disease   . Hypertension   . Hyperlipemia   . Multiple allergies   . COPD (chronic obstructive pulmonary disease)   . Hypothyroidism   . Pneumonia   . Stroke 2010    no residual  . GERD (gastroesophageal reflux disease)   . Arthritis   . History of shingles 2007  . Constipation   . Osteoporosis   . Fatigue   . Seasonal allergies   . Frequent UTI   . Uterine cancer   . Paget's carcinoma of the nipple 03/16/2013   Past Surgical History  Procedure Laterality Date  . Abdominal hysterectomy    . Breast lumpectomy      left breast  . Tonsillectomy    . Eye surgery      cataracts removed bilaterally  . Carotid endarterectomy Left June 10, 2008    CE   Family History  Problem Relation Age of Onset  . Asthma Paternal Grandmother   .  Heart failure Mother   . Heart disease Mother   . Heart failure Father   . Heart disease Father   . Heart disease Brother    History  Substance Use Topics  . Smoking status: Former Smoker -- 1.00 packs/day for 40 years    Types: Cigarettes    Quit date: 02/15/1981  . Smokeless tobacco: Never Used  . Alcohol Use: Yes   OB History    No data available     Review of Systems  Constitutional: Negative for activity change.  Respiratory: Negative for shortness of breath.   Cardiovascular: Negative for chest pain.  Musculoskeletal: Positive for joint swelling, arthralgias and gait problem. Negative for back pain and neck pain.  Skin: Negative for wound.  Neurological: Negative for weakness, light-headedness and numbness.    Allergies  Sulfa antibiotics; Amoxicillin; and Penicillins  Home Medications   Prior to Admission medications   Medication Sig Start Date End Date Taking? Authorizing Provider  amLODipine (NORVASC) 5 MG tablet Take 5 mg by mouth daily.    Historical Provider, MD  aspirin EC 81 MG tablet Take 81 mg by mouth daily.    Historical Provider, MD  azelastine (ASTELIN) 137 MCG/SPRAY nasal spray Place 1 spray into the nose 2 (two) times daily. Use in each nostril as directed    Historical Provider, MD  Calcium Carbonate-Vitamin D (CALTRATE 600+D PO) Take 1 tablet by mouth 2 (two) times daily.    Historical Provider, MD  esomeprazole (NEXIUM) 40 MG capsule Take 40 mg by mouth daily before breakfast.    Historical Provider, MD  fexofenadine (ALLEGRA) 180 MG tablet Take 180 mg by mouth daily.    Historical Provider, MD  fluticasone (FLONASE) 50 MCG/ACT nasal spray Place 2 sprays into the nose daily.    Historical Provider, MD  Fluticasone-Salmeterol (ADVAIR) 250-50 MCG/DOSE AEPB Inhale 1 puff into the lungs every 12 (twelve) hours.    Historical Provider, MD  levothyroxine (SYNTHROID, LEVOTHROID) 88 MCG tablet Take 1 tablet (88 mcg total) by mouth daily before breakfast.  03/20/12   Lavone Orn, MD  Multiple Vitamins-Minerals (CENTRUM SILVER PO) Take 1 tablet by mouth daily.    Historical Provider, MD  Multiple Vitamins-Minerals (ICAPS MV) TABS Take 1 tablet by mouth daily.    Historical Provider, MD  tiotropium (SPIRIVA) 18 MCG inhalation capsule Place 18 mcg into inhaler and inhale daily.    Historical Provider, MD  valsartan-hydrochlorothiazide (DIOVAN-HCT) 160-12.5 MG per tablet Take 1 tablet by mouth daily.    Historical Provider, MD  Vitamin D, Ergocalciferol, (DRISDOL) 50000 UNITS CAPS Take 50,000 Units by mouth every 30 (thirty) days.    Historical Provider, MD   BP 129/77 mmHg  Pulse 84  Temp(Src) 98.1 F (36.7 C) (Oral)  Resp 19  SpO2 98%   Physical Exam  Constitutional: She appears well-developed and well-nourished.  HENT:  Head: Normocephalic and atraumatic.  Mouth/Throat: Oropharynx is clear and moist.  Eyes: Pupils are equal, round, and reactive to light.  Neck: Normal range of motion. Neck supple.  Cardiovascular: Normal rate.  Exam reveals no decreased pulses.   No murmur heard. Pulses:      Dorsalis pedis pulses are 2+ on the right side, and 2+ on the left side.  Pulmonary/Chest: No respiratory distress. She has no wheezes. She has no rales.  Musculoskeletal: She exhibits tenderness. She exhibits no edema.       Left hip: Normal.       Left knee: Normal.       Left ankle: She exhibits decreased range of motion and swelling. Tenderness. Lateral malleolus tenderness found. No medial malleolus, no head of 5th metatarsal and no proximal fibula tenderness found. Achilles tendon normal.       Left lower leg: She exhibits laceration (skin tear, clean).       Left foot: Normal.  Neurological: She is alert. No sensory deficit.  Motor, sensation, and vascular distal to the injury is fully intact.   Skin: Skin is warm and dry.  Psychiatric: She has a normal mood and affect.  Nursing note and vitals reviewed.   ED Course  Procedures  (including critical care time) Labs Review Labs Reviewed - No data to display  Imaging Review Dg Ankle Complete Left  07/22/2014   CLINICAL DATA:  [place] at home last night injuring LEFT ankle, initial encounter, edema, pain  EXAM: LEFT ANKLE COMPLETE - 3+ VIEW  COMPARISON:  None  FINDINGS: Soft tissue swelling greatest laterally.  Diffuse osseous demineralization.  Joint spaces preserved.  Nondisplaced lateral malleolar fracture.  Additional minimally displaced fracture arising from the dorsal margin of the distal talus.  No additional fracture, dislocation, or bone destruction.  IMPRESSION: Nondisplaced lateral malleolar fracture.  Minimally displaced fracture from dorsal margin of distal talus.   Electronically Signed   By: Lavonia Dana M.D.   On:  07/22/2014 13:40     EKG Interpretation None       1:25 PM Patient seen and examined. Work-up initiated. Discussed with Dr. Regenia Skeeter who will see.   Vital signs reviewed and are as follows: BP 129/77 mmHg  Pulse 84  Temp(Src) 98.1 F (36.7 C) (Oral)  Resp 19  SpO2 98%  3:14 PM Patient was informed of results. She has been using a wheelchair device to help her get around because she is not able to bear weight. Will place in a Cam Walker as this will allow her more ease with transferring. Orthopedic follow-up given. Patient wants to use Tylenol at home for pain. Nurse is contacting Education officer, museum regarding escalation of care at her independent living facility as she may need assisted living with her current broken bone.  MDM   Final diagnoses:  Ankle fracture, left, closed, initial encounter   Patient with fibular and talar fracture in foot. She will need f/u. Lower  Extremity is neurovascularly intact. Pain controlled with conservative measures. Immobilization with Cam Walker per above. Appropriate follow-up given. No other injuries suspected from fall. Do not suspect knee or hip injury.  No dangerous or life-threatening conditions suspected  or identified by history, physical exam, and by work-up. No indications for hospitalization identified.    Carlisle Cater, PA-C 07/22/14 [place], MD 07/27/14 2030

## 2014-07-22 NOTE — Progress Notes (Signed)
CSW was notified by Nurse that the pt comes from [facility]. She states that the pt is in [place], but needs to be transferred to their assisted living unit.  CSW spoke with amber of [facility] who states that the pt is welcomed to come back to the facility once the [facility] is complete. She states that a [place] is not needed at this time.  CSW completed FL2. CSW spoke with Nurse Sherlynn Stalls who informed CSW to fax the FL2 to [phone]. Nurse Sherlynn Stalls states that the [facility] document has been received and that the pt is now welcomed to come back.  Husband states that he will be driving the pt back. [facility] Nurse and facility nurse made aware of choice of transportation.  Patient was given hard copy of FL2 and informed to give it to nurse once she gets to the facility.   Patient states that she does not have any questions as this time.  Willette Brace [phone] ED CSW 07/22/2014 6:35 PM

## 2014-07-22 NOTE — ED Notes (Signed)
Patient transported to X-ray 

## 2014-07-24 DIAGNOSIS — S8265XA Nondisplaced fracture of lateral malleolus of left fibula, initial encounter for closed fracture: Secondary | ICD-10-CM | POA: Diagnosis not present

## 2014-08-07 DIAGNOSIS — S8265XD Nondisplaced fracture of lateral malleolus of left fibula, subsequent encounter for closed fracture with routine healing: Secondary | ICD-10-CM | POA: Diagnosis not present

## 2014-08-13 ENCOUNTER — Ambulatory Visit: Payer: Medicare Other | Admitting: Podiatry

## 2014-08-27 DIAGNOSIS — M6281 Muscle weakness (generalized): Secondary | ICD-10-CM | POA: Diagnosis not present

## 2014-08-27 DIAGNOSIS — M80072K Age-related osteoporosis with current pathological fracture, left ankle and foot, subsequent encounter for fracture with nonunion: Secondary | ICD-10-CM | POA: Diagnosis not present

## 2014-08-27 DIAGNOSIS — R2681 Unsteadiness on feet: Secondary | ICD-10-CM | POA: Diagnosis not present

## 2014-08-29 DIAGNOSIS — R2681 Unsteadiness on feet: Secondary | ICD-10-CM | POA: Diagnosis not present

## 2014-08-29 DIAGNOSIS — M80072K Age-related osteoporosis with current pathological fracture, left ankle and foot, subsequent encounter for fracture with nonunion: Secondary | ICD-10-CM | POA: Diagnosis not present

## 2014-08-29 DIAGNOSIS — M6281 Muscle weakness (generalized): Secondary | ICD-10-CM | POA: Diagnosis not present

## 2014-08-30 DIAGNOSIS — M80072K Age-related osteoporosis with current pathological fracture, left ankle and foot, subsequent encounter for fracture with nonunion: Secondary | ICD-10-CM | POA: Diagnosis not present

## 2014-08-30 DIAGNOSIS — M6281 Muscle weakness (generalized): Secondary | ICD-10-CM | POA: Diagnosis not present

## 2014-08-30 DIAGNOSIS — R2681 Unsteadiness on feet: Secondary | ICD-10-CM | POA: Diagnosis not present

## 2014-09-02 DIAGNOSIS — J449 Chronic obstructive pulmonary disease, unspecified: Secondary | ICD-10-CM | POA: Diagnosis not present

## 2014-09-02 DIAGNOSIS — K219 Gastro-esophageal reflux disease without esophagitis: Secondary | ICD-10-CM | POA: Diagnosis not present

## 2014-09-02 DIAGNOSIS — I129 Hypertensive chronic kidney disease with stage 1 through stage 4 chronic kidney disease, or unspecified chronic kidney disease: Secondary | ICD-10-CM | POA: Diagnosis not present

## 2014-09-02 DIAGNOSIS — M810 Age-related osteoporosis without current pathological fracture: Secondary | ICD-10-CM | POA: Diagnosis not present

## 2014-09-02 DIAGNOSIS — L84 Corns and callosities: Secondary | ICD-10-CM

## 2014-09-02 DIAGNOSIS — Z0000 Encounter for general adult medical examination without abnormal findings: Secondary | ICD-10-CM | POA: Diagnosis not present

## 2014-09-02 DIAGNOSIS — Z1389 Encounter for screening for other disorder: Secondary | ICD-10-CM | POA: Diagnosis not present

## 2014-09-02 DIAGNOSIS — E780 Pure hypercholesterolemia: Secondary | ICD-10-CM | POA: Diagnosis not present

## 2014-09-02 DIAGNOSIS — E039 Hypothyroidism, unspecified: Secondary | ICD-10-CM | POA: Diagnosis not present

## 2014-09-02 DIAGNOSIS — N183 Chronic kidney disease, stage 3 (moderate): Secondary | ICD-10-CM | POA: Diagnosis not present

## 2014-09-03 DIAGNOSIS — M80072K Age-related osteoporosis with current pathological fracture, left ankle and foot, subsequent encounter for fracture with nonunion: Secondary | ICD-10-CM | POA: Diagnosis not present

## 2014-09-03 DIAGNOSIS — R2681 Unsteadiness on feet: Secondary | ICD-10-CM | POA: Diagnosis not present

## 2014-09-03 DIAGNOSIS — M6281 Muscle weakness (generalized): Secondary | ICD-10-CM | POA: Diagnosis not present

## 2014-09-04 DIAGNOSIS — M6281 Muscle weakness (generalized): Secondary | ICD-10-CM | POA: Diagnosis not present

## 2014-09-04 DIAGNOSIS — M80072K Age-related osteoporosis with current pathological fracture, left ankle and foot, subsequent encounter for fracture with nonunion: Secondary | ICD-10-CM | POA: Diagnosis not present

## 2014-09-04 DIAGNOSIS — S8265XD Nondisplaced fracture of lateral malleolus of left fibula, subsequent encounter for closed fracture with routine healing: Secondary | ICD-10-CM | POA: Diagnosis not present

## 2014-09-04 DIAGNOSIS — R2681 Unsteadiness on feet: Secondary | ICD-10-CM | POA: Diagnosis not present

## 2014-09-04 DIAGNOSIS — M1711 Unilateral primary osteoarthritis, right knee: Secondary | ICD-10-CM | POA: Diagnosis not present

## 2014-09-06 DIAGNOSIS — R2681 Unsteadiness on feet: Secondary | ICD-10-CM | POA: Diagnosis not present

## 2014-09-06 DIAGNOSIS — M6281 Muscle weakness (generalized): Secondary | ICD-10-CM | POA: Diagnosis not present

## 2014-09-06 DIAGNOSIS — M80072K Age-related osteoporosis with current pathological fracture, left ankle and foot, subsequent encounter for fracture with nonunion: Secondary | ICD-10-CM | POA: Diagnosis not present

## 2014-09-10 DIAGNOSIS — M6281 Muscle weakness (generalized): Secondary | ICD-10-CM | POA: Diagnosis not present

## 2014-09-10 DIAGNOSIS — M80072K Age-related osteoporosis with current pathological fracture, left ankle and foot, subsequent encounter for fracture with nonunion: Secondary | ICD-10-CM | POA: Diagnosis not present

## 2014-09-10 DIAGNOSIS — R2681 Unsteadiness on feet: Secondary | ICD-10-CM | POA: Diagnosis not present

## 2014-09-13 DIAGNOSIS — M6281 Muscle weakness (generalized): Secondary | ICD-10-CM | POA: Diagnosis not present

## 2014-09-13 DIAGNOSIS — R2681 Unsteadiness on feet: Secondary | ICD-10-CM | POA: Diagnosis not present

## 2014-09-13 DIAGNOSIS — M80072K Age-related osteoporosis with current pathological fracture, left ankle and foot, subsequent encounter for fracture with nonunion: Secondary | ICD-10-CM | POA: Diagnosis not present

## 2014-09-17 ENCOUNTER — Ambulatory Visit (INDEPENDENT_AMBULATORY_CARE_PROVIDER_SITE_OTHER): Payer: Medicare Other | Admitting: Podiatry

## 2014-09-17 DIAGNOSIS — Q828 Other specified congenital malformations of skin: Secondary | ICD-10-CM | POA: Diagnosis not present

## 2014-09-17 DIAGNOSIS — M6281 Muscle weakness (generalized): Secondary | ICD-10-CM | POA: Diagnosis not present

## 2014-09-17 DIAGNOSIS — M79673 Pain in unspecified foot: Secondary | ICD-10-CM

## 2014-09-17 DIAGNOSIS — R2681 Unsteadiness on feet: Secondary | ICD-10-CM | POA: Diagnosis not present

## 2014-09-17 DIAGNOSIS — B351 Tinea unguium: Secondary | ICD-10-CM | POA: Diagnosis not present

## 2014-09-17 DIAGNOSIS — M80072K Age-related osteoporosis with current pathological fracture, left ankle and foot, subsequent encounter for fracture with nonunion: Secondary | ICD-10-CM | POA: Diagnosis not present

## 2014-09-17 NOTE — Progress Notes (Signed)
Subjective:     Patient ID: Katherine [name], female   DOB: [birth year], 81 y.o.   MRN: [id number]  HPIThis patient presents to the office with painful nails and calluses both feet.  She says her nails are thick and long and are painful walking and wearing her shoes.  She also has callus under both feet as well as corns on toes both feet.  She presents for preventive foot care services.   Review of Systems     Objective:   Physical Exam GENERAL APPEARANCE: Alert, conversant. Appropriately groomed. No acute distress.  VASCULAR: Pedal pulses palpable at 2/4 DP and PT bilateral.  Capillary refill time is immediate to all digits,  Proximal to distal cooling it warm to warm.  Digital hair growth is present bilateral  NEUROLOGIC: sensation is intact epicritically and protectively to 5.07 monofilament at 5/5 sites bilateral.  Light touch is intact bilateral, vibratory sensation intact bilateral, achilles tendon reflex is intact bilateral.  MUSCULOSKELETAL: acceptable muscle strength, tone and stability bilateral.  Intrinsic muscluature intact bilateral.  Rectus appearance of foot and digits noted bilateral.   DERMATOLOGIC: skin color, texture, and turgor are within normal limits.  No preulcerative lesions or ulcers  are seen, no interdigital maceration noted.  No open lesions present.  . No drainage noted.   Porokeratosis sub 2,4 right foot callus sub3rd right foot.  NAILS  Thick disfigured discolored nails both feet.      Assessment:     Onychomycosis B/L  Porokeratosis right foot     Plan:     Debridement of Onychomycosis   Debridement of Porokeratosis.  RTC 9 weeks.

## 2014-09-18 DIAGNOSIS — R2681 Unsteadiness on feet: Secondary | ICD-10-CM | POA: Diagnosis not present

## 2014-09-18 DIAGNOSIS — M80072K Age-related osteoporosis with current pathological fracture, left ankle and foot, subsequent encounter for fracture with nonunion: Secondary | ICD-10-CM | POA: Diagnosis not present

## 2014-09-18 DIAGNOSIS — M6281 Muscle weakness (generalized): Secondary | ICD-10-CM | POA: Diagnosis not present

## 2014-09-20 DIAGNOSIS — M6281 Muscle weakness (generalized): Secondary | ICD-10-CM | POA: Diagnosis not present

## 2014-09-20 DIAGNOSIS — R2681 Unsteadiness on feet: Secondary | ICD-10-CM | POA: Diagnosis not present

## 2014-09-20 DIAGNOSIS — M80072K Age-related osteoporosis with current pathological fracture, left ankle and foot, subsequent encounter for fracture with nonunion: Secondary | ICD-10-CM | POA: Diagnosis not present

## 2014-09-24 DIAGNOSIS — R2681 Unsteadiness on feet: Secondary | ICD-10-CM | POA: Diagnosis not present

## 2014-09-24 DIAGNOSIS — M6281 Muscle weakness (generalized): Secondary | ICD-10-CM | POA: Diagnosis not present

## 2014-09-24 DIAGNOSIS — M80072K Age-related osteoporosis with current pathological fracture, left ankle and foot, subsequent encounter for fracture with nonunion: Secondary | ICD-10-CM | POA: Diagnosis not present

## 2014-09-26 DIAGNOSIS — M6281 Muscle weakness (generalized): Secondary | ICD-10-CM | POA: Diagnosis not present

## 2014-09-26 DIAGNOSIS — R2681 Unsteadiness on feet: Secondary | ICD-10-CM | POA: Diagnosis not present

## 2014-09-26 DIAGNOSIS — M80072K Age-related osteoporosis with current pathological fracture, left ankle and foot, subsequent encounter for fracture with nonunion: Secondary | ICD-10-CM | POA: Diagnosis not present

## 2014-09-27 DIAGNOSIS — M6281 Muscle weakness (generalized): Secondary | ICD-10-CM | POA: Diagnosis not present

## 2014-09-27 DIAGNOSIS — R2681 Unsteadiness on feet: Secondary | ICD-10-CM | POA: Diagnosis not present

## 2014-09-27 DIAGNOSIS — M80072K Age-related osteoporosis with current pathological fracture, left ankle and foot, subsequent encounter for fracture with nonunion: Secondary | ICD-10-CM | POA: Diagnosis not present

## 2014-10-01 DIAGNOSIS — R2681 Unsteadiness on feet: Secondary | ICD-10-CM | POA: Diagnosis not present

## 2014-10-01 DIAGNOSIS — M80072K Age-related osteoporosis with current pathological fracture, left ankle and foot, subsequent encounter for fracture with nonunion: Secondary | ICD-10-CM | POA: Diagnosis not present

## 2014-10-01 DIAGNOSIS — M6281 Muscle weakness (generalized): Secondary | ICD-10-CM | POA: Diagnosis not present

## 2014-10-02 DIAGNOSIS — M80072K Age-related osteoporosis with current pathological fracture, left ankle and foot, subsequent encounter for fracture with nonunion: Secondary | ICD-10-CM | POA: Diagnosis not present

## 2014-10-02 DIAGNOSIS — R2681 Unsteadiness on feet: Secondary | ICD-10-CM | POA: Diagnosis not present

## 2014-10-02 DIAGNOSIS — M6281 Muscle weakness (generalized): Secondary | ICD-10-CM | POA: Diagnosis not present

## 2014-10-04 DIAGNOSIS — M6281 Muscle weakness (generalized): Secondary | ICD-10-CM | POA: Diagnosis not present

## 2014-10-04 DIAGNOSIS — R2681 Unsteadiness on feet: Secondary | ICD-10-CM | POA: Diagnosis not present

## 2014-10-04 DIAGNOSIS — M80072K Age-related osteoporosis with current pathological fracture, left ankle and foot, subsequent encounter for fracture with nonunion: Secondary | ICD-10-CM | POA: Diagnosis not present

## 2014-10-08 DIAGNOSIS — M80072K Age-related osteoporosis with current pathological fracture, left ankle and foot, subsequent encounter for fracture with nonunion: Secondary | ICD-10-CM | POA: Diagnosis not present

## 2014-10-08 DIAGNOSIS — M6281 Muscle weakness (generalized): Secondary | ICD-10-CM | POA: Diagnosis not present

## 2014-10-08 DIAGNOSIS — R2681 Unsteadiness on feet: Secondary | ICD-10-CM | POA: Diagnosis not present

## 2014-10-16 DIAGNOSIS — S8265XD Nondisplaced fracture of lateral malleolus of left fibula, subsequent encounter for closed fracture with routine healing: Secondary | ICD-10-CM | POA: Diagnosis not present

## 2014-11-04 ENCOUNTER — Encounter: Payer: Self-pay | Admitting: Hematology and Oncology

## 2014-11-04 ENCOUNTER — Ambulatory Visit (HOSPITAL_BASED_OUTPATIENT_CLINIC_OR_DEPARTMENT_OTHER): Payer: Medicare Other | Admitting: Hematology and Oncology

## 2014-11-04 ENCOUNTER — Telehealth: Payer: Self-pay | Admitting: Hematology and Oncology

## 2014-11-04 VITALS — BP 155/51 | HR 78 | Temp 97.5°F | Resp 18 | Ht <= 58 in | Wt 115.5 lb

## 2014-11-04 DIAGNOSIS — C50912 Malignant neoplasm of unspecified site of left female breast: Secondary | ICD-10-CM | POA: Diagnosis not present

## 2014-11-04 DIAGNOSIS — I6523 Occlusion and stenosis of bilateral carotid arteries: Secondary | ICD-10-CM

## 2014-11-04 NOTE — Assessment & Plan Note (Signed)
Pagets disease of the left breast: previously treated with Aromasin and Arimidex. Because of his lack of clinical benefit, patient discontinued treatment as of January 2016. The nipple area on the left breast had increased tenderness and crusting that finally broke down and it healed back covering that area.  Breast Exam: No evidence of crusting but nipple mostly replaced by pagets disease. I donot feel it is any worse than before. Patient thinksits a little better than before.  Recommendation: 1. Watchful monitoring without antiestrogen therapy 2. Continue annual mammograms if recommended by Dr. Laurann Montana 3. Breast exams every 6 months will be done by [place].  We will see her back in 6 months.

## 2014-11-04 NOTE — Progress Notes (Signed)
Patient Care Team: Lavone Orn, MD as PCP - General (Internal Medicine) Nicholas Lose, MD as Consulting Physician (Hematology and Oncology)  DIAGNOSIS: Paget's disease left breast.  CHIEF COMPLIANT: follow-up of Pagets disease  INTERVAL HISTORY: Katherine [name] is a 81 year old with above-mentioned history of Paget's disease of the left nipple. She is here for a routine six-month follow-up. We stopped antiestrogen therapy as of January 2016. She has not had any noticeable improvement with antiestrogen therapy. I explained to the patient Paget's disease tends to not B estrogen requiring disease. And hence antiestrogen therapies do not have any major impact. I recommended watchful monitoring as opposed to antiestrogen treatment.  REVIEW OF SYSTEMS:   Constitutional: Denies fevers, chills or abnormal weight loss Eyes: Denies blurriness of vision Ears, nose, mouth, throat, and face: Denies mucositis or sore throat Respiratory: Denies cough, dyspnea or wheezes Cardiovascular: Denies palpitation, chest discomfort or lower extremity swelling Gastrointestinal:  Denies nausea, heartburn or change in bowel habits Skin: Denies abnormal skin rashes Lymphatics: Denies new lymphadenopathy or easy bruising Neurological:Denies numbness, tingling or new weaknesses Behavioral/Psych: Mood is stable, no new changes  Breast: left nipple replacement Paget's disease, no different from before. All other systems were reviewed with the patient and are negative.  I have reviewed the past medical history, past surgical history, social history and family history with the patient and they are unchanged from previous note.  ALLERGIES:  is allergic to sulfa antibiotics; amoxicillin; and penicillins.  MEDICATIONS:  Current Outpatient Prescriptions  Medication Sig Dispense Refill  . amLODipine (NORVASC) 5 MG tablet Take 5 mg by mouth daily.    Marland Kitchen aspirin EC 81 MG tablet Take 81 mg by mouth daily.    Marland Kitchen azelastine  (ASTELIN) 137 MCG/SPRAY nasal spray Place 1 spray into the nose 2 (two) times daily. Use in each nostril as directed    . Calcium Carbonate-Vitamin D (CALTRATE 600+D PO) Take 1 tablet by mouth 2 (two) times daily.    Marland Kitchen esomeprazole (NEXIUM) 40 MG capsule Take 40 mg by mouth daily before breakfast.    . fexofenadine (ALLEGRA) 180 MG tablet Take 180 mg by mouth daily.    . fluticasone (FLONASE) 50 MCG/ACT nasal spray Place 2 sprays into the nose daily.    . Fluticasone-Salmeterol (ADVAIR) 250-50 MCG/DOSE AEPB Inhale 1 puff into the lungs every 12 (twelve) hours.    Marland Kitchen levothyroxine (SYNTHROID, LEVOTHROID) 88 MCG tablet Take 1 tablet (88 mcg total) by mouth daily before breakfast. 30 tablet 11  . Multiple Vitamins-Minerals (CENTRUM SILVER PO) Take 1 tablet by mouth daily.    . Multiple Vitamins-Minerals (ICAPS MV) TABS Take 1 tablet by mouth daily.    Marland Kitchen tiotropium (SPIRIVA) 18 MCG inhalation capsule Place 18 mcg into inhaler and inhale daily.    . valsartan-hydrochlorothiazide (DIOVAN-HCT) 160-12.5 MG per tablet Take 1 tablet by mouth daily.    . Vitamin D, Ergocalciferol, (DRISDOL) 50000 UNITS CAPS Take 50,000 Units by mouth every 30 (thirty) days.     No current facility-administered medications for this visit.    PHYSICAL EXAMINATION: ECOG PERFORMANCE STATUS: 1 - Symptomatic but completely ambulatory  Filed Vitals:   11/04/14 1324  BP: 155/51  Pulse: 78  Temp: 97.5 F (36.4 C)  Resp: 18   Filed Weights   11/04/14 1324  Weight: 115 lb 8 oz (52.39 kg)    GENERAL:alert, no distress and comfortable SKIN: skin color, texture, turgor are normal, no rashes or significant lesions EYES: normal, Conjunctiva are pink and non-injected,  sclera clear OROPHARYNX:no exudate, no erythema and lips, buccal mucosa, and tongue normal  NECK: supple, thyroid normal size, non-tender, without nodularity LYMPH:  no palpable lymphadenopathy in the cervical, axillary or inguinal LUNGS: clear to auscultation  and percussion with normal breathing effort HEART: regular rate & rhythm and no murmurs and no lower extremity edema ABDOMEN:abdomen soft, non-tender and normal bowel sounds Musculoskeletal:no cyanosis of digits and no clubbing  NEURO: alert & oriented x 3 with fluent speech, no focal motor/sensory deficits BREAST:left nipple replacement Paget's disease unchanged from before. (exam performed in the presence of a chaperone)  LABORATORY DATA:  I have reviewed the data as listed   Chemistry      Component Value Date/Time   NA 138 03/21/2014 1249   NA 136 03/19/2012 0422   K 4.3 03/21/2014 1249   K 4.2 03/19/2012 0422   CL 102 03/19/2012 0422   CO2 28 03/21/2014 1249   CO2 28 03/19/2012 0422   BUN 14.9 03/21/2014 1249   BUN 25* 03/19/2012 0422   CREATININE 1.1 03/21/2014 1249   CREATININE 0.97 03/19/2012 0422      Component Value Date/Time   CALCIUM 9.0 03/21/2014 1249   CALCIUM 8.4 03/19/2012 0422   ALKPHOS 85 03/21/2014 1249   ALKPHOS 54 03/16/2012 1100   AST 13 03/21/2014 1249   AST 21 03/16/2012 1100   ALT 9 03/21/2014 1249   ALT 14 03/16/2012 1100   BILITOT 0.28 03/21/2014 1249   BILITOT 0.5 03/16/2012 1100       Lab Results  Component Value Date   WBC 9.5 03/21/2014   HGB 11.0* 03/21/2014   HCT 33.0* 03/21/2014   MCV 91.2 03/21/2014   PLT 483* 03/21/2014   NEUTROABS 6.1 03/21/2014   ASSESSMENT & PLAN:  Paget's disease and intraductal carcinoma of left breast Pagets disease of the left breast: previously treated with Aromasin and Arimidex. Because of his lack of clinical benefit, patient discontinued treatment as of January 2016. The nipple area on the left breast had increased tenderness and crusting that finally broke down and it healed back covering that area.  Breast Exam: No evidence of crusting but nipple mostly replaced by pagets disease. I donot feel it is any worse than before. Patient thinksits a little better than before.  Recommendation: 1. Watchful  monitoring without antiestrogen therapy 2. Continue annual mammograms if recommended by Dr. Laurann Montana 3. Breast exams every 6 months will be done by [place].  We will see her back in 6 months.   No orders of the defined types were placed in this encounter.   The patient has a good understanding of the overall plan. she agrees with it. she will call with any problems that may develop before the next visit here.   Rulon Eisenmenger, MD

## 2014-11-04 NOTE — Telephone Encounter (Signed)
Pt confirmed labs/ov per 09/19 POF, gave pt AVS and Calendar... KJ °

## 2014-11-14 DIAGNOSIS — Z23 Encounter for immunization: Secondary | ICD-10-CM | POA: Diagnosis not present

## 2014-11-19 ENCOUNTER — Ambulatory Visit (INDEPENDENT_AMBULATORY_CARE_PROVIDER_SITE_OTHER): Payer: Medicare Other | Admitting: Podiatry

## 2014-11-19 DIAGNOSIS — B351 Tinea unguium: Secondary | ICD-10-CM

## 2014-11-19 DIAGNOSIS — Q828 Other specified congenital malformations of skin: Secondary | ICD-10-CM

## 2014-11-19 DIAGNOSIS — M79673 Pain in unspecified foot: Secondary | ICD-10-CM | POA: Diagnosis not present

## 2014-11-19 NOTE — Progress Notes (Signed)
Subjective:     Patient ID: Katherine [name], female   DOB: 15-Apr-1917, 81 y.o.   MRN: [id number]  HPIThis patient presents to the office with painful nails and calluses both feet.  She says her nails are thick and long and are painful walking and wearing her shoes.  She also has callus under both feet as well as corns on toes both feet.  She presents for preventive foot care services.   Review of Systems     Objective:   Physical Exam GENERAL APPEARANCE: Alert, conversant. Appropriately groomed. No acute distress.  VASCULAR: Pedal pulses palpable at 2/4 DP and PT bilateral.  Capillary refill time is immediate to all digits,  Proximal to distal cooling it warm to warm.  Digital hair growth is present bilateral  NEUROLOGIC: sensation is intact epicritically and protectively to 5.07 monofilament at 5/5 sites bilateral.  Light touch is intact bilateral, vibratory sensation intact bilateral, achilles tendon reflex is intact bilateral.  MUSCULOSKELETAL: acceptable muscle strength, tone and stability bilateral.  Intrinsic muscluature intact bilateral.  Rectus appearance of foot and digits noted bilateral.   DERMATOLOGIC: skin color, texture, and turgor are within normal limits.  No preulcerative lesions or ulcers  are seen, no interdigital maceration noted.  No open lesions present.  . No drainage noted.   Porokeratosis sub 2,4 right foot callus sub3rd right foot.  NAILS  Thick disfigured discolored nails both feet.      Assessment:     Onychomycosis B/L  Porokeratosis right foot     Plan:     Debridement of Onychomycosis   Debridement of Porokeratosis.  RTC 9 weeks.

## 2014-11-28 DIAGNOSIS — C50012 Malignant neoplasm of nipple and areola, left female breast: Secondary | ICD-10-CM | POA: Diagnosis not present

## 2014-11-28 DIAGNOSIS — Z85828 Personal history of other malignant neoplasm of skin: Secondary | ICD-10-CM | POA: Diagnosis not present

## 2014-11-28 DIAGNOSIS — L570 Actinic keratosis: Secondary | ICD-10-CM | POA: Diagnosis not present

## 2014-11-28 DIAGNOSIS — L720 Epidermal cyst: Secondary | ICD-10-CM | POA: Diagnosis not present

## 2014-11-28 DIAGNOSIS — L821 Other seborrheic keratosis: Secondary | ICD-10-CM | POA: Diagnosis not present

## 2014-12-09 DIAGNOSIS — H353131 Nonexudative age-related macular degeneration, bilateral, early dry stage: Secondary | ICD-10-CM | POA: Diagnosis not present

## 2014-12-09 DIAGNOSIS — Z961 Presence of intraocular lens: Secondary | ICD-10-CM | POA: Diagnosis not present

## 2015-01-23 ENCOUNTER — Encounter: Payer: Self-pay | Admitting: Podiatry

## 2015-01-23 ENCOUNTER — Ambulatory Visit (INDEPENDENT_AMBULATORY_CARE_PROVIDER_SITE_OTHER): Payer: Medicare Other | Admitting: Podiatry

## 2015-01-23 DIAGNOSIS — Q828 Other specified congenital malformations of skin: Secondary | ICD-10-CM | POA: Diagnosis not present

## 2015-01-23 DIAGNOSIS — B351 Tinea unguium: Secondary | ICD-10-CM

## 2015-01-23 DIAGNOSIS — M79673 Pain in unspecified foot: Secondary | ICD-10-CM | POA: Diagnosis not present

## 2015-01-23 DIAGNOSIS — L84 Corns and callosities: Secondary | ICD-10-CM

## 2015-01-23 NOTE — Progress Notes (Signed)
Subjective:     Patient ID: Katherine [name], female   DOB: [birth year], 81 y.o.   MRN: [id number]  HPIThis patient presents to the office with painful nails and calluses both feet.  She says her nails are thick and long and are painful walking and wearing her shoes.  She also has callus under both feet as well as corns on toes both feet.  She presents for preventive foot care services.   Review of Systems     Objective:   Physical Exam GENERAL APPEARANCE: Alert, conversant. Appropriately groomed. No acute distress.  VASCULAR: Pedal pulses palpable at 2/4 DP and PT bilateral.  Capillary refill time is immediate to all digits,  Proximal to distal cooling it warm to warm.  Digital hair growth is present bilateral  NEUROLOGIC: sensation is intact epicritically and protectively to 5.07 monofilament at 5/5 sites bilateral.  Light touch is intact bilateral, vibratory sensation intact bilateral, achilles tendon reflex is intact bilateral.  MUSCULOSKELETAL: acceptable muscle strength, tone and stability bilateral.  Intrinsic muscluature intact bilateral.  Rectus appearance of foot and digits noted bilateral.   DERMATOLOGIC: skin color, texture, and turgor are within normal limits.  No preulcerative lesions or ulcers  are seen, no interdigital maceration noted.  No open lesions present.  . No drainage noted.   Porokeratosis sub 2,4 right foot callus sub3rd right foot.  Corn second toe right and third toe left.  NAILS  Thick disfigured discolored nails both feet.      Assessment:     Onychomycosis B/L  Porokeratosis right foot     Plan:     Debridement of Onychomycosis   Debridement of Porokeratosis.  RTC 9 weeks.  Gardiner Barefoot DPM

## 2015-02-28 ENCOUNTER — Telehealth: Payer: Self-pay | Admitting: *Deleted

## 2015-02-28 DIAGNOSIS — B351 Tinea unguium: Secondary | ICD-10-CM

## 2015-02-28 NOTE — Telephone Encounter (Signed)
Pt asked if the [place] office had callous pads.  I told pt probably, but I had never been there, but could have Dr. Burnell Blanks assistant call me with the supplies. I told pt we did not have the foam callous pads here only the felt ones. Pt states she'll come to the [place] office to pick up the pads today before 400pm.

## 2015-03-06 DIAGNOSIS — I129 Hypertensive chronic kidney disease with stage 1 through stage 4 chronic kidney disease, or unspecified chronic kidney disease: Secondary | ICD-10-CM | POA: Diagnosis not present

## 2015-03-06 DIAGNOSIS — J449 Chronic obstructive pulmonary disease, unspecified: Secondary | ICD-10-CM | POA: Diagnosis not present

## 2015-03-06 DIAGNOSIS — N183 Chronic kidney disease, stage 3 (moderate): Secondary | ICD-10-CM | POA: Diagnosis not present

## 2015-03-06 DIAGNOSIS — K219 Gastro-esophageal reflux disease without esophagitis: Secondary | ICD-10-CM | POA: Diagnosis not present

## 2015-03-27 ENCOUNTER — Ambulatory Visit: Payer: Medicare Other | Admitting: Podiatry

## 2015-04-09 ENCOUNTER — Encounter: Payer: Self-pay | Admitting: Podiatry

## 2015-04-09 ENCOUNTER — Ambulatory Visit (INDEPENDENT_AMBULATORY_CARE_PROVIDER_SITE_OTHER): Payer: Medicare Other | Admitting: Podiatry

## 2015-04-09 DIAGNOSIS — Q828 Other specified congenital malformations of skin: Secondary | ICD-10-CM

## 2015-04-09 DIAGNOSIS — M79676 Pain in unspecified toe(s): Secondary | ICD-10-CM

## 2015-04-09 DIAGNOSIS — B351 Tinea unguium: Secondary | ICD-10-CM

## 2015-04-09 NOTE — Progress Notes (Signed)
Subjective:     Patient ID: Katherine [name], female   DOB: 1917-07-27, 82 y.o.   MRN: [id number]  HPIThis patient presents to the office with painful nails and calluses both feet.  She says her nails are thick and long and are painful walking and wearing her shoes.  She also has callus under both feet as well as corns on toes both feet.  She presents for preventive foot care services.   Review of Systems     Objective:   Physical Exam GENERAL APPEARANCE: Alert, conversant. Appropriately groomed. No acute distress.  VASCULAR: Pedal pulses palpable at 2/4 DP and PT bilateral.  Capillary refill time is immediate to all digits,  Proximal to distal cooling it warm to warm.  Digital hair growth is present bilateral  NEUROLOGIC: sensation is intact epicritically and protectively to 5.07 monofilament at 5/5 sites bilateral.  Light touch is intact bilateral, vibratory sensation intact bilateral, achilles tendon reflex is intact bilateral.  MUSCULOSKELETAL: acceptable muscle strength, tone and stability bilateral.  Intrinsic muscluature intact bilateral.  Rectus appearance of foot and digits noted bilateral.   DERMATOLOGIC: skin color, texture, and turgor are within normal limits.  No preulcerative lesions or ulcers  are seen, no interdigital maceration noted.  No open lesions present.  . No drainage noted.   Porokeratosis sub 2,4 right foot callus sub3rd right foot.  Corn second toe right and third toe left.  NAILS  Thick disfigured discolored nails both feet.      Assessment:     Onychomycosis B/L  Porokeratosis right foot     Plan:     Debridement of Onychomycosis   Debridement of Porokeratosis.  RTC 9 weeks.  Gardiner Barefoot DPM

## 2015-05-04 NOTE — Assessment & Plan Note (Signed)
Pagets disease of the left breast: previously treated with Aromasin and Arimidex. Because of his lack of clinical benefit, patient discontinued treatment as of January 2016. The nipple area on the left breast had increased tenderness and crusting that finally broke down and it healed back covering that area.  Breast Exam: No evidence of crusting but nipple mostly replaced by pagets disease. I donot feel it is any worse than before. Patient thinksits a little better than before.  Recommendation: 1. Watchful monitoring without antiestrogen therapy 2. Continue annual mammograms if recommended by Dr. Laurann Montana 3. Breast exams annually will be done by [place].  We will see her back in 1 year.

## 2015-05-05 ENCOUNTER — Telehealth: Payer: Self-pay | Admitting: Hematology and Oncology

## 2015-05-05 ENCOUNTER — Ambulatory Visit (HOSPITAL_BASED_OUTPATIENT_CLINIC_OR_DEPARTMENT_OTHER): Payer: Medicare Other | Admitting: Hematology and Oncology

## 2015-05-05 ENCOUNTER — Encounter: Payer: Self-pay | Admitting: Hematology and Oncology

## 2015-05-05 VITALS — BP 143/50 | HR 81 | Temp 97.5°F | Resp 17 | Wt 110.8 lb

## 2015-05-05 DIAGNOSIS — C50012 Malignant neoplasm of nipple and areola, left female breast: Secondary | ICD-10-CM | POA: Diagnosis not present

## 2015-05-05 DIAGNOSIS — C50912 Malignant neoplasm of unspecified site of left female breast: Secondary | ICD-10-CM

## 2015-05-05 NOTE — Telephone Encounter (Signed)
appt made and avs printed °

## 2015-05-05 NOTE — Progress Notes (Signed)
Patient Care Team: Lavone Orn, MD as PCP - General (Internal Medicine) Nicholas Lose, MD as Consulting Physician (Hematology and Oncology)  DIAGNOSIS: Paget's disease left breast CHIEF COMPLIANT: no change in the skin lesion around the left nipple  INTERVAL HISTORY: Katherine [name] is a 82 year old with above-mentioned history of Paget's disease of the left breast was here for a checkup. She reports no change in the skin lesion around the left nipple. She denies any pain or discomfort. Denies any discharge. Denies any lumps or nodules. She is still functional with the help of oxygen and using a walker.  REVIEW OF SYSTEMS:   Constitutional: Denies fevers, chills or abnormal weight loss Eyes: Denies blurriness of vision Ears, nose, mouth, throat, and face: Denies mucositis or sore throat Respiratory: shortness of breath use oxygen Cardiovascular: Denies palpitation, chest discomfort Gastrointestinal:  Denies nausea, heartburn or change in bowel habits Skin: Denies abnormal skin rashes Lymphatics: Denies new lymphadenopathy or easy bruising Neurological:Denies numbness, tingling, uses a walker to get around Behavioral/Psych: Mood is stable, no new changes  Extremities: No lower extremity edema Breast: left breast Paget's disease unchanged from before All other systems were reviewed with the patient and are negative.  I have reviewed the past medical history, past surgical history, social history and family history with the patient and they are unchanged from previous note.  ALLERGIES:  is allergic to sulfa antibiotics; amoxicillin; and penicillins.  MEDICATIONS:  Current Outpatient Prescriptions  Medication Sig Dispense Refill  . amLODipine (NORVASC) 5 MG tablet Take 5 mg by mouth daily.    Marland Kitchen aspirin EC 81 MG tablet Take 81 mg by mouth daily.    Marland Kitchen azelastine (ASTELIN) 137 MCG/SPRAY nasal spray Place 1 spray into the nose 2 (two) times daily. Use in each nostril as directed    .  Calcium Carbonate-Vitamin D (CALTRATE 600+D PO) Take 1 tablet by mouth 2 (two) times daily.    Marland Kitchen esomeprazole (NEXIUM) 40 MG capsule Take 40 mg by mouth daily before breakfast.    . fexofenadine (ALLEGRA) 180 MG tablet Take 180 mg by mouth daily.    . fluticasone (FLONASE) 50 MCG/ACT nasal spray Place 2 sprays into the nose daily.    . Fluticasone-Salmeterol (ADVAIR) 250-50 MCG/DOSE AEPB Inhale 1 puff into the lungs every 12 (twelve) hours.    Marland Kitchen levothyroxine (SYNTHROID, LEVOTHROID) 88 MCG tablet Take 1 tablet (88 mcg total) by mouth daily before breakfast. 30 tablet 11  . Multiple Vitamins-Minerals (CENTRUM SILVER PO) Take 1 tablet by mouth daily.    . Multiple Vitamins-Minerals (ICAPS MV) TABS Take 1 tablet by mouth daily.    Marland Kitchen tiotropium (SPIRIVA) 18 MCG inhalation capsule Place 18 mcg into inhaler and inhale daily.    . valsartan-hydrochlorothiazide (DIOVAN-HCT) 160-12.5 MG per tablet Take 1 tablet by mouth daily.    . Vitamin D, Ergocalciferol, (DRISDOL) 50000 UNITS CAPS Take 50,000 Units by mouth every 30 (thirty) days.     No current facility-administered medications for this visit.    PHYSICAL EXAMINATION: ECOG PERFORMANCE STATUS: 1 - Symptomatic but completely ambulatory  Filed Vitals:   05/05/15 1359  BP: 143/50  Pulse: 81  Temp: 97.5 F (36.4 C)  Resp: 17   Filed Weights   05/05/15 1359  Weight: 110 lb 12.8 oz (50.259 kg)    GENERAL:alert, no distress and comfortable SKIN: skin color, texture, turgor are normal, no rashes or significant lesions EYES: normal, Conjunctiva are pink and non-injected, sclera clear OROPHARYNX:no exudate, no erythema and  lips, buccal mucosa, and tongue normal  NECK: supple, thyroid normal size, non-tender, without nodularity LYMPH:  no palpable lymphadenopathy in the cervical, axillary or inguinal LUNGS: clear to auscultation and percussion with normal breathing effort HEART: regular rate & rhythm and no murmurs and no lower extremity  edema ABDOMEN:abdomen soft, non-tender and normal bowel sounds MUSCULOSKELETAL:no cyanosis of digits and no clubbing  NEURO: alert & oriented x 3 with fluent speech, no focal motor/sensory deficits EXTREMITIES: No lower extremity edema BREAST: No palpable masses or nodules in either right or left breasts. Left nipple Paget's disease is unchanged from before No palpable axillary supraclavicular or infraclavicular adenopathy no breast tenderness or nipple discharge. (exam performed in the presence of a chaperone)  LABORATORY DATA:  I have reviewed the data as listed   Chemistry      Component Value Date/Time   NA 138 03/21/2014 1249   NA 136 03/19/2012 0422   K 4.3 03/21/2014 1249   K 4.2 03/19/2012 0422   CL 102 03/19/2012 0422   CO2 28 03/21/2014 1249   CO2 28 03/19/2012 0422   BUN 14.9 03/21/2014 1249   BUN 25* 03/19/2012 0422   CREATININE 1.1 03/21/2014 1249   CREATININE 0.97 03/19/2012 0422      Component Value Date/Time   CALCIUM 9.0 03/21/2014 1249   CALCIUM 8.4 03/19/2012 0422   ALKPHOS 85 03/21/2014 1249   ALKPHOS 54 03/16/2012 1100   AST 13 03/21/2014 1249   AST 21 03/16/2012 1100   ALT 9 03/21/2014 1249   ALT 14 03/16/2012 1100   BILITOT 0.28 03/21/2014 1249   BILITOT 0.5 03/16/2012 1100       Lab Results  Component Value Date   WBC 9.5 03/21/2014   HGB 11.0* 03/21/2014   HCT 33.0* 03/21/2014   MCV 91.2 03/21/2014   PLT 483* 03/21/2014   NEUTROABS 6.1 03/21/2014     ASSESSMENT & PLAN:  Paget's disease and intraductal carcinoma of left breast Pagets disease of the left breast: previously treated with Aromasin and Arimidex. Because of his lack of clinical benefit, patient discontinued treatment as of January 2016. The nipple area on the left breast had increased tenderness and crusting that finally broke down and it healed back covering that area.  Breast Exam: No evidence of crusting but nipple mostly replaced by pagets disease. I donot feel it is any  worse than before. Patient thinksits a little better than before.  Recommendation: 1. Watchful monitoring without antiestrogen therapy 2. Continue annual mammograms if recommended by Dr. Laurann Montana 3. Breast exams annually will be done by [place] if she wishes to follow-up with [place]. If it becomes too burdensome to come to the [facility], she may choose to not follow-up in the breast clinic.  We will see her back in 1 year.   No orders of the defined types were placed in this encounter.   The patient has a good understanding of the overall plan. she agrees with it. she will call with any problems that may develop before the next visit here.   Rulon Eisenmenger, MD 05/05/2015

## 2015-05-29 DIAGNOSIS — R350 Frequency of micturition: Secondary | ICD-10-CM | POA: Diagnosis not present

## 2015-06-10 DIAGNOSIS — R350 Frequency of micturition: Secondary | ICD-10-CM | POA: Diagnosis not present

## 2015-06-18 ENCOUNTER — Ambulatory Visit (INDEPENDENT_AMBULATORY_CARE_PROVIDER_SITE_OTHER): Payer: Medicare Other | Admitting: Podiatry

## 2015-06-18 ENCOUNTER — Encounter: Payer: Self-pay | Admitting: Podiatry

## 2015-06-18 DIAGNOSIS — M79676 Pain in unspecified toe(s): Secondary | ICD-10-CM | POA: Diagnosis not present

## 2015-06-18 DIAGNOSIS — B351 Tinea unguium: Secondary | ICD-10-CM

## 2015-06-18 DIAGNOSIS — Q828 Other specified congenital malformations of skin: Secondary | ICD-10-CM | POA: Diagnosis not present

## 2015-06-18 NOTE — Progress Notes (Signed)
Subjective:     Patient ID: Katherine [name], female   DOB: Jan 21, 1918, 82 y.o.   MRN: [id number]  HPIThis patient presents to the office with painful nails and calluses both feet.  She says her nails are thick and long and are painful walking and wearing her shoes.  She also has callus under both feet as well as corns on toes both feet.  She presents for preventive foot care services.   Review of Systems     Objective:   Physical Exam GENERAL APPEARANCE: Alert, conversant. Appropriately groomed. No acute distress.  VASCULAR: Pedal pulses palpable at 2/4 DP and PT bilateral.  Capillary refill time is immediate to all digits,  Proximal to distal cooling it warm to warm.  Digital hair growth is present bilateral  NEUROLOGIC: sensation is intact epicritically and protectively to 5.07 monofilament at 5/5 sites bilateral.  Light touch is intact bilateral, vibratory sensation intact bilateral, achilles tendon reflex is intact bilateral.  MUSCULOSKELETAL: acceptable muscle strength, tone and stability bilateral.  Intrinsic muscluature intact bilateral.  Rectus appearance of foot and digits noted bilateral.   DERMATOLOGIC: skin color, texture, and turgor are within normal limits.  No preulcerative lesions or ulcers  are seen, no interdigital maceration noted.  No open lesions present.  . No drainage noted.   Porokeratosis sub 2,4 right foot callus sub3rd right foot.  Corn second toe right and third toe left.  NAILS  Thick disfigured discolored nails both feet.      Assessment:     Onychomycosis B/L  Porokeratosis right foot     Plan:     Debridement of Onychomycosis   Debridement of Porokeratosis.  RTC 9 weeks.  Gardiner Barefoot DPM

## 2015-07-10 DIAGNOSIS — S81819A Laceration without foreign body, unspecified lower leg, initial encounter: Secondary | ICD-10-CM | POA: Diagnosis not present

## 2015-08-20 ENCOUNTER — Ambulatory Visit (INDEPENDENT_AMBULATORY_CARE_PROVIDER_SITE_OTHER): Payer: Medicare Other | Admitting: Podiatry

## 2015-08-20 DIAGNOSIS — L84 Corns and callosities: Secondary | ICD-10-CM

## 2015-08-20 DIAGNOSIS — B351 Tinea unguium: Secondary | ICD-10-CM | POA: Diagnosis not present

## 2015-08-20 DIAGNOSIS — M79676 Pain in unspecified toe(s): Secondary | ICD-10-CM

## 2015-08-20 DIAGNOSIS — Q828 Other specified congenital malformations of skin: Secondary | ICD-10-CM

## 2015-08-20 NOTE — Progress Notes (Signed)
Subjective:     Patient ID: Katherine [name], female   DOB: 05-08-1917, 82 y.o.   MRN: [id number]  HPIThis patient presents to the office with painful nails and calluses both feet.  She says her nails are thick and long and are painful walking and wearing her shoes.  She also has callus under both feet as well as corns on toes both feet.  She presents for preventive foot care services.   Review of Systems     Objective:   Physical Exam GENERAL APPEARANCE: Alert, conversant. Appropriately groomed. No acute distress.  VASCULAR: Pedal pulses palpable at 2/4 DP and PT bilateral.  Capillary refill time is immediate to all digits,  Proximal to distal cooling it warm to warm.  Digital hair growth is present bilateral  NEUROLOGIC: sensation is intact epicritically and protectively to 5.07 monofilament at 5/5 sites bilateral.  Light touch is intact bilateral, vibratory sensation intact bilateral, achilles tendon reflex is intact bilateral.  MUSCULOSKELETAL: acceptable muscle strength, tone and stability bilateral.  Intrinsic muscluature intact bilateral.  Rectus appearance of foot and digits noted bilateral.   DERMATOLOGIC: skin color, texture, and turgor are within normal limits.  No preulcerative lesions or ulcers  are seen, no interdigital maceration noted.  No open lesions present.  . No drainage noted.   Porokeratosis sub 2,4 right foot callus sub3rd right foot.  Corn second toe right and third toe left.  NAILS  Thick disfigured discolored nails both feet.      Assessment:     Onychomycosis B/L  Porokeratosis right foot     Plan:     Debridement of Onychomycosis   Debridement of Porokeratosis.  RTC 9 weeks.  Gardiner Barefoot DPM

## 2015-09-04 DIAGNOSIS — M179 Osteoarthritis of knee, unspecified: Secondary | ICD-10-CM | POA: Diagnosis not present

## 2015-09-04 DIAGNOSIS — Z23 Encounter for immunization: Secondary | ICD-10-CM | POA: Diagnosis not present

## 2015-09-04 DIAGNOSIS — R739 Hyperglycemia, unspecified: Secondary | ICD-10-CM | POA: Diagnosis not present

## 2015-09-04 DIAGNOSIS — I129 Hypertensive chronic kidney disease with stage 1 through stage 4 chronic kidney disease, or unspecified chronic kidney disease: Secondary | ICD-10-CM | POA: Diagnosis not present

## 2015-09-04 DIAGNOSIS — Z1389 Encounter for screening for other disorder: Secondary | ICD-10-CM | POA: Diagnosis not present

## 2015-09-04 DIAGNOSIS — J449 Chronic obstructive pulmonary disease, unspecified: Secondary | ICD-10-CM | POA: Diagnosis not present

## 2015-09-04 DIAGNOSIS — E039 Hypothyroidism, unspecified: Secondary | ICD-10-CM | POA: Diagnosis not present

## 2015-09-04 DIAGNOSIS — K219 Gastro-esophageal reflux disease without esophagitis: Secondary | ICD-10-CM | POA: Diagnosis not present

## 2015-09-04 DIAGNOSIS — N183 Chronic kidney disease, stage 3 (moderate): Secondary | ICD-10-CM | POA: Diagnosis not present

## 2015-10-23 ENCOUNTER — Encounter: Payer: Self-pay | Admitting: Podiatry

## 2015-10-23 ENCOUNTER — Ambulatory Visit (INDEPENDENT_AMBULATORY_CARE_PROVIDER_SITE_OTHER): Payer: Medicare Other | Admitting: Podiatry

## 2015-10-23 DIAGNOSIS — B351 Tinea unguium: Secondary | ICD-10-CM

## 2015-10-23 DIAGNOSIS — Q828 Other specified congenital malformations of skin: Secondary | ICD-10-CM

## 2015-10-23 DIAGNOSIS — M79676 Pain in unspecified toe(s): Secondary | ICD-10-CM

## 2015-10-23 NOTE — Progress Notes (Signed)
Subjective:     Patient ID: Katherine [name], female   DOB: April 19, 1917, 82 y.o.   MRN: [id number]  HPIThis patient presents to the office with painful nails and calluses both feet.  She says her nails are thick and long and are painful walking and wearing her shoes.  She also has callus under both feet as well as corns on toes both feet.  She presents for preventive foot care services.   Review of Systems     Objective:   Physical Exam GENERAL APPEARANCE: Alert, conversant. Appropriately groomed. No acute distress.  VASCULAR: Pedal pulses palpable at 2/4 DP and PT bilateral.  Capillary refill time is immediate to all digits,  Proximal to distal cooling it warm to warm.  Digital hair growth is present bilateral  NEUROLOGIC: sensation is intact epicritically and protectively to 5.07 monofilament at 5/5 sites bilateral.  Light touch is intact bilateral, vibratory sensation intact bilateral, achilles tendon reflex is intact bilateral.  MUSCULOSKELETAL: acceptable muscle strength, tone and stability bilateral.  Intrinsic muscluature intact bilateral.  Rectus appearance of foot and digits noted bilateral.   DERMATOLOGIC: skin color, texture, and turgor are within normal limits.  No preulcerative lesions or ulcers  are seen, no interdigital maceration noted.  No open lesions present.  . No drainage noted.   Porokeratosis sub 2,4 right foot callus sub3rd right foot.  Corn second toe right and third toe left.  NAILS  Thick disfigured discolored nails both feet.      Assessment:     Onychomycosis B/L  Porokeratosis right foot     Plan:     Debridement of Onychomycosis   Debridement of Porokeratosis.  RTC 9 weeks.  Gardiner Barefoot DPM

## 2015-11-27 DIAGNOSIS — Z23 Encounter for immunization: Secondary | ICD-10-CM | POA: Diagnosis not present

## 2015-12-04 DIAGNOSIS — C50012 Malignant neoplasm of nipple and areola, left female breast: Secondary | ICD-10-CM | POA: Diagnosis not present

## 2015-12-04 DIAGNOSIS — L570 Actinic keratosis: Secondary | ICD-10-CM | POA: Diagnosis not present

## 2015-12-04 DIAGNOSIS — Z85828 Personal history of other malignant neoplasm of skin: Secondary | ICD-10-CM | POA: Diagnosis not present

## 2015-12-04 DIAGNOSIS — D692 Other nonthrombocytopenic purpura: Secondary | ICD-10-CM | POA: Diagnosis not present

## 2015-12-04 DIAGNOSIS — L821 Other seborrheic keratosis: Secondary | ICD-10-CM | POA: Diagnosis not present

## 2015-12-15 ENCOUNTER — Telehealth: Payer: Self-pay | Admitting: *Deleted

## 2015-12-15 NOTE — Telephone Encounter (Signed)
Called pt back to discuss after reviewing with Dr. Lindi Adie.  Per Dr. Lindi Adie, nipple discoloration is consistent with Paget's disease and thus does not necessitate being seen any sooner than next follow up appt as long as it does not bother her.  Pt denies any redness, swelling, discharge, skin breakdown, etc. But does verbalize understanding that she will notify us if any of these present.  Pt without further questions or concerns at time of call.

## 2015-12-15 NOTE — Telephone Encounter (Signed)
Pt called requesting a call back from nurse.   Spoke with pt and was informed that pt had noticed a change in color of her Left breast nipple - dark pink color.  Denied pain, denied swelling, denied any discharge or draining, denied any odor.   Pt noticed this change several weeks ago.   Pt wanted to know if Dr. Lindi Adie would want to see pt sooner than March 2018. Pt's    Phone     [phone].

## 2015-12-25 ENCOUNTER — Ambulatory Visit (INDEPENDENT_AMBULATORY_CARE_PROVIDER_SITE_OTHER): Payer: Medicare Other | Admitting: Podiatry

## 2015-12-25 ENCOUNTER — Encounter: Payer: Self-pay | Admitting: Podiatry

## 2015-12-25 VITALS — Ht <= 58 in | Wt 110.0 lb

## 2015-12-25 DIAGNOSIS — B351 Tinea unguium: Secondary | ICD-10-CM

## 2015-12-25 DIAGNOSIS — Q828 Other specified congenital malformations of skin: Secondary | ICD-10-CM | POA: Diagnosis not present

## 2015-12-25 DIAGNOSIS — M79676 Pain in unspecified toe(s): Secondary | ICD-10-CM

## 2015-12-25 NOTE — Progress Notes (Signed)
Subjective:     Patient ID: Katherine [name], female   DOB: [birth year], 82 y.o.   MRN: [id number]  HPIThis patient presents to the office with painful nails and calluses both feet.  She says her nails are thick and long and are painful walking and wearing her shoes.  She also has callus under both feet as well as corns on toes both feet.  She presents for preventive foot care services.   Review of Systems     Objective:   Physical Exam GENERAL APPEARANCE: Alert, conversant. Appropriately groomed. No acute distress.  VASCULAR: Pedal pulses palpable at 2/4 DP and PT bilateral.  Capillary refill time is immediate to all digits,  Proximal to distal cooling it warm to warm.  Digital hair growth is present bilateral  NEUROLOGIC: sensation is intact epicritically and protectively to 5.07 monofilament at 5/5 sites bilateral.  Light touch is intact bilateral, vibratory sensation intact bilateral, achilles tendon reflex is intact bilateral.  MUSCULOSKELETAL: acceptable muscle strength, tone and stability bilateral.  Intrinsic muscluature intact bilateral.  Rectus appearance of foot and digits noted bilateral.   DERMATOLOGIC: skin color, texture, and turgor are within normal limits.  No preulcerative lesions or ulcers  are seen, no interdigital maceration noted.  No open lesions present.  . No drainage noted.   Porokeratosis sub 2,4 right foot callus sub3rd right foot.  Corn second toe right and third toe left.  NAILS  Thick disfigured discolored nails both feet.      Assessment:     Onychomycosis B/L  Porokeratosis right foot     Plan:     Debridement of Onychomycosis   Debridement of Porokeratosis.  RTC 9 weeks.  Gardiner Barefoot DPM

## 2016-02-26 ENCOUNTER — Encounter: Payer: Self-pay | Admitting: Podiatry

## 2016-02-26 ENCOUNTER — Ambulatory Visit (INDEPENDENT_AMBULATORY_CARE_PROVIDER_SITE_OTHER): Payer: Medicare Other | Admitting: Podiatry

## 2016-02-26 VITALS — Ht <= 58 in | Wt 110.0 lb

## 2016-02-26 DIAGNOSIS — B351 Tinea unguium: Secondary | ICD-10-CM

## 2016-02-26 DIAGNOSIS — Q828 Other specified congenital malformations of skin: Secondary | ICD-10-CM | POA: Diagnosis not present

## 2016-02-26 DIAGNOSIS — M79676 Pain in unspecified toe(s): Secondary | ICD-10-CM | POA: Diagnosis not present

## 2016-02-26 NOTE — Progress Notes (Signed)
Subjective:     Patient ID: Katherine [name], female   DOB: [birth year], 83 y.o.   MRN: [id number]  HPIThis patient presents to the office with painful nails and calluses both feet.  She says her nails are thick and long and are painful walking and wearing her shoes.  She also has callus under both feet as well as corns on toes both feet.  She presents for preventive foot care services.   Review of Systems     Objective:   Physical Exam GENERAL APPEARANCE: Alert, conversant. Appropriately groomed. No acute distress.  VASCULAR: Pedal pulses palpable at 2/4 DP and PT bilateral.  Capillary refill time is immediate to all digits,  Proximal to distal cooling it warm to warm.  Digital hair growth is present bilateral  NEUROLOGIC: sensation is intact epicritically and protectively to 5.07 monofilament at 5/5 sites bilateral.  Light touch is intact bilateral, vibratory sensation intact bilateral, achilles tendon reflex is intact bilateral.  MUSCULOSKELETAL: acceptable muscle strength, tone and stability bilateral.  Intrinsic muscluature intact bilateral.  Rectus appearance of foot and digits noted bilateral.   DERMATOLOGIC: skin color, texture, and turgor are within normal limits.  No preulcerative lesions or ulcers  are seen, no interdigital maceration noted.  No open lesions present.  . No drainage noted.   Porokeratosis sub 2,4 right foot callus sub3rd right foot.  Corn second toe right and third toe left.  NAILS  Thick disfigured discolored nails both feet.      Assessment:     Onychomycosis B/L  Porokeratosis right foot     Plan:     Debridement of Onychomycosis   Debridement of Porokeratosis.  RTC 9 weeks.  Gardiner Barefoot DPM

## 2016-03-02 ENCOUNTER — Encounter: Payer: Self-pay | Admitting: Family

## 2016-03-04 ENCOUNTER — Encounter (HOSPITAL_COMMUNITY): Payer: Medicare Other

## 2016-03-04 ENCOUNTER — Ambulatory Visit: Payer: Medicare Other | Admitting: Family

## 2016-03-08 ENCOUNTER — Ambulatory Visit (HOSPITAL_COMMUNITY)
Admission: RE | Admit: 2016-03-08 | Discharge: 2016-03-08 | Disposition: A | Discharge status: Home / Self Care | Payer: Medicare Other | Source: Ambulatory Visit | Attending: Family | Admitting: Family

## 2016-03-08 ENCOUNTER — Ambulatory Visit: Payer: Medicare Other | Admitting: Family

## 2016-03-08 DIAGNOSIS — Z48812 Encounter for surgical aftercare following surgery on the circulatory system: Secondary | ICD-10-CM

## 2016-03-09 DIAGNOSIS — I10 Essential (primary) hypertension: Secondary | ICD-10-CM | POA: Diagnosis not present

## 2016-03-09 DIAGNOSIS — J449 Chronic obstructive pulmonary disease, unspecified: Secondary | ICD-10-CM | POA: Diagnosis not present

## 2016-03-09 DIAGNOSIS — I872 Venous insufficiency (chronic) (peripheral): Secondary | ICD-10-CM | POA: Diagnosis not present

## 2016-03-31 ENCOUNTER — Ambulatory Visit (INDEPENDENT_AMBULATORY_CARE_PROVIDER_SITE_OTHER): Payer: Medicare Other | Admitting: Podiatry

## 2016-03-31 ENCOUNTER — Encounter: Payer: Self-pay | Admitting: Podiatry

## 2016-03-31 DIAGNOSIS — L84 Corns and callosities: Secondary | ICD-10-CM

## 2016-03-31 NOTE — Progress Notes (Signed)
Subjective:     Patient ID: Katherine [name], female   DOB: 12-17-1917, 83 y.o.   MRN: [id number]  HPIThis patient presents to the office with painful nails and calluses both feet.  She says she hasn't especially painful third toe on the left foot, which has developed a new corn concerned that the neck the toenail and toe may be infected. He has been utilizing lams wall between her toes to help separate the toes to prevent corns resents the office today for treatment of this painful corn on the third toe, left foot  Review of Systems     Objective:   Physical Exam GENERAL APPEARANCE: Alert, conversant. Appropriately groomed. No acute distress.  VASCULAR: Pedal pulses palpable at 2/4 DP and PT bilateral.  Capillary refill time is immediate to all digits,  Proximal to distal cooling it warm to warm.   NEUROLOGIC: sensation is intact epicritically and protectively to 5.07 monofilament at 5/5 sites bilateral.  Light touch is intact bilateral,   MUSCULOSKELETAL: acceptable muscle strength, tone and stability bilateral.  Severe HAV  B/L with hammer toes  B/L  DERMATOLOGIC: skin color, texture, and turgor are within normal limits.  No preulcerative lesions or ulcers  are seen, no interdigital maceration noted.  No open lesions present.  . No drainage noted.  SKIN  Corn third toe right foot.  NAILS  Thick disfigured discolored nails both feet.      Assessment:      Corn 3rd toe left foot Plan:     Debridenment of painful corn.  Reapply lambs wool left foot. RTC 9 weeks.  Gardiner Barefoot DPM

## 2016-04-16 ENCOUNTER — Encounter: Payer: Self-pay | Admitting: Family

## 2016-04-20 ENCOUNTER — Other Ambulatory Visit: Payer: Self-pay

## 2016-04-20 DIAGNOSIS — I6529 Occlusion and stenosis of unspecified carotid artery: Secondary | ICD-10-CM

## 2016-04-26 ENCOUNTER — Encounter (HOSPITAL_COMMUNITY): Payer: Medicare Other

## 2016-04-26 ENCOUNTER — Ambulatory Visit: Payer: Medicare Other

## 2016-05-04 ENCOUNTER — Ambulatory Visit (HOSPITAL_BASED_OUTPATIENT_CLINIC_OR_DEPARTMENT_OTHER): Payer: Medicare Other | Admitting: Hematology and Oncology

## 2016-05-04 ENCOUNTER — Encounter: Payer: Self-pay | Admitting: Hematology and Oncology

## 2016-05-04 DIAGNOSIS — Z853 Personal history of malignant neoplasm of breast: Secondary | ICD-10-CM

## 2016-05-04 DIAGNOSIS — I6523 Occlusion and stenosis of bilateral carotid arteries: Secondary | ICD-10-CM

## 2016-05-04 DIAGNOSIS — C50912 Malignant neoplasm of unspecified site of left female breast: Secondary | ICD-10-CM

## 2016-05-04 NOTE — Assessment & Plan Note (Signed)
Pagets disease of the left breast: previously treated with Aromasin and Arimidex. Because of his lack of clinical benefit, patient discontinued treatment as of January 2016. The nipple area on the left breast had increased tenderness and crusting that finally broke down and it healed back covering that area.  Breast Exam: No evidence of crusting but nipple mostly replaced by pagets disease. I donot feel it is any worse than before. Patient thinks its a little better than before.  Recommendation: 1. Watchful monitoring without antiestrogen therapy 2. Continue annual mammograms if recommended by Dr. Laurann [place]  Return to clinic in 1 year for follow-up

## 2016-05-04 NOTE — Progress Notes (Signed)
Patient Care Team: Lavone Orn, MD as PCP - General (Internal Medicine) Nicholas Lose, MD as Consulting Physician (Hematology and Oncology)  DIAGNOSIS:  Encounter Diagnosis  Name Primary?  . Paget's disease and intraductal carcinoma of left breast ([place])    CHIEF COMPLIANT: Follow-up of Paget's disease of the breast  INTERVAL HISTORY: Katherine [name] is a 83 year old with above-mentioned history of Paget's disease of the breast who is here for annual follow-up. She tells me that lately the skin around the left nipple has been more sensitive than usual. She has noticed some discharge from it once or twice. Otherwise denies any pain or discomfort.  REVIEW OF SYSTEMS:   Constitutional: Denies fevers, chills or abnormal weight loss Eyes: Denies blurriness of vision Ears, nose, mouth, throat, and face: Denies mucositis or sore throat Respiratory: Denies cough, dyspnea or wheezes Cardiovascular: Denies palpitation, chest discomfort Gastrointestinal:  Denies nausea, heartburn or change in bowel habits Skin: Denies abnormal skin rashes Lymphatics: Denies new lymphadenopathy or easy bruising Neurological:Denies numbness, tingling or new weaknesses Behavioral/Psych: Mood is stable, no new changes  Extremities: No lower extremity edema Breast: Sensitivity on the left nipple from Paget's disease All other systems were reviewed with the patient and are negative.  I have reviewed the past medical history, past surgical history, social history and family history with the patient and they are unchanged from previous note.  ALLERGIES:  is allergic to sulfa antibiotics; amoxicillin; and penicillins.  MEDICATIONS:  Current Outpatient Prescriptions  Medication Sig Dispense Refill  . amLODipine (NORVASC) 5 MG tablet Take 5 mg by mouth daily.    Marland Kitchen aspirin EC 81 MG tablet Take 81 mg by mouth daily.    Marland Kitchen azelastine (ASTELIN) 137 MCG/SPRAY nasal spray Place 1 spray into the nose 2 (two) times daily. Use  in each nostril as directed    . Calcium Carbonate-Vitamin D (CALTRATE 600+D PO) Take 1 tablet by mouth 2 (two) times daily.    . ciprofloxacin (CIPRO) 250 MG tablet     . esomeprazole (NEXIUM) 40 MG capsule Take 40 mg by mouth daily before breakfast.    . fexofenadine (ALLEGRA) 180 MG tablet Take 180 mg by mouth daily.    . fluticasone (FLONASE) 50 MCG/ACT nasal spray Place 2 sprays into the nose daily.    . Fluticasone-Salmeterol (ADVAIR) 250-50 MCG/DOSE AEPB Inhale 1 puff into the lungs every 12 (twelve) hours.    Marland Kitchen levothyroxine (SYNTHROID, LEVOTHROID) 88 MCG tablet Take 1 tablet (88 mcg total) by mouth daily before breakfast. 30 tablet 11  . Multiple Vitamins-Minerals (CENTRUM SILVER PO) Take 1 tablet by mouth daily.    . Multiple Vitamins-Minerals (ICAPS MV) TABS Take 1 tablet by mouth daily.    Marland Kitchen tiotropium (SPIRIVA) 18 MCG inhalation capsule Place 18 mcg into inhaler and inhale daily.    . valsartan-hydrochlorothiazide (DIOVAN-HCT) 160-12.5 MG per tablet Take 1 tablet by mouth daily.    . Vitamin D, Ergocalciferol, (DRISDOL) 50000 UNITS CAPS Take 50,000 Units by mouth every 30 (thirty) days.     No current facility-administered medications for this visit.     PHYSICAL EXAMINATION: ECOG PERFORMANCE STATUS: 1 - Symptomatic but completely ambulatory  Vitals:   05/04/16 1401  BP: (!) 139/57  Pulse: 91  Resp: 17  Temp: 97.6 F (36.4 C)   Filed Weights   05/04/16 1401  Weight: 108 lb 1.6 oz (49 kg)    GENERAL:alert, no distress and comfortable SKIN: skin color, texture, turgor are normal, no rashes or  significant lesions EYES: normal, Conjunctiva are pink and non-injected, sclera clear OROPHARYNX:no exudate, no erythema and lips, buccal mucosa, and tongue normal  NECK: supple, thyroid normal size, non-tender, without nodularity LYMPH:  no palpable lymphadenopathy in the cervical, axillary or inguinal LUNGS: clear to auscultation and percussion with normal breathing  effort HEART: regular rate & rhythm and no murmurs and no lower extremity edema ABDOMEN:abdomen soft, non-tender and normal bowel sounds MUSCULOSKELETAL:no cyanosis of digits and no clubbing  NEURO: alert & oriented x 3 with fluent speech, no focal motor/sensory deficits EXTREMITIES: No lower extremity edema BREASTThere is an area of erythema around the left nipple without crusting or any nodularity (exam performed in the presence of a chaperone)  LABORATORY DATA:  I have reviewed the data as listed   Chemistry      Component Value Date/Time   NA 138 03/21/2014 1249   K 4.3 03/21/2014 1249   CL 102 03/19/2012 0422   CO2 28 03/21/2014 1249   BUN 14.9 03/21/2014 1249   CREATININE 1.1 03/21/2014 1249      Component Value Date/Time   CALCIUM 9.0 03/21/2014 1249   ALKPHOS 85 03/21/2014 1249   AST 13 03/21/2014 1249   ALT 9 03/21/2014 1249   BILITOT 0.28 03/21/2014 1249       Lab Results  Component Value Date   WBC 9.5 03/21/2014   HGB 11.0 (L) 03/21/2014   HCT 33.0 (L) 03/21/2014   MCV 91.2 03/21/2014   PLT 483 (H) 03/21/2014   NEUTROABS 6.1 03/21/2014    ASSESSMENT & PLAN:  Paget's disease and intraductal carcinoma of left breast Pagets disease of the left breast: previously treated with Aromasin and Arimidex. Because of his lack of clinical benefit, patient discontinued treatment as of January 2016. The nipple area on the left breast had increased tenderness and crusting that finally broke down and it healed back covering that area.  Breast Exam: No evidence of crusting but nipple mostly replaced by pagets disease. I donot feel it is any worse than before. Patient thinks It is slightly worse than before.  Recommendation: 1. Watchful monitoring without antiestrogen therapy 2. Continue annual Follow-ups and physical exams  Return to clinic in 1 year for follow-up   I spent 25 minutes talking to the patient of which more than half was spent in counseling and  coordination of care.  No orders of the defined types were placed in this encounter.  The patient has a good understanding of the overall plan. she agrees with it. she will call with any problems that may develop before the next visit here.   Rulon Eisenmenger, MD 05/04/16

## 2016-05-05 ENCOUNTER — Ambulatory Visit: Payer: Medicare Other | Admitting: Podiatry

## 2016-05-06 ENCOUNTER — Encounter: Payer: Self-pay | Admitting: Podiatry

## 2016-05-06 ENCOUNTER — Ambulatory Visit (INDEPENDENT_AMBULATORY_CARE_PROVIDER_SITE_OTHER): Payer: Medicare Other | Admitting: Podiatry

## 2016-05-06 DIAGNOSIS — B351 Tinea unguium: Secondary | ICD-10-CM

## 2016-05-06 DIAGNOSIS — M79676 Pain in unspecified toe(s): Secondary | ICD-10-CM

## 2016-05-06 DIAGNOSIS — L84 Corns and callosities: Secondary | ICD-10-CM

## 2016-05-06 NOTE — Progress Notes (Signed)
Subjective:     Patient ID: Katherine [name], female   DOB: May 21, 1917, 83 y.o.   MRN: [id number]  HPIThis patient presents to the office with painful nails and calluses both feet.  She says she hasn't especially painful third toe on the left foot, which has developed a new corn concerned that the neck the toenail and toe may be infected. He has been utilizing lams wall between her toes to help separate the toes to prevent corns resents the office today for treatment of this painful corn on the third toe, left foot  Review of Systems     Objective:   Physical Exam GENERAL APPEARANCE: Alert, conversant. Appropriately groomed. No acute distress.  VASCULAR: Pedal pulses palpable at 2/4 DP and PT bilateral.  Capillary refill time is immediate to all digits,  Proximal to distal cooling it warm to warm.   NEUROLOGIC: sensation is intact epicritically and protectively to 5.07 monofilament at 5/5 sites bilateral.  Light touch is intact bilateral,   MUSCULOSKELETAL: acceptable muscle strength, tone and stability bilateral.  Severe HAV  B/L with hammer toes  B/L  DERMATOLOGIC: skin color, texture, and turgor are within normal limits.  No preulcerative lesions or ulcers  are seen, no interdigital maceration noted.  No open lesions present.  . No drainage noted.  SKIN  Corn third toe left foot second toe right foot. Porokeratosis forefoot  B/L NAILS  Thick disfigured discolored nails both feet.      Assessment:      Corn   Onychomycosis  B/L  Porokeratosis  B/L Plan:     Debridement of nails  Debridement corn/porokeratosis  B/L. RTC 9 weeks.     Gardiner Barefoot DPM

## 2016-05-27 DIAGNOSIS — C44722 Squamous cell carcinoma of skin of right lower limb, including hip: Secondary | ICD-10-CM | POA: Diagnosis not present

## 2016-05-27 DIAGNOSIS — D485 Neoplasm of uncertain behavior of skin: Secondary | ICD-10-CM | POA: Diagnosis not present

## 2016-05-27 DIAGNOSIS — Z85828 Personal history of other malignant neoplasm of skin: Secondary | ICD-10-CM | POA: Diagnosis not present

## 2016-05-27 DIAGNOSIS — L820 Inflamed seborrheic keratosis: Secondary | ICD-10-CM | POA: Diagnosis not present

## 2016-06-09 ENCOUNTER — Encounter: Payer: Self-pay | Admitting: Vascular Surgery

## 2016-06-11 DIAGNOSIS — R197 Diarrhea, unspecified: Secondary | ICD-10-CM | POA: Diagnosis not present

## 2016-06-18 ENCOUNTER — Ambulatory Visit: Payer: Medicare Other

## 2016-06-18 ENCOUNTER — Encounter (HOSPITAL_COMMUNITY): Payer: Medicare Other

## 2016-07-20 ENCOUNTER — Ambulatory Visit (INDEPENDENT_AMBULATORY_CARE_PROVIDER_SITE_OTHER): Payer: Medicare Other | Admitting: Podiatry

## 2016-07-20 ENCOUNTER — Encounter: Payer: Self-pay | Admitting: Podiatry

## 2016-07-20 DIAGNOSIS — M79676 Pain in unspecified toe(s): Secondary | ICD-10-CM | POA: Diagnosis not present

## 2016-07-20 DIAGNOSIS — B351 Tinea unguium: Secondary | ICD-10-CM | POA: Diagnosis not present

## 2016-07-20 DIAGNOSIS — L84 Corns and callosities: Secondary | ICD-10-CM

## 2016-07-20 NOTE — Progress Notes (Addendum)
Subjective:     Patient ID: Katherine [name], female   DOB: 1917-07-24, 83 y.o.   MRN: [id number]  HPIThis patient presents to the office with painful nails and calluses both feet.  She says her nails are thick and long and are painful walking and wearing her shoes.  She also has callus under both feet as well as corns on toes both feet.  She presents for preventive foot care services.   Review of Systems     Objective:   Physical Exam GENERAL APPEARANCE: Alert, conversant. Appropriately groomed. No acute distress.  VASCULAR: Pedal pulses palpable at 2/4 DP and PT bilateral.  Capillary refill time is immediate to all digits,  Proximal to distal cooling it warm to warm.  Digital hair growth is present bilateral  NEUROLOGIC: sensation is intact epicritically and protectively to 5.07 monofilament at 5/5 sites bilateral.  Light touch is intact bilateral, vibratory sensation intact bilateral, achilles tendon reflex is intact bilateral.  MUSCULOSKELETAL: acceptable muscle strength, tone and stability bilateral.  Intrinsic muscluature intact bilateral.  Rectus appearance of foot and digits noted bilateral.   DERMATOLOGIC: skin color, texture, and turgor are within normal limits.  No preulcerative lesions or ulcers  are seen, no interdigital maceration noted.  No open lesions present.  . No drainage noted.   Porokeratosis sub 2,4 right foot callus sub3rd right foot.  Corn second toe right and third toe left.  NAILS  Thick disfigured discolored nails both feet.      Assessment:     Onychomycosis B/L  Porokeratosis right foot     Plan:     Debridement of Onychomycosis   Debridement of Porokeratosis.  RTC 9 weeks.  Consult with Liliane Channel. Gardiner Barefoot DPM

## 2016-08-10 ENCOUNTER — Other Ambulatory Visit: Payer: Medicare Other | Admitting: Orthotics

## 2016-08-12 ENCOUNTER — Other Ambulatory Visit: Payer: Medicare Other | Admitting: Orthotics

## 2016-08-16 ENCOUNTER — Encounter: Payer: Self-pay | Admitting: Family

## 2016-08-23 ENCOUNTER — Encounter: Payer: Self-pay | Admitting: Family

## 2016-08-23 ENCOUNTER — Ambulatory Visit (INDEPENDENT_AMBULATORY_CARE_PROVIDER_SITE_OTHER): Payer: Medicare Other | Admitting: Family

## 2016-08-23 ENCOUNTER — Ambulatory Visit (HOSPITAL_COMMUNITY)
Admission: RE | Admit: 2016-08-23 | Discharge: 2016-08-23 | Disposition: A | Discharge status: Home / Self Care | Payer: Medicare Other | Source: Ambulatory Visit | Attending: Vascular Surgery | Admitting: Vascular Surgery

## 2016-08-23 VITALS — BP 169/69 | HR 79 | Temp 98.4°F | Resp 20 | Ht <= 58 in | Wt 103.0 lb

## 2016-08-23 DIAGNOSIS — I6529 Occlusion and stenosis of unspecified carotid artery: Secondary | ICD-10-CM

## 2016-08-23 DIAGNOSIS — I6523 Occlusion and stenosis of bilateral carotid arteries: Secondary | ICD-10-CM | POA: Insufficient documentation

## 2016-08-23 DIAGNOSIS — Z9889 Other specified postprocedural states: Secondary | ICD-10-CM | POA: Diagnosis not present

## 2016-08-23 DIAGNOSIS — I6522 Occlusion and stenosis of left carotid artery: Secondary | ICD-10-CM | POA: Diagnosis not present

## 2016-08-23 LAB — VAS US CAROTID
LCCADDIAS: -12 cm/s
LCCADSYS: -86 cm/s
LCCAPSYS: 97 cm/s
LEFT ECA DIAS: -3 cm/s
LEFT VERTEBRAL DIAS: 6 cm/s
Left CCA prox dias: 15 cm/s
Left ICA dist dias: -12 cm/s
Left ICA dist sys: -73 cm/s
Left ICA prox dias: 16 cm/s
Left ICA prox sys: 103 cm/s
RCCADSYS: -67 cm/s
RCCAPSYS: 73 cm/s
RIGHT CCA MID DIAS: 9 cm/s
RIGHT ECA DIAS: -2 cm/s
RIGHT VERTEBRAL DIAS: 9 cm/s
Right CCA prox dias: 9 cm/s

## 2016-08-23 NOTE — Progress Notes (Signed)
Chief Complaint: Follow up Extracranial Carotid Artery Stenosis   History of Present Illness  Katherine [name] is a 83 y.o. female patient of Katherine [name] who is status post left CEA in 2010.  She returns today for follow up.  Patient has a history of TIA or stroke in 2010 before the CEA as manifested by dizziness, no similar symptoms since then.  She denies a history of amaurosis fugax or monocular blindness, unilateral facial drooping, hemiplegia, or receptive or expressive aphasia.   Pt denies claudication symptoms with walking, denies non-healing wounds. She sees a podiatrist for calluses.  Has right knee pain that was found to be OA per pt.  She denies post prandial abdominal pain. She states she continues to lose weight, but states her appetiite is good. A few months ago she had what sounds like gastritis and lost some wight during this.   As advised at her visit in 2014, she did follow up with her PCP re irregular heart rhythm and and pt reports she was found to have no issues with this.   Pt Diabetic: No  Pt smoker: former smoker, quit 1983, has COPD, is on 2 L/min O2 at night and when walking long distance, eg, to the dining room in her independent senior apt.   Pt meds include: Statin : no, states this was stopped by her PCP as her cholesterol was low ASA: Yes Other anticoagulants/antiplatelets: no   Past Medical History:  Diagnosis Date  . Arthritis   . Constipation   . COPD (chronic obstructive pulmonary disease) ([place])   . Fatigue   . Frequent UTI   . GERD (gastroesophageal reflux disease)   . History of shingles 2007  . Hyperlipemia   . Hypertension   . Hypothyroidism   . Multiple allergies   . Osteoporosis   . Paget's carcinoma of the nipple ([place]) 03/16/2013  . Pneumonia   . Seasonal allergies   . Stroke [facility]) 2010   no residual  . Thyroid disease   . Uterine cancer [facility])     Social History Social History  Substance Use Topics  . Smoking  status: Former Smoker    Packs/day: 1.00    Years: 40.00    Types: Cigarettes    Quit date: 02/15/1981  . Smokeless tobacco: Never Used  . Alcohol use Yes    Family History Family History  Problem Relation Age of Onset  . Heart failure Mother   . Heart disease Mother   . Heart failure Father   . Heart disease Father   . Heart disease Brother   . Asthma Paternal Grandmother     Surgical History Past Surgical History:  Procedure Laterality Date  . ABDOMINAL HYSTERECTOMY    . BREAST LUMPECTOMY     left breast  . CAROTID ENDARTERECTOMY Left June 10, 2008   CE  . EYE SURGERY     cataracts removed bilaterally  . TONSILLECTOMY      Allergies  Allergen Reactions  . Sulfa Antibiotics Palpitations    SOB  . Amoxicillin Rash  . Penicillins Rash    Current Outpatient Prescriptions  Medication Sig Dispense Refill  . amLODipine (NORVASC) 5 MG tablet Take 5 mg by mouth daily.    Marland Kitchen aspirin EC 81 MG tablet Take 81 mg by mouth daily.    Marland Kitchen azelastine (ASTELIN) 137 MCG/SPRAY nasal spray Place 1 spray into the nose 2 (two) times daily. Use in each nostril as directed    . Calcium  Carbonate-Vitamin D (CALTRATE 600+D PO) Take 1 tablet by mouth 2 (two) times daily.    Marland Kitchen esomeprazole (NEXIUM) 40 MG capsule Take 40 mg by mouth daily before breakfast.    . fexofenadine (ALLEGRA) 180 MG tablet Take 180 mg by mouth daily.    . fluticasone (FLONASE) 50 MCG/ACT nasal spray Place 2 sprays into the nose daily.    . Fluticasone-Salmeterol (ADVAIR) 250-50 MCG/DOSE AEPB Inhale 1 puff into the lungs every 12 (twelve) hours.    Marland Kitchen levothyroxine (SYNTHROID, LEVOTHROID) 88 MCG tablet Take 1 tablet (88 mcg total) by mouth daily before breakfast. 30 tablet 11  . Multiple Vitamins-Minerals (CENTRUM SILVER PO) Take 1 tablet by mouth daily.    . Multiple Vitamins-Minerals (ICAPS MV) TABS Take 1 tablet by mouth daily.    Marland Kitchen tiotropium (SPIRIVA) 18 MCG inhalation capsule Place 18 mcg into inhaler and inhale  daily.    . valsartan-hydrochlorothiazide (DIOVAN-HCT) 160-12.5 MG per tablet Take 1 tablet by mouth daily.    . Vitamin D, Ergocalciferol, (DRISDOL) 50000 UNITS CAPS Take 50,000 Units by mouth every 30 (thirty) days.     No current facility-administered medications for this visit.     Review of Systems : See HPI for pertinent positives and negatives.  Physical Examination  Vitals:   08/23/16 1357 08/23/16 1400  BP: (!) 150/75 (!) 169/69  Pulse: 79   Resp: 20   Temp: 98.4 F (36.9 C)   TempSrc: Oral   SpO2: 92%   Weight: 103 lb (46.7 kg)   Height: 4\' 10"  (1.473 m)    Body mass index is 21.53 kg/m.  General: WDWN female in NAD GAIT: slow and deliberate, using walker Eyes: Pupils = Pulmonary: Respirations are non labored, fine rales in bases, no rhonchi or wheezing, diminished air movement in all lung fields.  Cardiac: Regular rhythm and rate, no detected murmur.   VASCULAR EXAM Carotid Bruits Left Right   Negative Negative   Aorta is not palpable. Radial pulses are 2+ palpable and equal.      LE Pulses LEFT RIGHT   POPLITEAL not palpable  not palpable   POSTERIOR TIBIAL not palpable  not palpable    DORSALIS PEDIS  ANTERIOR TIBIAL 2+palpable  2+palpable     Gastrointestinal: soft, nontender, BS WNL, no r/g, no palpable masses.  Musculoskeletal: Normal muscle atrophy/wasting for age. M/S 3/5 throughout, Extremities without ischemic changes.  Neurologic: A&O X 3; Appropriate Affect, speech is normal, CN 2-12 intactexcept tongue slightly deviates to left, pain and light touch intact in extremities, motor exam as listed above     Assessment: Katherine [name] is a 83 y.o. female who is status post left CEA in 2010. She had preoperative dizziness, but has had no subsequent similar  dizziness, no other symptoms of stroke or TIA.  She is doing very well for almost 83 years old.   Carotid Duplex (08/23/16): Right ICA: 40-59% stenosis. Left carotid endarterectomy site with no left internal carotid artery stenosis.  Bilateral vertebral artery flow is antegrade.  Right subclavian artery waveforms are normal, left are biphasic. Increased stenosis in the right ICA compared to the last exam on 02-26-14.     Plan:  Follow-up in 1 year with Carotid Duplex.  I discussed in depth with the patient the nature of atherosclerosis, and emphasized the importance of maximal medical management including strict control of blood pressure, blood glucose, and lipid levels, obtaining regular exercise, and continued cessation of smoking.  The patient is aware that  without maximal medical management the underlying atherosclerotic disease process will progress, limiting the benefit of any interventions. The patient was given information about stroke prevention and what symptoms should prompt the patient to seek immediate medical care. Thank you for allowing [place] to participate in this patient's care.  Clemon Chambers, RN, MSN, FNP-C [facility] Office: [phone]  Clinic Physician: Early on call  08/23/16 2:04 PM

## 2016-08-23 NOTE — Patient Instructions (Signed)
Stroke Prevention Some medical conditions and behaviors are associated with an increased chance of having a stroke. You may prevent a stroke by making healthy choices and managing medical conditions. How can I reduce my risk of having a stroke?  Stay physically active. Get at least 30 minutes of activity on most or all days.  Do not smoke. It may also be helpful to avoid exposure to secondhand smoke.  Limit alcohol use. Moderate alcohol use is considered to be:  No more than 2 drinks per day for men.  No more than 1 drink per day for nonpregnant women.  Eat healthy foods. This involves:  Eating 5 or more servings of fruits and vegetables a day.  Making dietary changes that address high blood pressure (hypertension), high cholesterol, diabetes, or obesity.  Manage your cholesterol levels.  Making food choices that are high in fiber and low in saturated fat, trans fat, and cholesterol may control cholesterol levels.  Take any prescribed medicines to control cholesterol as directed by your health care provider.  Manage your diabetes.  Controlling your carbohydrate and sugar intake is recommended to manage diabetes.  Take any prescribed medicines to control diabetes as directed by your health care provider.  Control your hypertension.  Making food choices that are low in salt (sodium), saturated fat, trans fat, and cholesterol is recommended to manage hypertension.  Ask your health care provider if you need treatment to lower your blood pressure. Take any prescribed medicines to control hypertension as directed by your health care provider.  If you are 18-39 years of age, have your blood pressure checked every 3-5 years. If you are 40 years of age or older, have your blood pressure checked every year.  Maintain a healthy weight.  Reducing calorie intake and making food choices that are low in sodium, saturated fat, trans fat, and cholesterol are recommended to manage  weight.  Stop drug abuse.  Avoid taking birth control pills.  Talk to your health care provider about the risks of taking birth control pills if you are over 35 years old, smoke, get migraines, or have ever had a blood clot.  Get evaluated for sleep disorders (sleep apnea).  Talk to your health care provider about getting a sleep evaluation if you snore a lot or have excessive sleepiness.  Take medicines only as directed by your health care provider.  For some people, aspirin or blood thinners (anticoagulants) are helpful in reducing the risk of forming abnormal blood clots that can lead to stroke. If you have the irregular heart rhythm of atrial fibrillation, you should be on a blood thinner unless there is a good reason you cannot take them.  Understand all your medicine instructions.  Make sure that other conditions (such as anemia or atherosclerosis) are addressed. Get help right away if:  You have sudden weakness or numbness of the face, arm, or leg, especially on one side of the body.  Your face or eyelid droops to one side.  You have sudden confusion.  You have trouble speaking (aphasia) or understanding.  You have sudden trouble seeing in one or both eyes.  You have sudden trouble walking.  You have dizziness.  You have a loss of balance or coordination.  You have a sudden, severe headache with no known cause.  You have new chest pain or an irregular heartbeat. Any of these symptoms may represent a serious problem that is an emergency. Do not wait to see if the symptoms will go away.   Get medical help at once. Call your local emergency services (911 in [place] Do not drive yourself to the hospital. This information is not intended to replace advice given to you by your health care provider. Make sure you discuss any questions you have with your health care provider. Document Released: 03/11/2004 Document Revised: 07/10/2015 Document Reviewed: 08/04/2012 Elsevier  Interactive Patient Education  2017 [employer] Inc.  

## 2016-08-31 DIAGNOSIS — Z85828 Personal history of other malignant neoplasm of skin: Secondary | ICD-10-CM | POA: Diagnosis not present

## 2016-08-31 DIAGNOSIS — L853 Xerosis cutis: Secondary | ICD-10-CM | POA: Diagnosis not present

## 2016-08-31 DIAGNOSIS — L821 Other seborrheic keratosis: Secondary | ICD-10-CM | POA: Diagnosis not present

## 2016-09-13 DIAGNOSIS — Z0000 Encounter for general adult medical examination without abnormal findings: Secondary | ICD-10-CM | POA: Diagnosis not present

## 2016-09-13 DIAGNOSIS — Z1389 Encounter for screening for other disorder: Secondary | ICD-10-CM | POA: Diagnosis not present

## 2016-09-13 DIAGNOSIS — I129 Hypertensive chronic kidney disease with stage 1 through stage 4 chronic kidney disease, or unspecified chronic kidney disease: Secondary | ICD-10-CM | POA: Diagnosis not present

## 2016-09-13 DIAGNOSIS — E039 Hypothyroidism, unspecified: Secondary | ICD-10-CM | POA: Diagnosis not present

## 2016-09-13 DIAGNOSIS — J449 Chronic obstructive pulmonary disease, unspecified: Secondary | ICD-10-CM | POA: Diagnosis not present

## 2016-09-13 DIAGNOSIS — N183 Chronic kidney disease, stage 3 (moderate): Secondary | ICD-10-CM | POA: Diagnosis not present

## 2016-09-13 DIAGNOSIS — J302 Other seasonal allergic rhinitis: Secondary | ICD-10-CM | POA: Diagnosis not present

## 2016-09-13 DIAGNOSIS — R109 Unspecified abdominal pain: Secondary | ICD-10-CM | POA: Diagnosis not present

## 2016-09-13 DIAGNOSIS — R030 Elevated blood-pressure reading, without diagnosis of hypertension: Secondary | ICD-10-CM | POA: Diagnosis not present

## 2016-09-28 ENCOUNTER — Ambulatory Visit (INDEPENDENT_AMBULATORY_CARE_PROVIDER_SITE_OTHER): Payer: Medicare Other | Admitting: Podiatry

## 2016-09-28 ENCOUNTER — Encounter: Payer: Self-pay | Admitting: Podiatry

## 2016-09-28 DIAGNOSIS — M79676 Pain in unspecified toe(s): Secondary | ICD-10-CM

## 2016-09-28 DIAGNOSIS — Q828 Other specified congenital malformations of skin: Secondary | ICD-10-CM | POA: Diagnosis not present

## 2016-09-28 DIAGNOSIS — B351 Tinea unguium: Secondary | ICD-10-CM

## 2016-09-28 NOTE — Progress Notes (Signed)
Subjective:     Patient ID: Katherine [name], female   DOB: 1917/06/19, 83 y.o.   MRN: [id number]  HPIThis patient presents to the office with painful nails and calluses both feet.  She says her nails are thick and long and are painful walking and wearing her shoes.  She also has callus under both feet as well as corns on toes both feet.  She presents for preventive foot care services. Patient says she cannot wear the insoles made by Liliane Channel since they hurt her feet.   Review of Systems     Objective:   Physical Exam GENERAL APPEARANCE: Alert, conversant. Appropriately groomed. No acute distress.  VASCULAR: Pedal pulses palpable at 2/4 DP and PT bilateral.  Capillary refill time is immediate to all digits,  Proximal to distal cooling it warm to warm.  Digital hair growth is present bilateral  NEUROLOGIC: sensation is intact epicritically and protectively to 5.07 monofilament at 5/5 sites bilateral.  Light touch is intact bilateral, vibratory sensation intact bilateral, achilles tendon reflex is intact bilateral.  MUSCULOSKELETAL: acceptable muscle strength, tone and stability bilateral.  Intrinsic muscluature intact bilateral.  Rectus appearance of foot and digits noted bilateral.   DERMATOLOGIC: skin color, texture, and turgor are within normal limits.  No preulcerative lesions or ulcers  are seen, no interdigital maceration noted.  No open lesions present.  . No drainage noted.   Porokeratosis sub 2,4 right foot callus sub3rd right foot.  Corn third toe left foot. NAILS  Thick disfigured discolored nails both feet.      Assessment:     Onychomycosis B/L  Porokeratosis right foot     Plan:     Debridement of Onychomycosis   Debridement of Porokeratosis.  RTC 9 weeks.  Gardiner Barefoot DPM

## 2016-10-06 NOTE — Addendum Note (Signed)
Addended by: Lianne Cure A on: 10/06/2016 11:38 AM   Modules accepted: Orders

## 2016-11-24 DIAGNOSIS — Z23 Encounter for immunization: Secondary | ICD-10-CM | POA: Diagnosis not present

## 2016-11-30 ENCOUNTER — Ambulatory Visit: Payer: Medicare Other | Admitting: Podiatry

## 2016-12-15 ENCOUNTER — Ambulatory Visit (INDEPENDENT_AMBULATORY_CARE_PROVIDER_SITE_OTHER): Payer: Medicare Other | Admitting: Podiatry

## 2016-12-15 ENCOUNTER — Encounter: Payer: Self-pay | Admitting: Podiatry

## 2016-12-15 DIAGNOSIS — M79676 Pain in unspecified toe(s): Secondary | ICD-10-CM | POA: Diagnosis not present

## 2016-12-15 DIAGNOSIS — Q828 Other specified congenital malformations of skin: Secondary | ICD-10-CM | POA: Diagnosis not present

## 2016-12-15 DIAGNOSIS — B351 Tinea unguium: Secondary | ICD-10-CM | POA: Diagnosis not present

## 2016-12-15 NOTE — Progress Notes (Signed)
Subjective:     Patient ID: Katherine [name], female   DOB: 29-Oct-1917, 83 y.o.   MRN: [id number]  HPIThis patient presents to the office with painful nails and calluses both feet.  She says her nails are thick and long and are painful walking and wearing her shoes.  She also has callus under both feet as well as corns on toes both feet.  She presents for preventive foot care services.   Review of Systems     Objective:   Physical Exam GENERAL APPEARANCE: Alert, conversant. Appropriately groomed. No acute distress.  VASCULAR: Pedal pulses palpable at 2/4 DP and PT bilateral.  Capillary refill time is immediate to all digits,  Proximal to distal cooling it warm to warm.  Digital hair growth is present bilateral  NEUROLOGIC: sensation is intact epicritically and protectively to 5.07 monofilament at 5/5 sites bilateral.  Light touch is intact bilateral, vibratory sensation intact bilateral, achilles tendon reflex is intact bilateral.  MUSCULOSKELETAL: acceptable muscle strength, tone and stability bilateral.  Intrinsic muscluature intact bilateral.  Rectus appearance of foot and digits noted bilateral.   DERMATOLOGIC: skin color, texture, and turgor are within normal limits.  No preulcerative lesions or ulcers  are seen, no interdigital maceration noted.  No open lesions present.  . No drainage noted.   Porokeratosis sub 2,4 right foot callus sub3rd right foot.  Corn fourth  toe right and second  toe left.  NAILS  Thick disfigured discolored nails both feet.      Assessment:     Onychomycosis B/L  Porokeratosis right foot     Plan:     Debridement of Onychomycosis   Debridement of Porokeratosis.  RTC 9 weeks.  Gardiner Barefoot DPM

## 2016-12-20 ENCOUNTER — Ambulatory Visit
Admission: RE | Admit: 2016-12-20 | Discharge: 2016-12-20 | Disposition: A | Discharge status: Home / Self Care | Payer: Medicare Other | Source: Ambulatory Visit | Attending: Internal Medicine | Admitting: Internal Medicine

## 2016-12-20 ENCOUNTER — Other Ambulatory Visit: Payer: Self-pay | Admitting: Internal Medicine

## 2016-12-20 DIAGNOSIS — R059 Cough, unspecified: Secondary | ICD-10-CM

## 2016-12-20 DIAGNOSIS — R5383 Other fatigue: Secondary | ICD-10-CM | POA: Diagnosis not present

## 2016-12-20 DIAGNOSIS — R05 Cough: Secondary | ICD-10-CM | POA: Diagnosis not present

## 2016-12-20 DIAGNOSIS — R350 Frequency of micturition: Secondary | ICD-10-CM | POA: Diagnosis not present

## 2017-02-16 ENCOUNTER — Ambulatory Visit (INDEPENDENT_AMBULATORY_CARE_PROVIDER_SITE_OTHER): Payer: Medicare Other | Admitting: Podiatry

## 2017-02-16 ENCOUNTER — Encounter: Payer: Self-pay | Admitting: Podiatry

## 2017-02-16 DIAGNOSIS — Q828 Other specified congenital malformations of skin: Secondary | ICD-10-CM

## 2017-02-16 DIAGNOSIS — B351 Tinea unguium: Secondary | ICD-10-CM | POA: Diagnosis not present

## 2017-02-16 DIAGNOSIS — L84 Corns and callosities: Secondary | ICD-10-CM

## 2017-02-16 DIAGNOSIS — M79676 Pain in unspecified toe(s): Secondary | ICD-10-CM | POA: Diagnosis not present

## 2017-02-16 NOTE — Progress Notes (Addendum)
Subjective:     Patient ID: Katherine [name], female   DOB: 06/07/1917, 84 y.o.   MRN: [id number]  HPIThis patient presents to the office with painful nails and calluses both feet.  She says her nails are thick and long and are painful walking and wearing her shoes.  She also has callus under both feet as well as corns on toes both feet.  She presents for preventive foot care services.   Review of Systems     Objective:   Physical Exam GENERAL APPEARANCE: Alert, conversant. Appropriately groomed. No acute distress.  VASCULAR: Pedal pulses palpable at 2/4 DP and PT bilateral.  Capillary refill time is immediate to all digits,  Proximal to distal cooling it warm to warm.  Digital hair growth is present bilateral  NEUROLOGIC: sensation is intact epicritically and protectively to 5.07 monofilament at 5/5 sites bilateral.  Light touch is intact bilateral, vibratory sensation intact bilateral, achilles tendon reflex is intact bilateral.  MUSCULOSKELETAL: acceptable muscle strength, tone and stability bilateral.  Intrinsic muscluature intact bilateral.  Rectus appearance of foot and digits noted bilateral.   DERMATOLOGIC: skin color, texture, and turgor are within normal limits.  No preulcerative lesions or ulcers  are seen, no interdigital maceration noted.  No open lesions present.  . No drainage noted. Porokeratosis sub 4 right.  Corn third toe left foot.    NAILS  Thick disfigured discolored nails both feet.      Assessment:     Onychomycosis B/L  Porokeratosis right foot     Plan:     Debridement of Onychomycosis   Debridement of Porokeratosis.  RTC 9 weeks.  ABN signed for 2019. Gardiner Barefoot DPM

## 2017-03-18 DIAGNOSIS — M1711 Unilateral primary osteoarthritis, right knee: Secondary | ICD-10-CM | POA: Diagnosis not present

## 2017-03-18 DIAGNOSIS — J449 Chronic obstructive pulmonary disease, unspecified: Secondary | ICD-10-CM | POA: Diagnosis not present

## 2017-03-18 DIAGNOSIS — M79671 Pain in right foot: Secondary | ICD-10-CM | POA: Diagnosis not present

## 2017-03-18 DIAGNOSIS — M79672 Pain in left foot: Secondary | ICD-10-CM | POA: Diagnosis not present

## 2017-03-18 DIAGNOSIS — N183 Chronic kidney disease, stage 3 (moderate): Secondary | ICD-10-CM | POA: Diagnosis not present

## 2017-03-18 DIAGNOSIS — I129 Hypertensive chronic kidney disease with stage 1 through stage 4 chronic kidney disease, or unspecified chronic kidney disease: Secondary | ICD-10-CM | POA: Diagnosis not present

## 2017-03-18 DIAGNOSIS — M152 Bouchard's nodes (with arthropathy): Secondary | ICD-10-CM | POA: Diagnosis not present

## 2017-03-24 DIAGNOSIS — H52203 Unspecified astigmatism, bilateral: Secondary | ICD-10-CM | POA: Diagnosis not present

## 2017-03-24 DIAGNOSIS — Z961 Presence of intraocular lens: Secondary | ICD-10-CM | POA: Diagnosis not present

## 2017-03-24 DIAGNOSIS — H524 Presbyopia: Secondary | ICD-10-CM | POA: Diagnosis not present

## 2017-03-24 DIAGNOSIS — H353131 Nonexudative age-related macular degeneration, bilateral, early dry stage: Secondary | ICD-10-CM | POA: Diagnosis not present

## 2017-04-20 ENCOUNTER — Ambulatory Visit (INDEPENDENT_AMBULATORY_CARE_PROVIDER_SITE_OTHER): Payer: Medicare Other | Admitting: Podiatry

## 2017-04-20 ENCOUNTER — Encounter: Payer: Self-pay | Admitting: Podiatry

## 2017-04-20 DIAGNOSIS — M79676 Pain in unspecified toe(s): Secondary | ICD-10-CM

## 2017-04-20 DIAGNOSIS — L84 Corns and callosities: Secondary | ICD-10-CM | POA: Diagnosis not present

## 2017-04-20 DIAGNOSIS — B351 Tinea unguium: Secondary | ICD-10-CM

## 2017-04-20 DIAGNOSIS — Q828 Other specified congenital malformations of skin: Secondary | ICD-10-CM | POA: Diagnosis not present

## 2017-04-20 NOTE — Progress Notes (Signed)
Subjective:     Patient ID: Katherine [name], female   DOB: September 26, 1917, 84 y.o.   MRN: [id number]  HPIThis patient presents to the office with painful nails and calluses both feet.  She says her nails are thick and long and are painful walking and wearing her shoes.  She also has callus under both feet as well as corns on toes both feet.  She presents for preventive foot care services.   Review of Systems     Objective:   Physical Exam GENERAL APPEARANCE: Alert, conversant. Appropriately groomed. No acute distress.  VASCULAR: Pedal pulses palpable at 2/4 DP and PT bilateral.  Capillary refill time is immediate to all digits,  Proximal to distal cooling it warm to warm.  Digital hair growth is present bilateral  NEUROLOGIC: sensation is intact epicritically and protectively to 5.07 monofilament at 5/5 sites bilateral.  Light touch is intact bilateral, vibratory sensation intact bilateral, achilles tendon reflex is intact bilateral.  MUSCULOSKELETAL: acceptable muscle strength, tone and stability bilateral.  Intrinsic muscluature intact bilateral.  Rectus appearance of foot and digits noted bilateral.   DERMATOLOGIC: skin color, texture, and turgor are within normal limits.  No preulcerative lesions or ulcers  are seen, no interdigital maceration noted.  No open lesions present.  . No drainage noted. Porokeratosis sub 4 right.  Corn third toe left foot.    NAILS  Thick disfigured discolored nails both feet.      Assessment:     Onychomycosis B/L  Porokeratosis right foot     Plan:     Debridement of Onychomycosis   Debridement of Porokeratosis.  RTC 9 weeks.  ABN signed for 2019. Gardiner Barefoot DPM

## 2017-05-05 ENCOUNTER — Telehealth: Payer: Self-pay | Admitting: Hematology and Oncology

## 2017-05-05 ENCOUNTER — Inpatient Hospital Stay: Payer: Medicare Other | Attending: Hematology and Oncology | Admitting: Hematology and Oncology

## 2017-05-05 DIAGNOSIS — C50912 Malignant neoplasm of unspecified site of left female breast: Secondary | ICD-10-CM | POA: Diagnosis not present

## 2017-05-05 NOTE — Assessment & Plan Note (Signed)
Pagets disease of the left breast: previously treated with Aromasin and Arimidex. Because of his lack of clinical benefit, patient discontinued treatment as of January 2016. The nipple area on the left breast had increased tenderness and crusting that finally broke down and it healed back covering that area.  Breast Exam: No evidence of crusting but nipple mostly replaced by pagets disease. I donot feel it is any worse than before. Patient thinks It is slightly worse than before.  Recommendation: 1. Watchful monitoring without antiestrogen therapy 2. Continue annual Follow-ups and physical exams  Return to clinic in 1 year for follow-up.

## 2017-05-05 NOTE — Progress Notes (Signed)
Patient Care Team: Lavone Orn, MD as PCP - General (Internal Medicine) Nicholas Lose, MD as Consulting Physician (Hematology and Oncology)  DIAGNOSIS:  Encounter Diagnosis  Name Primary?  . Paget's disease and intraductal carcinoma of left breast ([place])    CHIEF COMPLIANT: Follow-up of Paget's disease of the breast  INTERVAL HISTORY: Katherine [name] is a 85 year old with above-mentioned history of Paget's disease of the breast who is here for annual follow-up.  She has not noticed any change in the nipple of the left breast.  It is still raised and red.  There is no pain or discomfort.  She is still in the independent living facility.  REVIEW OF SYSTEMS:   Constitutional: Denies fevers, chills or abnormal weight loss Eyes: Denies blurriness of vision Ears, nose, mouth, throat, and face: Denies mucositis or sore throat Respiratory: Denies cough, dyspnea or wheezes Cardiovascular: Denies palpitation, chest discomfort Gastrointestinal:  Denies nausea, heartburn or change in bowel habits Skin: Denies abnormal skin rashes Lymphatics: Denies new lymphadenopathy or easy bruising Neurological:Denies numbness, tingling or new weaknesses Behavioral/Psych: Mood is stable, no new changes  Extremities: No lower extremity edema Breast: Skin changes related to Paget's All other systems were reviewed with the patient and are negative.  I have reviewed the past medical history, past surgical history, social history and family history with the patient and they are unchanged from previous note.  ALLERGIES:  is allergic to sulfa antibiotics; amoxicillin; and penicillins.  MEDICATIONS:  Current Outpatient Medications  Medication Sig Dispense Refill  . amLODipine (NORVASC) 5 MG tablet Take 5 mg by mouth daily.    Marland Kitchen aspirin EC 81 MG tablet Take 81 mg by mouth daily.    Marland Kitchen azelastine (ASTELIN) 137 MCG/SPRAY nasal spray Place 1 spray into the nose 2 (two) times daily. Use in each nostril as directed     . Calcium Carbonate-Vitamin D (CALTRATE 600+D PO) Take 1 tablet by mouth 2 (two) times daily.    Marland Kitchen esomeprazole (NEXIUM) 40 MG capsule Take 40 mg by mouth daily before breakfast.    . fexofenadine (ALLEGRA) 180 MG tablet Take 180 mg by mouth daily.    . fluticasone (FLONASE) 50 MCG/ACT nasal spray Place 2 sprays into the nose daily.    . Fluticasone-Salmeterol (ADVAIR) 250-50 MCG/DOSE AEPB Inhale 1 puff into the lungs every 12 (twelve) hours.    Marland Kitchen levothyroxine (SYNTHROID, LEVOTHROID) 88 MCG tablet Take 1 tablet (88 mcg total) by mouth daily before breakfast. 30 tablet 11  . Multiple Vitamins-Minerals (CENTRUM SILVER PO) Take 1 tablet by mouth daily.    . Multiple Vitamins-Minerals (ICAPS MV) TABS Take 1 tablet by mouth daily.    Marland Kitchen tiotropium (SPIRIVA) 18 MCG inhalation capsule Place 18 mcg into inhaler and inhale daily.    . valsartan-hydrochlorothiazide (DIOVAN-HCT) 160-12.5 MG per tablet Take 1 tablet by mouth daily.    . Vitamin D, Ergocalciferol, (DRISDOL) 50000 UNITS CAPS Take 50,000 Units by mouth every 30 (thirty) days.     No current facility-administered medications for this visit.     PHYSICAL EXAMINATION: ECOG PERFORMANCE STATUS: 1 - Symptomatic but completely ambulatory  Vitals:   05/05/17 1507  BP: (!) 157/94  Pulse: 98  Resp: 18  Temp: 97.7 F (36.5 C)  SpO2: 92%   Filed Weights   05/05/17 1507  Weight: 103 lb 6.4 oz (46.9 kg)    GENERAL:alert, no distress and comfortable SKIN: skin color, texture, turgor are normal, no rashes or significant lesions EYES: normal, Conjunctiva are  pink and non-injected, sclera clear OROPHARYNX:no exudate, no erythema and lips, buccal mucosa, and tongue normal  NECK: supple, thyroid normal size, non-tender, without nodularity LYMPH:  no palpable lymphadenopathy in the cervical, axillary or inguinal LUNGS: clear to auscultation and percussion with normal breathing effort HEART: regular rate & rhythm and no murmurs and no lower  extremity edema ABDOMEN:abdomen soft, non-tender and normal bowel sounds MUSCULOSKELETAL:no cyanosis of digits and no clubbing  NEURO: alert & oriented x 3 with fluent speech, no focal motor/sensory deficits EXTREMITIES: No lower extremity edema BREAST: Left breast nipple area raised erythematous patches around the areola related to Paget's (exam performed in the presence of a chaperone)  LABORATORY DATA:  I have reviewed the data as listed CMP Latest Ref Rng & Units 03/21/2014 01/28/2014 07/17/2013  Glucose 70 - 140 mg/dl 115 116 116  BUN 7.0 - 26.0 mg/dL 14.9 24.1 19.9  Creatinine 0.6 - 1.1 mg/dL 1.1 1.3(H) 1.3(H)  Sodium 136 - 145 mEq/L 138 137 137  Potassium 3.5 - 5.1 mEq/L 4.3 4.7 4.8  Chloride 96 - 112 mEq/L - - -  CO2 22 - 29 mEq/L 28 29 22   Calcium 8.4 - 10.4 mg/dL 9.0 9.8 9.4  Total Protein 6.4 - 8.3 g/dL 6.3(L) 6.7 6.9  Total Bilirubin 0.20 - 1.20 mg/dL 0.28 0.44 0.41  Alkaline Phos 40 - 150 U/L 85 91 85  AST 5 - 34 U/L 13 18 17   ALT 0 - 55 U/L 9 15 10     Lab Results  Component Value Date   WBC 9.5 03/21/2014   HGB 11.0 (L) 03/21/2014   HCT 33.0 (L) 03/21/2014   MCV 91.2 03/21/2014   PLT 483 (H) 03/21/2014   NEUTROABS 6.1 03/21/2014    ASSESSMENT & PLAN:  Paget's disease and intraductal carcinoma of left breast Pagets disease of the left breast: previously treated with Aromasin and Arimidex. Because of his lack of clinical benefit, patient discontinued treatment as of January 2016. The nipple area on the left breast had increased tenderness and crusting that finally broke down and it healed back covering that area.  Breast Exam: No evidence of crusting but nipple mostly replaced by pagets disease. I donot feel it is any worse than before. Patient thinks It is slightly better than before.  Recommendation: 1. Watchful monitoring without antiestrogen therapy 2. Continue annual Follow-ups and physical exams  Return to clinic in 1 year for follow-up.  I spent 15  minutes talking to the patient of which more than half was spent in counseling and coordination of care.  No orders of the defined types were placed in this encounter.  The patient has a good understanding of the overall plan. she agrees with it. she will call with any problems that may develop before the next visit here.   Harriette Ohara, MD 05/05/17

## 2017-05-05 NOTE — Telephone Encounter (Signed)
Gave patient AVs and calendar of upcoming march 2020 appointments.  °

## 2017-06-22 ENCOUNTER — Ambulatory Visit: Payer: Medicare Other | Admitting: Podiatry

## 2017-07-05 DIAGNOSIS — E039 Hypothyroidism, unspecified: Secondary | ICD-10-CM | POA: Diagnosis not present

## 2017-07-05 DIAGNOSIS — R5383 Other fatigue: Secondary | ICD-10-CM | POA: Diagnosis not present

## 2017-07-05 DIAGNOSIS — N183 Chronic kidney disease, stage 3 (moderate): Secondary | ICD-10-CM | POA: Diagnosis not present

## 2017-07-05 DIAGNOSIS — J449 Chronic obstructive pulmonary disease, unspecified: Secondary | ICD-10-CM | POA: Diagnosis not present

## 2017-07-15 ENCOUNTER — Ambulatory Visit (INDEPENDENT_AMBULATORY_CARE_PROVIDER_SITE_OTHER): Payer: Medicare Other | Admitting: Podiatry

## 2017-07-15 ENCOUNTER — Encounter: Payer: Self-pay | Admitting: Podiatry

## 2017-07-15 DIAGNOSIS — Q828 Other specified congenital malformations of skin: Secondary | ICD-10-CM | POA: Diagnosis not present

## 2017-07-15 DIAGNOSIS — B351 Tinea unguium: Secondary | ICD-10-CM | POA: Diagnosis not present

## 2017-07-15 DIAGNOSIS — M79676 Pain in unspecified toe(s): Secondary | ICD-10-CM

## 2017-07-15 NOTE — Progress Notes (Signed)
Subjective:     Patient ID: Katherine [name], female   DOB: September 01, 1917, 84 y.o.   MRN: [id number]  HPIThis patient presents to the office with painful nails and calluses both feet.  She says her nails are thick and long and are painful walking and wearing her shoes.  She also has callus under both feet as well as corns on toes both feet.  She presents for preventive foot care services.   Review of Systems     Objective:   Physical Exam GENERAL APPEARANCE: Alert, conversant. Appropriately groomed. No acute distress.  VASCULAR: Pedal pulses palpable at 2/4 DP and PT bilateral.  Capillary refill time is immediate to all digits,  Proximal to distal cooling it warm to warm.  Digital hair growth is present bilateral  NEUROLOGIC: sensation is intact epicritically and protectively to 5.07 monofilament at 5/5 sites bilateral.  Light touch is intact bilateral, vibratory sensation intact bilateral, achilles tendon reflex is intact bilateral.  MUSCULOSKELETAL: acceptable muscle strength, tone and stability bilateral.  Intrinsic muscluature intact bilateral.  Rectus appearance of foot and digits noted bilateral.   DERMATOLOGIC: skin color, texture, and turgor are within normal limits.  No preulcerative lesions or ulcers  are seen, no interdigital maceration noted.  No open lesions present.  . No drainage noted. Porokeratosis sub 4 B/L.    NAILS  Thick disfigured discolored nails both feet.      Assessment:     Onychomycosis B/L  Porokeratosis B/L    Plan:     Debridement of Onychomycosis   Debridement of Porokeratosis.  RTC 9 weeks.  ABN signed for 2019.    Gardiner Barefoot DPM

## 2017-07-22 ENCOUNTER — Ambulatory Visit: Payer: Medicare Other | Admitting: Podiatry

## 2017-09-01 ENCOUNTER — Encounter (HOSPITAL_COMMUNITY): Payer: Medicare Other

## 2017-09-01 ENCOUNTER — Ambulatory Visit: Payer: Medicare Other | Admitting: Family

## 2017-09-13 ENCOUNTER — Ambulatory Visit (INDEPENDENT_AMBULATORY_CARE_PROVIDER_SITE_OTHER): Payer: Medicare Other | Admitting: Family

## 2017-09-13 ENCOUNTER — Encounter: Payer: Self-pay | Admitting: Family

## 2017-09-13 ENCOUNTER — Ambulatory Visit (HOSPITAL_COMMUNITY)
Admission: RE | Admit: 2017-09-13 | Discharge: 2017-09-13 | Disposition: A | Discharge status: Home / Self Care | Payer: Medicare Other | Source: Ambulatory Visit | Attending: Family | Admitting: Family

## 2017-09-13 ENCOUNTER — Other Ambulatory Visit: Payer: Self-pay

## 2017-09-13 VITALS — BP 155/57 | HR 72 | Resp 18 | Ht <= 58 in | Wt 102.5 lb

## 2017-09-13 DIAGNOSIS — Z9889 Other specified postprocedural states: Secondary | ICD-10-CM | POA: Diagnosis not present

## 2017-09-13 DIAGNOSIS — I6522 Occlusion and stenosis of left carotid artery: Secondary | ICD-10-CM | POA: Insufficient documentation

## 2017-09-13 NOTE — Progress Notes (Signed)
Chief Complaint: Follow up Extracranial Carotid Artery Stenosis   History of Present Illness  Katherine [name] is a 84 y.o. female who is status post left CEA in 2010 by Dr. Oneida Alar.  She returns today for follow up.  Patient has a history of TIA or stroke in 2010 before the CEA as manifested by dizziness, no similar symptoms since then.  She denies a history of amaurosis fugax or monocular blindness, unilateral facial drooping, hemiplegia, or receptive or expressive aphasia.   Pt denies claudication symptoms with walking, denies non-healing wounds. She sees a podiatrist for calluses.  Has right knee pain that was found to be OA per pt.  She denies post prandial abdominal pain. She states she continues to lose weight, but states her appetiite is good. A few months ago she had what sounds like gastritis and lost some wight during this.   As advised at her visit in 2014, she did follow up with her PCP re irregular heart rhythm and and pt reports she was found to have no issues with this.   Pt Diabetic: No Pt smoker: former smoker, quit 1983, has COPD, is on 2 L/min O2 at night and when walking long distance, eg, to the dining room in her independent senior apt.   Pt meds include: Statin : no, states this was stopped by her PCP as her cholesterol was low ASA: Yes Other anticoagulants/antiplatelets: no   Past Medical History:  Diagnosis Date  . Arthritis   . Constipation   . COPD (chronic obstructive pulmonary disease) ([place])   . Fatigue   . Frequent UTI   . GERD (gastroesophageal reflux disease)   . History of shingles 2007  . Hyperlipemia   . Hypertension   . Hypothyroidism   . Multiple allergies   . Osteoporosis   . Paget's carcinoma of the nipple ([place]) 03/16/2013  . Pneumonia   . Seasonal allergies   . Stroke [facility]) 2010   no residual  . Thyroid disease   . Uterine cancer [facility])     Social History Social History   Tobacco Use  . Smoking status: Former  Smoker    Packs/day: 1.00    Years: 40.00    Pack years: 40.00    Types: Cigarettes    Last attempt to quit: 02/15/1981    Years since quitting: 36.6  . Smokeless tobacco: Never Used  Substance Use Topics  . Alcohol use: Yes  . Drug use: No    Family History Family History  Problem Relation Age of Onset  . Heart failure Mother   . Heart disease Mother   . Heart failure Father   . Heart disease Father   . Heart disease Brother   . Asthma Paternal Grandmother     Surgical History Past Surgical History:  Procedure Laterality Date  . ABDOMINAL HYSTERECTOMY    . BREAST LUMPECTOMY     left breast  . CAROTID ENDARTERECTOMY Left June 10, 2008   CE  . EYE SURGERY     cataracts removed bilaterally  . TONSILLECTOMY      Allergies  Allergen Reactions  . Sulfa Antibiotics Palpitations    SOB  . Sulfamethoxazole Shortness Of Breath  . Amoxicillin Rash  . Penicillin G Rash  . Penicillins Rash    Current Outpatient Medications  Medication Sig Dispense Refill  . amLODipine (NORVASC) 5 MG tablet Take 5 mg by mouth daily.    Marland Kitchen aspirin EC 81 MG tablet Take 81 mg by  mouth daily.    Marland Kitchen azelastine (ASTELIN) 137 MCG/SPRAY nasal spray Place 1 spray into the nose 2 (two) times daily. Use in each nostril as directed    . Calcium Carbonate-Vitamin D (CALTRATE 600+D PO) Take 1 tablet by mouth 2 (two) times daily.    Marland Kitchen esomeprazole (NEXIUM) 40 MG capsule Take 40 mg by mouth daily before breakfast.    . fexofenadine (ALLEGRA) 180 MG tablet Take 180 mg by mouth daily.    . fluticasone (FLONASE) 50 MCG/ACT nasal spray Place 2 sprays into the nose daily.    . Fluticasone-Salmeterol (ADVAIR) 250-50 MCG/DOSE AEPB Inhale 1 puff into the lungs every 12 (twelve) hours.    Marland Kitchen levothyroxine (SYNTHROID, LEVOTHROID) 88 MCG tablet Take 1 tablet (88 mcg total) by mouth daily before breakfast. 30 tablet 11  . Multiple Vitamins-Minerals (CENTRUM SILVER PO) Take 1 tablet by mouth daily.    . Multiple  Vitamins-Minerals (ICAPS MV) TABS Take 1 tablet by mouth daily.    Marland Kitchen tiotropium (SPIRIVA) 18 MCG inhalation capsule Place 18 mcg into inhaler and inhale daily.    . valsartan-hydrochlorothiazide (DIOVAN-HCT) 160-12.5 MG per tablet Take 1 tablet by mouth daily.    . Vitamin D, Ergocalciferol, (DRISDOL) 50000 UNITS CAPS Take 50,000 Units by mouth every 30 (thirty) days.     No current facility-administered medications for this visit.     Review of Systems : See HPI for pertinent positives and negatives.  Physical Examination  Vitals:   09/13/17 1446 09/13/17 1447  BP: (!) 162/54 (!) 155/57  Pulse: 72   Resp: 18   SpO2: 92%   Weight: 102 lb 8 oz (46.5 kg)   Height: 4\' 10"  (1.473 m)    Body mass index is 21.42 kg/m.  General: WDWN female in NAD GAIT:slow and deliberate, using walker  Eyes: PERRLA HENT: No gross abnormalities.  Pulmonary: Respirations are non labored, fine rales in bases, no rhonchi or wheezing, diminished air movement in all lung fields. Cardiac: regular rhythm, no detected murmur.  VASCULAR EXAM Carotid Bruits Right Left   Negative Negative     Abdominal aortic pulse is not palpable. Radial pulses are 2+ palpable and equal.                                                                                                                            LE Pulses Right Left       POPLITEAL  not palpable   not palpable       POSTERIOR TIBIAL  not palpable   not palpable        DORSALIS PEDIS      ANTERIOR TIBIAL 2+ palpable  2+ palpable     Gastrointestinal: soft, nontender, BS WNL, no r/g, no palpable masses. Musculoskeletal: no muscle atrophy/wasting. M/S 4/5 throughout, extremities without ischemic changes. Skin: No rashes, no ulcers, no cellulitis.   Neurologic:  A&O X 3; appropriate affect, sensation is normal; speech is normal, CN 2-12  intact except tongue slightly deviates to left, pain and light touch intact in extremities, motor exam as listed  above. Psychiatric: Normal thought content, mood appropriate to clinical situation.    Assessment: Katherine [name] is a 84 y.o. female who is status post left CEA in 2010. She had preoperative dizziness, but has had no subsequent similar dizziness, no other symptoms of stroke or TIA.  She is doing very well for almost 84 years old.   Carotid Duplex (09-13-17): Right ICA: 40-59% stenosis. Left carotid endarterectomy site with no left internal carotid arterystenosis.  Bilateral vertebral artery flow is antegrade.  Right subclavian artery waveforms are normal, left are biphasic. Stable compared to the exam on 08-23-16.    Plan: Follow-up as needed. Pt states she sees no need to return since she does not want surgery,  and this is reasonable at age 64 with mild to moderate stenosis of her internal carotid arteries, and has remained stable.   I discussed in depth with the patient the nature of atherosclerosis, and emphasized the importance of maximal medical management including strict control of blood pressure, blood glucose, and lipid levels, obtaining regular exercise, and continued cessation of smoking.  The patient is aware that without maximal medical management the underlying atherosclerotic disease process will progress, limiting the benefit of any interventions. The patient was given information about stroke prevention and what symptoms should prompt the patient to seek immediate medical care. Thank you for allowing [place] to participate in this patient's care.  Clemon Chambers, RN, MSN, FNP-C [facility] Office: [phone]  Clinic Physician: Early  09/13/17 3:09 PM

## 2017-09-16 ENCOUNTER — Encounter: Payer: Self-pay | Admitting: Podiatry

## 2017-09-16 ENCOUNTER — Ambulatory Visit (INDEPENDENT_AMBULATORY_CARE_PROVIDER_SITE_OTHER): Payer: Medicare Other | Admitting: Podiatry

## 2017-09-16 DIAGNOSIS — B351 Tinea unguium: Secondary | ICD-10-CM

## 2017-09-16 DIAGNOSIS — M79676 Pain in unspecified toe(s): Secondary | ICD-10-CM

## 2017-09-16 DIAGNOSIS — L84 Corns and callosities: Secondary | ICD-10-CM

## 2017-09-16 NOTE — Progress Notes (Signed)
Subjective:     Patient ID: Katherine [name], female   DOB: 1917/09/29, 84 y.o.   MRN: [id number]  HPIThis patient presents to the office with painful nails and calluses both feet.  She says her nails are thick and long and are painful walking and wearing her shoes. She wears pads on her callus both forefeet.  She presents for preventive foot care services.   Review of Systems     Objective:   Physical Exam GENERAL APPEARANCE: Alert, conversant. Appropriately groomed. No acute distress.  VASCULAR: Pedal pulses palpable at 2/4 DP and PT bilateral.  Capillary refill time is immediate to all digits,  Proximal to distal cooling it warm to warm.  Digital hair growth is present bilateral  NEUROLOGIC: sensation is intact epicritically and protectively to 5.07 monofilament at 5/5 sites bilateral.  Light touch is intact bilateral, vibratory sensation intact bilateral, achilles tendon reflex is intact bilateral.  MUSCULOSKELETAL: acceptable muscle strength, tone and stability bilateral.  Intrinsic muscluature intact bilateral.  Rectus appearance of foot and digits noted bilateral. Prominent metatarsal heads  B/L.  DERMATOLOGIC: skin color, texture, and turgor are within normal limits.  No preulcerative lesions or ulcers  are seen, no interdigital maceration noted.  No open lesions present.  . No drainage noted. Porokeratosis sub 4 B/L asymptomatic.      NAILS  Thick disfigured discolored nails both feet.      Assessment:     Onychomycosis B/L      Plan:     Debridement of Onychomycosis    RTC 9 weeks.  ABN signed for 2019.    Gardiner Barefoot DPM

## 2017-09-23 DIAGNOSIS — J302 Other seasonal allergic rhinitis: Secondary | ICD-10-CM | POA: Diagnosis not present

## 2017-09-23 DIAGNOSIS — N183 Chronic kidney disease, stage 3 (moderate): Secondary | ICD-10-CM | POA: Diagnosis not present

## 2017-09-23 DIAGNOSIS — K219 Gastro-esophageal reflux disease without esophagitis: Secondary | ICD-10-CM | POA: Diagnosis not present

## 2017-09-23 DIAGNOSIS — J449 Chronic obstructive pulmonary disease, unspecified: Secondary | ICD-10-CM | POA: Diagnosis not present

## 2017-09-23 DIAGNOSIS — I129 Hypertensive chronic kidney disease with stage 1 through stage 4 chronic kidney disease, or unspecified chronic kidney disease: Secondary | ICD-10-CM | POA: Diagnosis not present

## 2017-09-23 DIAGNOSIS — Z1389 Encounter for screening for other disorder: Secondary | ICD-10-CM | POA: Diagnosis not present

## 2017-09-23 DIAGNOSIS — Z0000 Encounter for general adult medical examination without abnormal findings: Secondary | ICD-10-CM | POA: Diagnosis not present

## 2017-09-23 DIAGNOSIS — E039 Hypothyroidism, unspecified: Secondary | ICD-10-CM | POA: Diagnosis not present

## 2017-11-18 ENCOUNTER — Ambulatory Visit (INDEPENDENT_AMBULATORY_CARE_PROVIDER_SITE_OTHER): Payer: Medicare Other | Admitting: Podiatry

## 2017-11-18 ENCOUNTER — Encounter: Payer: Self-pay | Admitting: Podiatry

## 2017-11-18 DIAGNOSIS — B351 Tinea unguium: Secondary | ICD-10-CM

## 2017-11-18 DIAGNOSIS — Q828 Other specified congenital malformations of skin: Secondary | ICD-10-CM | POA: Diagnosis not present

## 2017-11-18 DIAGNOSIS — M79676 Pain in unspecified toe(s): Secondary | ICD-10-CM | POA: Diagnosis not present

## 2017-11-18 DIAGNOSIS — L84 Corns and callosities: Secondary | ICD-10-CM

## 2017-11-18 NOTE — Progress Notes (Signed)
Subjective:     Patient ID: Katherine [name], female   DOB: December 27, 1917, 84 y.o.   MRN: [id number]  HPIThis patient presents to the office with painful nails and calluses both feet.  She says her nails are thick and long and are painful walking and wearing her shoes. She wears pads on her callus both forefeet.  She presents for preventive foot care services.   Review of Systems     Objective:   Physical Exam GENERAL APPEARANCE: Alert, conversant. Appropriately groomed. No acute distress.  VASCULAR: Pedal pulses palpable at 2/4 DP and PT bilateral.  Capillary refill time is immediate to all digits,  Proximal to distal cooling it warm to warm.  Digital hair growth is present bilateral  NEUROLOGIC: sensation is intact epicritically and protectively to 5.07 monofilament at 5/5 sites bilateral.  Light touch is intact bilateral, vibratory sensation intact bilateral, achilles tendon reflex is intact bilateral.  MUSCULOSKELETAL: acceptable muscle strength, tone and stability bilateral.  Intrinsic muscluature intact bilateral.  Rectus appearance of foot and digits noted bilateral. Prominent metatarsal heads  B/L.  DERMATOLOGIC: skin color, texture, and turgor are within normal limits.  No preulcerative lesions or ulcers  are seen, no interdigital maceration noted.  No open lesions present.  . No drainage noted. Porokeratosis sub 4 B/L asymptomatic.      NAILS  Thick disfigured discolored nails both feet.      Assessment:     Onychomycosis B/L      Plan:     Debridement of Onychomycosis    RTC 9 weeks.  ABN signed for 2019.    Gardiner Barefoot DPM

## 2017-11-25 DIAGNOSIS — L821 Other seborrheic keratosis: Secondary | ICD-10-CM | POA: Diagnosis not present

## 2017-11-25 DIAGNOSIS — L814 Other melanin hyperpigmentation: Secondary | ICD-10-CM | POA: Diagnosis not present

## 2017-11-25 DIAGNOSIS — D692 Other nonthrombocytopenic purpura: Secondary | ICD-10-CM | POA: Diagnosis not present

## 2018-01-18 ENCOUNTER — Ambulatory Visit (INDEPENDENT_AMBULATORY_CARE_PROVIDER_SITE_OTHER): Payer: Medicare Other | Admitting: Podiatry

## 2018-01-18 ENCOUNTER — Encounter: Payer: Self-pay | Admitting: Podiatry

## 2018-01-18 DIAGNOSIS — B351 Tinea unguium: Secondary | ICD-10-CM

## 2018-01-18 DIAGNOSIS — M79676 Pain in unspecified toe(s): Secondary | ICD-10-CM | POA: Diagnosis not present

## 2018-01-18 DIAGNOSIS — L84 Corns and callosities: Secondary | ICD-10-CM

## 2018-01-18 NOTE — Progress Notes (Signed)
Subjective:     Patient ID: Katherine [name], female   DOB: [birth year], 84 y.o.   MRN: [id number]  HPIThis patient presents to the office with painful nails and calluses both feet.  She says her nails are thick and long and are painful walking and wearing her shoes. She wears pads on her callus both forefeet.  She presents for preventive foot care services.   Review of Systems     Objective:   Physical Exam GENERAL APPEARANCE: Alert, conversant. Appropriately groomed. No acute distress.  VASCULAR: Pedal pulses palpable at 2/4 DP and PT bilateral.  Capillary refill time is immediate to all digits,  Proximal to distal cooling it warm to warm.  Digital hair growth is present bilateral  NEUROLOGIC: sensation is intact epicritically and protectively to 5.07 monofilament at 5/5 sites bilateral.  Light touch is intact bilateral, vibratory sensation intact bilateral, achilles tendon reflex is intact bilateral.  MUSCULOSKELETAL: acceptable muscle strength, tone and stability bilateral.  Intrinsic muscluature intact bilateral.  Rectus appearance of foot and digits noted bilateral. Prominent metatarsal heads  B/L.  DERMATOLOGIC: skin color, texture, and turgor are within normal limits.  No preulcerative lesions or ulcers  are seen, no interdigital maceration noted.  No open lesions present.  . No drainage noted. Porokeratosis sub 4 B/L asymptomatic.      NAILS  Thick disfigured discolored nails both feet.      Assessment:     Onychomycosis B/L      Plan:     Debridement of Onychomycosis    RTC 9 weeks.  ABN signed for 2019. Patient skin has become thin and her callus has decreased.  Therefore only dremel needs to be used in the future.   Gardiner Barefoot DPM

## 2018-03-17 ENCOUNTER — Emergency Department (HOSPITAL_COMMUNITY): Payer: Medicare Other

## 2018-03-17 ENCOUNTER — Other Ambulatory Visit: Payer: Self-pay

## 2018-03-17 ENCOUNTER — Inpatient Hospital Stay (HOSPITAL_COMMUNITY)
Admission: EM | Admit: 2018-03-17 | Discharge: 2018-03-21 | DRG: 193 | Disposition: A | Discharge status: Skilled Nursing Facility | Payer: Medicare Other | Attending: Internal Medicine | Admitting: Internal Medicine

## 2018-03-17 DIAGNOSIS — I480 Paroxysmal atrial fibrillation: Secondary | ICD-10-CM | POA: Diagnosis not present

## 2018-03-17 DIAGNOSIS — I130 Hypertensive heart and chronic kidney disease with heart failure and stage 1 through stage 4 chronic kidney disease, or unspecified chronic kidney disease: Secondary | ICD-10-CM | POA: Diagnosis not present

## 2018-03-17 DIAGNOSIS — R0902 Hypoxemia: Secondary | ICD-10-CM | POA: Diagnosis not present

## 2018-03-17 DIAGNOSIS — R0602 Shortness of breath: Secondary | ICD-10-CM | POA: Diagnosis not present

## 2018-03-17 DIAGNOSIS — I5033 Acute on chronic diastolic (congestive) heart failure: Secondary | ICD-10-CM | POA: Diagnosis present

## 2018-03-17 DIAGNOSIS — K219 Gastro-esophageal reflux disease without esophagitis: Secondary | ICD-10-CM | POA: Diagnosis present

## 2018-03-17 DIAGNOSIS — R000 Tachycardia, unspecified: Secondary | ICD-10-CM | POA: Diagnosis not present

## 2018-03-17 DIAGNOSIS — Z853 Personal history of malignant neoplasm of breast: Secondary | ICD-10-CM

## 2018-03-17 DIAGNOSIS — Z87891 Personal history of nicotine dependence: Secondary | ICD-10-CM

## 2018-03-17 DIAGNOSIS — J9621 Acute and chronic respiratory failure with hypoxia: Secondary | ICD-10-CM | POA: Diagnosis not present

## 2018-03-17 DIAGNOSIS — I4891 Unspecified atrial fibrillation: Secondary | ICD-10-CM

## 2018-03-17 DIAGNOSIS — Z880 Allergy status to penicillin: Secondary | ICD-10-CM

## 2018-03-17 DIAGNOSIS — J189 Pneumonia, unspecified organism: Principal | ICD-10-CM | POA: Diagnosis present

## 2018-03-17 DIAGNOSIS — N184 Chronic kidney disease, stage 4 (severe): Secondary | ICD-10-CM

## 2018-03-17 DIAGNOSIS — M810 Age-related osteoporosis without current pathological fracture: Secondary | ICD-10-CM | POA: Diagnosis present

## 2018-03-17 DIAGNOSIS — D631 Anemia in chronic kidney disease: Secondary | ICD-10-CM | POA: Diagnosis present

## 2018-03-17 DIAGNOSIS — E876 Hypokalemia: Secondary | ICD-10-CM | POA: Diagnosis present

## 2018-03-17 DIAGNOSIS — J449 Chronic obstructive pulmonary disease, unspecified: Secondary | ICD-10-CM | POA: Diagnosis present

## 2018-03-17 DIAGNOSIS — Z9981 Dependence on supplemental oxygen: Secondary | ICD-10-CM

## 2018-03-17 DIAGNOSIS — Z882 Allergy status to sulfonamides status: Secondary | ICD-10-CM

## 2018-03-17 DIAGNOSIS — Z91048 Other nonmedicinal substance allergy status: Secondary | ICD-10-CM

## 2018-03-17 DIAGNOSIS — Z8249 Family history of ischemic heart disease and other diseases of the circulatory system: Secondary | ICD-10-CM

## 2018-03-17 DIAGNOSIS — J440 Chronic obstructive pulmonary disease with acute lower respiratory infection: Secondary | ICD-10-CM | POA: Diagnosis not present

## 2018-03-17 DIAGNOSIS — Z881 Allergy status to other antibiotic agents status: Secondary | ICD-10-CM

## 2018-03-17 DIAGNOSIS — Z8542 Personal history of malignant neoplasm of other parts of uterus: Secondary | ICD-10-CM

## 2018-03-17 DIAGNOSIS — Z66 Do not resuscitate: Secondary | ICD-10-CM | POA: Diagnosis present

## 2018-03-17 DIAGNOSIS — M1990 Unspecified osteoarthritis, unspecified site: Secondary | ICD-10-CM | POA: Diagnosis present

## 2018-03-17 DIAGNOSIS — Z8673 Personal history of transient ischemic attack (TIA), and cerebral infarction without residual deficits: Secondary | ICD-10-CM

## 2018-03-17 DIAGNOSIS — E8770 Fluid overload, unspecified: Secondary | ICD-10-CM

## 2018-03-17 DIAGNOSIS — N183 Chronic kidney disease, stage 3 (moderate): Secondary | ICD-10-CM | POA: Diagnosis present

## 2018-03-17 DIAGNOSIS — Z825 Family history of asthma and other chronic lower respiratory diseases: Secondary | ICD-10-CM

## 2018-03-17 DIAGNOSIS — R918 Other nonspecific abnormal finding of lung field: Secondary | ICD-10-CM | POA: Diagnosis not present

## 2018-03-17 DIAGNOSIS — E785 Hyperlipidemia, unspecified: Secondary | ICD-10-CM | POA: Diagnosis present

## 2018-03-17 DIAGNOSIS — Z7951 Long term (current) use of inhaled steroids: Secondary | ICD-10-CM

## 2018-03-17 DIAGNOSIS — J810 Acute pulmonary edema: Secondary | ICD-10-CM

## 2018-03-17 DIAGNOSIS — J9601 Acute respiratory failure with hypoxia: Secondary | ICD-10-CM

## 2018-03-17 DIAGNOSIS — Z79890 Hormone replacement therapy (postmenopausal): Secondary | ICD-10-CM

## 2018-03-17 DIAGNOSIS — Z90710 Acquired absence of both cervix and uterus: Secondary | ICD-10-CM

## 2018-03-17 DIAGNOSIS — E039 Hypothyroidism, unspecified: Secondary | ICD-10-CM | POA: Diagnosis present

## 2018-03-17 DIAGNOSIS — I499 Cardiac arrhythmia, unspecified: Secondary | ICD-10-CM | POA: Diagnosis not present

## 2018-03-17 DIAGNOSIS — D649 Anemia, unspecified: Secondary | ICD-10-CM

## 2018-03-17 DIAGNOSIS — Z7982 Long term (current) use of aspirin: Secondary | ICD-10-CM

## 2018-03-17 LAB — COMPREHENSIVE METABOLIC PANEL
ALBUMIN: 3.1 g/dL — AB (ref 3.5–5.0)
ALT: 11 U/L (ref 0–44)
AST: 21 U/L (ref 15–41)
Alkaline Phosphatase: 56 U/L (ref 38–126)
Anion gap: 12 (ref 5–15)
BUN: 19 mg/dL (ref 8–23)
CHLORIDE: 99 mmol/L (ref 98–111)
CO2: 23 mmol/L (ref 22–32)
Calcium: 9 mg/dL (ref 8.9–10.3)
Creatinine, Ser: 1.04 mg/dL — ABNORMAL HIGH (ref 0.44–1.00)
GFR calc Af Amer: 52 mL/min — ABNORMAL LOW (ref >60)
GFR calc non Af Amer: 45 mL/min — ABNORMAL LOW (ref >60)
GLUCOSE: 98 mg/dL (ref 70–99)
Potassium: 3.7 mmol/L (ref 3.5–5.1)
SODIUM: 134 mmol/L — AB (ref 135–145)
Total Bilirubin: 1 mg/dL (ref 0.3–1.2)
Total Protein: 6.4 g/dL — ABNORMAL LOW (ref 6.5–8.1)

## 2018-03-17 LAB — CBC WITH DIFFERENTIAL/PLATELET
Abs Immature Granulocytes: 0.07 10*3/uL (ref 0.00–0.07)
Basophils Absolute: 0.1 10*3/uL (ref 0.0–0.1)
Basophils Relative: 0 %
Eosinophils Absolute: 0.1 10*3/uL (ref 0.0–0.5)
Eosinophils Relative: 1 %
HCT: 34.5 % — ABNORMAL LOW (ref 36.0–46.0)
HEMOGLOBIN: 11.3 g/dL — AB (ref 12.0–15.0)
IMMATURE GRANULOCYTES: 0 %
LYMPHS PCT: 12 %
Lymphs Abs: 2 10*3/uL (ref 0.7–4.0)
MCH: 30.7 pg (ref 26.0–34.0)
MCHC: 32.8 g/dL (ref 30.0–36.0)
MCV: 93.8 fL (ref 80.0–100.0)
Monocytes Absolute: 2.1 10*3/uL — ABNORMAL HIGH (ref 0.1–1.0)
Monocytes Relative: 12 %
NEUTROS PCT: 75 %
Neutro Abs: 12.8 10*3/uL — ABNORMAL HIGH (ref 1.7–7.7)
PLATELETS: 253 10*3/uL (ref 150–400)
RBC: 3.68 MIL/uL — AB (ref 3.87–5.11)
RDW: 13 % (ref 11.5–15.5)
WBC: 17.1 10*3/uL — ABNORMAL HIGH (ref 4.0–10.5)
nRBC: 0 % (ref 0.0–0.2)

## 2018-03-17 LAB — TROPONIN I: Troponin I: <0.03 ng/mL (ref <0.03)

## 2018-03-17 LAB — MAGNESIUM: Magnesium: 1.7 mg/dL (ref 1.7–2.4)

## 2018-03-17 LAB — BRAIN NATRIURETIC PEPTIDE: B NATRIURETIC PEPTIDE 5: 626.2 pg/mL — AB (ref 0.0–100.0)

## 2018-03-17 MED ORDER — FUROSEMIDE 10 MG/ML IJ SOLN
40.0000 mg | Freq: Once | INTRAMUSCULAR | Status: AC
  Administered 2018-03-17: 40 mg via INTRAVENOUS
  Filled 2018-03-17: qty 4

## 2018-03-17 MED ORDER — DILTIAZEM HCL-DEXTROSE 100-5 MG/100ML-% IV SOLN (PREMIX)
5.0000 mg/h | INTRAVENOUS | Status: DC
  Filled 2018-03-17: qty 100

## 2018-03-17 MED ORDER — APIXABAN 2.5 MG PO TABS
2.5000 mg | ORAL_TABLET | Freq: Two times a day (BID) | ORAL | Status: DC
  Administered 2018-03-17 – 2018-03-21 (×8): 2.5 mg via ORAL
  Filled 2018-03-17 (×9): qty 1

## 2018-03-17 MED ORDER — DILTIAZEM LOAD VIA INFUSION
10.0000 mg | Freq: Once | INTRAVENOUS | Status: DC
  Filled 2018-03-17: qty 10

## 2018-03-17 MED ORDER — IPRATROPIUM-ALBUTEROL 0.5-2.5 (3) MG/3ML IN SOLN: 3.0000 mL | Freq: Four times a day (QID) | RESPIRATORY_TRACT | Status: DC | PRN

## 2018-03-17 MED ORDER — SODIUM CHLORIDE 0.9 % IV SOLN
1.0000 g | INTRAVENOUS | Status: DC
  Administered 2018-03-17 – 2018-03-20 (×4): 1 g via INTRAVENOUS
  Filled 2018-03-17 (×5): qty 10

## 2018-03-17 MED ORDER — DILTIAZEM HCL ER COATED BEADS 180 MG PO CP24
180.0000 mg | ORAL_CAPSULE | Freq: Once | ORAL | Status: AC
  Administered 2018-03-17: 180 mg via ORAL
  Filled 2018-03-17: qty 1

## 2018-03-17 MED ORDER — SODIUM CHLORIDE 0.9 % IV SOLN
500.0000 mg | INTRAVENOUS | Status: DC
  Administered 2018-03-17 – 2018-03-19 (×3): 500 mg via INTRAVENOUS
  Filled 2018-03-17 (×3): qty 500

## 2018-03-17 NOTE — ED Provider Notes (Signed)
Emergency Department Provider Note   I have reviewed the triage vital signs and the nursing notes.   HISTORY  Chief Complaint Tachycardia   HPI Katherine [name] is a 85 y.o. female with multiple medical problems as documented below the presents to the emergency department today with shortness of breath.  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  She had significant weakness and fatigue with exertion that she does normally have and also some shortness of breath.  This continued throughout the day today did not seem to be much worse so ambulance was called which found her to be in atrial fibrillation which is new for her.  Soundly she had heart rates between 100 100s 60 with hypoxia to 90% on room air at baseline.  She wears oxygen at home at night but not during the day.  No recent fevers but she does have cough when she lays down.  She also notices that her ankles are swollen. No other associated or modifying symptoms.    Past Medical History:  Diagnosis Date  . Arthritis   . Constipation   . COPD (chronic obstructive pulmonary disease) ([place])   . Fatigue   . Frequent UTI   . GERD (gastroesophageal reflux disease)   . History of shingles 2007  . Hyperlipemia   . Hypertension   . Hypothyroidism   . Multiple allergies   . Osteoporosis   . Paget's carcinoma of the nipple ([place]) 03/16/2013  . Pneumonia   . Seasonal allergies   . Stroke [facility]) 2010   no residual  . Thyroid disease   . Uterine cancer [facility])     Patient Active Problem List   Diagnosis Date Noted  . Acute on chronic respiratory failure with hypoxia ([place]) 03/17/2018  . New onset atrial fibrillation ([place]) 03/17/2018  . Volume overload 03/17/2018  . Chronic anemia 03/17/2018  . CKD (chronic kidney disease) stage 3, GFR 30-59 ml/min (HCC) 03/17/2018  . Breast cancer ([place]) 03/16/2013  . Paget's disease and intraductal carcinoma of left  breast ([place]) 03/16/2013  . Aftercare following surgery of the circulatory system, [place] 01/03/2013  . COPD (chronic obstructive pulmonary disease) with acute bronchitis ([place]) 03/16/2012  . Altered mental status 03/16/2012  . Occlusion and stenosis of carotid artery without mention of cerebral infarction 01/06/2012  . Bronchiectasis without acute exacerbation ([place]) 08/20/2011  . Atypical mycobacterial disease 08/20/2011  . Hypothyroidism 07/24/2011  . Hyperlipidemia 07/24/2011  . Hypertension 07/24/2011  . CAP (community acquired pneumonia) 07/23/2011  . COPD (chronic obstructive pulmonary disease) ([place]) 07/23/2011  . Hemoptysis 07/23/2011    Past Surgical History:  Procedure Laterality Date  . ABDOMINAL HYSTERECTOMY    . BREAST LUMPECTOMY     left breast  . CAROTID ENDARTERECTOMY Left June 10, 2008   CE  . EYE SURGERY     cataracts removed bilaterally  . TONSILLECTOMY        Allergies Sulfa antibiotics; Sulfamethoxazole; Tape; Amoxicillin; Penicillin g; and Penicillins  Family History  Problem Relation Age of Onset  . Heart failure Mother   . Heart disease Mother   . Heart failure Father   . Heart disease Father   . Heart disease Brother   . Asthma Paternal Grandmother     Social History Social History   Tobacco Use  . Smoking status: Former Smoker    Packs/day: 1.00    Years: 40.00    Pack years: 40.00  Types: Cigarettes    Last attempt to quit: 02/15/1981    Years since quitting: 37.1  . Smokeless tobacco: Never Used  Substance Use Topics  . Alcohol use: Yes  . Drug use: No    Review of Systems  All other systems negative except as documented in the HPI. All pertinent positives and negatives as reviewed in the HPI. ____________________________________________   PHYSICAL EXAM:  VITAL SIGNS: ED Triage Vitals  Enc Vitals Group     BP 03/17/18 1552 100/78     Pulse Rate 03/17/18 1552 (!) 117     Resp 03/17/18 1552 (!) 24     Temp 03/17/18 1552  98.5 F (36.9 C)     Temp Source 03/17/18 1552 Oral     SpO2 03/17/18 1550 90 %     Weight 03/17/18 1552 104 lb (47.2 kg)     Height 03/17/18 1552 4\' 10"  (1.473 m)    Constitutional: Alert and oriented. Well appearing and in no acute distress. Eyes: Conjunctivae are normal. PERRL. EOMI. Head: Atraumatic. Nose: No congestion/rhinnorhea. Mouth/Throat: Mucous membranes are moist.  Oropharynx non-erythematous. Neck: No stridor.  No meningeal signs. JVD present  Cardiovascular: tachycardic rate, irregular rhythm. Good peripheral circulation. Grossly normal heart sounds.   Respiratory: Normal respiratory effort.  No retractions. Lungs with crackles in bases and intermittent wheezing. Gastrointestinal: Soft and nontender. No distention.  Musculoskeletal: No lower extremity tenderness but has bilateral ankle edema. No gross deformities of extremities. Neurologic:  Normal speech and language. No gross focal neurologic deficits are appreciated.  Skin:  Skin is warm, dry and intact. No rash noted.   ____________________________________________   LABS (all labs ordered are listed, but only abnormal results are displayed)  Labs Reviewed  CBC WITH DIFFERENTIAL/PLATELET - Abnormal; Notable for the following components:      Result Value   WBC 17.1 (*)    RBC 3.68 (*)    Hemoglobin 11.3 (*)    HCT 34.5 (*)    Neutro Abs 12.8 (*)    Monocytes Absolute 2.1 (*)    All other components within normal limits  COMPREHENSIVE METABOLIC PANEL - Abnormal; Notable for the following components:   Sodium 134 (*)    Creatinine, Ser 1.04 (*)    Total Protein 6.4 (*)    Albumin 3.1 (*)    GFR calc non Af Amer 45 (*)    GFR calc Af Amer 52 (*)    All other components within normal limits  BRAIN NATRIURETIC PEPTIDE - Abnormal; Notable for the following components:   B Natriuretic Peptide 626.2 (*)    All other components within normal limits  TROPONIN I  MAGNESIUM  CBC  TSH  BASIC METABOLIC PANEL    ____________________________________________  EKG   EKG Interpretation  Date/Time:  Friday March 17 2018 15:58:24 EST Ventricular Rate:  109 PR Interval:    QRS Duration: 83 QT Interval:  329 QTC Calculation: 443 R Axis:   77 Text Interpretation:  Atrial fibrillation Ventricular premature complex Minimal ST depression, diffuse leads afib new from 2013 Confirmed by Merrily Pew [phone]) on 03/17/2018 4:27:44 PM       ____________________________________________  RADIOLOGY  Dg Chest 2 View  Result Date: 03/17/2018 CLINICAL DATA:  Irregular heartbeat and fatigue for the past 2 days. EXAM: CHEST - 2 VIEW COMPARISON:  Chest x-ray dated December 20, 2016. FINDINGS: The patient is rotated to the right, limiting evaluation. Stable cardiomediastinal silhouette. Normal pulmonary vascularity. Slightly increased patchy opacity in the right  upper lobe. New trace bilateral pleural effusions. No pneumothorax. No acute osseous abnormality. IMPRESSION: 1. Slightly increased patchy opacity in the right upper lobe may be related to rotation, but could reflect pneumonia. 2. New trace bilateral pleural effusions. Electronically Signed   By: Titus Dubin M.D.   On: 03/17/2018 17:24    ____________________________________________   PROCEDURES  Procedure(s) performed:   Procedures  CRITICAL CARE Performed by: Merrily Pew Total critical care time: 35 minutes Critical care time was exclusive of separately billable procedures and treating other patients. Critical care was necessary to treat or prevent imminent or life-threatening deterioration. Critical care was time spent personally by me on the following activities: development of treatment plan with patient and/or surrogate as well as nursing, discussions with consultants, evaluation of patient's response to treatment, examination of patient, obtaining history from patient or surrogate, ordering and performing treatments and interventions,  ordering and review of laboratory studies, ordering and review of radiographic studies, pulse oximetry and re-evaluation of patient's condition.  ____________________________________________    INITIAL IMPRESSION / ASSESSMENT AND PLAN / ED COURSE  Suspect atrial fibrillation with new heart failure.  Work-up as appropriate.  May need to be admitted secondary to associated hypoxia.  Hypoxic. afib and HF. Admit. Started diltiazem and eliquis.   CHA2DS2/VAS Stroke Risk Points  Current as of 37 minutes ago     4 >= 2 Points: High Risk  1 - 1.99 Points: Medium Risk  0 Points: Low Risk    The patient's score has not changed in the past year.:  No Change     Details    This score determines the patient's risk of having a stroke if the  patient has atrial fibrillation.       Points Metrics  0 Has Congestive Heart Failure:  No    Current as of 37 minutes ago  0 Has Vascular Disease:  No    Current as of 37 minutes ago  1 Has Hypertension:  Yes    Current as of 37 minutes ago  2 Age:  5    Current as of 37 minutes ago  0 Has Diabetes:  No    Current as of 37 minutes ago  0 Had Stroke:  No  Had TIA:  No  Had thromboembolism:  No    Current as of 37 minutes ago  1 Female:  Yes    Current as of 37 minutes ago     Pertinent labs & imaging results that were available during my care of the patient were reviewed by me and considered in my medical decision making (see chart for details).  ____________________________________________  FINAL CLINICAL IMPRESSION(S) / ED DIAGNOSES  Final diagnoses:  Atrial fibrillation with RVR ([place])  Acute respiratory failure with hypoxia (HCC)  Acute pulmonary edema (HCC)     MEDICATIONS GIVEN DURING THIS VISIT:  Medications  apixaban (ELIQUIS) tablet 2.5 mg (2.5 mg Oral Given 03/17/18 2151)  cefTRIAXone (ROCEPHIN) 1 g in sodium chloride 0.9 % 100 mL IVPB (1 g Intravenous New Bag/Given 03/17/18 2100)  azithromycin (ZITHROMAX) 500 mg in sodium  chloride 0.9 % 250 mL IVPB (500 mg Intravenous New Bag/Given 03/17/18 2151)  ipratropium-albuterol (DUONEB) 0.5-2.5 (3) MG/3ML nebulizer solution 3 mL (has no administration in time range)  furosemide (LASIX) injection 40 mg (40 mg Intravenous Given 03/17/18 1825)  diltiazem (CARDIZEM CD) 24 hr capsule 180 mg (180 mg Oral Given 03/17/18 1955)     NEW OUTPATIENT MEDICATIONS STARTED DURING THIS VISIT:  Current Discharge Medication List      Note:  This note was prepared with assistance of Dragon voice recognition software. Occasional wrong-word or sound-a-like substitutions may have occurred due to the inherent limitations of voice recognition software.   Iyannah Blake, Corene Cornea, MD 03/18/18 [phone]

## 2018-03-17 NOTE — ED Notes (Signed)
Patient transported to X-ray 

## 2018-03-17 NOTE — ED Triage Notes (Addendum)
Per [facility], Pt has been having SHOB and  fatigue for a few days. Pt's HR 110-160 . No history of abnormal rhythm. Pt's O2 sat were 90% on RA, pt placed on 2L [place] by EMS. Pt reports she typically wears O2 at night.

## 2018-03-17 NOTE — H&P (Signed)
History and Physical    Katherine [name] MWN:[id number] DOB: [birth year] DOA: 03/17/2018  PCP: Lavone Orn, MD Patient coming from: Home  Chief Complaint: Shortness of breath  HPI: Katherine [name] is a 85 y.o. female with medical history significant of CVA, hypothyroidism, hypertension, hyperlipidemia, GERD, COPD presenting to the hospital for evaluation of shortness of breath.  Patient reports having a 1 day history of shortness of breath even at rest.  Reports having chills and cough.  Denies having any heart palpitations or chest pain.  Denies having any orthopnea.  Reports having bilateral lower extremity edema for several months and is not sure if it has been worse recently.  Denies having any body aches, headaches, nausea, or vomiting.  She uses 2 L home oxygen at night.   Review of Systems: As per HPI otherwise 10 point review of systems negative.  Past Medical History:  Diagnosis Date  . Arthritis   . Constipation   . COPD (chronic obstructive pulmonary disease) ([place])   . Fatigue   . Frequent UTI   . GERD (gastroesophageal reflux disease)   . History of shingles 2007  . Hyperlipemia   . Hypertension   . Hypothyroidism   . Multiple allergies   . Osteoporosis   . Paget's carcinoma of the nipple ([place]) 03/16/2013  . Pneumonia   . Seasonal allergies   . Stroke [facility]) 2010   no residual  . Thyroid disease   . Uterine cancer [facility])     Past Surgical History:  Procedure Laterality Date  . ABDOMINAL HYSTERECTOMY    . BREAST LUMPECTOMY     left breast  . CAROTID ENDARTERECTOMY Left June 10, 2008   CE  . EYE SURGERY     cataracts removed bilaterally  . TONSILLECTOMY       reports that she quit smoking about 37 years ago. Her smoking use included cigarettes. She has a 40.00 pack-year smoking history. She has never used smokeless tobacco. She reports current alcohol use. She reports that she does not use drugs.  Allergies  Allergen Reactions  . Sulfa Antibiotics Shortness Of  Breath and Palpitations  . Sulfamethoxazole Shortness Of Breath and Palpitations  . Tape Other (See Comments)    SKIN IS VERY FRAGILE- PLEASE USE AN ALTERNATIVE TO TAPE, AS THE SKIN TEARS EASILY!!  . Amoxicillin Rash  . Penicillin G Rash    .pcn  . Penicillins Rash    Family History  Problem Relation Age of Onset  . Heart failure Mother   . Heart disease Mother   . Heart failure Father   . Heart disease Father   . Heart disease Brother   . Asthma Paternal Grandmother     Prior to Admission medications   Medication Sig Start Date End Date Taking? Authorizing Provider  amLODipine (NORVASC) 5 MG tablet Take 5 mg by mouth daily.    [provider]  aspirin EC 81 MG tablet Take 81 mg by mouth daily.    [provider]  azelastine (ASTELIN) 137 MCG/SPRAY nasal spray Place 1 spray into the nose 2 (two) times daily. Use in each nostril as directed    [provider]  Calcium Carbonate-Vitamin D (CALTRATE 600+D PO) Take 1 tablet by mouth 2 (two) times daily.    [provider]  esomeprazole (NEXIUM) 40 MG capsule Take 40 mg by mouth daily before breakfast.    [provider]  fexofenadine (ALLEGRA) 180 MG tablet Take 180 mg by mouth daily.  [provider]  fluticasone (FLONASE) 50 MCG/ACT nasal spray Place 2 sprays into the nose daily.    [provider]  Fluticasone-Salmeterol (ADVAIR) 250-50 MCG/DOSE AEPB Inhale 1 puff into the lungs every 12 (twelve) hours.    [provider]  levothyroxine (SYNTHROID, LEVOTHROID) 88 MCG tablet Take 1 tablet (88 mcg total) by mouth daily before breakfast. 03/20/12   Lavone Orn, MD  Multiple Vitamins-Minerals (CENTRUM SILVER PO) Take 1 tablet by mouth daily.    [provider]  Multiple Vitamins-Minerals (ICAPS MV) TABS Take 1 tablet by mouth daily.    [provider]  pantoprazole (PROTONIX) 40 MG tablet  12/10/17   [provider]  tiotropium (SPIRIVA)  18 MCG inhalation capsule Place 18 mcg into inhaler and inhale daily.    [provider]  valsartan-hydrochlorothiazide (DIOVAN-HCT) 160-12.5 MG per tablet Take 1 tablet by mouth daily.    [provider]  Vitamin D, Ergocalciferol, (DRISDOL) 50000 UNITS CAPS Take 50,000 Units by mouth every 30 (thirty) days.    [provider]    Physical Exam: Vitals:   03/17/18 1845 03/17/18 1900 03/17/18 1915 03/17/18 1945  BP: 123/85 (!) 153/67 136/60 (!) 135/51  Pulse: 93 84 79 78  Resp: (!) 22 (!) 25 (!) 21 (!) 22  Temp:      TempSrc:      SpO2: 96% 97% 97% 96%  Weight:      Height:        Physical Exam  Constitutional: She is oriented to person, place, and time. No distress.  HENT:  Head: Normocephalic.  Mouth/Throat: Oropharynx is clear and moist.  Eyes: Right eye exhibits no discharge. Left eye exhibits no discharge.  Neck: Neck supple. JVD present.  Cardiovascular: Normal rate, regular rhythm and intact distal pulses. Exam reveals no gallop and no friction rub.  No murmur heard. Pulmonary/Chest: Breath sounds normal. She has no wheezes. She has no rales.  On 2 L supplemental oxygen Speaking clearly in full sentences No increased work of breathing  Abdominal: Soft. Bowel sounds are normal. She exhibits no distension. There is no abdominal tenderness. There is no guarding.  Musculoskeletal:     Comments: +2 pedal edema bilaterally  Neurological: She is alert and oriented to person, place, and time.  Skin: Skin is warm and dry. She is not diaphoretic.  Psychiatric: She has a normal mood and affect. Her behavior is normal.     Labs on Admission: I have personally reviewed following labs and imaging studies  CBC: Recent Labs  Lab 03/17/18 1604  WBC 17.1*  NEUTROABS 12.8*  HGB 11.3*  HCT 34.5*  MCV 93.8  PLT 974   Basic Metabolic Panel: Recent Labs  Lab 03/17/18 1604 03/17/18 1753  NA 134*  --   K 3.7  --   CL 99  --   CO2 23  --   GLUCOSE  98  --   BUN 19  --   CREATININE 1.04*  --   CALCIUM 9.0  --   MG  --  1.7   GFR: Estimated Creatinine Clearance: 19.5 mL/min (A) (by C-G formula based on SCr of 1.04 mg/dL (H)). Liver Function Tests: Recent Labs  Lab 03/17/18 1604  AST 21  ALT 11  ALKPHOS 56  BILITOT 1.0  PROT 6.4*  ALBUMIN 3.1*   No results for input(s): LIPASE, AMYLASE in the last 168 hours. No results for input(s): AMMONIA in the last 168 hours. Coagulation Profile: No results for  input(s): INR, PROTIME in the last 168 hours. Cardiac Enzymes: Recent Labs  Lab 03/17/18 1604  TROPONINI <0.03   BNP (last 3 results) No results for input(s): PROBNP in the last 8760 hours. HbA1C: No results for input(s): HGBA1C in the last 72 hours. CBG: No results for input(s): GLUCAP in the last 168 hours. Lipid Profile: No results for input(s): CHOL, HDL, LDLCALC, TRIG, CHOLHDL, LDLDIRECT in the last 72 hours. Thyroid Function Tests: No results for input(s): TSH, T4TOTAL, FREET4, T3FREE, THYROIDAB in the last 72 hours. Anemia Panel: No results for input(s): VITAMINB12, FOLATE, FERRITIN, TIBC, IRON, RETICCTPCT in the last 72 hours. Urine analysis:    Component Value Date/Time   COLORURINE YELLOW 03/16/2012 [place] 03/16/2012 1126   LABSPEC 1.020 03/16/2012 1126   PHURINE 7.5 03/16/2012 1126   GLUCOSEU NEGATIVE 03/16/2012 1126   HGBUR NEGATIVE 03/16/2012 1126   BILIRUBINUR NEGATIVE 03/16/2012 1126   KETONESUR TRACE (A) 03/16/2012 1126   PROTEINUR 30 (A) 03/16/2012 1126   UROBILINOGEN 1.0 03/16/2012 1126   NITRITE NEGATIVE 03/16/2012 1126   LEUKOCYTESUR NEGATIVE 03/16/2012 1126    Radiological Exams on Admission: Dg Chest 2 View  Result Date: 03/17/2018 CLINICAL DATA:  Irregular heartbeat and fatigue for the past 2 days. EXAM: CHEST - 2 VIEW COMPARISON:  Chest x-ray dated December 20, 2016. FINDINGS: The patient is rotated to the right, limiting evaluation. Stable cardiomediastinal  silhouette. Normal pulmonary vascularity. Slightly increased patchy opacity in the right upper lobe. New trace bilateral pleural effusions. No pneumothorax. No acute osseous abnormality. IMPRESSION: 1. Slightly increased patchy opacity in the right upper lobe may be related to rotation, but could reflect pneumonia. 2. New trace bilateral pleural effusions. Electronically Signed   By: Titus Dubin M.D.   On: 03/17/2018 17:24    EKG: Independently reviewed.  Atrial fibrillation (heart rate 109), PVCs.  Assessment/Plan Principal Problem:   Acute on chronic respiratory failure with hypoxia (HCC) Active Problems:   CAP (community acquired pneumonia)   COPD (chronic obstructive pulmonary disease) ([place])   New onset atrial fibrillation (HCC)   Volume overload   Chronic anemia   CKD (chronic kidney disease) stage 3, GFR 30-59 ml/min (HCC)   Acute on chronic hypoxic respiratory failure secondary to community-acquired pneumonia, new onset A. fib with RVR, and possible volume overload -Oxygen saturation 90% on room air and placed on 2 L supplemental oxygen by EMS.  Patient uses 2 L home oxygen at night. -Heart rate in the 120s on arrival to the ED.  Not hypotensive.  Patient found to be in A. fib.  She has now converted back to normal sinus rhythm and heart rate in the 80s.  Oral Cardizem 180 mg and Eliquis ordered in the ED, will continue. CHA2DS2VASc 6. Check TSH. Order echocardiogram. -Chest x-ray showing slightly increased patchy opacity in the right upper lobe and new trace bilateral pleural effusions.  White count 17.1.  Afebrile.  Start ceftriaxone and azithromycin.  Repeat CBC in a.m. -Chest x-ray without evidence of overt pulmonary edema, however, patient does have JVD and pedal edema on exam.  BNP 626, no prior baseline.  Received IV Lasix 40 mg once in the ED. Monitor intake and output, daily weights.  Reassess patient in the morning before giving any additional doses of Lasix.  Continue to  monitor renal function with IV diuresis. -Troponin negative.  EKG not suggestive of ACS. -Repeat CBC in a.m.  Chronic anemia -Hemoglobin 11.3, at baseline.  CKD 3 -Creatinine 1.0,  at baseline.  COPD -Stable.  No bronchospasm.  DuoNebs as needed.  Unable to safely order home medications at this time as pharmacy medication reconciliation has not been done yet.  DVT prophylaxis: Eliquis Code Status: Patient wishes to be DNR. Family Communication: Family at bedside. Disposition Plan: Anticipate discharge after clinical improvement. Consults called: None Admission status: Observation, telemetry  Shela Leff MD [facility] Pager [phone]  If 7PM-7AM, please contact night-coverage [email or web address] Password [email or web address]  03/17/2018, 8:00 PM

## 2018-03-18 ENCOUNTER — Other Ambulatory Visit (HOSPITAL_COMMUNITY): Payer: Medicare Other

## 2018-03-18 ENCOUNTER — Encounter (HOSPITAL_COMMUNITY): Payer: Self-pay

## 2018-03-18 ENCOUNTER — Inpatient Hospital Stay (HOSPITAL_COMMUNITY): Payer: Medicare Other

## 2018-03-18 DIAGNOSIS — J9621 Acute and chronic respiratory failure with hypoxia: Secondary | ICD-10-CM | POA: Diagnosis present

## 2018-03-18 DIAGNOSIS — Z853 Personal history of malignant neoplasm of breast: Secondary | ICD-10-CM | POA: Diagnosis not present

## 2018-03-18 DIAGNOSIS — M1990 Unspecified osteoarthritis, unspecified site: Secondary | ICD-10-CM | POA: Diagnosis present

## 2018-03-18 DIAGNOSIS — I4891 Unspecified atrial fibrillation: Secondary | ICD-10-CM

## 2018-03-18 DIAGNOSIS — I361 Nonrheumatic tricuspid (valve) insufficiency: Secondary | ICD-10-CM | POA: Diagnosis not present

## 2018-03-18 DIAGNOSIS — Z8249 Family history of ischemic heart disease and other diseases of the circulatory system: Secondary | ICD-10-CM | POA: Diagnosis not present

## 2018-03-18 DIAGNOSIS — Z87891 Personal history of nicotine dependence: Secondary | ICD-10-CM | POA: Diagnosis not present

## 2018-03-18 DIAGNOSIS — J181 Lobar pneumonia, unspecified organism: Secondary | ICD-10-CM

## 2018-03-18 DIAGNOSIS — M6281 Muscle weakness (generalized): Secondary | ICD-10-CM | POA: Diagnosis not present

## 2018-03-18 DIAGNOSIS — Z881 Allergy status to other antibiotic agents status: Secondary | ICD-10-CM | POA: Diagnosis not present

## 2018-03-18 DIAGNOSIS — G459 Transient cerebral ischemic attack, unspecified: Secondary | ICD-10-CM | POA: Diagnosis not present

## 2018-03-18 DIAGNOSIS — N183 Chronic kidney disease, stage 3 (moderate): Secondary | ICD-10-CM | POA: Diagnosis present

## 2018-03-18 DIAGNOSIS — Z880 Allergy status to penicillin: Secondary | ICD-10-CM | POA: Diagnosis not present

## 2018-03-18 DIAGNOSIS — Z7401 Bed confinement status: Secondary | ICD-10-CM | POA: Diagnosis not present

## 2018-03-18 DIAGNOSIS — I6529 Occlusion and stenosis of unspecified carotid artery: Secondary | ICD-10-CM | POA: Diagnosis not present

## 2018-03-18 DIAGNOSIS — E039 Hypothyroidism, unspecified: Secondary | ICD-10-CM | POA: Diagnosis present

## 2018-03-18 DIAGNOSIS — E785 Hyperlipidemia, unspecified: Secondary | ICD-10-CM | POA: Diagnosis present

## 2018-03-18 DIAGNOSIS — R000 Tachycardia, unspecified: Secondary | ICD-10-CM | POA: Diagnosis not present

## 2018-03-18 DIAGNOSIS — R0902 Hypoxemia: Secondary | ICD-10-CM | POA: Diagnosis not present

## 2018-03-18 DIAGNOSIS — M2550 Pain in unspecified joint: Secondary | ICD-10-CM | POA: Diagnosis not present

## 2018-03-18 DIAGNOSIS — Z825 Family history of asthma and other chronic lower respiratory diseases: Secondary | ICD-10-CM | POA: Diagnosis not present

## 2018-03-18 DIAGNOSIS — Z882 Allergy status to sulfonamides status: Secondary | ICD-10-CM | POA: Diagnosis not present

## 2018-03-18 DIAGNOSIS — Z9981 Dependence on supplemental oxygen: Secondary | ICD-10-CM | POA: Diagnosis not present

## 2018-03-18 DIAGNOSIS — J440 Chronic obstructive pulmonary disease with acute lower respiratory infection: Secondary | ICD-10-CM | POA: Diagnosis present

## 2018-03-18 DIAGNOSIS — J9612 Chronic respiratory failure with hypercapnia: Secondary | ICD-10-CM | POA: Diagnosis not present

## 2018-03-18 DIAGNOSIS — I959 Hypotension, unspecified: Secondary | ICD-10-CM | POA: Diagnosis not present

## 2018-03-18 DIAGNOSIS — I480 Paroxysmal atrial fibrillation: Secondary | ICD-10-CM | POA: Diagnosis present

## 2018-03-18 DIAGNOSIS — K219 Gastro-esophageal reflux disease without esophagitis: Secondary | ICD-10-CM | POA: Diagnosis present

## 2018-03-18 DIAGNOSIS — M810 Age-related osteoporosis without current pathological fracture: Secondary | ICD-10-CM | POA: Diagnosis present

## 2018-03-18 DIAGNOSIS — Z8542 Personal history of malignant neoplasm of other parts of uterus: Secondary | ICD-10-CM | POA: Diagnosis not present

## 2018-03-18 DIAGNOSIS — Z91048 Other nonmedicinal substance allergy status: Secondary | ICD-10-CM | POA: Diagnosis not present

## 2018-03-18 DIAGNOSIS — I130 Hypertensive heart and chronic kidney disease with heart failure and stage 1 through stage 4 chronic kidney disease, or unspecified chronic kidney disease: Secondary | ICD-10-CM | POA: Diagnosis present

## 2018-03-18 DIAGNOSIS — Z90710 Acquired absence of both cervix and uterus: Secondary | ICD-10-CM | POA: Diagnosis not present

## 2018-03-18 DIAGNOSIS — I5033 Acute on chronic diastolic (congestive) heart failure: Secondary | ICD-10-CM | POA: Diagnosis present

## 2018-03-18 DIAGNOSIS — D649 Anemia, unspecified: Secondary | ICD-10-CM | POA: Diagnosis not present

## 2018-03-18 DIAGNOSIS — J90 Pleural effusion, not elsewhere classified: Secondary | ICD-10-CM | POA: Diagnosis not present

## 2018-03-18 DIAGNOSIS — C50912 Malignant neoplasm of unspecified site of left female breast: Secondary | ICD-10-CM | POA: Diagnosis not present

## 2018-03-18 DIAGNOSIS — Z8673 Personal history of transient ischemic attack (TIA), and cerebral infarction without residual deficits: Secondary | ICD-10-CM | POA: Diagnosis not present

## 2018-03-18 DIAGNOSIS — J189 Pneumonia, unspecified organism: Secondary | ICD-10-CM | POA: Diagnosis present

## 2018-03-18 DIAGNOSIS — J9611 Chronic respiratory failure with hypoxia: Secondary | ICD-10-CM | POA: Diagnosis not present

## 2018-03-18 DIAGNOSIS — R2681 Unsteadiness on feet: Secondary | ICD-10-CM | POA: Diagnosis not present

## 2018-03-18 LAB — BASIC METABOLIC PANEL
Anion gap: 12 (ref 5–15)
BUN: 18 mg/dL (ref 8–23)
CO2: 25 mmol/L (ref 22–32)
Calcium: 8.4 mg/dL — ABNORMAL LOW (ref 8.9–10.3)
Chloride: 99 mmol/L (ref 98–111)
Creatinine, Ser: 1.08 mg/dL — ABNORMAL HIGH (ref 0.44–1.00)
GFR calc Af Amer: 49 mL/min — ABNORMAL LOW (ref >60)
GFR calc non Af Amer: 43 mL/min — ABNORMAL LOW (ref >60)
Glucose, Bld: 99 mg/dL (ref 70–99)
Potassium: 3 mmol/L — ABNORMAL LOW (ref 3.5–5.1)
Sodium: 136 mmol/L (ref 135–145)

## 2018-03-18 LAB — CBC
HCT: 28.9 % — ABNORMAL LOW (ref 36.0–46.0)
Hemoglobin: 9.9 g/dL — ABNORMAL LOW (ref 12.0–15.0)
MCH: 32 pg (ref 26.0–34.0)
MCHC: 34.3 g/dL (ref 30.0–36.0)
MCV: 93.5 fL (ref 80.0–100.0)
Platelets: 223 10*3/uL (ref 150–400)
RBC: 3.09 MIL/uL — ABNORMAL LOW (ref 3.87–5.11)
RDW: 13.2 % (ref 11.5–15.5)
WBC: 13.2 10*3/uL — ABNORMAL HIGH (ref 4.0–10.5)
nRBC: 0 % (ref 0.0–0.2)

## 2018-03-18 LAB — ECHOCARDIOGRAM COMPLETE
Height: 58 in
Weight: 1595.2 oz

## 2018-03-18 LAB — TSH: TSH: 0.983 u[IU]/mL (ref 0.350–4.500)

## 2018-03-18 MED ORDER — DILTIAZEM HCL ER COATED BEADS 120 MG PO CP24
120.0000 mg | ORAL_CAPSULE | Freq: Every day | ORAL | Status: DC
  Administered 2018-03-19 – 2018-03-21 (×3): 120 mg via ORAL
  Filled 2018-03-18 (×3): qty 1

## 2018-03-18 MED ORDER — MOMETASONE FURO-FORMOTEROL FUM 200-5 MCG/ACT IN AERO
2.0000 | INHALATION_SPRAY | Freq: Two times a day (BID) | RESPIRATORY_TRACT | Status: DC
  Administered 2018-03-18 – 2018-03-21 (×7): 2 via RESPIRATORY_TRACT
  Filled 2018-03-18: qty 8.8

## 2018-03-18 MED ORDER — LEVOTHYROXINE SODIUM 88 MCG PO TABS
88.0000 ug | ORAL_TABLET | Freq: Every day | ORAL | Status: DC
  Administered 2018-03-18 – 2018-03-21 (×4): 88 ug via ORAL
  Filled 2018-03-18 (×4): qty 1

## 2018-03-18 MED ORDER — MAGNESIUM SULFATE IN D5W 1-5 GM/100ML-% IV SOLN
1.0000 g | Freq: Once | INTRAVENOUS | Status: AC
  Administered 2018-03-18: 1 g via INTRAVENOUS
  Filled 2018-03-18: qty 100

## 2018-03-18 MED ORDER — POTASSIUM CHLORIDE CRYS ER 10 MEQ PO TBCR
40.0000 meq | EXTENDED_RELEASE_TABLET | Freq: Once | ORAL | Status: AC
  Administered 2018-03-18: 40 meq via ORAL
  Filled 2018-03-18: qty 4

## 2018-03-18 MED ORDER — ASPIRIN EC 81 MG PO TBEC
81.0000 mg | DELAYED_RELEASE_TABLET | Freq: Every day | ORAL | Status: DC
  Administered 2018-03-18: 81 mg via ORAL
  Filled 2018-03-18: qty 1

## 2018-03-18 MED ORDER — FLUTICASONE PROPIONATE 50 MCG/ACT NA SUSP
2.0000 | Freq: Every day | NASAL | Status: DC
  Administered 2018-03-18: 2 via NASAL
  Filled 2018-03-18: qty 16

## 2018-03-18 MED ORDER — FUROSEMIDE 10 MG/ML IJ SOLN
20.0000 mg | Freq: Every day | INTRAMUSCULAR | Status: DC
  Administered 2018-03-19: 20 mg via INTRAVENOUS
  Filled 2018-03-18: qty 2

## 2018-03-18 MED ORDER — UMECLIDINIUM BROMIDE 62.5 MCG/INH IN AEPB
1.0000 | INHALATION_SPRAY | Freq: Every day | RESPIRATORY_TRACT | Status: DC
  Administered 2018-03-18 – 2018-03-21 (×3): 1 via RESPIRATORY_TRACT
  Filled 2018-03-18: qty 7

## 2018-03-18 MED ORDER — PANTOPRAZOLE SODIUM 40 MG PO TBEC
40.0000 mg | DELAYED_RELEASE_TABLET | Freq: Every day | ORAL | Status: DC
  Administered 2018-03-18 – 2018-03-21 (×4): 40 mg via ORAL
  Filled 2018-03-18 (×4): qty 1

## 2018-03-18 MED ORDER — GUAIFENESIN ER 600 MG PO TB12
1200.0000 mg | ORAL_TABLET | Freq: Two times a day (BID) | ORAL | Status: DC
  Administered 2018-03-18 – 2018-03-21 (×7): 1200 mg via ORAL
  Filled 2018-03-18 (×7): qty 2

## 2018-03-18 NOTE — Progress Notes (Signed)
  Echocardiogram 2D Echocardiogram has been performed.  Katherine [name] 03/18/2018, 3:48 PM

## 2018-03-18 NOTE — Discharge Instructions (Signed)

## 2018-03-18 NOTE — Progress Notes (Signed)
PROGRESS NOTE                                                                                                                                                                                                             Patient Demographics:    Katherine [name], is a 85 y.o. female, DOB - Oct 01, 1917, VXY:[id number]  Admit date - 03/17/2018   Admitting Physician Shela Leff, MD  Outpatient Primary MD for the patient is Lavone Orn, MD  LOS - 0   Chief Complaint  Patient presents with  . Tachycardia       Brief Narrative    85 y.o. female with medical history significant of CVA, hypothyroidism, hypertension, hyperlipidemia, GERD, COPD presenting to the hospital for evaluation of shortness of breath.  He had worsening lower extremity edema, her work-up was significant for up pneumonia, volume overload or new onset A. fib with RVR.  Was admitted for further work-up.   Subjective:    Katherine [name] today reports some dyspnea, not back at baseline yet, reports cough, less productive today, for generalized weakness.   Assessment  & Plan :    Principal Problem:   Acute on chronic respiratory failure with hypoxia (HCC) Active Problems:   CAP (community acquired pneumonia)   COPD (chronic obstructive pulmonary disease) ([place])   New onset atrial fibrillation (HCC)   Volume overload   Chronic anemia   CKD (chronic kidney disease) stage 3, GFR 30-59 ml/min (HCC)  Community-acquired pneumonia -X-ray significant for worsening right upper opacity, she had leukocytosis, with cough, productive sputum, continue with IV Rocephin and azithromycin, will start on incentive spirometry and flutter valve, will start on Mucinex. -Patient on 2 L nasal cannula at nighttime only, this morning she is requiring 2 L nasal cannula daytime, she was encouraged use incentive spirometry . -Volume overload with bilateral extremity edema, and some pleural effusion, will continue  with Lasix, low-dose given soft blood pressure, follow on 2D echo  Paroxysmal  A. fib -New onset, currently converted back to normal sinus rhythm, started on Cardizem CD 180 mg oral daily, soft blood pressure and she is back to normal sinus rhythm, will decrease to Cardizem CD 120 mg oral daily. -follow On 2D echo - CHA2DS2VASc 6, heart is on Eliquis, denies any history of falls  Hypokalemia -Likely elated to Lasix she received yesterday,  will replete and recheck in a.m.Marland Kitchen  Chronic anemia -Hemoglobin 11.3, at baseline.  CKD 3 -Creatinine 1.0, at baseline.  COPD -Stable.  No bronchospasm.  DuoNebs as needed.  Hx of nonhemorrhagic CVA -Now she is on Eliquis  Code Status : DNR  Family Communication  : None at bedside  Disposition Plan  : Lives at [facility], dependent living, will consult PT.  Barriers For Discharge : Patient still symptomatic with hypoxia, not back to her baseline from dyspnea, and oxygen requirement, still needs further work-up including echo, diuresis, and medication adjustment, still on IV antibiotics  Consults  :  none  Procedures  : none  DVT Prophylaxis  :  On Eliquis  Lab Results  Component Value Date   PLT 223 03/18/2018    Antibiotics  :    Anti-infectives (From admission, onward)   Start     Dose/Rate Route Frequency Ordered Stop   03/17/18 2030  cefTRIAXone (ROCEPHIN) 1 g in sodium chloride 0.9 % 100 mL IVPB     1 g 200 mL/hr over 30 Minutes Intravenous Every 24 hours 03/17/18 1914     03/17/18 2030  azithromycin (ZITHROMAX) 500 mg in sodium chloride 0.9 % 250 mL IVPB     500 mg 250 mL/hr over 60 Minutes Intravenous Every 24 hours 03/17/18 1914          Objective:   Vitals:   03/18/18 0800 03/18/18 0900 03/18/18 1118 03/18/18 1119  BP: (!) 130/53 (!) 118/49    Pulse: 68 73    Resp: 19 (!) 24    Temp:      TempSrc:      SpO2: 91% 90% 94% 94%  Weight:      Height:        Wt Readings from Last 3 Encounters:  03/18/18  45.2 kg  09/13/17 46.5 kg  05/05/17 46.9 kg     Intake/Output Summary (Last 24 hours) at 03/18/2018 1152 Last data filed at 03/18/2018 0222 Gross per 24 hour  Intake 357.99 ml  Output -  Net 357.99 ml     Physical Exam  Awake Alert, Oriented X 3, No new F.N deficits, Normal affect Symmetrical Chest wall movement, diminished air entry at the bases,with some crackles , no wheezing, no use of accessory muscles RRR,No Gallops,Rubs or new Murmurs, No Parasternal Heave +ve B.Sounds, Abd Soft, No tenderness, No rebound - guarding or rigidity. No Cyanosis, Clubbing , trace pedal edema, No new Rash or bruise      Data Review:    CBC Recent Labs  Lab 03/17/18 1604 03/18/18 0505  WBC 17.1* 13.2*  HGB 11.3* 9.9*  HCT 34.5* 28.9*  PLT 253 223  MCV 93.8 93.5  MCH 30.7 32.0  MCHC 32.8 34.3  RDW 13.0 13.2  LYMPHSABS 2.0  --   MONOABS 2.1*  --   EOSABS 0.1  --   BASOSABS 0.1  --     Chemistries  Recent Labs  Lab 03/17/18 1604 03/17/18 1753 03/18/18 0505  NA 134*  --  136  K 3.7  --  3.0*  CL 99  --  99  CO2 23  --  25  GLUCOSE 98  --  99  BUN 19  --  18  CREATININE 1.04*  --  1.08*  CALCIUM 9.0  --  8.4*  MG  --  1.7  --   AST 21  --   --   ALT 11  --   --  ALKPHOS 56  --   --   BILITOT 1.0  --   --    ------------------------------------------------------------------------------------------------------------------ No results for input(s): CHOL, HDL, LDLCALC, TRIG, CHOLHDL, LDLDIRECT in the last 72 hours.  No results found for: HGBA1C ------------------------------------------------------------------------------------------------------------------ Recent Labs    03/18/18 0505  TSH 0.983   ------------------------------------------------------------------------------------------------------------------ No results for input(s): VITAMINB12, FOLATE, FERRITIN, TIBC, IRON, RETICCTPCT in the last 72 hours.  Coagulation profile No results for input(s): INR, PROTIME  in the last 168 hours.  No results for input(s): DDIMER in the last 72 hours.  Cardiac Enzymes Recent Labs  Lab 03/17/18 1604  TROPONINI <0.03   ------------------------------------------------------------------------------------------------------------------    Component Value Date/Time   BNP 626.2 (H) 03/17/2018 1603    Inpatient Medications  Scheduled Meds: . apixaban  2.5 mg Oral BID  . [START ON 03/19/2018] diltiazem  120 mg Oral Daily  . fluticasone  2 spray Each Nare Daily  . [START ON 03/19/2018] furosemide  20 mg Intravenous Daily  . levothyroxine  88 mcg Oral QAC breakfast  . mometasone-formoterol  2 puff Inhalation BID  . pantoprazole  40 mg Oral Daily  . potassium chloride  40 mEq Oral Once  . umeclidinium bromide  1 puff Inhalation Daily   Continuous Infusions: . azithromycin Stopped (03/17/18 2357)  . cefTRIAXone (ROCEPHIN)  IV Stopped (03/17/18 2146)   PRN Meds:.ipratropium-albuterol  Micro Results No results found for this or any previous visit (from the past 240 hour(s)).  Radiology Reports Dg Chest 2 View  Result Date: 03/17/2018 CLINICAL DATA:  Irregular heartbeat and fatigue for the past 2 days. EXAM: CHEST - 2 VIEW COMPARISON:  Chest x-ray dated December 20, 2016. FINDINGS: The patient is rotated to the right, limiting evaluation. Stable cardiomediastinal silhouette. Normal pulmonary vascularity. Slightly increased patchy opacity in the right upper lobe. New trace bilateral pleural effusions. No pneumothorax. No acute osseous abnormality. IMPRESSION: 1. Slightly increased patchy opacity in the right upper lobe may be related to rotation, but could reflect pneumonia. 2. New trace bilateral pleural effusions. Electronically Signed   By: Titus Dubin M.D.   On: 03/17/2018 17:24     Phillips Climes M.D on 03/18/2018 at 11:52 AM  Between 7am to 7pm - Pager - [phone]  After 7pm go to [email or web address] - password [facility]  [facility] -  Office   [phone]

## 2018-03-19 LAB — BASIC METABOLIC PANEL
Anion gap: 10 (ref 5–15)
BUN: 19 mg/dL (ref 8–23)
CO2: 24 mmol/L (ref 22–32)
CREATININE: 0.86 mg/dL (ref 0.44–1.00)
Calcium: 8.1 mg/dL — ABNORMAL LOW (ref 8.9–10.3)
Chloride: 100 mmol/L (ref 98–111)
GFR calc Af Amer: >60 mL/min (ref >60)
GFR calc non Af Amer: 56 mL/min — ABNORMAL LOW (ref >60)
Glucose, Bld: 117 mg/dL — ABNORMAL HIGH (ref 70–99)
Potassium: 4.1 mmol/L (ref 3.5–5.1)
Sodium: 134 mmol/L — ABNORMAL LOW (ref 135–145)

## 2018-03-19 LAB — CBC
HCT: 27.7 % — ABNORMAL LOW (ref 36.0–46.0)
Hemoglobin: 9 g/dL — ABNORMAL LOW (ref 12.0–15.0)
MCH: 30.4 pg (ref 26.0–34.0)
MCHC: 32.5 g/dL (ref 30.0–36.0)
MCV: 93.6 fL (ref 80.0–100.0)
Platelets: 205 10*3/uL (ref 150–400)
RBC: 2.96 MIL/uL — ABNORMAL LOW (ref 3.87–5.11)
RDW: 13.1 % (ref 11.5–15.5)
WBC: 12 10*3/uL — ABNORMAL HIGH (ref 4.0–10.5)
nRBC: 0 % (ref 0.0–0.2)

## 2018-03-19 MED ORDER — FUROSEMIDE 20 MG PO TABS
20.0000 mg | ORAL_TABLET | Freq: Every day | ORAL | Status: DC
  Administered 2018-03-20 – 2018-03-21 (×2): 20 mg via ORAL
  Filled 2018-03-19 (×2): qty 1

## 2018-03-19 NOTE — Progress Notes (Signed)
PROGRESS NOTE                                                                                                                                                                                                             Patient Demographics:    Katherine [name], is a 85 y.o. female, DOB - December 26, 1917, MVE:[id number]  Admit date - 03/17/2018   Admitting Physician Shela Leff, MD  Outpatient Primary MD for the patient is Lavone Orn, MD  LOS - 1   Chief Complaint  Patient presents with  . Tachycardia       Brief Narrative    85 y.o. female with medical history significant of CVA, hypothyroidism, hypertension, hyperlipidemia, GERD, COPD presenting to the hospital for evaluation of shortness of breath.  He had worsening lower extremity edema, her work-up was significant for up pneumonia, volume overload or new onset A. fib with RVR.  Was admitted for further work-up.   Subjective:    Katherine [name] today reports she still feeling weak, remains with some dyspnea, report her cough is more productive today, still reports generalized weakness.   Assessment  & Plan :    Principal Problem:   Acute on chronic respiratory failure with hypoxia (HCC) Active Problems:   CAP (community acquired pneumonia)   COPD (chronic obstructive pulmonary disease) (HCC)   New onset atrial fibrillation (HCC)   Volume overload   Chronic anemia   CKD (chronic kidney disease) stage 3, GFR 30-59 ml/min (HCC)  Community-acquired pneumonia -X-ray significant for worsening right upper opacity, she had leukocytosis, with cough, productive sputum, continue with IV Rocephin and azithromycin, encouraged use incentive spirometry and flutter valve today, continue with Mucinex. -He remains on 2 L nasal cannula today daytime today, her baseline only at nighttime.  Acute on chronic diastolic CHF -2D echo showing preserved EF, grade 2 diastolic dysfunction, has volume overload on  imaging, as well lower extremity edema, possibly uncontrolled heart rate on presentation contributing to it, improving with IV Lasix, I will change to oral Lasix from tomorrow.  Paroxysmal  A. fib -New onset, currently converted back to normal sinus rhythm, started on Cardizem CD 180 mg oral daily, soft blood pressure and she is back to normal sinus rhythm, continue with Cardizem CD 120 mg oral daily . - CHA2DS2VASc 6, heart is on Eliquis, denies any history  of falls  Hypokalemia -Lrepleted   Anemia, unspecified -Hemoglobin 11.3, at baseline.  CKD 3 -Creatinine 1.0, at baseline.  COPD -Stable.  No bronchospasm.  DuoNebs as needed.  Hx of nonhemorrhagic CVA -Now she is on Eliquis  Code Status : DNR  Family Communication  : None at bedside  Disposition Plan  : Lives at friend[facility] home, dependent living, will consult PT.  Barriers For Discharge : Still having some dyspnea awaiting PT evaluation, still requiring oxygen daytime which is not her baseline    Consults  :  none  Procedures  : none  DVT Prophylaxis  :  On Eliquis  Lab Results  Component Value Date   PLT 205 03/19/2018    Antibiotics  :    Anti-infectives (From admission, onward)   Start     Dose/Rate Route Frequency Ordered Stop   03/17/18 2030  cefTRIAXone (ROCEPHIN) 1 g in sodium chloride 0.9 % 100 mL IVPB     1 g 200 mL/hr over 30 Minutes Intravenous Every 24 hours 03/17/18 1914     03/17/18 2030  azithromycin (ZITHROMAX) 500 mg in sodium chloride 0.9 % 250 mL IVPB     500 mg 250 mL/hr over 60 Minutes Intravenous Every 24 hours 03/17/18 1914          Objective:   Vitals:   03/19/18 0441 03/19/18 0601 03/19/18 0804 03/19/18 0844  BP: (!) 131/59   137/62  Pulse: 83  83   Resp: (!) 23  20   Temp: 99.4 F (37.4 C)     TempSrc: Oral     SpO2: 93%  93%   Weight:  45.5 kg    Height:        Wt Readings from Last 3 Encounters:  03/19/18 45.5 kg  09/13/17 46.5 kg  05/05/17 46.9 kg      Intake/Output Summary (Last 24 hours) at 03/19/2018 1221 Last data filed at 03/19/2018 1100 Gross per 24 hour  Intake 684.91 ml  Output 900 ml  Net -215.09 ml     Physical Exam  Awake Alert, Oriented X 3, No new F.N deficits, Normal affect Symmetrical Chest wall movement, proved air entry at the bases today, no crackles, no wheezing RRR,No Gallops,Rubs or new Murmurs, No Parasternal Heave +ve B.Sounds, Abd Soft, No tenderness, No rebound - guarding or rigidity. No Cyanosis, Clubbing, edema has resolved, No new Rash or bruise         Data Review:    CBC Recent Labs  Lab 03/17/18 1604 03/18/18 0505 03/19/18 0436  WBC 17.1* 13.2* 12.0*  HGB 11.3* 9.9* 9.0*  HCT 34.5* 28.9* 27.7*  PLT 253 223 205  MCV 93.8 93.5 93.6  MCH 30.7 32.0 30.4  MCHC 32.8 34.3 32.5  RDW 13.0 13.2 13.1  LYMPHSABS 2.0  --   --   MONOABS 2.1*  --   --   EOSABS 0.1  --   --   BASOSABS 0.1  --   --     Chemistries  Recent Labs  Lab 03/17/18 1604 03/17/18 1753 03/18/18 0505 03/19/18 0436  NA 134*  --  136 134*  K 3.7  --  3.0* 4.1  CL 99  --  99 100  CO2 23  --  25 24  GLUCOSE 98  --  99 117*  BUN 19  --  18 19  CREATININE 1.04*  --  1.08* 0.86  CALCIUM 9.0  --  8.4* 8.1*  MG  --  1.7  --   --  AST 21  --   --   --   ALT 11  --   --   --   ALKPHOS 56  --   --   --   BILITOT 1.0  --   --   --    ------------------------------------------------------------------------------------------------------------------ No results for input(s): CHOL, HDL, LDLCALC, TRIG, CHOLHDL, LDLDIRECT in the last 72 hours.  No results found for: HGBA1C ------------------------------------------------------------------------------------------------------------------ Recent Labs    03/18/18 0505  TSH 0.983   ------------------------------------------------------------------------------------------------------------------ No results for input(s): VITAMINB12, FOLATE, FERRITIN, TIBC, IRON, RETICCTPCT  in the last 72 hours.  Coagulation profile No results for input(s): INR, PROTIME in the last 168 hours.  No results for input(s): DDIMER in the last 72 hours.  Cardiac Enzymes Recent Labs  Lab 03/17/18 1604  TROPONINI <0.03   ------------------------------------------------------------------------------------------------------------------    Component Value Date/Time   BNP 626.2 (H) 03/17/2018 1603    Inpatient Medications  Scheduled Meds: . apixaban  2.5 mg Oral BID  . diltiazem  120 mg Oral Daily  . fluticasone  2 spray Each Nare Daily  . furosemide  20 mg Intravenous Daily  . guaiFENesin  1,200 mg Oral BID  . levothyroxine  88 mcg Oral QAC breakfast  . mometasone-formoterol  2 puff Inhalation BID  . pantoprazole  40 mg Oral Daily  . umeclidinium bromide  1 puff Inhalation Daily   Continuous Infusions: . azithromycin Stopped (03/18/18 2337)  . cefTRIAXone (ROCEPHIN)  IV Stopped (03/18/18 2158)   PRN Meds:.ipratropium-albuterol  Micro Results No results found for this or any previous visit (from the past 240 hour(s)).  Radiology Reports Dg Chest 2 View  Result Date: 03/17/2018 CLINICAL DATA:  Irregular heartbeat and fatigue for the past 2 days. EXAM: CHEST - 2 VIEW COMPARISON:  Chest x-ray dated December 20, 2016. FINDINGS: The patient is rotated to the right, limiting evaluation. Stable cardiomediastinal silhouette. Normal pulmonary vascularity. Slightly increased patchy opacity in the right upper lobe. New trace bilateral pleural effusions. No pneumothorax. No acute osseous abnormality. IMPRESSION: 1. Slightly increased patchy opacity in the right upper lobe may be related to rotation, but could reflect pneumonia. 2. New trace bilateral pleural effusions. Electronically Signed   By: Titus Dubin M.D.   On: 03/17/2018 17:24     Phillips Climes M.D on 03/19/2018 at 12:21 PM  Between 7am to 7pm - Pager - [phone]  After 7pm go to [email or web address] - password  [facility]  [facility] -  Office  [phone]

## 2018-03-19 NOTE — Evaluation (Signed)
Physical Therapy Evaluation Patient Details Name: Katherine [name] MRN: [id number] DOB: 04-Jun-1917 Today's Date: 03/19/2018   History of Present Illness  Pt is a 85 y.o. F with significant PMH of CVA, hypertension, COPD who presents for evaluation of shortness of breath. Work-up was significant for pneumonia and volume overload in setting of new onset A. fib with RVR.  Clinical Impression  Pt admitted with above diagnosis. Prior to admission, pt lives in independent living facility ([facility]), ambulates with Rollator, and is independent with ADL's. On PT evaluation, pt is requiring mod assist to stand and min assist for stand pivot transfers. Further ambulation deferred due to urinary frequency. SpO2 96-100% on 2L O2, HR 80-low 100's. Pt states, "I can't go back to independent living right now." Due to pt requiring increased assistance for mobility in setting of weakness and decreased endurance, recommending short term rehab at discharge. Will follow acutely.    Follow Up Recommendations SNF;Supervision/Assistance - 24 hour (may progress to ALF level?)    Equipment Recommendations  None recommended by PT    Recommendations for Other Services OT consult     Precautions / Restrictions Precautions Precautions: Fall Precaution Comments: urinary frequency Restrictions Weight Bearing Restrictions: No      Mobility  Bed Mobility Overal bed mobility: Needs Assistance Bed Mobility: Supine to Sit     Supine to sit: Supervision     General bed mobility comments: increased time  Transfers Overall transfer level: Needs assistance Equipment used: Rolling walker (2 wheeled) Transfers: Sit to/from [facility] Sit to Stand: Mod assist Stand pivot transfers: Min assist       General transfer comment: mod assist provided to stand with increased time required to achieve upright. minA to perform stand pivot from bed to recliner  Ambulation/Gait                 Stairs            Wheelchair Mobility    Modified Rankin (Stroke Patients Only)       Balance Overall balance assessment: Needs assistance Sitting-balance support: Feet supported Sitting balance-Leahy Scale: Good     Standing balance support: Bilateral upper extremity supported Standing balance-Leahy Scale: Poor Standing balance comment: reliant on external support                             Pertinent Vitals/Pain Pain Assessment: No/denies pain    Home Living Family/patient expects to be discharged to:: Private residence Living Arrangements: Alone Available Help at Discharge: Personal care attendant;Available PRN/intermittently Type of Home: Independent living facility Home Access: Level entry     Home Layout: One level Home Equipment: Walker - 4 wheels;Cane - single point;Shower seat      Prior Function Level of Independence: Independent with assistive device(s)         Comments: Uses Rollator to ambulate to dining hall, independent ADL's, denies falls, enjoys reading     Hand Dominance        Extremity/Trunk Assessment   Upper Extremity Assessment Upper Extremity Assessment: Generalized weakness    Lower Extremity Assessment Lower Extremity Assessment: Generalized weakness    Cervical / Trunk Assessment Cervical / Trunk Assessment: Kyphotic  Communication   Communication: No difficulties  Cognition Arousal/Alertness: Awake/alert Behavior During Therapy: WFL for tasks assessed/performed Overall Cognitive Status: Within Functional Limits for tasks assessed  General Comments      Exercises     Assessment/Plan    PT Assessment Patient needs continued PT services  PT Problem List Decreased strength;Decreased activity tolerance;Decreased balance;Decreased mobility;Cardiopulmonary status limiting activity       PT Treatment Interventions DME instruction;Gait  training;Functional mobility training;Therapeutic activities;Therapeutic exercise;Balance training;Patient/family education    PT Goals (Current goals can be found in the Care Plan section)  Acute Rehab PT Goals Patient Stated Goal: "I can't go back to being independent right now." PT Goal Formulation: With patient Time For Goal Achievement: 04/02/18 Potential to Achieve Goals: Good    Frequency Min 3X/week   Barriers to discharge        Co-evaluation               AM-PAC PT "6 Clicks" Mobility  Outcome Measure Help needed turning from your back to your side while in a flat bed without using bedrails?: None Help needed moving from lying on your back to sitting on the side of a flat bed without using bedrails?: A Little Help needed moving to and from a bed to a chair (including a wheelchair)?: A Little Help needed standing up from a chair using your arms (e.g., wheelchair or bedside chair)?: A Little Help needed to walk in hospital room?: A Little Help needed climbing 3-5 steps with a railing? : Total 6 Click Score: 17    End of Session Equipment Utilized During Treatment: Gait belt;Oxygen Activity Tolerance: Patient tolerated treatment well Patient left: in chair;with call bell/phone within reach;with chair alarm set Nurse Communication: Mobility status PT Visit Diagnosis: Unsteadiness on feet (R26.81);Muscle weakness (generalized) (M62.81);Difficulty in walking, not elsewhere classified (R26.2)    Time: 2841-3244 PT Time Calculation (min) (ACUTE ONLY): 28 min   Charges:   PT Evaluation $PT Eval Moderate Complexity: 1 Mod PT Treatments $Gait Training: 8-22 mins        Ellamae Sia, PT, DPT Acute Rehabilitation Services Pager [phone] Office [phone]  Willy Eddy 03/19/2018, 1:01 PM

## 2018-03-20 MED ORDER — AZITHROMYCIN 250 MG PO TABS
500.0000 mg | ORAL_TABLET | Freq: Every day | ORAL | Status: DC
  Administered 2018-03-20: 500 mg via ORAL
  Filled 2018-03-20: qty 2

## 2018-03-20 NOTE — Progress Notes (Signed)
PROGRESS NOTE                                                                                                                                                                                                             Patient Demographics:    Katherine [name], is a 85 y.o. female, DOB - March 19, 1917, QMV:[id number]  Admit date - 03/17/2018   Admitting Physician Shela Leff, MD  Outpatient Primary MD for the patient is Lavone Orn, MD  LOS - 2   Chief Complaint  Patient presents with  . Tachycardia       Brief Narrative    85 y.o. female with medical history significant of CVA, hypothyroidism, hypertension, hyperlipidemia, GERD, COPD presenting to the hospital for evaluation of shortness of breath.  He had worsening lower extremity edema, her work-up was significant for up pneumonia, volume overload or new onset A. fib with RVR.  Was admitted for further work-up.   Subjective:    Katherine [name] today ports she is feeling better today, dyspnea has improved, cough as well has improved, still reports some weakness .    Assessment  & Plan :    Principal Problem:   Acute on chronic respiratory failure with hypoxia (HCC) Active Problems:   CAP (community acquired pneumonia)   COPD (chronic obstructive pulmonary disease) (HCC)   New onset atrial fibrillation (HCC)   Volume overload   Chronic anemia   CKD (chronic kidney disease) stage 3, GFR 30-59 ml/min (HCC)  Community-acquired pneumonia -X-ray significant for worsening right upper opacity, she had leukocytosis, with cough, productive sputum, continue with IV Rocephin and azithromycin, encouraged use incentive spirometry and flutter valve today, continue with Mucinex. -He remains on 2 L nasal cannula today daytime today, her baseline only at nighttime.  Acute on chronic diastolic CHF -2D echo showing preserved EF, grade 2 diastolic dysfunction, has volume overload on imaging, as well lower  extremity edema, possibly uncontrolled heart rate on presentation contributing to it, initial IV Lasix, currently transitioned to oral Lasix   Paroxysmal  A. fib -New onset, currently converted back to normal sinus rhythm, started on Cardizem CD 180 mg oral daily, soft blood pressure and she is back to normal sinus rhythm, continue with Cardizem CD 120 mg oral daily . - CHA2DS2VASc 6, heart is on Eliquis, denies any history of falls  Hypokalemia -repleted   Anemia, unspecified -Hemoglobin 11.3, at baseline.  CKD 3 -Creatinine 1.0, at baseline.  COPD -Stable.  No bronchospasm.  DuoNebs as needed.  Hx of non hemorrhagic CVA -Now she is on Eliquis  Code Status : DNR  Family Communication  : None at bedside  Disposition Plan  : Lives at friend's home, independent living, has been seen by PT, will need SNF level  Barriers For Discharge : Still having some dyspnea awaiting PT evaluation, still requiring oxygen daytime which is not her baseline    Consults  :  none  Procedures  : none  DVT Prophylaxis  :  On Eliquis  Lab Results  Component Value Date   PLT 205 03/19/2018    Antibiotics  :    Anti-infectives (From admission, onward)   Start     Dose/Rate Route Frequency Ordered Stop   03/20/18 2200  azithromycin (ZITHROMAX) tablet 500 mg     500 mg Oral Daily at bedtime 03/20/18 0823 03/21/18 2359   03/17/18 2030  cefTRIAXone (ROCEPHIN) 1 g in sodium chloride 0.9 % 100 mL IVPB     1 g 200 mL/hr over 30 Minutes Intravenous Every 24 hours 03/17/18 1914 03/21/18 2359   03/17/18 2030  azithromycin (ZITHROMAX) 500 mg in sodium chloride 0.9 % 250 mL IVPB  Status:  Discontinued     500 mg 250 mL/hr over 60 Minutes Intravenous Every 24 hours 03/17/18 1914 03/20/18 0823        Objective:   Vitals:   03/19/18 2149 03/20/18 0430 03/20/18 0801 03/20/18 0838  BP:  (!) 146/58  (!) 142/60  Pulse:  76  78  Resp:  19    Temp:  98.3 F (36.8 C)    TempSrc:  Oral    SpO2:  95% 94% 93%   Weight:  45.1 kg    Height:        Wt Readings from Last 3 Encounters:  03/20/18 45.1 kg  09/13/17 46.5 kg  05/05/17 46.9 kg     Intake/Output Summary (Last 24 hours) at 03/20/2018 1251 Last data filed at 03/20/2018 0457 Gross per 24 hour  Intake 498.84 ml  Output 950 ml  Net -451.16 ml     Physical Exam  Awake Alert, Oriented X 3, frail, no new F.N deficits, Normal affect Symmetrical Chest wall movement, Good air movement bilaterally, CTAB RRR,No Gallops,Rubs or new Murmurs, No Parasternal Heave +ve B.Sounds, Abd Soft, No tenderness, No rebound - guarding or rigidity. No Cyanosis, Clubbing or edema, No new Rash or bruise          Data Review:    CBC Recent Labs  Lab 03/17/18 1604 03/18/18 0505 03/19/18 0436  WBC 17.1* 13.2* 12.0*  HGB 11.3* 9.9* 9.0*  HCT 34.5* 28.9* 27.7*  PLT 253 223 205  MCV 93.8 93.5 93.6  MCH 30.7 32.0 30.4  MCHC 32.8 34.3 32.5  RDW 13.0 13.2 13.1  LYMPHSABS 2.0  --   --   MONOABS 2.1*  --   --   EOSABS 0.1  --   --   BASOSABS 0.1  --   --     Chemistries  Recent Labs  Lab 03/17/18 1604 03/17/18 1753 03/18/18 0505 03/19/18 0436  NA 134*  --  136 134*  K 3.7  --  3.0* 4.1  CL 99  --  99 100  CO2 23  --  25 24  GLUCOSE 98  --  99 117*  BUN 19  --  18 19  CREATININE 1.04*  --  1.08* 0.86  CALCIUM 9.0  --  8.4* 8.1*  MG  --  1.7  --   --   AST 21  --   --   --   ALT 11  --   --   --   ALKPHOS 56  --   --   --   BILITOT 1.0  --   --   --    ------------------------------------------------------------------------------------------------------------------ No results for input(s): CHOL, HDL, LDLCALC, TRIG, CHOLHDL, LDLDIRECT in the last 72 hours.  No results found for: HGBA1C ------------------------------------------------------------------------------------------------------------------ Recent Labs    03/18/18 0505  TSH 0.983    ------------------------------------------------------------------------------------------------------------------ No results for input(s): VITAMINB12, FOLATE, FERRITIN, TIBC, IRON, RETICCTPCT in the last 72 hours.  Coagulation profile No results for input(s): INR, PROTIME in the last 168 hours.  No results for input(s): DDIMER in the last 72 hours.  Cardiac Enzymes Recent Labs  Lab 03/17/18 1604  TROPONINI <0.03   ------------------------------------------------------------------------------------------------------------------    Component Value Date/Time   BNP 626.2 (H) 03/17/2018 1603    Inpatient Medications  Scheduled Meds: . apixaban  2.5 mg Oral BID  . azithromycin  500 mg Oral QHS  . diltiazem  120 mg Oral Daily  . fluticasone  2 spray Each Nare Daily  . furosemide  20 mg Oral Daily  . guaiFENesin  1,200 mg Oral BID  . levothyroxine  88 mcg Oral QAC breakfast  . mometasone-formoterol  2 puff Inhalation BID  . pantoprazole  40 mg Oral Daily  . umeclidinium bromide  1 puff Inhalation Daily   Continuous Infusions: . cefTRIAXone (ROCEPHIN)  IV 1 g (03/19/18 2015)   PRN Meds:.ipratropium-albuterol  Micro Results No results found for this or any previous visit (from the past 240 hour(s)).  Radiology Reports Dg Chest 2 View  Result Date: 03/17/2018 CLINICAL DATA:  Irregular heartbeat and fatigue for the past 2 days. EXAM: CHEST - 2 VIEW COMPARISON:  Chest x-ray dated December 20, 2016. FINDINGS: The patient is rotated to the right, limiting evaluation. Stable cardiomediastinal silhouette. Normal pulmonary vascularity. Slightly increased patchy opacity in the right upper lobe. New trace bilateral pleural effusions. No pneumothorax. No acute osseous abnormality. IMPRESSION: 1. Slightly increased patchy opacity in the right upper lobe may be related to rotation, but could reflect pneumonia. 2. New trace bilateral pleural effusions. Electronically Signed   By: Titus Dubin M.D.   On: 03/17/2018 17:24     Phillips Climes M.D on 03/20/2018 at 12:51 PM  Between 7am to 7pm - Pager - [phone]  After 7pm go to [email or web address] - password [facility]  [facility] -  Office  [phone]

## 2018-03-20 NOTE — Care Management (Signed)
[employer] benefits check sent and pending.  Marrell Dicaprio RN, BSN, NCM-BC, ACM-RN [phone] 

## 2018-03-20 NOTE — Clinical Social Work Note (Addendum)
Clinical Social Work Assessment  Patient Details  Name: Katherine [name] MRN: [id number] Date of Birth: Sep 28, 1917  Date of referral:  03/20/18               Reason for consult:  Facility Placement, Discharge Planning                Permission sought to share information with:  Facility Sport and exercise psychologist, Family Supports Permission granted to share information::  Yes, Verbal Permission Granted  Name::     Little Ishikawa  Agency::  [facility]  Relationship::  niece  Contact Information:  [phone]  Housing/Transportation Living arrangements for the past 2 months:  [place] of Information:  Patient Patient Interpreter Needed:  None Criminal Activity/Legal Involvement Pertinent to Current Situation/Hospitalization:  No - Comment as needed Significant Relationships:  Other Family Members Lives with:  Facility Resident, Self Do you feel safe going back to the place where you live?  Yes Need for family participation in patient care:  Yes (Comment)  Care giving concerns: Patient from [place]. PT recommending SNF.    Social Worker assessment / plan: CSW met with patient at bedside. Patient alert and oriented. CSW introduced self and role and discussed disposition planning.  Patient stated she did not feel well and did not provide a lot of information when prompted. Patient is agreeable to SNF at [facility]. Patient gave permission to speak to her niece, Katherine [name], about disposition planning.  Spoke to patient's niece on the phone. Katherine [name] is agreeable for patient to go to SNF too, though states that [facility] may not have a SNF bed available. Katherine [name] reported that patient has in-home care from [place] when she is at her ILF.  Sent initial referral to [facility] SNF. Will follow and support with discharge planning.  Update 3:40 pm: Spoke to admissions at [facility] and they should have a bed available for patient tomorrow.  Employment  status:  Retired Forensic scientist:  Medicare PT Recommendations:  [place] / Referral to community resources:  [place]  Patient/Family's Response to care: Patient and niece appreciative of care.  Patient/Family's Understanding of and Emotional Response to Diagnosis, Current Treatment, and Prognosis: Patient's niece with good understanding of patient's condition and care needs. Agreeable for patient to go to SNF.  Emotional Assessment Appearance:  Appears stated age Attitude/Demeanor/Rapport:  Engaged, Lethargic Affect (typically observed):  Accepting, Calm, Appropriate Orientation:  Oriented to Self, Oriented to Place, Oriented to  Time, Oriented to Situation Alcohol / Substance use:  Not Applicable Psych involvement (Current and /or in the community):  No (Comment)  Discharge Needs  Concerns to be addressed:  Discharge Planning Concerns, Care Coordination Readmission within the last 30 days:  No Current discharge risk:  Physical Impairment Barriers to Discharge:  Continued Medical Work up   Estanislado Emms, LCSW 03/20/2018, 12:27 PM

## 2018-03-20 NOTE — NC FL2 (Addendum)
[place] MEDICAID FL2 LEVEL OF CARE SCREENING TOOL     IDENTIFICATION  Patient Name: Katherine [name] Birthdate: [birth year] Sex: female Admission Date (Current Location): 03/17/2018  [facility] and [place] Number:  Herbalist and Address:  [facility], [place] [address], [place], [place] [address]      Provider Number: [id number]  Attending Physician Name and Address:  Elgergawy, Silver Huguenin, MD  Relative Name and Phone Number:  Little Ishikawa, niece, [phone]    Current Level of Care: Hospital Recommended Level of Care: [place] Prior Approval Number:    Date Approved/Denied:   PASRR Number: [id number]  Discharge Plan: SNF    Current Diagnoses: Patient Active Problem List   Diagnosis Date Noted  . Acute on chronic respiratory failure with hypoxia ([place]) 03/17/2018  . New onset atrial fibrillation ([place]) 03/17/2018  . Volume overload 03/17/2018  . Chronic anemia 03/17/2018  . CKD (chronic kidney disease) stage 3, GFR 30-59 ml/min (HCC) 03/17/2018  . Breast cancer ([place]) 03/16/2013  . Paget's disease and intraductal carcinoma of left breast ([place]) 03/16/2013  . Aftercare following surgery of the circulatory system, [place] 01/03/2013  . COPD (chronic obstructive pulmonary disease) with acute bronchitis ([place]) 03/16/2012  . Altered mental status 03/16/2012  . Occlusion and stenosis of carotid artery without mention of cerebral infarction 01/06/2012  . Bronchiectasis without acute exacerbation ([place]) 08/20/2011  . Atypical mycobacterial disease 08/20/2011  . Hypothyroidism 07/24/2011  . Hyperlipidemia 07/24/2011  . Hypertension 07/24/2011  . CAP (community acquired pneumonia) 07/23/2011  . COPD (chronic obstructive pulmonary disease) ([place]) 07/23/2011  . Hemoptysis 07/23/2011    Orientation RESPIRATION BLADDER Height & Weight     Self, Time, Situation, Place  O2(nasal cannula 2L) Incontinent, External catheter Weight: 45.1 kg Height:   4\' 10"  (147.3 cm)  BEHAVIORAL SYMPTOMS/MOOD NEUROLOGICAL BOWEL NUTRITION STATUS      Continent Diet(please see DC summary)  AMBULATORY STATUS COMMUNICATION OF NEEDS Skin   Extensive Assist Verbally Normal                       Personal Care Assistance Level of Assistance  Bathing, Feeding, Dressing Bathing Assistance: Limited assistance Feeding assistance: Independent Dressing Assistance: Limited assistance     Functional Limitations Info  Sight, Hearing, Speech Sight Info: Adequate Hearing Info: Adequate Speech Info: Adequate    SPECIAL CARE FACTORS FREQUENCY  PT (By licensed PT)     PT Frequency: 5x/week    OT 5x/week          Contractures Contractures Info: Not present    Additional Factors Info  Code Status, Allergies Code Status Info: DNR Allergies Info: Sulfa Antibiotics, Sulfamethoxazole, Tape, Amoxicillin, Penicillin G, Penicillins           Current Medications (03/20/2018):  This is the current hospital active medication list Current Facility-Administered Medications  Medication Dose Route Frequency Provider Last Rate Last Dose  . apixaban (ELIQUIS) tablet 2.5 mg  2.5 mg Oral BID Shela Leff, MD   2.5 mg at 03/20/18 0853  . azithromycin (ZITHROMAX) tablet 500 mg  500 mg Oral QHS Elgergawy, Silver Huguenin, MD      . cefTRIAXone (ROCEPHIN) 1 g in sodium chloride 0.9 % 100 mL IVPB  1 g Intravenous Q24H Elgergawy, Silver Huguenin, MD 200 mL/hr at 03/19/18 2015 1 g at 03/19/18 2015  . diltiazem (CARDIZEM CD) 24 hr capsule 120 mg  120 mg Oral Daily Elgergawy, Silver Huguenin, MD  120 mg at 03/20/18 0853  . fluticasone (FLONASE) 50 MCG/ACT nasal spray 2 spray  2 spray Each Nare Daily Shela Leff, MD   2 spray at 03/18/18 0959  . furosemide (LASIX) tablet 20 mg  20 mg Oral Daily Elgergawy, Silver Huguenin, MD   20 mg at 03/20/18 0853  . guaiFENesin (MUCINEX) 12 hr tablet 1,200 mg  1,200 mg Oral BID Elgergawy, Silver Huguenin, MD   1,200 mg at 03/20/18 0853  .  ipratropium-albuterol (DUONEB) 0.5-2.5 (3) MG/3ML nebulizer solution 3 mL  3 mL Nebulization Q6H PRN Shela Leff, MD      . levothyroxine (SYNTHROID, LEVOTHROID) tablet 88 mcg  88 mcg Oral QAC breakfast Shela Leff, MD   88 mcg at 03/20/18 0656  . mometasone-formoterol (DULERA) 200-5 MCG/ACT inhaler 2 puff  2 puff Inhalation BID Shela Leff, MD   2 puff at 03/20/18 0801  . pantoprazole (PROTONIX) EC tablet 40 mg  40 mg Oral Daily Shela Leff, MD   40 mg at 03/20/18 0853  . umeclidinium bromide (INCRUSE ELLIPTA) 62.5 MCG/INH 1 puff  1 puff Inhalation Daily Shela Leff, MD   1 puff at 03/19/18 0804     Discharge Medications: Please see discharge summary for a list of discharge medications.  Relevant Imaging Results:  Relevant Lab Results:   Additional Information SSN: [id number]  Estanislado Emms, LCSW

## 2018-03-20 NOTE — Care Management (Signed)
1. S/  W    RENEE @ [employer] # [phone]   1. ELIQUIS  2.5 MG BID COVER- YES CO-PAY- $ 33.00 TIER- NO PRIOR APPROVAL- NO  2. ELIQUIS  5 MG BID COVER- YES CO-PAY- $ 33.00 TIER-  NO PRIOR APPROVAL- NO  PREFERRED PHARMACY : YES [facility]

## 2018-03-20 NOTE — Progress Notes (Signed)
SATURATION QUALIFICATIONS: (This note is used to comply with regulatory documentation for home oxygen)  Patient Saturations on Room Air at Rest = 82%  Patient Saturations on Room Air while Ambulating = N/A  Patient Saturations on 3 Liters of oxygen while Ambulating = N/A  Please briefly explain why patient needs home oxygen: pt desats on Room air without exertion -

## 2018-03-21 DIAGNOSIS — B351 Tinea unguium: Secondary | ICD-10-CM | POA: Diagnosis not present

## 2018-03-21 DIAGNOSIS — J9601 Acute respiratory failure with hypoxia: Secondary | ICD-10-CM | POA: Diagnosis not present

## 2018-03-21 DIAGNOSIS — I4819 Other persistent atrial fibrillation: Secondary | ICD-10-CM | POA: Diagnosis not present

## 2018-03-21 DIAGNOSIS — D649 Anemia, unspecified: Secondary | ICD-10-CM | POA: Diagnosis not present

## 2018-03-21 DIAGNOSIS — Z9981 Dependence on supplemental oxygen: Secondary | ICD-10-CM | POA: Diagnosis not present

## 2018-03-21 DIAGNOSIS — E871 Hypo-osmolality and hyponatremia: Secondary | ICD-10-CM | POA: Diagnosis not present

## 2018-03-21 DIAGNOSIS — J479 Bronchiectasis, uncomplicated: Secondary | ICD-10-CM | POA: Diagnosis not present

## 2018-03-21 DIAGNOSIS — G459 Transient cerebral ischemic attack, unspecified: Secondary | ICD-10-CM | POA: Diagnosis not present

## 2018-03-21 DIAGNOSIS — M2550 Pain in unspecified joint: Secondary | ICD-10-CM | POA: Diagnosis not present

## 2018-03-21 DIAGNOSIS — N183 Chronic kidney disease, stage 3 (moderate): Secondary | ICD-10-CM | POA: Diagnosis not present

## 2018-03-21 DIAGNOSIS — I480 Paroxysmal atrial fibrillation: Secondary | ICD-10-CM | POA: Diagnosis not present

## 2018-03-21 DIAGNOSIS — J42 Unspecified chronic bronchitis: Secondary | ICD-10-CM | POA: Diagnosis not present

## 2018-03-21 DIAGNOSIS — Z79899 Other long term (current) drug therapy: Secondary | ICD-10-CM | POA: Diagnosis not present

## 2018-03-21 DIAGNOSIS — M810 Age-related osteoporosis without current pathological fracture: Secondary | ICD-10-CM | POA: Diagnosis present

## 2018-03-21 DIAGNOSIS — J189 Pneumonia, unspecified organism: Secondary | ICD-10-CM | POA: Diagnosis not present

## 2018-03-21 DIAGNOSIS — K219 Gastro-esophageal reflux disease without esophagitis: Secondary | ICD-10-CM | POA: Diagnosis not present

## 2018-03-21 DIAGNOSIS — E8770 Fluid overload, unspecified: Secondary | ICD-10-CM | POA: Diagnosis not present

## 2018-03-21 DIAGNOSIS — J918 Pleural effusion in other conditions classified elsewhere: Secondary | ICD-10-CM | POA: Diagnosis not present

## 2018-03-21 DIAGNOSIS — R5383 Other fatigue: Secondary | ICD-10-CM | POA: Diagnosis not present

## 2018-03-21 DIAGNOSIS — N179 Acute kidney failure, unspecified: Secondary | ICD-10-CM | POA: Diagnosis not present

## 2018-03-21 DIAGNOSIS — Z7901 Long term (current) use of anticoagulants: Secondary | ICD-10-CM | POA: Diagnosis not present

## 2018-03-21 DIAGNOSIS — Z79890 Hormone replacement therapy (postmenopausal): Secondary | ICD-10-CM | POA: Diagnosis not present

## 2018-03-21 DIAGNOSIS — R0902 Hypoxemia: Secondary | ICD-10-CM | POA: Diagnosis not present

## 2018-03-21 DIAGNOSIS — J181 Lobar pneumonia, unspecified organism: Secondary | ICD-10-CM | POA: Diagnosis not present

## 2018-03-21 DIAGNOSIS — Z90710 Acquired absence of both cervix and uterus: Secondary | ICD-10-CM | POA: Diagnosis not present

## 2018-03-21 DIAGNOSIS — Z825 Family history of asthma and other chronic lower respiratory diseases: Secondary | ICD-10-CM | POA: Diagnosis not present

## 2018-03-21 DIAGNOSIS — I509 Heart failure, unspecified: Secondary | ICD-10-CM | POA: Diagnosis not present

## 2018-03-21 DIAGNOSIS — Z8701 Personal history of pneumonia (recurrent): Secondary | ICD-10-CM | POA: Diagnosis not present

## 2018-03-21 DIAGNOSIS — H353131 Nonexudative age-related macular degeneration, bilateral, early dry stage: Secondary | ICD-10-CM | POA: Diagnosis not present

## 2018-03-21 DIAGNOSIS — I6529 Occlusion and stenosis of unspecified carotid artery: Secondary | ICD-10-CM | POA: Diagnosis not present

## 2018-03-21 DIAGNOSIS — E43 Unspecified severe protein-calorie malnutrition: Secondary | ICD-10-CM | POA: Diagnosis not present

## 2018-03-21 DIAGNOSIS — J9621 Acute and chronic respiratory failure with hypoxia: Secondary | ICD-10-CM | POA: Diagnosis not present

## 2018-03-21 DIAGNOSIS — M79676 Pain in unspecified toe(s): Secondary | ICD-10-CM | POA: Diagnosis not present

## 2018-03-21 DIAGNOSIS — J441 Chronic obstructive pulmonary disease with (acute) exacerbation: Secondary | ICD-10-CM | POA: Diagnosis not present

## 2018-03-21 DIAGNOSIS — I5032 Chronic diastolic (congestive) heart failure: Secondary | ICD-10-CM | POA: Diagnosis not present

## 2018-03-21 DIAGNOSIS — Z7951 Long term (current) use of inhaled steroids: Secondary | ICD-10-CM | POA: Diagnosis not present

## 2018-03-21 DIAGNOSIS — Z8249 Family history of ischemic heart disease and other diseases of the circulatory system: Secondary | ICD-10-CM | POA: Diagnosis not present

## 2018-03-21 DIAGNOSIS — J90 Pleural effusion, not elsewhere classified: Secondary | ICD-10-CM | POA: Diagnosis not present

## 2018-03-21 DIAGNOSIS — I5033 Acute on chronic diastolic (congestive) heart failure: Secondary | ICD-10-CM | POA: Diagnosis not present

## 2018-03-21 DIAGNOSIS — I959 Hypotension, unspecified: Secondary | ICD-10-CM | POA: Diagnosis not present

## 2018-03-21 DIAGNOSIS — R0602 Shortness of breath: Secondary | ICD-10-CM | POA: Diagnosis not present

## 2018-03-21 DIAGNOSIS — J9612 Chronic respiratory failure with hypercapnia: Secondary | ICD-10-CM | POA: Diagnosis not present

## 2018-03-21 DIAGNOSIS — E039 Hypothyroidism, unspecified: Secondary | ICD-10-CM | POA: Diagnosis present

## 2018-03-21 DIAGNOSIS — E785 Hyperlipidemia, unspecified: Secondary | ICD-10-CM | POA: Diagnosis present

## 2018-03-21 DIAGNOSIS — R2681 Unsteadiness on feet: Secondary | ICD-10-CM | POA: Diagnosis not present

## 2018-03-21 DIAGNOSIS — Z7401 Bed confinement status: Secondary | ICD-10-CM | POA: Diagnosis not present

## 2018-03-21 DIAGNOSIS — I10 Essential (primary) hypertension: Secondary | ICD-10-CM | POA: Diagnosis not present

## 2018-03-21 DIAGNOSIS — R609 Edema, unspecified: Secondary | ICD-10-CM | POA: Diagnosis not present

## 2018-03-21 DIAGNOSIS — C50912 Malignant neoplasm of unspecified site of left female breast: Secondary | ICD-10-CM | POA: Diagnosis not present

## 2018-03-21 DIAGNOSIS — J438 Other emphysema: Secondary | ICD-10-CM | POA: Diagnosis not present

## 2018-03-21 DIAGNOSIS — I4891 Unspecified atrial fibrillation: Secondary | ICD-10-CM | POA: Diagnosis not present

## 2018-03-21 DIAGNOSIS — R000 Tachycardia, unspecified: Secondary | ICD-10-CM | POA: Diagnosis not present

## 2018-03-21 DIAGNOSIS — J440 Chronic obstructive pulmonary disease with acute lower respiratory infection: Secondary | ICD-10-CM | POA: Diagnosis not present

## 2018-03-21 DIAGNOSIS — E032 Hypothyroidism due to medicaments and other exogenous substances: Secondary | ICD-10-CM | POA: Diagnosis not present

## 2018-03-21 DIAGNOSIS — J9611 Chronic respiratory failure with hypoxia: Secondary | ICD-10-CM | POA: Diagnosis not present

## 2018-03-21 DIAGNOSIS — L89002 Pressure ulcer of unspecified elbow, stage 2: Secondary | ICD-10-CM | POA: Diagnosis not present

## 2018-03-21 DIAGNOSIS — Z961 Presence of intraocular lens: Secondary | ICD-10-CM | POA: Diagnosis not present

## 2018-03-21 DIAGNOSIS — M6281 Muscle weakness (generalized): Secondary | ICD-10-CM | POA: Diagnosis not present

## 2018-03-21 DIAGNOSIS — I110 Hypertensive heart disease with heart failure: Secondary | ICD-10-CM | POA: Diagnosis not present

## 2018-03-21 DIAGNOSIS — I130 Hypertensive heart and chronic kidney disease with heart failure and stage 1 through stage 4 chronic kidney disease, or unspecified chronic kidney disease: Secondary | ICD-10-CM | POA: Diagnosis not present

## 2018-03-21 DIAGNOSIS — Z87891 Personal history of nicotine dependence: Secondary | ICD-10-CM | POA: Diagnosis not present

## 2018-03-21 MED ORDER — DILTIAZEM HCL ER COATED BEADS 120 MG PO CP24: 120.0000 mg | ORAL_CAPSULE | Freq: Every day | ORAL | Status: AC

## 2018-03-21 MED ORDER — APIXABAN 2.5 MG PO TABS: 2.5000 mg | ORAL_TABLET | Freq: Two times a day (BID) | ORAL | 0 refills | Status: DC

## 2018-03-21 MED ORDER — GUAIFENESIN ER 600 MG PO TB12: 1200.0000 mg | ORAL_TABLET | Freq: Two times a day (BID) | ORAL | Status: AC

## 2018-03-21 MED ORDER — OFF THE BEAT BOOK
Freq: Once | Status: AC
  Administered 2018-03-21: 09:00:00
  Filled 2018-03-21: qty 1

## 2018-03-21 MED ORDER — FUROSEMIDE 20 MG PO TABS: 20.0000 mg | ORAL_TABLET | ORAL | Status: DC

## 2018-03-21 MED ORDER — CEFPODOXIME PROXETIL 200 MG PO TABS: 200.0000 mg | ORAL_TABLET | Freq: Two times a day (BID) | ORAL | Status: DC

## 2018-03-21 MED ORDER — IPRATROPIUM-ALBUTEROL 0.5-2.5 (3) MG/3ML IN SOLN: 3.0000 mL | Freq: Four times a day (QID) | RESPIRATORY_TRACT | Status: AC | PRN

## 2018-03-21 MED ORDER — AZITHROMYCIN 250 MG PO TABS: ORAL_TABLET | ORAL | 0 refills | Status: DC

## 2018-03-21 NOTE — Progress Notes (Signed)
SATURATION QUALIFICATIONS: (This note is used to comply with regulatory documentation for home oxygen)  Patient Saturations on Room Air at Rest = 87%  Patient Saturations on Room Air while Ambulating = N/A due to desat on RA at rest  Patient Saturations on 4 Liters of oxygen while Ambulating = 84%  Please briefly explain why patient needs home oxygen:Pt desat on RA at rest.  Pt needed 4L to keep sats in 80's with ambulation.  Thanks. [place] Pager:  [phone]  Office:  [phone]

## 2018-03-21 NOTE — Progress Notes (Signed)
Physical Therapy Treatment Patient Details Name: Katherine [name] MRN: [id number] DOB: 02-08-1918 Today's Date: 03/21/2018    History of Present Illness Pt is a 85 y.o. F with significant PMH of CVA, hypertension, COPD who presents for evaluation of shortness of breath. Work-up was significant for pneumonia and volume overload in setting of new onset A. fib with RVR.    PT Comments    Pt admitted with above diagnosis. Pt currently with functional limitations due to the deficits listed below (see PT Problem List). Pt was able to ambulate with RW very short distance with min assist and cues.  Pt desats on RA at rest and needing 4L to keep sats >84%.  Will continue to follow acutely.   Pt will benefit from skilled PT to increase their independence and safety with mobility to allow discharge to the venue listed below.     Follow Up Recommendations  SNF;Supervision/Assistance - 24 hour     Equipment Recommendations  None recommended by PT    Recommendations for Other Services       Precautions / Restrictions Precautions Precautions: Fall Precaution Comments: urinary frequency Restrictions Weight Bearing Restrictions: No    Mobility  Bed Mobility Overal bed mobility: Needs Assistance Bed Mobility: Supine to Sit     Supine to sit: Supervision     General bed mobility comments: increased time  Transfers Overall transfer level: Needs assistance Equipment used: Rolling walker (2 wheeled) Transfers: Sit to/from [facility] Sit to Stand: Mod assist Stand pivot transfers: Min assist       General transfer comment: mod assist provided to stand with increased time required to achieve upright. minA to perform stand pivot from bed to 3N1.  Total assist to wipe pt after she urinated.   Ambulation/Gait Ambulation/Gait assistance: Min assist Gait Distance (Feet): 20 Feet Assistive device: Rolling walker (2 wheeled) Gait Pattern/deviations: Step-through pattern;Decreased  stride length;Shuffle;Drifts right/left;Trunk flexed   Gait velocity interpretation: <1.31 ft/sec, indicative of household ambulator General Gait Details: Pt was able to ambulate with RW with overall fair stability.  Pt needs assist to steer RW at times and needed assist to stay close to rW as well.  Pt desat on RA at rest to 87%.  Did not test without the O2 on.  Needed 4L to keep sats >84% with ambulation.    Stairs             Wheelchair Mobility    Modified Rankin (Stroke Patients Only)       Balance Overall balance assessment: Needs assistance Sitting-balance support: Feet supported Sitting balance-Leahy Scale: Good     Standing balance support: Bilateral upper extremity supported Standing balance-Leahy Scale: Poor Standing balance comment: reliant on external support                            Cognition Arousal/Alertness: Awake/alert Behavior During Therapy: WFL for tasks assessed/performed Overall Cognitive Status: Within Functional Limits for tasks assessed                                        Exercises      General Comments        Pertinent Vitals/Pain Pain Assessment: No/denies pain    Home Living                      Prior  Function            PT Goals (current goals can now be found in the care plan section) Acute Rehab PT Goals Patient Stated Goal: "I can't go back to being independent right now." Progress towards PT goals: Progressing toward goals    Frequency    Min 2X/week      PT Plan Current plan remains appropriate;Frequency needs to be updated    Co-evaluation              AM-PAC PT "6 Clicks" Mobility   Outcome Measure  Help needed turning from your back to your side while in a flat bed without using bedrails?: None Help needed moving from lying on your back to sitting on the side of a flat bed without using bedrails?: A Little Help needed moving to and from a bed to a chair  (including a wheelchair)?: A Lot Help needed standing up from a chair using your arms (e.g., wheelchair or bedside chair)?: A Lot Help needed to walk in hospital room?: A Little Help needed climbing 3-5 steps with a railing? : Total 6 Click Score: 15    End of Session Equipment Utilized During Treatment: Gait belt;Oxygen Activity Tolerance: Patient tolerated treatment well Patient left: with call bell/phone within reach;in bed;with bed alarm set Nurse Communication: Mobility status PT Visit Diagnosis: Unsteadiness on feet (R26.81);Muscle weakness (generalized) (M62.81);Difficulty in walking, not elsewhere classified (R26.2)     Time: 4696-2952 PT Time Calculation (min) (ACUTE ONLY): 24 min  Charges:  $Gait Training: 8-22 mins $Self Care/Home Management: 8-22                     [place] Pager:  [phone]  Office:  [place] 03/21/2018, 10:47 AM

## 2018-03-21 NOTE — Consult Note (Signed)
            [facility] CM Primary Care Navigator  03/21/2018  VEE BAHE 1917/07/27 [id number]   Went to see patient in the room to identify possible discharge needs but she was already discharged per RN report. [facility]   Patient presented to the hospital for evaluation of shortness of breath even at rest, also with chills, cough and bilateral lower extremity edema. (acute on chronic respiratory failure with hypoxia, CAP- community acquired pneumonia, acute on chronic diastolic congestive HF, atrial fibrillation)  Discharge planisSNF- skilled nursing facility per therapy recommendation. [facility]prior to returning back home- to the Independent living side.   Primary care provider's is listed as providing transition of care (TOC) follow-up.   For additional questions please contact:  Edwena Felty A. Darya Bigler, BSN, RN-BC [facility] PRIMARY CARE Navigator Cell: [phone]

## 2018-03-21 NOTE — Discharge Summary (Signed)
Katherine [name], is a 85 y.o. female  DOB 1917/10/23  MRN [id number].  Admission date:  03/17/2018  Admitting Physician  Shela Leff, MD  Discharge Date:  03/21/2018   Primary MD  Lavone Orn, MD  Recommendations for primary care physician for things to follow:  -Check two-view chest x-ray in 3 to 4 weeks to ensure resolution of pneumonia -Check CBC, BMP in 3 days -Currently on 2 L nasal cannula continuous, wean as tolerated to her baseline which is nasal cannula at nighttime -encourage to incentive spirometry and flutter valve  Admission Diagnosis  Acute on chronic respiratory failure with hypoxia ([place]) [J96.21]   Discharge Diagnosis  Acute on chronic respiratory failure with hypoxia ([place]) [J96.21]    Principal Problem:   Acute on chronic respiratory failure with hypoxia ([place]) Active Problems:   CAP (community acquired pneumonia)   COPD (chronic obstructive pulmonary disease) ([place])   New onset atrial fibrillation (HCC)   Volume overload   Chronic anemia   CKD (chronic kidney disease) stage 3, GFR 30-59 ml/min (HCC)      Past Medical History:  Diagnosis Date  . Arthritis   . Constipation   . COPD (chronic obstructive pulmonary disease) ([place])   . Fatigue   . Frequent UTI   . GERD (gastroesophageal reflux disease)   . History of shingles 2007  . Hyperlipemia   . Hypertension   . Hypothyroidism   . Multiple allergies   . Osteoporosis   . Paget's carcinoma of the nipple ([place]) 03/16/2013  . Pneumonia   . Seasonal allergies   . Stroke [facility]) 2010   no residual  . Thyroid disease   . Uterine cancer [facility])     Past Surgical History:  Procedure Laterality Date  . ABDOMINAL HYSTERECTOMY    . BREAST LUMPECTOMY     left breast  . CAROTID ENDARTERECTOMY Left June 10, 2008   CE  . EYE SURGERY     cataracts removed bilaterally  . TONSILLECTOMY         History of present illness  and  Hospital Course:     Kindly see H&P for history of present illness and admission details, please review complete Labs, Consult reports and Test reports for all details in brief  HPI  from the history and physical done on the day of admission 03/17/2018   HPI: Katherine [name] is a 85 y.o. female with medical history significant of CVA, hypothyroidism, hypertension, hyperlipidemia, GERD, COPD presenting to the hospital for evaluation of shortness of breath.  Patient reports having a 1 day history of shortness of breath even at rest.  Reports having chills and cough.  Denies having any heart palpitations or chest pain.  Denies having any orthopnea.  Reports having bilateral lower extremity edema for several months and is not sure if it has been worse recently.  Denies having any body aches, headaches, nausea, or vomiting.  She uses 2 L home oxygen at night.    Hospital Course   85 y.o.femalewith medical  history significant ofCVA, hypothyroidism, hypertension, hyperlipidemia, GERD, COPD presenting to the hospital for evaluation of shortness of breath.  He had worsening lower extremity edema, her work-up was significant for up pneumonia, volume overload or new onset A. fib with RVR.    2D echo showing preserved EF, she converted to normal sinus rhythm on admission, start on anticoagulation.  Community-acquired pneumonia -X-ray significant for worsening right upper opacity, she had leukocytosis, with cough, productive sputum, treated with IV Rocephin and azithromycin, be discharged on 1 day of azithromycin and Vantin to finish total of 5 days treatment, encouraged use incentive spirometry and flutter valve today, will take them to facility and continue using them, she will be Mucinex as well -Patient with baseline 2 L nasal cannula at nighttime, still requiring oxygen daytime, courage to use and flutter valve incentive spirometry, she will be discharged on oxygen 24/7, wean back to baseline on  oxygen nightly  Acute on chronic diastolic CHF -2D echo showing preserved EF, grade 2 diastolic dysfunction, has volume overload on imaging, as well lower extremity edema, possibly uncontrolled heart rate on presentation contributing to it,  required IV Lasix initially, she will be discharged low-dose Lasix 20 mg every other day   paroxysmal  A. fib -New onset, currently converted back to normal sinus rhythm, started on Cardizem CD120 mg oral daily, other antihypertensive medication has been held for soft blood pressure. - CHA2DS2VASc 6, heart is on Eliquis, denies any history of falls  Hypokalemia -repleted   Anemia, unspecified -Hemoglobin 11.3, at baseline.  CKD 3 -Creatinine 1.0, at baseline.  COPD -Stable. No bronchospasm. DuoNebs as needed.  Hx of non hemorrhagic CVA -Now she is on Eliquis   Discharge Condition:  stable     Discharge Instructions  and  Discharge Medications     Discharge Instructions    Diet - low sodium heart healthy   Complete by:  As directed    Discharge instructions   Complete by:  As directed    Follow with Primary MD Lavone Orn, MD or SNF physician  Get CBC, CMP,  checked  by Primary MD next visit.    Activity: As tolerated with Full fall precautions use walker/cane & assistance as needed   Disposition SNF   Diet: Heart Healthy  , with feeding assistance and aspiration precautions. For Heart failure patients - Check your Weight same time everyday, if you gain over 2 pounds, or you develop in leg swelling, experience more shortness of breath or chest pain, call your Primary MD immediately. Follow Cardiac Low Salt Diet and 1.5 lit/day fluid restriction.   On your next visit with your primary care physician please Get Medicines reviewed and adjusted.   Please request your Prim.MD to go over all Hospital Tests and Procedure/Radiological results at the follow up, please get all Hospital records sent to your Prim MD by  signing hospital release before you go home.   If you experience worsening of your admission symptoms, develop shortness of breath, life threatening emergency, suicidal or homicidal thoughts you must seek medical attention immediately by calling 911 or calling your MD immediately  if symptoms less severe.  You Must read complete instructions/literature along with all the possible adverse reactions/side effects for all the Medicines you take and that have been prescribed to you. Take any new Medicines after you have completely understood and accpet all the possible adverse reactions/side effects.   Do not drive, operating heavy machinery, perform activities at heights, swimming or participation in water  activities or provide baby sitting services if your were admitted for syncope or siezures until you have seen by Primary MD or a Neurologist and advised to do so again.  Do not drive when taking Pain medications.    Do not take more than prescribed Pain, Sleep and Anxiety Medications  Special Instructions: If you have smoked or chewed Tobacco  in the last 2 yrs please stop smoking, stop any regular Alcohol  and or any Recreational drug use.  Wear Seat belts while driving.   Please note  You were cared for by a hospitalist during your hospital stay. If you have any questions about your discharge medications or the care you received while you were in the hospital after you are discharged, you can call the unit and asked to speak with the hospitalist on call if the hospitalist that took care of you is not available. Once you are discharged, your primary care physician will handle any further medical issues. Please note that NO REFILLS for any discharge medications will be authorized once you are discharged, as it is imperative that you return to your primary care physician (or establish a relationship with a primary care physician if you do not have one) for your aftercare needs so that they can  reassess your need for medications and monitor your lab values.   Increase activity slowly   Complete by:  As directed      Allergies as of 03/21/2018      Reactions   Sulfa Antibiotics Shortness Of Breath, Palpitations   Sulfamethoxazole Shortness Of Breath, Palpitations   Tape Other (See Comments)   SKIN IS VERY FRAGILE- PLEASE USE AN ALTERNATIVE TO TAPE, AS THE SKIN TEARS EASILY!!   Amoxicillin Rash   Penicillin G Rash   Did it involve swelling of the face/tongue/throat, SOB, or low BP? Yes Did it involve sudden or severe rash/hives, skin peeling, or any reaction on the inside of your mouth or nose? Unk Did you need to seek medical attention at a hospital or doctor's office? Unk When did it last happen? "it was a long time ago" If all above answers are "NO", may proceed with cephalosporin use.   Penicillins Rash   Did it involve swelling of the face/tongue/throat, SOB, or low BP? Yes Did it involve sudden or severe rash/hives, skin peeling, or any reaction on the inside of your mouth or nose? Unk Did you need to seek medical attention at a hospital or doctor's office? Unk When did it last happen? "it was a long time ago" If all above answers are "NO", may proceed with cephalosporin use.      Medication List    STOP taking these medications   amLODipine 5 MG tablet Commonly known as:  NORVASC   aspirin EC 81 MG tablet   valsartan-hydrochlorothiazide 160-12.5 MG tablet Commonly known as:  DIOVAN-HCT     TAKE these medications   apixaban 2.5 MG Tabs tablet Commonly known as:  ELIQUIS Take 1 tablet (2.5 mg total) by mouth 2 (two) times daily.   azithromycin 250 MG tablet Commonly known as:  ZITHROMAX PLEASE TAKE 1 TABLET THIS EVENING   cefpodoxime 200 MG tablet Commonly known as:  VANTIN Take 1 tablet (200 mg total) by mouth 2 (two) times daily. PLEASE TAKE FOR 1 DAY, THEN STOP   SYSTANE ICAPS AREDS2 Caps Take 1 capsule by mouth daily.   CENTRUM SILVER PO Take 1  tablet by mouth daily.   diltiazem 120 MG 24  hr capsule Commonly known as:  CARDIZEM CD Take 1 capsule (120 mg total) by mouth daily. Start taking on:  March 22, 2018   fluticasone 50 MCG/ACT nasal spray Commonly known as:  FLONASE Place 2 sprays into the nose daily.   Fluticasone-Salmeterol 250-50 MCG/DOSE Aepb Commonly known as:  ADVAIR Inhale 1 puff into the lungs every 12 (twelve) hours.   furosemide 20 MG tablet Commonly known as:  LASIX Take 1 tablet (20 mg total) by mouth every other day.   guaiFENesin 600 MG 12 hr tablet Commonly known as:  MUCINEX Take 2 tablets (1,200 mg total) by mouth 2 (two) times daily for 14 days.   ipratropium-albuterol 0.5-2.5 (3) MG/3ML Soln Commonly known as:  DUONEB Take 3 mLs by nebulization every 6 (six) hours as needed (WHEEZING).   levothyroxine 88 MCG tablet Commonly known as:  SYNTHROID, LEVOTHROID Take 1 tablet (88 mcg total) by mouth daily before breakfast.   pantoprazole 40 MG tablet Commonly known as:  PROTONIX Take 40 mg by mouth daily.   tiotropium 18 MCG inhalation capsule Commonly known as:  SPIRIVA Place 18 mcg into inhaler and inhale daily.   Vitamin D (Ergocalciferol) 1.25 MG (50000 UT) Caps capsule Commonly known as:  DRISDOL Take 50,000 Units by mouth every Sunday.         Diet and Activity recommendation: See Discharge Instructions above   Consults obtained -  None   Major procedures and Radiology Reports - PLEASE review detailed and final reports for all details, in brief -    Dg Chest 2 View  Result Date: 03/17/2018 CLINICAL DATA:  Irregular heartbeat and fatigue for the past 2 days. EXAM: CHEST - 2 VIEW COMPARISON:  Chest x-ray dated December 20, 2016. FINDINGS: The patient is rotated to the right, limiting evaluation. Stable cardiomediastinal silhouette. Normal pulmonary vascularity. Slightly increased patchy opacity in the right upper lobe. New trace bilateral pleural effusions. No pneumothorax.  No acute osseous abnormality. IMPRESSION: 1. Slightly increased patchy opacity in the right upper lobe may be related to rotation, but could reflect pneumonia. 2. New trace bilateral pleural effusions. Electronically Signed   By: Titus Dubin M.D.   On: 03/17/2018 17:24    Micro Results     No results found for this or any previous visit (from the past 240 hour(s)).     Today   Subjective:   Catlynn Grondahl today has no headache,no chest or abdominal pain, started about being discharged today, reports her cough has improved, nonproductive, with any dyspnea .   Objective:   Blood pressure (!) 119/47, pulse 63, temperature 97.6 F (36.4 C), temperature source Oral, resp. rate (!) 25, height 4\' 10"  (1.473 m), weight 45.2 kg, SpO2 94 %.   Intake/Output Summary (Last 24 hours) at 03/21/2018 1009 Last data filed at 03/21/2018 0935 Gross per 24 hour  Intake 360 ml  Output 1650 ml  Net -1290 ml    Exam Awake Alert, Oriented x 3, No new F.N deficits, Normal affect, frail, sitting at the edge of the bed Symmetrical Chest wall movement, Good air movement bilaterally, CTAB RRR,No Gallops,Rubs or new Murmurs, No Parasternal Heave +ve B.Sounds, Abd Soft, Non tender,  No rebound -guarding or rigidity. No Cyanosis, Clubbing or edema, No new Rash or bruise  Data Review   CBC w Diff:  Lab Results  Component Value Date   WBC 12.0 (H) 03/19/2018   HGB 9.0 (L) 03/19/2018   HGB 11.0 (L) 03/21/2014   HCT 27.7 (  L) 03/19/2018   HCT 33.0 (L) 03/21/2014   PLT 205 03/19/2018   PLT 483 (H) 03/21/2014   LYMPHOPCT 12 03/17/2018   LYMPHOPCT 25.0 03/21/2014   MONOPCT 12 03/17/2018   MONOPCT 8.8 03/21/2014   EOSPCT 1 03/17/2018   EOSPCT 1.4 03/21/2014   BASOPCT 0 03/17/2018   BASOPCT 0.4 03/21/2014    CMP:  Lab Results  Component Value Date   NA 134 (L) 03/19/2018   NA 138 03/21/2014   K 4.1 03/19/2018   K 4.3 03/21/2014   CL 100 03/19/2018   CO2 24 03/19/2018   CO2 28 03/21/2014    BUN 19 03/19/2018   BUN 14.9 03/21/2014   CREATININE 0.86 03/19/2018   CREATININE 1.1 03/21/2014   PROT 6.4 (L) 03/17/2018   PROT 6.3 (L) 03/21/2014   ALBUMIN 3.1 (L) 03/17/2018   ALBUMIN 3.1 (L) 03/21/2014   BILITOT 1.0 03/17/2018   BILITOT 0.28 03/21/2014   ALKPHOS 56 03/17/2018   ALKPHOS 85 03/21/2014   AST 21 03/17/2018   AST 13 03/21/2014   ALT 11 03/17/2018   ALT 9 03/21/2014  .   Total Time in preparing paper work, data evaluation and todays exam - 84 minutes  Phillips Climes M.D on 03/21/2018 at 10:09 AM  [facility]   Office  [phone]

## 2018-03-21 NOTE — Social Work (Signed)
Patient will discharge to [facility] Anticipated discharge date: 03/21/18 Family notified: Little Ishikawa, niece Transportation by: [facility]  Nurse to call report to [phone]. Patient will go to room #39 at the facility.  CSW signing off.  Estanislado Emms, [place]  Clinical Social Worker

## 2018-03-21 NOTE — Care Management Important Message (Signed)
Important Message  Patient Details  Name: Katherine [name] MRN: [id number] Date of Birth: 1917/06/07   Medicare Important Message Given:  Yes    Shar Paez P [place] 03/21/2018, 5:18 PM

## 2018-03-21 NOTE — Clinical Social Work Placement (Signed)
   CLINICAL SOCIAL WORK PLACEMENT  NOTE  Date:  03/21/2018  Patient Details  Name: Katherine [name] MRN: [id number] Date of Birth: [birth year]  Clinical Social Work is seeking post-discharge placement for this patient at the [place] level of care (*CSW will initial, date and re-position this form in  chart as items are completed):  Yes   Patient/family provided with [facility] Work Department's list of facilities offering this level of care within the geographic area requested by the patient (or if unable, by the patient's family).  Yes   Patient/family informed of their freedom to choose among providers that offer the needed level of care, that participate in Medicare, Medicaid or managed care program needed by the patient, have an available bed and are willing to accept the patient.  Yes   Patient/family informed of [facility]'s ownership interest in [facility] and [facility], as well as of the fact that they are under no obligation to receive care at these facilities.  PASRR submitted to EDS on       PASRR number received on       Existing PASRR number confirmed on 03/20/18     FL2 transmitted to all facilities in geographic area requested by pt/family on 03/20/18     FL2 transmitted to all facilities within larger geographic area on       Patient informed that his/her managed care company has contracts with or will negotiate with certain facilities, including the following:  [place]         Patient/family informed of bed offers received.  Patient chooses bed at [facility]     Physician recommends and patient chooses bed at      Patient to be transferred to [facility] on 03/21/18.  Patient to be transferred to facility by [facility]     Patient family notified on 03/21/18 of transfer.  Name of family member notified:  Little Ishikawa, niece     PHYSICIAN Please prepare priority discharge summary, including  medications, Please prepare prescriptions, Please sign DNR, Please sign FL2     Additional Comment:    _______________________________________________ Estanislado Emms, LCSW 03/21/2018, 10:16 AM

## 2018-03-22 ENCOUNTER — Ambulatory Visit: Payer: Medicare Other | Admitting: Podiatry

## 2018-03-22 ENCOUNTER — Non-Acute Institutional Stay (SKILLED_NURSING_FACILITY): Payer: Medicare Other | Admitting: Internal Medicine

## 2018-03-22 ENCOUNTER — Encounter: Payer: Self-pay | Admitting: Internal Medicine

## 2018-03-22 DIAGNOSIS — J42 Unspecified chronic bronchitis: Secondary | ICD-10-CM | POA: Diagnosis not present

## 2018-03-22 DIAGNOSIS — E785 Hyperlipidemia, unspecified: Secondary | ICD-10-CM | POA: Diagnosis not present

## 2018-03-22 DIAGNOSIS — J181 Lobar pneumonia, unspecified organism: Secondary | ICD-10-CM | POA: Diagnosis not present

## 2018-03-22 DIAGNOSIS — N183 Chronic kidney disease, stage 3 unspecified: Secondary | ICD-10-CM

## 2018-03-22 DIAGNOSIS — D649 Anemia, unspecified: Secondary | ICD-10-CM

## 2018-03-22 DIAGNOSIS — E039 Hypothyroidism, unspecified: Secondary | ICD-10-CM

## 2018-03-22 DIAGNOSIS — I4891 Unspecified atrial fibrillation: Secondary | ICD-10-CM | POA: Diagnosis not present

## 2018-03-22 DIAGNOSIS — J189 Pneumonia, unspecified organism: Secondary | ICD-10-CM

## 2018-03-22 DIAGNOSIS — C50912 Malignant neoplasm of unspecified site of left female breast: Secondary | ICD-10-CM | POA: Diagnosis not present

## 2018-03-22 NOTE — Progress Notes (Signed)
Provider:   Location:  [place] Room Number: 84 Place of Service:  SNF (31)  PCP: Lavone Orn, MD Patient Care Team: Lavone Orn, MD as PCP - General (Internal Medicine) Nicholas Lose, MD as Consulting Physician (Hematology and Oncology)  Extended Emergency Contact Information Primary Emergency Contact: Little Ishikawa Mobile Phone: [phone] Relation: Niece  Code Status: DNR Goals of Care: Advanced Directive information Advanced Directives 03/22/2018  Does Patient Have a Medical Advance Directive? No  Type of Advance Directive -  Does patient want to make changes to medical advance directive? No - Patient declined  Copy of [place] in Chart? -  Pre-existing out of facility DNR order (yellow form or pink MOST form) -      Chief Complaint  Patient presents with  . New Admit To SNF    new admit to facility    HPI: Patient is a 85 y.o. female seen today for admission to SNF after stating in the Hospital from 01/31-02/04 for Community Acquired Pneumonia, and New Onset A.Fib  Patient has h/o H/o TIA in 2010 s/p CEA in 2010, COPD on oxygen at night, Hypertension,GERD,Hypothyroidism, Gastritis and Paget's disease of Nipple  Patient had  noticed progressively getting short of breath, weakness and lower extremity edema for few days. She send to the ED for further Eval. In the hospital she was diagnosed with community-acquired pneumonia with right upper lobe Infiltrate.  She was treated with Rocephin and azithromycin and was discharged on cephalosporin.  She is also on oxygen at this time She also had mild CHF and was treated with low-dose of Lasix She also had a new onset of paroxysmal A. Fib.  she was started on Cardizem and Eliquis She says she is feeling much better.  [place] was in [place] and follows with her PCP.  She walks with a walker and needs no help with her ADLs. She is already working with therapy and is able to walk few feet with  walker. Eyes any cough, chest pain or shortness of breath  Past Medical History:  Diagnosis Date  . Arthritis   . Constipation   . COPD (chronic obstructive pulmonary disease) ([place])   . Fatigue   . Frequent UTI   . GERD (gastroesophageal reflux disease)   . History of shingles 2007  . Hyperlipemia   . Hypertension   . Hypothyroidism   . Multiple allergies   . Osteoporosis   . Paget's carcinoma of the nipple ([place]) 03/16/2013  . Pneumonia   . Seasonal allergies   . Stroke [facility]) 2010   no residual  . Thyroid disease   . Uterine cancer [facility])    Past Surgical History:  Procedure Laterality Date  . ABDOMINAL HYSTERECTOMY    . BREAST LUMPECTOMY     left breast  . CAROTID ENDARTERECTOMY Left June 10, 2008   CE  . EYE SURGERY     cataracts removed bilaterally  . TONSILLECTOMY      reports that she quit smoking about 37 years ago. Her smoking use included cigarettes. She has a 40.00 pack-year smoking history. She has never used smokeless tobacco. She reports current alcohol use. She reports that she does not use drugs. Social History   Socioeconomic History  . Marital status: Widowed    Spouse name: Not on file  . Number of children: Not on file  . Years of education: Not on file  . Highest education level: Not on file  Occupational History  .  Not on file  Social Needs  . Financial resource strain: Not on file  . Food insecurity:    Worry: Not on file    Inability: Not on file  . Transportation needs:    Medical: Not on file    Non-medical: Not on file  Tobacco Use  . Smoking status: Former Smoker    Packs/day: 1.00    Years: 40.00    Pack years: 40.00    Types: Cigarettes    Last attempt to quit: 02/15/1981    Years since quitting: 37.1  . Smokeless tobacco: Never Used  Substance and Sexual Activity  . Alcohol use: Yes  . Drug use: No  . Sexual activity: Not Currently  Lifestyle  . Physical activity:    Days per week: Not on file    Minutes per session: Not  on file  . Stress: Not on file  Relationships  . Social connections:    Talks on phone: Not on file    Gets together: Not on file    Attends religious service: Not on file    Active member of club or organization: Not on file    Attends meetings of clubs or organizations: Not on file    Relationship status: Not on file  . Intimate partner violence:    Fear of current or ex partner: Not on file    Emotionally abused: Not on file    Physically abused: Not on file    Forced sexual activity: Not on file  Other Topics Concern  . Not on file  Social History Narrative   Lives at [place] Status Survey:    Family History  Problem Relation Age of Onset  . Heart failure Mother   . Heart disease Mother   . Heart failure Father   . Heart disease Father   . Heart disease Brother   . Asthma Paternal Grandmother     Health Maintenance  Topic Date Due  . Samul Dada  10/03/1936  . PNA vac Low Risk Adult (1 of 2 - PCV13) 10/02/1984  . INFLUENZA VACCINE  09/15/2017  . DEXA SCAN  Completed    Allergies  Allergen Reactions  . Sulfa Antibiotics Shortness Of Breath and Palpitations  . Sulfamethoxazole Shortness Of Breath and Palpitations  . Tape Other (See Comments)    SKIN IS VERY FRAGILE- PLEASE USE AN ALTERNATIVE TO TAPE, AS THE SKIN TEARS EASILY!!  . Amoxicillin Rash  . Penicillin G Rash    Did it involve swelling of the face/tongue/throat, SOB, or low BP? Yes Did it involve sudden or severe rash/hives, skin peeling, or any reaction on the inside of your mouth or nose? Unk Did you need to seek medical attention at a hospital or doctor's office? Unk When did it last happen? "it was a long time ago" If all above answers are "NO", may proceed with cephalosporin use.   Marland Kitchen Penicillins Rash    Did it involve swelling of the face/tongue/throat, SOB, or low BP? Yes Did it involve sudden or severe rash/hives, skin peeling, or any reaction on the inside of your  mouth or nose? Unk Did you need to seek medical attention at a hospital or doctor's office? Unk When did it last happen? "it was a long time ago" If all above answers are "NO", may proceed with cephalosporin use.     Outpatient Encounter Medications as of 03/22/2018  Medication Sig  . apixaban (ELIQUIS) 2.5 MG TABS tablet Take  1 tablet (2.5 mg total) by mouth 2 (two) times daily.  Marland Kitchen diltiazem (CARDIZEM CD) 120 MG 24 hr capsule Take 1 capsule (120 mg total) by mouth daily.  . fluticasone (FLONASE) 50 MCG/ACT nasal spray Place 2 sprays into the nose daily.  . Fluticasone-Salmeterol (ADVAIR) 250-50 MCG/DOSE AEPB Inhale 1 puff into the lungs every 12 (twelve) hours.  . furosemide (LASIX) 20 MG tablet Take 1 tablet (20 mg total) by mouth every other day.  Marland Kitchen guaiFENesin (MUCINEX) 600 MG 12 hr tablet Take 2 tablets (1,200 mg total) by mouth 2 (two) times daily for 14 days.  Marland Kitchen ipratropium-albuterol (DUONEB) 0.5-2.5 (3) MG/3ML SOLN Take 3 mLs by nebulization every 6 (six) hours as needed (WHEEZING).  Marland Kitchen levothyroxine (SYNTHROID, LEVOTHROID) 88 MCG tablet Take 1 tablet (88 mcg total) by mouth daily before breakfast.  . Multiple Vitamins-Minerals (CENTRUM SILVER PO) Take 1 tablet by mouth daily.  . Multiple Vitamins-Minerals (SYSTANE ICAPS AREDS2) CAPS Take 1 capsule by mouth daily.   . pantoprazole (PROTONIX) 40 MG tablet Take 40 mg by mouth daily.   Marland Kitchen tiotropium (SPIRIVA) 18 MCG inhalation capsule Place 18 mcg into inhaler and inhale daily.  . Vitamin D, Ergocalciferol, (DRISDOL) 50000 UNITS CAPS Take 50,000 Units by mouth every Sunday.   . [DISCONTINUED] azithromycin (ZITHROMAX) 250 MG tablet PLEASE TAKE 1 TABLET THIS EVENING  . [DISCONTINUED] cefpodoxime (VANTIN) 200 MG tablet Take 1 tablet (200 mg total) by mouth 2 (two) times daily. PLEASE TAKE FOR 1 DAY, THEN STOP   No facility-administered encounter medications on file as of 03/22/2018.     Review of Systems  Constitutional: Positive for  activity change.  HENT: Negative.   Respiratory: Positive for cough. Negative for shortness of breath.   Cardiovascular: Negative for leg swelling.  Gastrointestinal: Negative.   Genitourinary: Negative.   Musculoskeletal: Negative.   Skin: Negative.   Neurological: Positive for weakness.  Psychiatric/Behavioral: Negative.   All other systems reviewed and are negative.   Vitals:   03/22/18 0834  BP: (!) 120/53  Pulse: 63  Resp: 18  Temp: 98 F (36.7 C)  SpO2: 95%  Weight: 99 lb (44.9 kg)   Body mass index is 20.69 kg/m. Physical Exam Vitals signs reviewed.  Constitutional:      Appearance: Normal appearance.  HENT:     Head: Normocephalic.     Nose: Nose normal.     Mouth/Throat:     Mouth: Mucous membranes are moist.     Pharynx: Oropharynx is clear.  Eyes:     Pupils: Pupils are equal, round, and reactive to light.  Neck:     Musculoskeletal: Neck supple.  Cardiovascular:     Rate and Rhythm: Normal rate and regular rhythm.     Pulses: Normal pulses.     Heart sounds: Normal heart sounds. No murmur.  Pulmonary:     Effort: Pulmonary effort is normal. No respiratory distress.     Breath sounds: Normal breath sounds. No wheezing or rales.  Abdominal:     General: Abdomen is flat. Bowel sounds are normal. There is no distension.     Palpations: Abdomen is soft.     Tenderness: There is no abdominal tenderness. There is no guarding.  Musculoskeletal:        General: No swelling.  Skin:    General: Skin is warm and dry.  Neurological:     General: No focal deficit present.     Mental Status: She is alert and oriented to person,  place, and time.  Psychiatric:        Mood and Affect: Mood normal.        Thought Content: Thought content normal.        Judgment: Judgment normal.     Labs reviewed: Basic Metabolic Panel: Recent Labs    03/17/18 1604 03/17/18 1753 03/18/18 0505 03/19/18 0436  NA 134*  --  136 134*  K 3.7  --  3.0* 4.1  CL 99  --  99  100  CO2 23  --  25 24  GLUCOSE 98  --  99 117*  BUN 19  --  18 19  CREATININE 1.04*  --  1.08* 0.86  CALCIUM 9.0  --  8.4* 8.1*  MG  --  1.7  --   --    Liver Function Tests: Recent Labs    03/17/18 1604  AST 21  ALT 11  ALKPHOS 56  BILITOT 1.0  PROT 6.4*  ALBUMIN 3.1*   No results for input(s): LIPASE, AMYLASE in the last 8760 hours. No results for input(s): AMMONIA in the last 8760 hours. CBC: Recent Labs    03/17/18 1604 03/18/18 0505 03/19/18 0436  WBC 17.1* 13.2* 12.0*  NEUTROABS 12.8*  --   --   HGB 11.3* 9.9* 9.0*  HCT 34.5* 28.9* 27.7*  MCV 93.8 93.5 93.6  PLT 253 223 205   Cardiac Enzymes: Recent Labs    03/17/18 1604  TROPONINI <0.03   BNP: Invalid input(s): POCBNP No results found for: HGBA1C Lab Results  Component Value Date   TSH 0.983 03/18/2018   Lab Results  Component Value Date   [id number] 650 07/25/2011   No results found for: FOLATE No results found for: IRON, TIBC, FERRITIN  Imaging and Procedures obtained prior to SNF admission: Dg Chest 2 View  Result Date: 03/17/2018 CLINICAL DATA:  Irregular heartbeat and fatigue for the past 2 days. EXAM: CHEST - 2 VIEW COMPARISON:  Chest x-ray dated December 20, 2016. FINDINGS: The patient is rotated to the right, limiting evaluation. Stable cardiomediastinal silhouette. Normal pulmonary vascularity. Slightly increased patchy opacity in the right upper lobe. New trace bilateral pleural effusions. No pneumothorax. No acute osseous abnormality. IMPRESSION: 1. Slightly increased patchy opacity in the right upper lobe may be related to rotation, but could reflect pneumonia. 2. New trace bilateral pleural effusions. Electronically Signed   By: Titus Dubin M.D.   On: 03/17/2018 17:24    Assessment/Plan  Pneumonia of RU lobe Finishing her Antibiotics Doing well  New Onset Atrial Fibrilation Normal Sinus on Cardizem and was started on Eliquis CHF Diastolic On Lasix No LE edema Repeat Chest  Xray in 4 weeks to follow her Pleural Effusion Her other BP Meds were stopped  COPD On Oxygen right now . On Advair Oxygen 2l  Hypothyroidism Normal in hospital  Same dose of Levothyroxine CKD (chronic kidney disease) Creat stable Repeat BMP to follow creat Paget's disease and intraductal carcinoma of left breast  Follows With Oncology Chronic anemia Will Follow CBC Deconditioning Start Therapy Planning to go back to her Apartment   Family/ staff Communication:   Labs/tests ordered: Total time spent in this patient care encounter was 45_ minutes; greater than 50% of the visit spent counseling patient, reviewing records , Labs and coordinating care for problems addressed at this encounter.

## 2018-03-24 LAB — CBC AND DIFFERENTIAL
HCT: 28 — AB (ref 36–46)
Hemoglobin: 9.5 — AB (ref 12.0–16.0)
Platelets: 439 — AB (ref 150–399)
WBC: 8.6

## 2018-03-24 LAB — BASIC METABOLIC PANEL
BUN: 26 — AB (ref 4–21)
Creatinine: 1 (ref <=1.1)
Glucose: 96
POTASSIUM: 4.8 (ref 3.4–5.3)
Sodium: 137 (ref 137–147)

## 2018-03-28 ENCOUNTER — Other Ambulatory Visit: Payer: Self-pay | Admitting: *Deleted

## 2018-03-28 LAB — BASIC METABOLIC PANEL
Calcium: 8.9
Carbon Dioxide, Total: 27
Chloride: 103

## 2018-03-30 LAB — CBC AND DIFFERENTIAL
HCT: 27 — AB (ref 36–46)
Hemoglobin: 9.3 — AB (ref 12.0–16.0)
PLATELETS: 446 — AB (ref 150–399)
WBC: 6.9

## 2018-04-03 ENCOUNTER — Non-Acute Institutional Stay (SKILLED_NURSING_FACILITY): Payer: Medicare Other | Admitting: Nurse Practitioner

## 2018-04-03 ENCOUNTER — Encounter: Payer: Self-pay | Admitting: Nurse Practitioner

## 2018-04-03 DIAGNOSIS — I4891 Unspecified atrial fibrillation: Secondary | ICD-10-CM | POA: Diagnosis not present

## 2018-04-03 DIAGNOSIS — R609 Edema, unspecified: Secondary | ICD-10-CM | POA: Diagnosis not present

## 2018-04-03 DIAGNOSIS — D649 Anemia, unspecified: Secondary | ICD-10-CM | POA: Diagnosis not present

## 2018-04-03 NOTE — Progress Notes (Signed)
Location:  [place] Room Number: 69 Place of Service:  SNF (31) Provider:  Marlana Latus  NP  [name], MD  Patient Care Team: [name], MD as PCP - General (Internal Medicine) Nicholas Lose, MD as Consulting Physician (Hematology and Oncology)  Extended Emergency Contact Information Primary Emergency Contact: Little Ishikawa Mobile Phone: [phone] Relation: Niece  Code Status:  DNR Goals of care: Advanced Directive information Advanced Directives 03/22/2018  Does Patient Have a Medical Advance Directive? Yes  Type of Paramedic of [place];Out of facility DNR (pink MOST or yellow form)  Does patient want to make changes to medical advance directive? No - Patient declined  Copy of [place] in Chart? No - copy requested  Pre-existing out of facility DNR order (yellow form or pink MOST form) -     Chief Complaint  Patient presents with  . Acute Visit    anemia    HPI:  Pt is a 85 y.o. female seen today for an acute visit for the patient has Hgb 9.5 03/24/18, repeated Hgb 9.3 03/30/18. The patient denied nausea, vomiting, abd pain, upset stomach, blood stool, or blood in urine. Afib, heart rate is in control, on Eliquis 2.5mg  bid. Diltiazem 120mg  qd. The patient reported swelling in BLE, she denied paroxysmal nocturnal dyspnea.    Past Medical History:  Diagnosis Date  . Arthritis   . Constipation   . COPD (chronic obstructive pulmonary disease) ([place])   . Fatigue   . Frequent UTI   . GERD (gastroesophageal reflux disease)   . History of shingles 2007  . Hyperlipemia   . Hypertension   . Hypothyroidism   . Multiple allergies   . Osteoporosis   . Paget's carcinoma of the nipple ([place]) 03/16/2013  . Pneumonia   . Seasonal allergies   . Stroke [facility]) 2010   no residual  . Thyroid disease   . Uterine cancer [facility])    Past Surgical History:  Procedure Laterality Date  . ABDOMINAL HYSTERECTOMY     . BREAST LUMPECTOMY     left breast  . CAROTID ENDARTERECTOMY Left June 10, 2008   CE  . EYE SURGERY     cataracts removed bilaterally  . TONSILLECTOMY      Allergies  Allergen Reactions  . Sulfa Antibiotics Shortness Of Breath and Palpitations  . Sulfamethoxazole Shortness Of Breath and Palpitations  . Tape Other (See Comments)    SKIN IS VERY FRAGILE- PLEASE USE AN ALTERNATIVE TO TAPE, AS THE SKIN TEARS EASILY!!  . Amoxicillin Rash  . Penicillin G Rash    Did it involve swelling of the face/tongue/throat, SOB, or low BP? Yes Did it involve sudden or severe rash/hives, skin peeling, or any reaction on the inside of your mouth or nose? Unk Did you need to seek medical attention at a hospital or doctor's office? Unk When did it last happen? "it was a long time ago" If all above answers are "NO", may proceed with cephalosporin use.   Marland Kitchen Penicillins Rash    Did it involve swelling of the face/tongue/throat, SOB, or low BP? Yes Did it involve sudden or severe rash/hives, skin peeling, or any reaction on the inside of your mouth or nose? Unk Did you need to seek medical attention at a hospital or doctor's office? Unk When did it last happen? "it was a long time ago" If all above answers are "NO", may proceed with cephalosporin use.  Outpatient Encounter Medications as of 04/03/2018  Medication Sig  . apixaban (ELIQUIS) 2.5 MG TABS tablet Take 1 tablet (2.5 mg total) by mouth 2 (two) times daily.  Marland Kitchen diltiazem (CARDIZEM CD) 120 MG 24 hr capsule Take 1 capsule (120 mg total) by mouth daily.  . fluticasone (FLONASE) 50 MCG/ACT nasal spray Place 2 sprays into the nose daily.  . Fluticasone-Salmeterol (ADVAIR) 250-50 MCG/DOSE AEPB Inhale 1 puff into the lungs every 12 (twelve) hours.  . furosemide (LASIX) 20 MG tablet Take 1 tablet (20 mg total) by mouth every other day.  Marland Kitchen guaiFENesin (MUCINEX) 600 MG 12 hr tablet Take 2 tablets (1,200 mg total) by mouth 2 (two) times daily for 14  days.  Marland Kitchen ipratropium-albuterol (DUONEB) 0.5-2.5 (3) MG/3ML SOLN Take 3 mLs by nebulization every 6 (six) hours as needed (WHEEZING).  Marland Kitchen levothyroxine (SYNTHROID, LEVOTHROID) 88 MCG tablet Take 1 tablet (88 mcg total) by mouth daily before breakfast.  . Multiple Vitamins-Minerals (CENTRUM SILVER PO) Take 1 tablet by mouth daily.  . Multiple Vitamins-Minerals (SYSTANE ICAPS AREDS2) CAPS Take 1 capsule by mouth daily.   . pantoprazole (PROTONIX) 40 MG tablet Take 40 mg by mouth daily.   Marland Kitchen tiotropium (SPIRIVA) 18 MCG inhalation capsule Place 18 mcg into inhaler and inhale daily.  . Vitamin D, Ergocalciferol, (DRISDOL) 50000 UNITS CAPS Take 50,000 Units by mouth every Sunday.    No facility-administered encounter medications on file as of 04/03/2018.     Review of Systems  Constitutional: Negative for activity change, appetite change, chills, diaphoresis, fatigue and fever.  HENT: Positive for hearing loss. Negative for congestion and voice change.   Respiratory: Positive for shortness of breath. Negative for cough and wheezing.        DOE  Cardiovascular: Positive for leg swelling. Negative for chest pain and palpitations.  Gastrointestinal: Negative for abdominal distention, abdominal pain, anal bleeding, blood in stool, constipation, diarrhea, nausea and vomiting.  Genitourinary: Negative for difficulty urinating, dysuria, hematuria and urgency.  Musculoskeletal: Positive for gait problem.  Skin: Negative for color change and pallor.  Neurological: Negative for dizziness, speech difficulty, weakness and headaches.  Psychiatric/Behavioral: Negative for agitation, behavioral problems, hallucinations and sleep disturbance. The patient is not nervous/anxious.     Immunization History  Administered Date(s) Administered  . Influenza-Unspecified 11/15/2013  . PPD Test 07/24/2011  . Zoster Recombinat (Shingrix) 12/10/2017   Pertinent  Health Maintenance Due  Topic Date Due  . PNA vac Low Risk  Adult (1 of 2 - PCV13) 10/02/1984  . INFLUENZA VACCINE  09/15/2017  . DEXA SCAN  Completed   Fall Risk  01/28/2014  Falls in the past year? Yes  Number falls in past yr: 1  Injury with Fall? No   Functional Status Survey:    Vitals:   04/03/18 1430  BP: (!) 121/51  Pulse: 61  Resp: 20  Temp: 97.9 F (36.6 C)  SpO2: 96%  Weight: 99 lb 11.2 oz (45.2 kg)  Height: 4\' 10"  (1.473 m)   Body mass index is 20.84 kg/m. Physical Exam Vitals signs and nursing note reviewed.  Constitutional:      General: She is not in acute distress.    Appearance: Normal appearance. She is not ill-appearing, toxic-appearing or diaphoretic.  HENT:     Head: Normocephalic.     Nose: Nose normal.     Mouth/Throat:     Mouth: Mucous membranes are moist.  Eyes:     Extraocular Movements: Extraocular movements intact.  Pupils: Pupils are equal, round, and reactive to light.  Neck:     Musculoskeletal: Normal range of motion and neck supple.  Cardiovascular:     Rate and Rhythm: Normal rate.     Heart sounds: No murmur.  Pulmonary:     Effort: Pulmonary effort is normal.     Breath sounds: No wheezing, rhonchi or rales.  Abdominal:     General: There is no distension.     Palpations: Abdomen is soft.     Tenderness: There is no abdominal tenderness. There is no guarding or rebound.  Musculoskeletal:     Right lower leg: Edema present.     Left lower leg: Edema present.     Comments: 1-2+ edema BLE. W/c for mobility.   Skin:    General: Skin is warm and dry.  Neurological:     Mental Status: She is alert. Mental status is at baseline.     Cranial Nerves: No cranial nerve deficit.     Motor: No weakness.     Coordination: Coordination normal.     Gait: Gait abnormal.     Comments: Oriented to person and place.   Psychiatric:        Mood and Affect: Mood normal.        Behavior: Behavior normal.        Thought Content: Thought content normal.        Judgment: Judgment normal.      Labs reviewed: Recent Labs    03/17/18 1604 03/17/18 1753 03/18/18 0505 03/19/18 0436 03/24/18  NA 134*  --  136 134* 137  K 3.7  --  3.0* 4.1 4.8  CL 99  --  99 100 103  CO2 23  --  25 24 27   GLUCOSE 98  --  99 117*  --   BUN 19  --  18 19 26*  CREATININE 1.04*  --  1.08* 0.86 1.0  CALCIUM 9.0  --  8.4* 8.1* 8.9  MG  --  1.7  --   --   --    Recent Labs    03/17/18 1604  AST 21  ALT 11  ALKPHOS 56  BILITOT 1.0  PROT 6.4*  ALBUMIN 3.1*   Recent Labs    03/17/18 1604 03/18/18 0505 03/19/18 0436 03/24/18  WBC 17.1* 13.2* 12.0* 8.6  NEUTROABS 12.8*  --   --   --   HGB 11.3* 9.9* 9.0* 9.5*  HCT 34.5* 28.9* 27.7* 28*  MCV 93.8 93.5 93.6  --   PLT 253 223 205 439*   Lab Results  Component Value Date   TSH 0.983 03/18/2018   No results found for: HGBA1C No results found for: CHOL, HDL, LDLCALC, LDLDIRECT, TRIG, CHOLHDL  Significant Diagnostic Results in last 30 days:  Dg Chest 2 View  Result Date: 03/17/2018 CLINICAL DATA:  Irregular heartbeat and fatigue for the past 2 days. EXAM: CHEST - 2 VIEW COMPARISON:  Chest x-ray dated December 20, 2016. FINDINGS: The patient is rotated to the right, limiting evaluation. Stable cardiomediastinal silhouette. Normal pulmonary vascularity. Slightly increased patchy opacity in the right upper lobe. New trace bilateral pleural effusions. No pneumothorax. No acute osseous abnormality. IMPRESSION: 1. Slightly increased patchy opacity in the right upper lobe may be related to rotation, but could reflect pneumonia. 2. New trace bilateral pleural effusions. Electronically Signed   By: Titus Dubin M.D.   On: 03/17/2018 17:24    Assessment/Plan Chronic anemia 03/24/18 wbc 8.6, Hgb 9.5,  plt 439, Na 137, K 4.8, Bun 26, creat 1.03 03/30/18 wbc 6.9, Hgb 9.3, Plt 446  CBC, Fe, Fe Sat, TIBC, Ferritin, Retic count, Vit B12, Foalte, FOBT x3  New onset atrial fibrillation (HCC) Heart rate is in control, continue Diltiazem 120mg  qd.  Eliquis 2.5mg  bid.   Edema Apparent in BLE, will obtain weight ac breakfast for 3-4 days, then re-eval. 03/18/18 echocardiogram: EF 02-72%, grade 2 diastolic dysfunction. Observe.      Family/ staff Communication: plan of care reviewed with the patient and charge nurse.   Labs/tests ordered:  CBC, Fe, Fe Sat, TIBC, Ferritin, Retic count, Vit B12, Folate,  FOBT x3  Time spend 25 minutes.

## 2018-04-03 NOTE — Assessment & Plan Note (Signed)
Apparent in BLE, will obtain weight ac breakfast for 3-4 days, then re-eval. 03/18/18 echocardiogram: EF 12-82%, grade 2 diastolic dysfunction. Observe.

## 2018-04-03 NOTE — Assessment & Plan Note (Signed)
Heart rate is in control, continue Diltiazem 120mg  qd. Eliquis 2.5mg  bid.

## 2018-04-03 NOTE — Assessment & Plan Note (Addendum)
03/24/18 wbc 8.6, Hgb 9.5, plt 439, Na 137, K 4.8, Bun 26, creat 1.03 03/30/18 wbc 6.9, Hgb 9.3, Plt 446  CBC, Fe, Fe Sat, TIBC, Ferritin, Retic count, Vit B12, Foalte, FOBT x3

## 2018-04-04 DIAGNOSIS — Z961 Presence of intraocular lens: Secondary | ICD-10-CM | POA: Diagnosis not present

## 2018-04-04 DIAGNOSIS — H353131 Nonexudative age-related macular degeneration, bilateral, early dry stage: Secondary | ICD-10-CM | POA: Diagnosis not present

## 2018-04-06 LAB — CBC AND DIFFERENTIAL
HCT: 33 — AB (ref 36–46)
Hemoglobin: 10.6 — AB (ref 12.0–16.0)
Platelets: 392 (ref 150–399)
WBC: 10

## 2018-04-06 LAB — IRON,TIBC AND FERRITIN PANEL
%SAT: 32
Iron: 78
TIBC: 244

## 2018-04-07 LAB — VITAMIN B12: Vitamin B-12: 856

## 2018-04-12 ENCOUNTER — Encounter: Payer: Self-pay | Admitting: Internal Medicine

## 2018-04-12 ENCOUNTER — Non-Acute Institutional Stay (SKILLED_NURSING_FACILITY): Payer: Medicare Other | Admitting: Internal Medicine

## 2018-04-12 DIAGNOSIS — N183 Chronic kidney disease, stage 3 unspecified: Secondary | ICD-10-CM

## 2018-04-12 DIAGNOSIS — I5033 Acute on chronic diastolic (congestive) heart failure: Secondary | ICD-10-CM

## 2018-04-12 DIAGNOSIS — I4891 Unspecified atrial fibrillation: Secondary | ICD-10-CM

## 2018-04-12 DIAGNOSIS — I10 Essential (primary) hypertension: Secondary | ICD-10-CM

## 2018-04-12 NOTE — Progress Notes (Signed)
Location:  [place] Room Number: 89 Place of Service:  SNF [phone]) Provider: [name], MD  [name], MD  Patient Care Team: [name], MD as PCP - General (Internal Medicine) Nicholas Lose, MD as Consulting Physician (Hematology and Oncology)  Extended Emergency Contact Information Primary Emergency Contact: Little Ishikawa Mobile Phone: [phone] Relation: Niece  Code Status:  DNR Goals of care: Advanced Directive information Advanced Directives 04/12/2018  Does Patient Have a Medical Advance Directive? Yes  Type of Advance Directive Out of facility DNR (pink MOST or yellow form);Healthcare Power of Attorney  Does patient want to make changes to medical advance directive? No - Patient declined  Copy of [place] in Chart? No - copy requested  Pre-existing out of facility DNR order (yellow form or pink MOST form) Pink MOST form placed in chart (order not valid for inpatient use);Yellow form placed in chart (order not valid for inpatient use)     Chief Complaint  Patient presents with  . Acute Visit    WEIGHT GAIN     HPI:  Pt is a 85 y.o. female seen today for an acute visit for Weight gain and SOB Patient is in SNF for therapy. Patient was in the Hospital from 01/31-02/04 for Community Acquired Pneumonia,  New Onset A.Fib and CHF.  Patient has h/o H/o TIA in 2010 s/p CEA in 2010, COPD on oxygen at night, Hypertension,GERD,Hypothyroidism, Gastritis and Paget's disease of Nipple.  Patient was recently in the hospital and was diagnosed with New onset Atrial Fibrillation, Pneumonia and CHF For past few days she has been having increased SOB and swelling in her legs. She also c/o Cough mostly Dry. No Chest pain or Fever. Feels so tired that she is not working with therapy.  Initially she was doing well and was walking with a walker. Her weight is up now to 105 lbs from 99 lbs on admission    Past Medical History:    Diagnosis Date  . Arthritis   . Constipation   . COPD (chronic obstructive pulmonary disease) ([place])   . Fatigue   . Frequent UTI   . GERD (gastroesophageal reflux disease)   . History of shingles 2007  . Hyperlipemia   . Hypertension   . Hypothyroidism   . Multiple allergies   . Osteoporosis   . Paget's carcinoma of the nipple ([place]) 03/16/2013  . Pneumonia   . Seasonal allergies   . Stroke [facility]) 2010   no residual  . Thyroid disease   . Uterine cancer [facility])    Past Surgical History:  Procedure Laterality Date  . ABDOMINAL HYSTERECTOMY    . BREAST LUMPECTOMY     left breast  . CAROTID ENDARTERECTOMY Left June 10, 2008   CE  . EYE SURGERY     cataracts removed bilaterally  . TONSILLECTOMY      Allergies  Allergen Reactions  . Sulfa Antibiotics Shortness Of Breath and Palpitations  . Sulfamethoxazole Shortness Of Breath and Palpitations  . Tape Other (See Comments)    SKIN IS VERY FRAGILE- PLEASE USE AN ALTERNATIVE TO TAPE, AS THE SKIN TEARS EASILY!!  . Amoxicillin Rash  . Penicillin G Rash    Did it involve swelling of the face/tongue/throat, SOB, or low BP? Yes Did it involve sudden or severe rash/hives, skin peeling, or any reaction on the inside of your mouth or nose? Unk Did you need to seek medical attention at a hospital or doctor's  office? Unk When did it last happen? "it was a long time ago" If all above answers are "NO", may proceed with cephalosporin use.   Marland Kitchen Penicillins Rash    Did it involve swelling of the face/tongue/throat, SOB, or low BP? Yes Did it involve sudden or severe rash/hives, skin peeling, or any reaction on the inside of your mouth or nose? Unk Did you need to seek medical attention at a hospital or doctor's office? Unk When did it last happen? "it was a long time ago" If all above answers are "NO", may proceed with cephalosporin use.     Outpatient Encounter Medications as of 04/12/2018  Medication Sig  . apixaban (ELIQUIS) 2.5 MG  TABS tablet Take 1 tablet (2.5 mg total) by mouth 2 (two) times daily.  Marland Kitchen diltiazem (CARDIZEM CD) 120 MG 24 hr capsule Take 1 capsule (120 mg total) by mouth daily.  . fluticasone (FLONASE) 50 MCG/ACT nasal spray Place 2 sprays into the nose daily.  . Fluticasone-Salmeterol (ADVAIR) 250-50 MCG/DOSE AEPB Inhale 1 puff into the lungs every 12 (twelve) hours.  . furosemide (LASIX) 20 MG tablet Take 1 tablet (20 mg total) by mouth every other day.  . ipratropium-albuterol (DUONEB) 0.5-2.5 (3) MG/3ML SOLN Take 3 mLs by nebulization every 6 (six) hours as needed (WHEEZING).  Marland Kitchen levothyroxine (SYNTHROID, LEVOTHROID) 88 MCG tablet Take 1 tablet (88 mcg total) by mouth daily before breakfast.  . Multiple Vitamins-Minerals (CENTRUM SILVER PO) Take 1 tablet by mouth daily.  . Multiple Vitamins-Minerals (SYSTANE ICAPS AREDS2) CAPS Take 1 capsule by mouth daily.   . pantoprazole (PROTONIX) 40 MG tablet Take 40 mg by mouth daily.   Marland Kitchen tiotropium (SPIRIVA) 18 MCG inhalation capsule Place 18 mcg into inhaler and inhale daily.  . Vitamin D, Ergocalciferol, (DRISDOL) 50000 UNITS CAPS Take 50,000 Units by mouth every Sunday.   . zinc oxide 20 % ointment Apply 1 application topically as needed for irritation. Apply to buttocks after every incontinent episode and as needed for redness   No facility-administered encounter medications on file as of 04/12/2018.     Review of Systems  Constitutional: Positive for activity change and unexpected weight change.  HENT: Negative.   Respiratory: Positive for cough and shortness of breath.   Cardiovascular: Positive for leg swelling.  Gastrointestinal: Negative.   Genitourinary: Negative.   Musculoskeletal: Negative.   Skin: Negative.   Neurological: Positive for weakness.  Psychiatric/Behavioral: Negative.   All other systems reviewed and are negative.   Immunization History  Administered Date(s) Administered  . Influenza-Unspecified 11/15/2013  . PPD Test  07/24/2011  . Zoster Recombinat (Shingrix) 12/10/2017   Pertinent  Health Maintenance Due  Topic Date Due  . PNA vac Low Risk Adult (1 of 2 - PCV13) 10/02/1984  . INFLUENZA VACCINE  09/15/2017  . DEXA SCAN  Completed   Fall Risk  01/28/2014  Falls in the past year? Yes  Number falls in past yr: 1  Injury with Fall? No   Functional Status Survey:    Vitals:   04/12/18 1001  BP: (!) 150/76  Pulse: 100  Resp: 18  Temp: 97.8 F (36.6 C)  SpO2: 94%  Weight: 105 lb 1.6 oz (47.7 kg)   Body mass index is 21.97 kg/m. Physical Exam Vitals signs reviewed.  Constitutional:      Appearance: Normal appearance.  HENT:     Head: Normocephalic.     Nose: Nose normal.     Mouth/Throat:     Mouth: Mucous  membranes are moist.     Pharynx: Oropharynx is clear.  Eyes:     Pupils: Pupils are equal, round, and reactive to light.  Neck:     Musculoskeletal: Neck supple.  Cardiovascular:     Rate and Rhythm: Normal rate and regular rhythm.     Pulses: Normal pulses.     Heart sounds: Normal heart sounds. No murmur.  Pulmonary:     Effort: Pulmonary effort is normal. No respiratory distress.     Breath sounds: No wheezing or rales.     Comments: Decreased Breadth Sounds Bilateral Abdominal:     General: Abdomen is flat. Bowel sounds are normal. There is no distension.     Palpations: Abdomen is soft.     Tenderness: There is no abdominal tenderness. There is no guarding.  Musculoskeletal:     Comments: Has moderate Swelling Bilateral  Skin:    General: Skin is warm and dry.  Neurological:     General: No focal deficit present.     Mental Status: She is alert and oriented to person, place, and time.  Psychiatric:        Mood and Affect: Mood normal.        Thought Content: Thought content normal.        Judgment: Judgment normal.     Labs reviewed: Recent Labs    03/17/18 1604 03/17/18 1753 03/18/18 0505 03/19/18 0436 03/24/18  NA 134*  --  136 134* 137  K 3.7  --   3.0* 4.1 4.8  CL 99  --  99 100 103  CO2 23  --  25 24 27   GLUCOSE 98  --  99 117*  --   BUN 19  --  18 19 26*  CREATININE 1.04*  --  1.08* 0.86 1.0  CALCIUM 9.0  --  8.4* 8.1* 8.9  MG  --  1.7  --   --   --    Recent Labs    03/17/18 1604  AST 21  ALT 11  ALKPHOS 56  BILITOT 1.0  PROT 6.4*  ALBUMIN 3.1*   Recent Labs    03/17/18 1604 03/18/18 0505 03/19/18 0436 03/24/18 03/30/18 04/06/18  WBC 17.1* 13.2* 12.0* 8.6 6.9 10.0  NEUTROABS 12.8*  --   --   --   --   --   HGB 11.3* 9.9* 9.0* 9.5* 9.3* 10.6*  HCT 34.5* 28.9* 27.7* 28* 27* 33*  MCV 93.8 93.5 93.6  --   --   --   PLT 253 223 205 439* 446* 392   Lab Results  Component Value Date   TSH 0.983 03/18/2018   No results found for: HGBA1C No results found for: CHOL, HDL, LDLCALC, LDLDIRECT, TRIG, CHOLHDL  Significant Diagnostic Results in last 30 days:  Dg Chest 2 View  Result Date: 03/17/2018 CLINICAL DATA:  Irregular heartbeat and fatigue for the past 2 days. EXAM: CHEST - 2 VIEW COMPARISON:  Chest x-ray dated December 20, 2016. FINDINGS: The patient is rotated to the right, limiting evaluation. Stable cardiomediastinal silhouette. Normal pulmonary vascularity. Slightly increased patchy opacity in the right upper lobe. New trace bilateral pleural effusions. No pneumothorax. No acute osseous abnormality. IMPRESSION: 1. Slightly increased patchy opacity in the right upper lobe may be related to rotation, but could reflect pneumonia. 2. New trace bilateral pleural effusions. Electronically Signed   By: Titus Dubin M.D.   On: 03/17/2018 17:24    Assessment/Plan Diastolic CHF EF was Normal in 03/18/18  Increase lasix to 20 mg QD Extra dose of 20 mg today Repeat BMP in 1 week to follow potassium Continue  weights Chest Xray Stat today Addendum Chest Xray showed Bilateral infiltrate with Pleural Effusion Repeat Chest Xray in 4 weeks  Pneumonia of RU lobe Resolved in Repeat Xray Finished her Antibiotics This  infiltrate is more CHF Clinically  New Onset Atrial Fibrilation Normal Sinus on Cardizem and is on Eliquis COPD On Oxygen right now . On Advair Oxygen 2l Hypertension Mildly elevated today Increasing lasix will help Hypothyroidism Normal in hospital  Same dose of Levothyroxine CKD (chronic kidney disease) Creat stable Repeat BMP to follow creat Paget's disease and intraductal carcinoma of left breast  Follows With Oncology Chronic anemia Will Follow CBC Deconditioning Started Therapy Planning to go back to her Apartment  Family/ staff Communication:   Labs/tests ordered:  BMP And Chest Xray Total time spent in this patient care encounter was 45_ minutes; greater than 50% of the visit spent counseling patient, reviewing records , Labs and coordinating care for problems addressed at this encounter.

## 2018-04-17 ENCOUNTER — Encounter: Payer: Self-pay | Admitting: Family

## 2018-04-17 ENCOUNTER — Non-Acute Institutional Stay (SKILLED_NURSING_FACILITY): Payer: Medicare Other | Admitting: Family

## 2018-04-17 DIAGNOSIS — I10 Essential (primary) hypertension: Secondary | ICD-10-CM

## 2018-04-17 DIAGNOSIS — I5032 Chronic diastolic (congestive) heart failure: Secondary | ICD-10-CM

## 2018-04-17 DIAGNOSIS — J42 Unspecified chronic bronchitis: Secondary | ICD-10-CM | POA: Diagnosis not present

## 2018-04-17 DIAGNOSIS — I4891 Unspecified atrial fibrillation: Secondary | ICD-10-CM | POA: Diagnosis not present

## 2018-04-17 DIAGNOSIS — K219 Gastro-esophageal reflux disease without esophagitis: Secondary | ICD-10-CM | POA: Diagnosis not present

## 2018-04-17 DIAGNOSIS — E039 Hypothyroidism, unspecified: Secondary | ICD-10-CM | POA: Diagnosis not present

## 2018-04-17 NOTE — Progress Notes (Signed)
Location:  [place] Room Number: [place] of Service:  SNF [phone]) Provider: Shaquill Iseman FNP-C   [name], MD  Patient Care Team: [name], MD as PCP - General (Internal Medicine) Nicholas Lose, MD as Consulting Physician (Hematology and Oncology)  Extended Emergency Contact Information Primary Emergency Contact: Little Ishikawa Mobile Phone: [phone] Relation: Niece  Code Status:  DNR Goals of care: Advanced Directive information Advanced Directives 04/17/2018  Does Patient Have a Medical Advance Directive? Yes  Type of Advance Directive Out of facility DNR (pink MOST or yellow form);Healthcare Power of Attorney  Does patient want to make changes to medical advance directive? No - Patient declined  Copy of [place] in Chart? No - copy requested  Pre-existing out of facility DNR order (yellow form or pink MOST form) Yellow form placed in chart (order not valid for inpatient use);Pink MOST form placed in chart (order not valid for inpatient use)     Chief Complaint  Patient presents with  . Medical Management of Chronic Issues    Routine Visit    HPI:  Pt is a 85 y.o. female seen today [place] for medical management of chronic diseases.she has a medical history of HTN,CHF,COPD,GERD,Hypothyroidism among other conditions.she is seen in her room today with daughter at bedside.she states has shortness of breath with exertion but not at rest.Her Furosemide recently changed from as needed to scheduled daily due to shortness of breath.she continues to wear nasal cannula oxygen via nasal cannula.Nurse reports oxygen saturation in the upper 90's. No abrupt weight gain.    Past Medical History:  Diagnosis Date  . Arthritis   . Constipation   . COPD (chronic obstructive pulmonary disease) ([place])   . Fatigue   . Frequent UTI   . GERD (gastroesophageal reflux disease)   . History of shingles 2007  . Hyperlipemia   .  Hypertension   . Hypothyroidism   . Multiple allergies   . Osteoporosis   . Paget's carcinoma of the nipple ([place]) 03/16/2013  . Pneumonia   . Seasonal allergies   . Stroke [facility]) 2010   no residual  . Thyroid disease   . Uterine cancer [facility])    Past Surgical History:  Procedure Laterality Date  . ABDOMINAL HYSTERECTOMY    . BREAST LUMPECTOMY     left breast  . CAROTID ENDARTERECTOMY Left June 10, 2008   CE  . EYE SURGERY     cataracts removed bilaterally  . TONSILLECTOMY      Allergies  Allergen Reactions  . Sulfa Antibiotics Shortness Of Breath and Palpitations  . Sulfamethoxazole Shortness Of Breath and Palpitations  . Tape Other (See Comments)    SKIN IS VERY FRAGILE- PLEASE USE AN ALTERNATIVE TO TAPE, AS THE SKIN TEARS EASILY!!  . Amoxicillin Rash  . Penicillin G Rash    Did it involve swelling of the face/tongue/throat, SOB, or low BP? Yes Did it involve sudden or severe rash/hives, skin peeling, or any reaction on the inside of your mouth or nose? Unk Did you need to seek medical attention at a hospital or doctor's office? Unk When did it last happen? "it was a long time ago" If all above answers are "NO", may proceed with cephalosporin use.   Marland Kitchen Penicillins Rash    Did it involve swelling of the face/tongue/throat, SOB, or low BP? Yes Did it involve sudden or severe rash/hives, skin peeling, or any reaction on the inside of  your mouth or nose? Unk Did you need to seek medical attention at a hospital or doctor's office? Unk When did it last happen? "it was a long time ago" If all above answers are "NO", may proceed with cephalosporin use.     Allergies as of 04/17/2018      Reactions   Sulfa Antibiotics Shortness Of Breath, Palpitations   Sulfamethoxazole Shortness Of Breath, Palpitations   Tape Other (See Comments)   SKIN IS VERY FRAGILE- PLEASE USE AN ALTERNATIVE TO TAPE, AS THE SKIN TEARS EASILY!!   Amoxicillin Rash   Penicillin G Rash   Did it involve  swelling of the face/tongue/throat, SOB, or low BP? Yes Did it involve sudden or severe rash/hives, skin peeling, or any reaction on the inside of your mouth or nose? Unk Did you need to seek medical attention at a hospital or doctor's office? Unk When did it last happen? "it was a long time ago" If all above answers are "NO", may proceed with cephalosporin use.   Penicillins Rash   Did it involve swelling of the face/tongue/throat, SOB, or low BP? Yes Did it involve sudden or severe rash/hives, skin peeling, or any reaction on the inside of your mouth or nose? Unk Did you need to seek medical attention at a hospital or doctor's office? Unk When did it last happen? "it was a long time ago" If all above answers are "NO", may proceed with cephalosporin use.      Medication List       Accurate as of April 17, 2018  4:06 PM. Always use your most recent med list.        apixaban 2.5 MG Tabs tablet Commonly known as:  ELIQUIS Take 1 tablet (2.5 mg total) by mouth 2 (two) times daily.   SYSTANE ICAPS AREDS2 Caps Take 1 capsule by mouth daily.   CENTRUM SILVER PO Take 1 tablet by mouth daily.   diltiazem 120 MG 24 hr capsule Commonly known as:  CARDIZEM CD Take 1 capsule (120 mg total) by mouth daily.   fluticasone 50 MCG/ACT nasal spray Commonly known as:  FLONASE Place 2 sprays into the nose daily.   Fluticasone-Salmeterol 250-50 MCG/DOSE Aepb Commonly known as:  ADVAIR Inhale 1 puff into the lungs every 12 (twelve) hours.   furosemide 20 MG tablet Commonly known as:  LASIX Take 1 tablet (20 mg total) by mouth every other day.   ipratropium-albuterol 0.5-2.5 (3) MG/3ML Soln Commonly known as:  DUONEB Take 3 mLs by nebulization every 6 (six) hours as needed (WHEEZING).   levothyroxine 88 MCG tablet Commonly known as:  SYNTHROID, LEVOTHROID Take 1 tablet (88 mcg total) by mouth daily before breakfast.   pantoprazole 40 MG tablet Commonly known as:  PROTONIX Take 40 mg  by mouth daily.   tiotropium 18 MCG inhalation capsule Commonly known as:  SPIRIVA Place 18 mcg into inhaler and inhale daily.   Vitamin D (Ergocalciferol) 1.25 MG (50000 UT) Caps capsule Commonly known as:  DRISDOL Take 50,000 Units by mouth every Sunday.   zinc oxide 20 % ointment Apply 1 application topically as needed for irritation. Apply to buttocks after every incontinent episode and as needed for redness       Review of Systems  Constitutional: Negative for appetite change, chills, fatigue, fever and unexpected weight change.  HENT: Negative for congestion, rhinorrhea, sinus pressure, sinus pain, sneezing and sore throat.   Eyes: Negative for discharge, redness and itching.  Respiratory: Negative for cough,  chest tightness, shortness of breath and wheezing.   Cardiovascular: Positive for leg swelling. Negative for chest pain and palpitations.  Gastrointestinal: Negative for abdominal distention, abdominal pain, constipation, diarrhea, nausea and vomiting.  Endocrine: Negative for cold intolerance, heat intolerance, polydipsia, polyphagia and polyuria.  Genitourinary: Negative for dysuria, flank pain and urgency.  Musculoskeletal: Positive for gait problem.  Skin: Negative for color change, pallor, rash and wound.  Neurological: Negative for dizziness, light-headedness and headaches.  Hematological: Does not bruise/bleed easily.  Psychiatric/Behavioral: Negative for agitation, confusion and sleep disturbance. The patient is not nervous/anxious.     Immunization History  Administered Date(s) Administered  . Influenza-Unspecified 11/15/2013  . PPD Test 07/24/2011  . Zoster Recombinat (Shingrix) 12/10/2017   Pertinent  Health Maintenance Due  Topic Date Due  . PNA vac Low Risk Adult (1 of 2 - PCV13) 10/02/1984  . INFLUENZA VACCINE  09/15/2017  . DEXA SCAN  Completed   Fall Risk  01/28/2014  Falls in the past year? Yes  Number falls in past yr: 1  Injury with Fall?  No   Functional Status Survey:    Vitals:   04/17/18 1446  BP: 125/70  Pulse: 89  Resp: 20  Temp: 98.2 F (36.8 C)  TempSrc: Oral  SpO2: 95%  Weight: 103 lb (46.7 kg)  Height: 4\' 10"  (1.473 m)   Body mass index is 21.53 kg/m. Physical Exam Vitals signs and nursing note reviewed.  Constitutional:      General: She is not in acute distress.    Appearance: She is normal weight.  HENT:     Head: Normocephalic.     Left Ear: Tympanic membrane, ear canal and external ear normal. There is no impacted cerumen.     Ears:     Comments: Right ear partial cerumen noted     Nose: Nose normal. No congestion or rhinorrhea.     Mouth/Throat:     Mouth: Mucous membranes are moist.     Pharynx: Oropharynx is clear. No oropharyngeal exudate or posterior oropharyngeal erythema.  Neck:     Musculoskeletal: Normal range of motion. No neck rigidity or muscular tenderness.  Cardiovascular:     Rate and Rhythm: Normal rate and regular rhythm.     Pulses: Normal pulses.     Heart sounds: Normal heart sounds. No murmur. No friction rub. No gallop.   Pulmonary:     Effort: Pulmonary effort is normal. No respiratory distress.     Breath sounds: No wheezing, rhonchi or rales.     Comments: Bilateral lung sounds diminished.oxygen 2 Liters via nasal cannula. Chest:     Chest wall: No tenderness.  Abdominal:     General: Bowel sounds are normal. There is no distension.     Palpations: Abdomen is soft. There is no mass.     Tenderness: There is no abdominal tenderness. There is no right CVA tenderness, left CVA tenderness, guarding or rebound.  Musculoskeletal:        General: No tenderness.     Comments: Unsteady gait self propels on wheelchair.ambulates with walker with assistance.bilateral lower extremities 1-2+ edema   Lymphadenopathy:     Cervical: No cervical adenopathy.  Skin:    General: Skin is warm and dry.     Coloration: Skin is not pale.     Findings: No erythema, lesion or rash.    Neurological:     Mental Status: She is alert.     Sensory: No sensory deficit.  Coordination: Coordination normal.     Gait: Gait abnormal.     Comments: Alert and oriented to person and place  Psychiatric:        Mood and Affect: Mood normal.        Behavior: Behavior normal.        Thought Content: Thought content normal.        Judgment: Judgment normal.     Labs reviewed: Recent Labs    03/17/18 1604 03/17/18 1753 03/18/18 0505 03/19/18 0436 03/24/18  NA 134*  --  136 134* 137  K 3.7  --  3.0* 4.1 4.8  CL 99  --  99 100 103  CO2 23  --  25 24 27   GLUCOSE 98  --  99 117*  --   BUN 19  --  18 19 26*  CREATININE 1.04*  --  1.08* 0.86 1.0  CALCIUM 9.0  --  8.4* 8.1* 8.9  MG  --  1.7  --   --   --    Recent Labs    03/17/18 1604  AST 21  ALT 11  ALKPHOS 56  BILITOT 1.0  PROT 6.4*  ALBUMIN 3.1*   Recent Labs    03/17/18 1604 03/18/18 0505 03/19/18 0436 03/24/18 03/30/18 04/06/18  WBC 17.1* 13.2* 12.0* 8.6 6.9 10.0  NEUTROABS 12.8*  --   --   --   --   --   HGB 11.3* 9.9* 9.0* 9.5* 9.3* 10.6*  HCT 34.5* 28.9* 27.7* 28* 27* 33*  MCV 93.8 93.5 93.6  --   --   --   PLT 253 223 205 439* 446* 392   Lab Results  Component Value Date   TSH 0.983 03/18/2018   No results found for: HGBA1C No results found for: CHOL, HDL, LDLCALC, LDLDIRECT, TRIG, CHOLHDL  Significant Diagnostic Results in last 30 days:  No results found.  Assessment/Plan 1. Essential hypertension B/p stable.continue current meds.  2. Chronic diastolic congestive heart failure ([place]) No abrupt weight gain.Has bilateral lower extremities edema.Diminished breath sounds.continue on Furosemide 20 mg tablet daily recently scheduled by MD.Apply knee high ted hose on in the morning and off at bedtime for leg edema.continue to monitor weight.Repeat CXR per MD orders.   3. Chronic bronchitis, unspecified chronic bronchitis type (HCC) Breathing stable.continue on oxygen 2 liters via nasal  cannula.continue on Duonebs and Advair.   4. Hypothyroidism, unspecified type Lab Results  Component Value Date   TSH 0.983 03/18/2018  Continue on levothyroxine 88 mcg tablet daily.  5. Gastroesophageal reflux disease without esophagitis Stable on Protonix 40 mg tablet daily.  6. Afib Newly diagnosed during recent hospital admission.HR controlled on diltiazem 120 mg 24 HR capsule and apixaban 2.5 mg tablet twice daily.continue to monitor.   Family/ staff Communication: Reviewed plan of care with patient and facility Nurse.  Labs/tests ordered: None   Vola Beneke C Charnese Federici, NP

## 2018-04-18 ENCOUNTER — Encounter: Payer: Self-pay | Admitting: Podiatry

## 2018-04-18 ENCOUNTER — Ambulatory Visit (INDEPENDENT_AMBULATORY_CARE_PROVIDER_SITE_OTHER): Payer: Medicare Other | Admitting: Podiatry

## 2018-04-18 DIAGNOSIS — B351 Tinea unguium: Secondary | ICD-10-CM | POA: Diagnosis not present

## 2018-04-18 DIAGNOSIS — M79676 Pain in unspecified toe(s): Secondary | ICD-10-CM

## 2018-04-18 NOTE — Progress Notes (Signed)
Complaint:  Visit Type: Patient returns to my office for continued preventative foot care services. Complaint: Patient states" my nails have grown long and thick and become painful to walk and wear shoes".  Patient presents to the office for nail care with a caregiver.  She has been sick with pneumonia and is now in a wheelchair.  The patient presents for preventative foot care services. No changes to ROS  Podiatric Exam: Vascular: dorsalis pedis and posterior tibial pulses are palpable bilateral. Capillary return is immediate. Temperature gradient is WNL. Skin turgor WNL  Sensorium: Normal Semmes Weinstein monofilament test. Normal tactile sensation bilaterally. Nail Exam: Pt has thick disfigured discolored nails with subungual debris noted bilateral entire nail hallux through fifth toenails Ulcer Exam: There is no evidence of ulcer or pre-ulcerative changes or infection. Orthopedic Exam: Muscle tone and strength are WNL. No limitations in general ROM. No crepitus or effusions noted. Foot type and digits show no abnormalities. HAV 1st MPJ  B/L with hammer toes. Skin: No Porokeratosis. No infection or ulcers.  Forefoot porokeratosis    Diagnosis:  Onychomycosis, , Pain in right toe, pain in left toes  Treatment & Plan Procedures and Treatment: Consent by patient was obtained for treatment procedures.   Debridement of mycotic and hypertrophic toenails, 1 through 5 bilateral and clearing of subungual debris. No ulceration, no infection noted.  Return Visit-Office Procedure: Patient instructed to return to the office for a follow up visit 3 months for continued evaluation and treatment.    Gardiner Barefoot DPM

## 2018-04-20 ENCOUNTER — Encounter: Payer: Self-pay | Admitting: Family

## 2018-04-20 ENCOUNTER — Non-Acute Institutional Stay (SKILLED_NURSING_FACILITY): Payer: Medicare Other | Admitting: Family

## 2018-04-20 DIAGNOSIS — R609 Edema, unspecified: Secondary | ICD-10-CM

## 2018-04-20 DIAGNOSIS — I4891 Unspecified atrial fibrillation: Secondary | ICD-10-CM | POA: Diagnosis not present

## 2018-04-20 NOTE — Progress Notes (Signed)
Location:  [place] Room Number: 71 Place of Service:  SNF [phone]) Provider: Dinah Ngetich FNP-C  [name], MD  Patient Care Team: [name], MD as PCP - General (Internal Medicine) Nicholas Lose, MD as Consulting Physician (Hematology and Oncology)  Extended Emergency Contact Information Primary Emergency Contact: Little Ishikawa Mobile Phone: [phone] Relation: Niece  Code Status:  DNR Goals of care: Advanced Directive information Advanced Directives 04/20/2018  Does Patient Have a Medical Advance Directive? Yes  Type of Advance Directive Out of facility DNR (pink MOST or yellow form);[place];Living will  Does patient want to make changes to medical advance directive? No - Patient declined  Copy of [place] in Chart? Yes - validated most recent copy scanned in chart (See row information)  Pre-existing out of facility DNR order (yellow form or pink MOST form) Yellow form placed in chart (order not valid for inpatient use);Pink MOST form placed in chart (order not valid for inpatient use)     Chief Complaint  Patient presents with  . Acute Visit    Worsening Heart Beat    HPI:  Pt is a 85 y.o. female seen today at [facility] for an acute visit for evaluation of worsening irregular heart rate.she is seen in her room today with [place] present at bedside.she complains of worsening irregular heart rate worse when working with therapy.POA concerned that HR irregularity is preventing her from participating with therapy.she denies any shortness of breath or chest pain.she is currently on apixaban 2.5 mg tablet twice daily and diltiazem 120 mg 24 Hr capsule daily.of note she is status post hospital admission from 03/17/2018 - 03/21/2018 with shortness of breath.she was treated for CHF,community acquired Pneumonia and new onset AFib.she was discharge to skilled Nursing for therapy.      Past Medical History:    Diagnosis Date  . Arthritis   . Constipation   . COPD (chronic obstructive pulmonary disease) ([place])   . Fatigue   . Frequent UTI   . GERD (gastroesophageal reflux disease)   . History of shingles 2007  . Hyperlipemia   . Hypertension   . Hypothyroidism   . Multiple allergies   . Osteoporosis   . Paget's carcinoma of the nipple ([place]) 03/16/2013  . Pneumonia   . Seasonal allergies   . Stroke [facility]) 2010   no residual  . Thyroid disease   . Uterine cancer [facility])    Past Surgical History:  Procedure Laterality Date  . ABDOMINAL HYSTERECTOMY    . BREAST LUMPECTOMY     left breast  . CAROTID ENDARTERECTOMY Left June 10, 2008   CE  . EYE SURGERY     cataracts removed bilaterally  . TONSILLECTOMY      Allergies  Allergen Reactions  . Sulfa Antibiotics Shortness Of Breath and Palpitations  . Sulfamethoxazole Shortness Of Breath and Palpitations  . Tape Other (See Comments)    SKIN IS VERY FRAGILE- PLEASE USE AN ALTERNATIVE TO TAPE, AS THE SKIN TEARS EASILY!!  . Amoxicillin Rash  . Penicillin G Rash    Did it involve swelling of the face/tongue/throat, SOB, or low BP? Yes Did it involve sudden or severe rash/hives, skin peeling, or any reaction on the inside of your mouth or nose? Unk Did you need to seek medical attention at a hospital or doctor's office? Unk When did it last happen? "it was a long time ago" If all above answers are "NO",  may proceed with cephalosporin use.   Marland Kitchen Penicillins Rash    Did it involve swelling of the face/tongue/throat, SOB, or low BP? Yes Did it involve sudden or severe rash/hives, skin peeling, or any reaction on the inside of your mouth or nose? Unk Did you need to seek medical attention at a hospital or doctor's office? Unk When did it last happen? "it was a long time ago" If all above answers are "NO", may proceed with cephalosporin use.     Outpatient Encounter Medications as of 04/20/2018  Medication Sig  . apixaban (ELIQUIS) 2.5 MG  TABS tablet Take 1 tablet (2.5 mg total) by mouth 2 (two) times daily.  Marland Kitchen diltiazem (CARDIZEM CD) 120 MG 24 hr capsule Take 1 capsule (120 mg total) by mouth daily.  . fluticasone (FLONASE) 50 MCG/ACT nasal spray Place 2 sprays into the nose daily.  . Fluticasone-Salmeterol (ADVAIR) 250-50 MCG/DOSE AEPB Inhale 1 puff into the lungs every 12 (twelve) hours.  . furosemide (LASIX) 20 MG tablet Take 20 mg by mouth daily.  Marland Kitchen ipratropium-albuterol (DUONEB) 0.5-2.5 (3) MG/3ML SOLN Take 3 mLs by nebulization every 6 (six) hours as needed (WHEEZING).  Marland Kitchen levothyroxine (SYNTHROID, LEVOTHROID) 88 MCG tablet Take 1 tablet (88 mcg total) by mouth daily before breakfast.  . Multiple Vitamins-Minerals (CENTRUM SILVER PO) Take 1 tablet by mouth daily.  . Multiple Vitamins-Minerals (SYSTANE ICAPS AREDS2) CAPS Take 1 capsule by mouth daily.   . pantoprazole (PROTONIX) 40 MG tablet Take 40 mg by mouth daily.   Marland Kitchen tiotropium (SPIRIVA) 18 MCG inhalation capsule Place 18 mcg into inhaler and inhale daily.  . Vitamin D, Ergocalciferol, (DRISDOL) 50000 UNITS CAPS Take 50,000 Units by mouth every Sunday.   . zinc oxide 20 % ointment Apply 1 application topically as needed for irritation. Apply to buttocks after every incontinent episode and as needed for redness  . [DISCONTINUED] azelastine (ASTELIN) 0.1 % nasal spray   . [DISCONTINUED] furosemide (LASIX) 20 MG tablet Take 1 tablet (20 mg total) by mouth every other day. (Patient taking differently: Take 20 mg by mouth daily. )   No facility-administered encounter medications on file as of 04/20/2018.     Review of Systems  Constitutional: Negative for appetite change, chills, fatigue and fever.  HENT: Positive for hearing loss. Negative for congestion, rhinorrhea, sinus pressure, sinus pain, sneezing and sore throat.   Eyes: Positive for visual disturbance. Negative for pain, discharge, redness and itching.       Wears eye glasses   Respiratory: Negative for chest  tightness and wheezing.        Chronic shortness of breath with exertion   Cardiovascular: Positive for leg swelling. Negative for chest pain.       New onset afib reports irregular heart beat worst during therapy  Gastrointestinal: Negative for abdominal distention, abdominal pain, constipation, diarrhea, nausea and vomiting.  Endocrine: Negative for cold intolerance and heat intolerance.  Genitourinary: Negative for dysuria, frequency and urgency.  Musculoskeletal: Positive for gait problem.  Skin: Negative for color change, pallor and rash.  Neurological: Negative for dizziness, light-headedness and headaches.    Immunization History  Administered Date(s) Administered  . Influenza-Unspecified 11/15/2013  . PPD Test 07/24/2011  . Zoster Recombinat (Shingrix) 12/10/2017   Pertinent  Health Maintenance Due  Topic Date Due  . PNA vac Low Risk Adult (1 of 2 - PCV13) 10/02/1984  . INFLUENZA VACCINE  09/15/2017  . DEXA SCAN  Completed   Fall Risk  01/28/2014  Falls  in the past year? Yes  Number falls in past yr: 1  Injury with Fall? No    Vitals:   04/20/18 1146  BP: 102/70  Pulse: 75  Resp: 20  Temp: 98.1 F (36.7 C)  TempSrc: Oral  SpO2: 92%  Weight: 103 lb (46.7 kg)  Height: 4\' 10"  (1.473 m)   Body mass index is 21.53 kg/m. Physical Exam Constitutional:      General: She is not in acute distress.    Appearance: She is normal weight.  HENT:     Head: Normocephalic.     Mouth/Throat:     Mouth: Mucous membranes are moist.     Pharynx: Oropharynx is clear. No oropharyngeal exudate or posterior oropharyngeal erythema.  Eyes:     General: No scleral icterus.       Right eye: No discharge.        Left eye: No discharge.     Conjunctiva/sclera: Conjunctivae normal.     Pupils: Pupils are equal, round, and reactive to light.  Neck:     Musculoskeletal: Normal range of motion. No neck rigidity or muscular tenderness.     Vascular: No carotid bruit.  Cardiovascular:      Rate and Rhythm: Rhythm irregular.     Pulses: Normal pulses.     Heart sounds: Normal heart sounds. No friction rub. No gallop.   Pulmonary:     Effort: Pulmonary effort is normal. No respiratory distress.     Breath sounds: No wheezing, rhonchi or rales.     Comments: Bilateral diminished breath sounds on continuous oxygen 2 liters nasal canula.   Chest:     Chest wall: No tenderness.  Abdominal:     General: Bowel sounds are normal. There is no distension.     Palpations: Abdomen is soft. There is no mass.     Tenderness: There is no abdominal tenderness. There is no right CVA tenderness, left CVA tenderness, guarding or rebound.  Musculoskeletal:     Comments: Unsteady gait on wheelchair and ambulates with walker.bilateral 1-2 + edema knee high ted hose in place slightly large for patient facility Nurse notified will order appropriate size.   Lymphadenopathy:     Cervical: No cervical adenopathy.  Skin:    General: Skin is warm and dry.     Coloration: Skin is not pale.     Findings: No erythema or rash.  Neurological:     Mental Status: She is alert and oriented to person, place, and time.     Cranial Nerves: No cranial nerve deficit.     Sensory: No sensory deficit.     Coordination: Coordination normal.     Gait: Gait abnormal.  Psychiatric:        Mood and Affect: Mood normal.        Speech: Speech normal.        Behavior: Behavior normal.        Thought Content: Thought content normal.        Judgment: Judgment normal.    Labs reviewed: Recent Labs    03/17/18 1604 03/17/18 1753 03/18/18 0505 03/19/18 0436 03/24/18  NA 134*  --  136 134* 137  K 3.7  --  3.0* 4.1 4.8  CL 99  --  99 100 103  CO2 23  --  25 24 27   GLUCOSE 98  --  99 117*  --   BUN 19  --  18 19 26*  CREATININE 1.04*  --  1.08*  0.86 1.0  CALCIUM 9.0  --  8.4* 8.1* 8.9  MG  --  1.7  --   --   --    Recent Labs    03/17/18 1604  AST 21  ALT 11  ALKPHOS 56  BILITOT 1.0  PROT 6.4*    ALBUMIN 3.1*   Recent Labs    03/17/18 1604 03/18/18 0505 03/19/18 0436 03/24/18 03/30/18 04/06/18  WBC 17.1* 13.2* 12.0* 8.6 6.9 10.0  NEUTROABS 12.8*  --   --   --   --   --   HGB 11.3* 9.9* 9.0* 9.5* 9.3* 10.6*  HCT 34.5* 28.9* 27.7* 28* 27* 33*  MCV 93.8 93.5 93.6  --   --   --   PLT 253 223 205 439* 446* 392   Lab Results  Component Value Date   TSH 0.983 03/18/2018   No results found for: HGBA1C No results found for: CHOL, HDL, LDLCALC, LDLDIRECT, TRIG, CHOLHDL  Significant Diagnostic Results in last 30 days:  No results found.  Assessment/Plan  1. New onset atrial fibrillation (HCC) Reports palpitation while working with Therapy unable to achieve her therapy goal due to palpitation.No chest pain or dizziness reported.continue on apixaban 2.5 mg tablet twice daily and diltiazem 120 mg 24 Hr capsule daily.Refer to cardiology for evaluation.   2.Edema   Bilateral lower extremities 1-2 + edema.No worsening cough, shortness of breath or abrupt weight gain.continue on Furosemide 20 mg tablet daily.Knee high ted hose size too loose.Facility Nurse notified will get small size for patient.encouraged to keep legs elevated while sitting. Continue to monitor weight.   Family/ staff Communication: Reviewed plan of care with patient and facility Nurse.   Labs/tests ordered: None   Dinah C Ngetich, NP

## 2018-04-25 ENCOUNTER — Encounter: Payer: Self-pay | Admitting: Nurse Practitioner

## 2018-04-25 ENCOUNTER — Non-Acute Institutional Stay (SKILLED_NURSING_FACILITY): Payer: Medicare Other | Admitting: Nurse Practitioner

## 2018-04-25 DIAGNOSIS — E8770 Fluid overload, unspecified: Secondary | ICD-10-CM

## 2018-04-25 DIAGNOSIS — J9621 Acute and chronic respiratory failure with hypoxia: Secondary | ICD-10-CM

## 2018-04-25 DIAGNOSIS — I4891 Unspecified atrial fibrillation: Secondary | ICD-10-CM

## 2018-04-25 DIAGNOSIS — R5383 Other fatigue: Secondary | ICD-10-CM | POA: Diagnosis not present

## 2018-04-25 DIAGNOSIS — J479 Bronchiectasis, uncomplicated: Secondary | ICD-10-CM | POA: Diagnosis not present

## 2018-04-25 NOTE — Assessment & Plan Note (Signed)
Continue O2 supplement.

## 2018-04-25 NOTE — Assessment & Plan Note (Signed)
Continue Furosemide 20mg qd, no edema BLE, no moist crackle in lungs. Observe. Update CMP/eGFR.  

## 2018-04-25 NOTE — Progress Notes (Signed)
Location:  [place] Room Number: 74 Place of Service:  SNF (31) Provider:  Marlana Latus  NP  [name], MD  Patient Care Team: [name], MD as PCP - General (Internal Medicine) Nicholas Lose, MD as Consulting Physician (Hematology and Oncology)  Extended Emergency Contact Information Primary Emergency Contact: Little Ishikawa Mobile Phone: [phone] Relation: Niece  Code Status:  DNR Goals of care: Advanced Directive information Advanced Directives 04/20/2018  Does Patient Have a Medical Advance Directive? Yes  Type of Advance Directive Out of facility DNR (pink MOST or yellow form);[place];Living will  Does patient want to make changes to medical advance directive? No - Patient declined  Copy of [place] in Chart? Yes - validated most recent copy scanned in chart (See row information)  Pre-existing out of facility DNR order (yellow form or pink MOST form) Yellow form placed in chart (order not valid for inpatient use);Pink MOST form placed in chart (order not valid for inpatient use)     Chief Complaint  Patient presents with  . Acute Visit    C/o- increased weakness    HPI:  Pt is a 85 y.o. female seen today for an acute visit for SOB, fatigue x3 days. She is O2 dependent. COPD/bronchiectasis, on Spiriva 1mg qd, DuoNeb q6h prn, Advair q12hr. Afib, heart rate is 100s, on Diltiazem 1216mqd, Eliquis 2.69m54mid. Volume overload,  SOB on Furosemide 65m41m.    Past Medical History:  Diagnosis Date  . Arthritis   . Constipation   . COPD (chronic obstructive pulmonary disease) (HCC)[place]. Fatigue   . Frequent UTI   . GERD (gastroesophageal reflux disease)   . History of shingles 2007  . Hyperlipemia   . Hypertension   . Hypothyroidism   . Multiple allergies   . Osteoporosis   . Paget's carcinoma of the nipple (HCC)[place]30/2015  . Pneumonia   . Seasonal allergies   . [facility]   no  residual  . Thyroid disease   [facility] Past Surgical History:  Procedure Laterality Date  . ABDOMINAL HYSTERECTOMY    . BREAST LUMPECTOMY     left breast  . CAROTID ENDARTERECTOMY Left June 10, 2008   CE  . EYE SURGERY     cataracts removed bilaterally  . TONSILLECTOMY      Allergies  Allergen Reactions  . Sulfa Antibiotics Shortness Of Breath and Palpitations  . Sulfamethoxazole Shortness Of Breath and Palpitations  . Tape Other (See Comments)    SKIN IS VERY FRAGILE- PLEASE USE AN ALTERNATIVE TO TAPE, AS THE SKIN TEARS EASILY!!  . Amoxicillin Rash  . Penicillin G Rash    Did it involve swelling of the face/tongue/throat, SOB, or low BP? Yes Did it involve sudden or severe rash/hives, skin peeling, or any reaction on the inside of your mouth or nose? Unk Did you need to seek medical attention at a hospital or doctor's office? Unk When did it last happen? "it was a long time ago" If all above answers are "NO", may proceed with cephalosporin use.   . PeMarland Kitchenicillins Rash    Did it involve swelling of the face/tongue/throat, SOB, or low BP? Yes Did it involve sudden or severe rash/hives, skin peeling, or any reaction on the inside of your mouth or nose? Unk Did you need to seek medical attention at a hospital or doctor's office? Unk When did it  last happen? "it was a long time ago" If all above answers are "NO", may proceed with cephalosporin use.     Outpatient Encounter Medications as of 04/25/2018  Medication Sig  . apixaban (ELIQUIS) 2.5 MG TABS tablet Take 1 tablet (2.5 mg total) by mouth 2 (two) times daily.  Marland Kitchen diltiazem (CARDIZEM CD) 120 MG 24 hr capsule Take 1 capsule (120 mg total) by mouth daily.  . fluticasone (FLONASE) 50 MCG/ACT nasal spray Place 2 sprays into the nose daily.  . Fluticasone-Salmeterol (ADVAIR) 250-50 MCG/DOSE AEPB Inhale 1 puff into the lungs every 12 (twelve) hours.  . furosemide (LASIX) 20 MG tablet Take 20 mg by mouth daily.    Marland Kitchen ipratropium-albuterol (DUONEB) 0.5-2.5 (3) MG/3ML SOLN Take 3 mLs by nebulization every 6 (six) hours as needed (WHEEZING).  Marland Kitchen levothyroxine (SYNTHROID, LEVOTHROID) 88 MCG tablet Take 1 tablet (88 mcg total) by mouth daily before breakfast.  . Multiple Vitamins-Minerals (CENTRUM SILVER PO) Take 1 tablet by mouth daily.  . Multiple Vitamins-Minerals (SYSTANE ICAPS AREDS2) CAPS Take 1 capsule by mouth daily.   . pantoprazole (PROTONIX) 40 MG tablet Take 40 mg by mouth daily.   Marland Kitchen tiotropium (SPIRIVA) 18 MCG inhalation capsule Place 18 mcg into inhaler and inhale daily.  . Vitamin D, Ergocalciferol, (DRISDOL) 50000 UNITS CAPS Take 50,000 Units by mouth every Sunday.   . zinc oxide 20 % ointment Apply 1 application topically as needed for irritation. Apply to buttocks after every incontinent episode and as needed for redness   No facility-administered encounter medications on file as of 04/25/2018.     Review of Systems  Constitutional: Positive for activity change, appetite change and fatigue. Negative for chills, diaphoresis and fever.  HENT: Positive for hearing loss. Negative for congestion and voice change.   Respiratory: Positive for cough and shortness of breath. Negative for wheezing.   Cardiovascular: Positive for leg swelling. Negative for chest pain and palpitations.  Gastrointestinal: Negative for abdominal distention, abdominal pain, constipation, diarrhea, nausea and vomiting.  Genitourinary: Negative for difficulty urinating, dysuria and urgency.  Musculoskeletal: Positive for gait problem.  Skin: Negative for color change and pallor.  Neurological: Negative for dizziness, facial asymmetry, speech difficulty, weakness and headaches.  Psychiatric/Behavioral: Negative for agitation, behavioral problems, hallucinations and sleep disturbance. The patient is not nervous/anxious.     Immunization History  Administered Date(s) Administered  . Influenza-Unspecified 11/15/2013  . PPD  Test 07/24/2011  . Zoster Recombinat (Shingrix) 12/10/2017   Pertinent  Health Maintenance Due  Topic Date Due  . PNA vac Low Risk Adult (1 of 2 - PCV13) 10/02/1984  . INFLUENZA VACCINE  09/15/2017  . DEXA SCAN  Completed   Fall Risk  01/28/2014  Falls in the past year? Yes  Number falls in past yr: 1  Injury with Fall? No   Functional Status Survey:    Vitals:   04/25/18 1058  BP: (!) 113/56  Pulse: (!) 109  Resp: 18  Temp: 97.9 F (36.6 C)  SpO2: 93%  Weight: 104 lb 6.4 oz (47.4 kg)  Height: 4' 10"  (1.473 m)   Body mass index is 21.82 kg/m. Physical Exam Constitutional:      General: She is not in acute distress.    Appearance: Normal appearance. She is not ill-appearing, toxic-appearing or diaphoretic.  HENT:     Head: Normocephalic and atraumatic.     Nose: Nose normal. No congestion.     Mouth/Throat:     Mouth: Mucous membranes are moist.  Eyes:  Extraocular Movements: Extraocular movements intact.     Pupils: Pupils are equal, round, and reactive to light.  Neck:     Musculoskeletal: Normal range of motion and neck supple.  Cardiovascular:     Rate and Rhythm: Tachycardia present. Rhythm irregular.     Heart sounds: No murmur.  Pulmonary:     Breath sounds: No stridor. No wheezing, rhonchi or rales.     Comments: Decreased air entry to both lungs.  Abdominal:     General: There is no distension.     Palpations: Abdomen is soft.     Tenderness: There is no abdominal tenderness. There is no guarding or rebound.  Musculoskeletal:     Right lower leg: Edema present.     Left lower leg: Edema present.     Comments: Trace edema BLE  Skin:    General: Skin is warm and dry.  Neurological:     General: No focal deficit present.     Mental Status: She is alert. Mental status is at baseline.     Comments: Oriented to person and place.   Psychiatric:        Mood and Affect: Mood normal.        Behavior: Behavior normal.        Thought Content: Thought  content normal.        Judgment: Judgment normal.     Labs reviewed: Recent Labs    03/17/18 1604 03/17/18 1753 03/18/18 0505 03/19/18 0436 03/24/18  NA 134*  --  136 134* 137  K 3.7  --  3.0* 4.1 4.8  CL 99  --  99 100 103  CO2 23  --  25 24 27   GLUCOSE 98  --  99 117*  --   BUN 19  --  18 19 26*  CREATININE 1.04*  --  1.08* 0.86 1.0  CALCIUM 9.0  --  8.4* 8.1* 8.9  MG  --  1.7  --   --   --    Recent Labs    03/17/18 1604  AST 21  ALT 11  ALKPHOS 56  BILITOT 1.0  PROT 6.4*  ALBUMIN 3.1*   Recent Labs    03/17/18 1604 03/18/18 0505 03/19/18 0436 03/24/18 03/30/18 04/06/18  WBC 17.1* 13.2* 12.0* 8.6 6.9 10.0  NEUTROABS 12.8*  --   --   --   --   --   HGB 11.3* 9.9* 9.0* 9.5* 9.3* 10.6*  HCT 34.5* 28.9* 27.7* 28* 27* 33*  MCV 93.8 93.5 93.6  --   --   --   PLT 253 223 205 439* 446* 392   Lab Results  Component Value Date   TSH 0.983 03/18/2018   No results found for: HGBA1C No results found for: CHOL, HDL, LDLCALC, LDLDIRECT, TRIG, CHOLHDL  Significant Diagnostic Results in last 30 days:  No results found.  Assessment/Plan Bronchiectasis without acute exacerbation Continue DuoNeb q6h prn, Spiriva 27mg qd. Will obtain CXR/ap, state CBC/diff, CMP/eGFR.   Volume overload Continue Furosemide 210mqd, no edema BLE, no moist crackle in lungs. Observe. Update CMP/eGFR.   New onset atrial fibrillation (HCC) Heart rate 100s, continue Diltiazem 12025md, Eliquis 2.5mg30md, f/u Cardiology. May consider increase Diltiazem to 240mg60mqd if heart rate is persistently >100s.   Acute on chronic respiratory failure with hypoxia (HCC) Continue O2 supplement.   Fatigue Multiple factorials, update CXR/ap, CBC/diff, CMP/eGFR. Observe.      Family/ staff Communication: plan of care reviewed  with the patient and charge nurse.    Labs/tests ordered:  CBC/diff, CMP/eGFR, CXR/ap  Time spend 25 minutes.

## 2018-04-25 NOTE — Assessment & Plan Note (Signed)
Multiple factorials, update CXR/ap, CBC/diff, CMP/eGFR. Observe.

## 2018-04-25 NOTE — Assessment & Plan Note (Addendum)
Continue DuoNeb q6h prn, Spiriva 53mg qd. Will obtain CXR/ap, state CBC/diff, CMP/eGFR.

## 2018-04-25 NOTE — Assessment & Plan Note (Signed)
Heart rate 100s, continue Diltiazem 120mg  qd, Eliquis 2.5mg  bid, f/u Cardiology. May consider increase Diltiazem to 240mg  po qd if heart rate is persistently >100s.

## 2018-04-26 ENCOUNTER — Non-Acute Institutional Stay (SKILLED_NURSING_FACILITY): Payer: Medicare Other | Admitting: Internal Medicine

## 2018-04-26 ENCOUNTER — Encounter: Payer: Self-pay | Admitting: Internal Medicine

## 2018-04-26 DIAGNOSIS — I5033 Acute on chronic diastolic (congestive) heart failure: Secondary | ICD-10-CM

## 2018-04-26 DIAGNOSIS — I5030 Unspecified diastolic (congestive) heart failure: Secondary | ICD-10-CM | POA: Insufficient documentation

## 2018-04-26 DIAGNOSIS — I4891 Unspecified atrial fibrillation: Secondary | ICD-10-CM

## 2018-04-26 DIAGNOSIS — N183 Chronic kidney disease, stage 3 unspecified: Secondary | ICD-10-CM

## 2018-04-26 LAB — CBC AND DIFFERENTIAL
HCT: 32 — AB (ref 36–46)
Hemoglobin: 10.8 — AB (ref 12.0–16.0)
Platelets: 369 (ref 150–399)
WBC: 9.9

## 2018-04-26 LAB — BASIC METABOLIC PANEL
BUN: 22 — AB (ref 4–21)
Creatinine: 1.2 — AB (ref 0.5–1.1)
Glucose: 145
Potassium: 4.1 (ref 3.4–5.3)
Sodium: 136 — AB (ref 137–147)

## 2018-04-26 NOTE — Progress Notes (Addendum)
Location:  [facility] of Service:  SNF [phone]) Provider: [name], MD  [name], MD  Patient Care Team: [name], MD as PCP - General (Internal Medicine) Nicholas Lose, MD as Consulting Physician (Hematology and Oncology)  Extended Emergency Contact Information Primary Emergency Contact: Little Ishikawa Mobile Phone: [phone] Relation: Niece  Code Status:  DNR Goals of care: Advanced Directive information Advanced Directives 04/20/2018  Does Patient Have a Medical Advance Directive? Yes  Type of Advance Directive Out of facility DNR (pink MOST or yellow form);[place];Living will  Does patient want to make changes to medical advance directive? No - Patient declined  Copy of [place] in Chart? Yes - validated most recent copy scanned in chart (See row information)  Pre-existing out of facility DNR order (yellow form or pink MOST form) Yellow form placed in chart (order not valid for inpatient use);Pink MOST form placed in chart (order not valid for inpatient use)     Chief Complaint  Patient presents with  . Acute Visit    SOB    HPI:  Pt is a 85 y.o. female seen today for an acute visit for Weight gain , SOB and Tachycardia Patient is in SNF for therapy. Patient was in the Hospital from 01/31-02/04 for Community Acquired Pneumonia,  New Onset A.Fib and CHF.  Patient has h/o H/o TIA in 2010 s/p CEA in 2010, COPD on oxygen at night, Hypertension,GERD,Hypothyroidism, Gastritis and Paget's disease of Nipple.  Patient was recently in the hospital and was diagnosed with New onset Atrial Fibrillation, Pneumonia and CHF For past few days she has been having increased SOB and swelling in her legs. She also c/o Cough mostly Dry. No Chest pain or Fever. Feels so tired that she is not working with therapy.  Initially she was doing well and was walking with a walker. Her weight is up now to 105 lbs from 99 lbs on  admission I had increased her Lasix on last visit . She did well initially but she continues to get worse. She also is Tachycardic especially with Therapy. Her HR goes to almost 120 and irregular.    Past Medical History:  Diagnosis Date  . Arthritis   . Constipation   . COPD (chronic obstructive pulmonary disease) ([place])   . Fatigue   . Frequent UTI   . GERD (gastroesophageal reflux disease)   . History of shingles 2007  . Hyperlipemia   . Hypertension   . Hypothyroidism   . Multiple allergies   . Osteoporosis   . Paget's carcinoma of the nipple ([place]) 03/16/2013  . Pneumonia   . Seasonal allergies   . Stroke [facility]) 2010   no residual  . Thyroid disease   . Uterine cancer [facility])    Past Surgical History:  Procedure Laterality Date  . ABDOMINAL HYSTERECTOMY    . BREAST LUMPECTOMY     left breast  . CAROTID ENDARTERECTOMY Left June 10, 2008   CE  . EYE SURGERY     cataracts removed bilaterally  . TONSILLECTOMY      Allergies  Allergen Reactions  . Sulfa Antibiotics Shortness Of Breath and Palpitations  . Sulfamethoxazole Shortness Of Breath and Palpitations  . Tape Other (See Comments)    SKIN IS VERY FRAGILE- PLEASE USE AN ALTERNATIVE TO TAPE, AS THE SKIN TEARS EASILY!!  . Amoxicillin Rash  . Penicillin G Rash    Did it involve swelling of the  face/tongue/throat, SOB, or low BP? Yes Did it involve sudden or severe rash/hives, skin peeling, or any reaction on the inside of your mouth or nose? Unk Did you need to seek medical attention at a hospital or doctor's office? Unk When did it last happen? "it was a long time ago" If all above answers are "NO", may proceed with cephalosporin use.   Marland Kitchen Penicillins Rash    Did it involve swelling of the face/tongue/throat, SOB, or low BP? Yes Did it involve sudden or severe rash/hives, skin peeling, or any reaction on the inside of your mouth or nose? Unk Did you need to seek medical attention at a hospital or doctor's office?  Unk When did it last happen? "it was a long time ago" If all above answers are "NO", may proceed with cephalosporin use.     Outpatient Encounter Medications as of 04/26/2018  Medication Sig  . apixaban (ELIQUIS) 2.5 MG TABS tablet Take 1 tablet (2.5 mg total) by mouth 2 (two) times daily.  Marland Kitchen diltiazem (CARDIZEM CD) 120 MG 24 hr capsule Take 1 capsule (120 mg total) by mouth daily.  . fluticasone (FLONASE) 50 MCG/ACT nasal spray Place 2 sprays into the nose daily.  . Fluticasone-Salmeterol (ADVAIR) 250-50 MCG/DOSE AEPB Inhale 1 puff into the lungs every 12 (twelve) hours.  . furosemide (LASIX) 20 MG tablet Take 20 mg by mouth daily.  Marland Kitchen ipratropium-albuterol (DUONEB) 0.5-2.5 (3) MG/3ML SOLN Take 3 mLs by nebulization every 6 (six) hours as needed (WHEEZING).  Marland Kitchen levothyroxine (SYNTHROID, LEVOTHROID) 88 MCG tablet Take 1 tablet (88 mcg total) by mouth daily before breakfast.  . Multiple Vitamins-Minerals (CENTRUM SILVER PO) Take 1 tablet by mouth daily.  . Multiple Vitamins-Minerals (SYSTANE ICAPS AREDS2) CAPS Take 1 capsule by mouth daily.   . pantoprazole (PROTONIX) 40 MG tablet Take 40 mg by mouth daily.   Marland Kitchen tiotropium (SPIRIVA) 18 MCG inhalation capsule Place 18 mcg into inhaler and inhale daily.  . Vitamin D, Ergocalciferol, (DRISDOL) 50000 UNITS CAPS Take 50,000 Units by mouth every Sunday.   . zinc oxide 20 % ointment Apply 1 application topically as needed for irritation. Apply to buttocks after every incontinent episode and as needed for redness   No facility-administered encounter medications on file as of 04/26/2018.     Review of Systems  Constitutional: Positive for activity change and unexpected weight change.  HENT: Negative.   Respiratory: Positive for cough and shortness of breath.   Cardiovascular: Positive for leg swelling.  Gastrointestinal: Negative.   Genitourinary: Negative.   Musculoskeletal: Negative.   Skin: Negative.   Neurological: Positive for weakness.   Psychiatric/Behavioral: Negative.   All other systems reviewed and are negative.   Immunization History  Administered Date(s) Administered  . Influenza-Unspecified 11/15/2013  . PPD Test 07/24/2011  . Zoster Recombinat (Shingrix) 12/10/2017   Pertinent  Health Maintenance Due  Topic Date Due  . PNA vac Low Risk Adult (1 of 2 - PCV13) 10/02/1984  . INFLUENZA VACCINE  09/15/2017  . DEXA SCAN  Completed   Fall Risk  01/28/2014  Falls in the past year? Yes  Number falls in past yr: 1  Injury with Fall? No   Functional Status Survey:    There were no vitals filed for this visit. There is no height or weight on file to calculate BMI. Physical Exam Vitals signs reviewed.  Constitutional:      Appearance: Normal appearance.  HENT:     Head: Normocephalic.     Nose:  Nose normal.     Mouth/Throat:     Mouth: Mucous membranes are moist.     Pharynx: Oropharynx is clear.  Eyes:     Pupils: Pupils are equal, round, and reactive to light.  Neck:     Musculoskeletal: Neck supple.  Cardiovascular:     Rate and Rhythm: Normal rate and regular rhythm.     Pulses: Normal pulses.     Heart sounds: Normal heart sounds. No murmur.  Pulmonary:     Effort: Pulmonary effort is normal. No respiratory distress.     Breath sounds: No wheezing or rales.     Comments: Decreased Breadth Sounds Bilateral Abdominal:     General: Abdomen is flat. Bowel sounds are normal. There is no distension.     Palpations: Abdomen is soft.     Tenderness: There is no abdominal tenderness. There is no guarding.  Musculoskeletal:     Comments: Has moderate Swelling Bilateral  Skin:    General: Skin is warm and dry.  Neurological:     General: No focal deficit present.     Mental Status: She is alert and oriented to person, place, and time.  Psychiatric:        Mood and Affect: Mood normal.        Thought Content: Thought content normal.        Judgment: Judgment normal.     Labs reviewed: Recent  Labs    03/17/18 1604 03/17/18 1753 03/18/18 0505 03/19/18 0436 03/24/18  NA 134*  --  136 134* 137  K 3.7  --  3.0* 4.1 4.8  CL 99  --  99 100 103  CO2 23  --  25 24 27   GLUCOSE 98  --  99 117*  --   BUN 19  --  18 19 26*  CREATININE 1.04*  --  1.08* 0.86 1.0  CALCIUM 9.0  --  8.4* 8.1* 8.9  MG  --  1.7  --   --   --    Recent Labs    03/17/18 1604  AST 21  ALT 11  ALKPHOS 56  BILITOT 1.0  PROT 6.4*  ALBUMIN 3.1*   Recent Labs    03/17/18 1604 03/18/18 0505 03/19/18 0436 03/24/18 03/30/18 04/06/18  WBC 17.1* 13.2* 12.0* 8.6 6.9 10.0  NEUTROABS 12.8*  --   --   --   --   --   HGB 11.3* 9.9* 9.0* 9.5* 9.3* 10.6*  HCT 34.5* 28.9* 27.7* 28* 27* 33*  MCV 93.8 93.5 93.6  --   --   --   PLT 253 223 205 439* 446* 392   Lab Results  Component Value Date   TSH 0.983 03/18/2018   No results found for: HGBA1C No results found for: CHOL, HDL, LDLCALC, LDLDIRECT, TRIG, CHOLHDL  Significant Diagnostic Results in last 30 days:  No results found.  Assessment/Plan Pneumonia and Diastolic CHF Chest Xray showed Bilateral infiltrate with Pleural Effusion No White Count She was started on Doxycyline for 10 days But I feel this is more CHF. Will discontinue her lasix and start her on Torsemide 20 mg Check BNP. Repeat BMP in 1 week Last Creat was stable in the facility  New Onset Atrial Fibrilation She is in Atrial Fib  We are trying to get her appointment with Cardiology Will start her on Lopressor 12.5 mg BID BP is soft Will follow her closely  COPD On Oxygen right now .And duo Nebs On Advair Oxygen  2l  Hypothyroidism Normal in hospital  Same dose of Levothyroxine CKD (chronic kidney disease) Creat stable Repeat BMP to follow creat Paget's disease and intraductal carcinoma of left breast  Follows With Oncology Chronic anemia Hgb Is stable Deconditioning Started Therapy Planning to go back to her Apartment  Addendum This morning Patient is worse with  HR of 140, SOB with Sats down to 86 on 2l D/W the Nurse and Patient and will send her to the hospital  Family/ staff Communication:   Labs/tests ordered:  BMP in 1 week Total time spent in this patient care encounter was 25_ minutes; greater than 50% of the visit spent counseling patient, reviewing records , Labs and coordinating care for problems addressed at this encounter.

## 2018-04-27 ENCOUNTER — Telehealth: Payer: Self-pay

## 2018-04-27 ENCOUNTER — Inpatient Hospital Stay (HOSPITAL_COMMUNITY)
Admission: EM | Admit: 2018-04-27 | Discharge: 2018-05-02 | DRG: 291 | Disposition: A | Discharge status: Skilled Nursing Facility | Payer: Medicare Other | Source: Skilled Nursing Facility | Attending: Family Medicine | Admitting: Family Medicine

## 2018-04-27 ENCOUNTER — Other Ambulatory Visit: Payer: Self-pay

## 2018-04-27 ENCOUNTER — Emergency Department (HOSPITAL_COMMUNITY): Payer: Medicare Other

## 2018-04-27 ENCOUNTER — Encounter (HOSPITAL_COMMUNITY): Payer: Self-pay | Admitting: Emergency Medicine

## 2018-04-27 DIAGNOSIS — Z8701 Personal history of pneumonia (recurrent): Secondary | ICD-10-CM | POA: Diagnosis not present

## 2018-04-27 DIAGNOSIS — Z90710 Acquired absence of both cervix and uterus: Secondary | ICD-10-CM | POA: Diagnosis not present

## 2018-04-27 DIAGNOSIS — E43 Unspecified severe protein-calorie malnutrition: Secondary | ICD-10-CM | POA: Diagnosis present

## 2018-04-27 DIAGNOSIS — I10 Essential (primary) hypertension: Secondary | ICD-10-CM | POA: Diagnosis not present

## 2018-04-27 DIAGNOSIS — M810 Age-related osteoporosis without current pathological fracture: Secondary | ICD-10-CM | POA: Diagnosis present

## 2018-04-27 DIAGNOSIS — E032 Hypothyroidism due to medicaments and other exogenous substances: Secondary | ICD-10-CM | POA: Diagnosis not present

## 2018-04-27 DIAGNOSIS — E039 Hypothyroidism, unspecified: Secondary | ICD-10-CM | POA: Diagnosis present

## 2018-04-27 DIAGNOSIS — Z825 Family history of asthma and other chronic lower respiratory diseases: Secondary | ICD-10-CM

## 2018-04-27 DIAGNOSIS — I509 Heart failure, unspecified: Secondary | ICD-10-CM

## 2018-04-27 DIAGNOSIS — R0602 Shortness of breath: Secondary | ICD-10-CM

## 2018-04-27 DIAGNOSIS — N183 Chronic kidney disease, stage 3 (moderate): Secondary | ICD-10-CM

## 2018-04-27 DIAGNOSIS — J441 Chronic obstructive pulmonary disease with (acute) exacerbation: Secondary | ICD-10-CM | POA: Diagnosis not present

## 2018-04-27 DIAGNOSIS — J189 Pneumonia, unspecified organism: Secondary | ICD-10-CM | POA: Diagnosis not present

## 2018-04-27 DIAGNOSIS — Z9889 Other specified postprocedural states: Secondary | ICD-10-CM

## 2018-04-27 DIAGNOSIS — I130 Hypertensive heart and chronic kidney disease with heart failure and stage 1 through stage 4 chronic kidney disease, or unspecified chronic kidney disease: Principal | ICD-10-CM | POA: Diagnosis present

## 2018-04-27 DIAGNOSIS — Z8249 Family history of ischemic heart disease and other diseases of the circulatory system: Secondary | ICD-10-CM

## 2018-04-27 DIAGNOSIS — Z79899 Other long term (current) drug therapy: Secondary | ICD-10-CM | POA: Diagnosis not present

## 2018-04-27 DIAGNOSIS — J9621 Acute and chronic respiratory failure with hypoxia: Secondary | ICD-10-CM | POA: Diagnosis present

## 2018-04-27 DIAGNOSIS — I4891 Unspecified atrial fibrillation: Secondary | ICD-10-CM | POA: Diagnosis not present

## 2018-04-27 DIAGNOSIS — R0902 Hypoxemia: Secondary | ICD-10-CM

## 2018-04-27 DIAGNOSIS — N184 Chronic kidney disease, stage 4 (severe): Secondary | ICD-10-CM | POA: Diagnosis present

## 2018-04-27 DIAGNOSIS — Z87891 Personal history of nicotine dependence: Secondary | ICD-10-CM | POA: Diagnosis not present

## 2018-04-27 DIAGNOSIS — Z7951 Long term (current) use of inhaled steroids: Secondary | ICD-10-CM | POA: Diagnosis not present

## 2018-04-27 DIAGNOSIS — I5033 Acute on chronic diastolic (congestive) heart failure: Secondary | ICD-10-CM | POA: Diagnosis present

## 2018-04-27 DIAGNOSIS — J90 Pleural effusion, not elsewhere classified: Secondary | ICD-10-CM

## 2018-04-27 DIAGNOSIS — I480 Paroxysmal atrial fibrillation: Secondary | ICD-10-CM | POA: Diagnosis not present

## 2018-04-27 DIAGNOSIS — R9389 Abnormal findings on diagnostic imaging of other specified body structures: Secondary | ICD-10-CM

## 2018-04-27 DIAGNOSIS — Z79890 Hormone replacement therapy (postmenopausal): Secondary | ICD-10-CM

## 2018-04-27 DIAGNOSIS — L89002 Pressure ulcer of unspecified elbow, stage 2: Secondary | ICD-10-CM | POA: Diagnosis not present

## 2018-04-27 DIAGNOSIS — I110 Hypertensive heart disease with heart failure: Secondary | ICD-10-CM | POA: Diagnosis not present

## 2018-04-27 DIAGNOSIS — N179 Acute kidney failure, unspecified: Secondary | ICD-10-CM | POA: Diagnosis not present

## 2018-04-27 DIAGNOSIS — E871 Hypo-osmolality and hyponatremia: Secondary | ICD-10-CM | POA: Diagnosis present

## 2018-04-27 DIAGNOSIS — J449 Chronic obstructive pulmonary disease, unspecified: Secondary | ICD-10-CM | POA: Diagnosis present

## 2018-04-27 DIAGNOSIS — J440 Chronic obstructive pulmonary disease with acute lower respiratory infection: Secondary | ICD-10-CM | POA: Diagnosis present

## 2018-04-27 DIAGNOSIS — I4819 Other persistent atrial fibrillation: Secondary | ICD-10-CM | POA: Diagnosis not present

## 2018-04-27 DIAGNOSIS — Z66 Do not resuscitate: Secondary | ICD-10-CM | POA: Diagnosis present

## 2018-04-27 DIAGNOSIS — Z8673 Personal history of transient ischemic attack (TIA), and cerebral infarction without residual deficits: Secondary | ICD-10-CM

## 2018-04-27 DIAGNOSIS — Z8542 Personal history of malignant neoplasm of other parts of uterus: Secondary | ICD-10-CM

## 2018-04-27 DIAGNOSIS — K219 Gastro-esophageal reflux disease without esophagitis: Secondary | ICD-10-CM | POA: Diagnosis not present

## 2018-04-27 DIAGNOSIS — R000 Tachycardia, unspecified: Secondary | ICD-10-CM | POA: Diagnosis not present

## 2018-04-27 DIAGNOSIS — L8990 Pressure ulcer of unspecified site, unspecified stage: Secondary | ICD-10-CM

## 2018-04-27 DIAGNOSIS — Z7901 Long term (current) use of anticoagulants: Secondary | ICD-10-CM | POA: Diagnosis not present

## 2018-04-27 DIAGNOSIS — R091 Pleurisy: Secondary | ICD-10-CM | POA: Diagnosis not present

## 2018-04-27 DIAGNOSIS — Z853 Personal history of malignant neoplasm of breast: Secondary | ICD-10-CM

## 2018-04-27 DIAGNOSIS — J9612 Chronic respiratory failure with hypercapnia: Secondary | ICD-10-CM | POA: Diagnosis not present

## 2018-04-27 DIAGNOSIS — R5381 Other malaise: Secondary | ICD-10-CM | POA: Diagnosis not present

## 2018-04-27 DIAGNOSIS — E785 Hyperlipidemia, unspecified: Secondary | ICD-10-CM | POA: Diagnosis present

## 2018-04-27 DIAGNOSIS — R062 Wheezing: Secondary | ICD-10-CM

## 2018-04-27 DIAGNOSIS — J438 Other emphysema: Secondary | ICD-10-CM

## 2018-04-27 DIAGNOSIS — R0600 Dyspnea, unspecified: Secondary | ICD-10-CM

## 2018-04-27 DIAGNOSIS — J9601 Acute respiratory failure with hypoxia: Secondary | ICD-10-CM

## 2018-04-27 DIAGNOSIS — Z9981 Dependence on supplemental oxygen: Secondary | ICD-10-CM | POA: Diagnosis not present

## 2018-04-27 DIAGNOSIS — J984 Other disorders of lung: Secondary | ICD-10-CM | POA: Diagnosis not present

## 2018-04-27 DIAGNOSIS — M6281 Muscle weakness (generalized): Secondary | ICD-10-CM | POA: Diagnosis not present

## 2018-04-27 DIAGNOSIS — J918 Pleural effusion in other conditions classified elsewhere: Secondary | ICD-10-CM | POA: Diagnosis not present

## 2018-04-27 DIAGNOSIS — I6529 Occlusion and stenosis of unspecified carotid artery: Secondary | ICD-10-CM | POA: Diagnosis not present

## 2018-04-27 DIAGNOSIS — D649 Anemia, unspecified: Secondary | ICD-10-CM | POA: Diagnosis present

## 2018-04-27 DIAGNOSIS — Z7401 Bed confinement status: Secondary | ICD-10-CM | POA: Diagnosis not present

## 2018-04-27 DIAGNOSIS — R2681 Unsteadiness on feet: Secondary | ICD-10-CM | POA: Diagnosis not present

## 2018-04-27 DIAGNOSIS — Z6822 Body mass index (BMI) 22.0-22.9, adult: Secondary | ICD-10-CM

## 2018-04-27 DIAGNOSIS — M2550 Pain in unspecified joint: Secondary | ICD-10-CM | POA: Diagnosis not present

## 2018-04-27 DIAGNOSIS — J9611 Chronic respiratory failure with hypoxia: Secondary | ICD-10-CM | POA: Diagnosis not present

## 2018-04-27 LAB — CBC
HCT: 33.1 % — ABNORMAL LOW (ref 36.0–46.0)
HEMOGLOBIN: 10.2 g/dL — AB (ref 12.0–15.0)
MCH: 29.9 pg (ref 26.0–34.0)
MCHC: 30.8 g/dL (ref 30.0–36.0)
MCV: 97.1 fL (ref 80.0–100.0)
Platelets: 360 10*3/uL (ref 150–400)
RBC: 3.41 MIL/uL — ABNORMAL LOW (ref 3.87–5.11)
RDW: 14.6 % (ref 11.5–15.5)
WBC: 11 10*3/uL — ABNORMAL HIGH (ref 4.0–10.5)
nRBC: 0 % (ref 0.0–0.2)

## 2018-04-27 LAB — COMPREHENSIVE METABOLIC PANEL
ALK PHOS: 66 U/L (ref 38–126)
ALT: 16 U/L (ref 0–44)
AST: 26 U/L (ref 15–41)
Albumin: 3 g/dL — ABNORMAL LOW (ref 3.5–5.0)
Anion gap: 10 (ref 5–15)
BUN: 18 mg/dL (ref 8–23)
CO2: 24 mmol/L (ref 22–32)
Calcium: 8.8 mg/dL — ABNORMAL LOW (ref 8.9–10.3)
Chloride: 98 mmol/L (ref 98–111)
Creatinine, Ser: 1.23 mg/dL — ABNORMAL HIGH (ref 0.44–1.00)
GFR calc Af Amer: 42 mL/min — ABNORMAL LOW (ref >60)
GFR calc non Af Amer: 36 mL/min — ABNORMAL LOW (ref >60)
Glucose, Bld: 121 mg/dL — ABNORMAL HIGH (ref 70–99)
Potassium: 4.4 mmol/L (ref 3.5–5.1)
Sodium: 132 mmol/L — ABNORMAL LOW (ref 135–145)
Total Bilirubin: 0.6 mg/dL (ref 0.3–1.2)
Total Protein: 5.8 g/dL — ABNORMAL LOW (ref 6.5–8.1)

## 2018-04-27 LAB — TROPONIN I: Troponin I: <0.03 ng/mL (ref <0.03)

## 2018-04-27 LAB — BRAIN NATRIURETIC PEPTIDE: B Natriuretic Peptide: 289.4 pg/mL — ABNORMAL HIGH (ref 0.0–100.0)

## 2018-04-27 LAB — TSH: TSH: 10.029 u[IU]/mL — ABNORMAL HIGH (ref 0.350–4.500)

## 2018-04-27 LAB — INFLUENZA PANEL BY PCR (TYPE A & B)
INFLAPCR: NEGATIVE
Influenza B By PCR: NEGATIVE

## 2018-04-27 LAB — MRSA PCR SCREENING: MRSA by PCR: NEGATIVE

## 2018-04-27 MED ORDER — MOMETASONE FURO-FORMOTEROL FUM 200-5 MCG/ACT IN AERO
2.0000 | INHALATION_SPRAY | Freq: Two times a day (BID) | RESPIRATORY_TRACT | Status: DC
  Administered 2018-04-27 – 2018-05-02 (×10): 2 via RESPIRATORY_TRACT
  Filled 2018-04-27: qty 8.8

## 2018-04-27 MED ORDER — FUROSEMIDE 10 MG/ML IJ SOLN
80.0000 mg | Freq: Once | INTRAMUSCULAR | Status: AC
  Administered 2018-04-27: 80 mg via INTRAVENOUS
  Filled 2018-04-27: qty 8

## 2018-04-27 MED ORDER — METOPROLOL TARTRATE 25 MG PO TABS
12.5000 mg | ORAL_TABLET | Freq: Two times a day (BID) | ORAL | Status: DC
  Administered 2018-04-27 – 2018-05-02 (×10): 12.5 mg via ORAL
  Filled 2018-04-27 (×10): qty 1

## 2018-04-27 MED ORDER — ASPIRIN EC 81 MG PO TBEC
81.0000 mg | DELAYED_RELEASE_TABLET | Freq: Every day | ORAL | Status: DC
  Administered 2018-04-28 – 2018-04-29 (×2): 81 mg via ORAL
  Filled 2018-04-27 (×2): qty 1

## 2018-04-27 MED ORDER — IPRATROPIUM-ALBUTEROL 0.5-2.5 (3) MG/3ML IN SOLN
3.0000 mL | Freq: Three times a day (TID) | RESPIRATORY_TRACT | Status: DC
  Administered 2018-04-27 – 2018-04-28 (×3): 3 mL via RESPIRATORY_TRACT
  Filled 2018-04-27 (×3): qty 3

## 2018-04-27 MED ORDER — PANTOPRAZOLE SODIUM 40 MG PO TBEC
40.0000 mg | DELAYED_RELEASE_TABLET | Freq: Every day | ORAL | Status: DC
  Administered 2018-04-28 – 2018-05-02 (×5): 40 mg via ORAL
  Filled 2018-04-27 (×5): qty 1

## 2018-04-27 MED ORDER — SODIUM CHLORIDE 0.9% FLUSH
3.0000 mL | Freq: Two times a day (BID) | INTRAVENOUS | Status: DC
  Administered 2018-04-27 – 2018-05-02 (×10): 3 mL via INTRAVENOUS

## 2018-04-27 MED ORDER — APIXABAN 2.5 MG PO TABS
2.5000 mg | ORAL_TABLET | Freq: Two times a day (BID) | ORAL | Status: DC
  Administered 2018-04-27 – 2018-04-29 (×4): 2.5 mg via ORAL
  Filled 2018-04-27 (×4): qty 1

## 2018-04-27 MED ORDER — DILTIAZEM HCL ER COATED BEADS 120 MG PO CP24
120.0000 mg | ORAL_CAPSULE | Freq: Every day | ORAL | Status: DC
  Administered 2018-04-27 – 2018-05-02 (×6): 120 mg via ORAL
  Filled 2018-04-27 (×6): qty 1

## 2018-04-27 MED ORDER — ONDANSETRON HCL 4 MG/2ML IJ SOLN: 4.0000 mg | Freq: Four times a day (QID) | INTRAMUSCULAR | Status: DC | PRN

## 2018-04-27 MED ORDER — SODIUM CHLORIDE 0.9% FLUSH: 3.0000 mL | INTRAVENOUS | Status: DC | PRN

## 2018-04-27 MED ORDER — SODIUM CHLORIDE 0.9 % IV SOLN
250.0000 mL | INTRAVENOUS | Status: DC | PRN
  Administered 2018-04-29: 250 mL via INTRAVENOUS

## 2018-04-27 MED ORDER — FUROSEMIDE 10 MG/ML IJ SOLN: 80.0000 mg | Freq: Two times a day (BID) | INTRAMUSCULAR | Status: DC

## 2018-04-27 MED ORDER — FUROSEMIDE 10 MG/ML IJ SOLN
40.0000 mg | Freq: Two times a day (BID) | INTRAMUSCULAR | Status: DC
  Administered 2018-04-27 – 2018-04-28 (×2): 40 mg via INTRAVENOUS
  Filled 2018-04-27 (×2): qty 4

## 2018-04-27 MED ORDER — LEVOTHYROXINE SODIUM 88 MCG PO TABS
88.0000 ug | ORAL_TABLET | Freq: Every day | ORAL | Status: DC
  Administered 2018-04-28 – 2018-04-30 (×3): 88 ug via ORAL
  Filled 2018-04-27 (×3): qty 1

## 2018-04-27 MED ORDER — ALBUTEROL SULFATE (2.5 MG/3ML) 0.083% IN NEBU: 2.5000 mg | INHALATION_SOLUTION | RESPIRATORY_TRACT | Status: DC | PRN

## 2018-04-27 MED ORDER — FLUTICASONE PROPIONATE 50 MCG/ACT NA SUSP
2.0000 | Freq: Every day | NASAL | Status: DC
  Administered 2018-04-28 – 2018-05-02 (×5): 2 via NASAL
  Filled 2018-04-27: qty 16

## 2018-04-27 MED ORDER — ACETAMINOPHEN 325 MG PO TABS
650.0000 mg | ORAL_TABLET | ORAL | Status: DC | PRN
  Administered 2018-05-01: 650 mg via ORAL
  Filled 2018-04-27: qty 2

## 2018-04-27 NOTE — Progress Notes (Signed)
DNR bracelet placed on pt per order.

## 2018-04-27 NOTE — Progress Notes (Signed)
Pt's O2 was in 80-82% and HR 112  when RN came to the pt's room. Pt is on 8L now and O2 is 95%. Scheduled Furosemide 40 mg and  Metoprolol 12.5 mg was given per order. [facility] is notified. Per MD no additional interventions at this time. Resp at the bedside. Pt is comfortable, no SOB or chest pain. Will continue to monitor.

## 2018-04-27 NOTE — Evaluation (Signed)
Physical Therapy Evaluation Patient Details Name: Katherine [name] MRN: [id number] DOB: 05-13-1917 Today's Date: 04/27/2018   History of Present Illness  Pt is a 85 y.o. female admitted from [facility] SNF on 04/27/18 with SOB, cough and LE edema; worked up for worsening CHF. PMH includes CVA, HTN, COPD. Of note, recent admission for respiratory distress thought to be due to combination of CAP and CHF.    Clinical Impression  Pt presents with an overall decrease in functional mobility secondary to above. PTA, pt ambulatory with RW, assist for ADLs from staff as needed. Today, pt performing mobility with RW and min guard for balance; limited by fatigue, DOE 2/4. Pt would benefit from continued acute PT services to maximize functional mobility and independence prior to d/c with continued PT services at SNF.     Follow Up Recommendations SNF(return to [facility])    Equipment Recommendations  None recommended by PT    Recommendations for Other Services       Precautions / Restrictions Precautions Precautions: Fall Precaution Comments: Wears 2L O2 baseline Restrictions Weight Bearing Restrictions: No      Mobility  Bed Mobility Overal bed mobility: Independent                Transfers Overall transfer level: Needs assistance Equipment used: Rolling walker (2 wheeled) Transfers: Sit to/from Stand Sit to Stand: Min guard         General transfer comment: Min guard for balance, no physical assist required; decreased eccentric control into sitting  Ambulation/Gait Ambulation/Gait assistance: Min guard Gait Distance (Feet): 5 Feet Assistive device: Rolling walker (2 wheeled) Gait Pattern/deviations: Step-to pattern;Trunk flexed Gait velocity: Decreased   General Gait Details: Slow steps to recliner with RW and min guard for balance  Stairs            Wheelchair Mobility    Modified Rankin (Stroke Patients Only)       Balance Overall balance  assessment: Needs assistance   Sitting balance-Leahy Scale: Good       Standing balance-Leahy Scale: Poor Standing balance comment: Reliant on UE support                             Pertinent Vitals/Pain Pain Assessment: No/denies pain    Home Living Family/patient expects to be discharged to:: Skilled nursing facility                      Prior Function Level of Independence: Needs assistance   Gait / Transfers Assistance Needed: Ambulatory with RW. Working with PT at [facility]. Denies falls in past 6 months. Enjoys reading  ADL's / Homemaking Assistance Needed: Has assist for bathing. Pt dresses self. Meals provided  Comments: Wears 2L O2 baseline     Hand Dominance        Extremity/Trunk Assessment   Upper Extremity Assessment Upper Extremity Assessment: Generalized weakness    Lower Extremity Assessment Lower Extremity Assessment: Generalized weakness    Cervical / Trunk Assessment Cervical / Trunk Assessment: Kyphotic  Communication   Communication: No difficulties  Cognition Arousal/Alertness: Awake/alert Behavior During Therapy: WFL for tasks assessed/performed Overall Cognitive Status: Within Functional Limits for tasks assessed                                        General  Comments      Exercises     Assessment/Plan    PT Assessment Patient needs continued PT services  PT Problem List Decreased strength;Decreased activity tolerance;Decreased balance;Decreased mobility;Cardiopulmonary status limiting activity       PT Treatment Interventions DME instruction;Gait training;Functional mobility training;Therapeutic activities;Therapeutic exercise;Balance training;Patient/family education    PT Goals (Current goals can be found in the Care Plan section)  Acute Rehab PT Goals Patient Stated Goal: Return home breathing better PT Goal Formulation: With patient Time For Goal Achievement: 05/11/18 Potential to  Achieve Goals: Good    Frequency Min 2X/week   Barriers to discharge        Co-evaluation               AM-PAC PT "6 Clicks" Mobility  Outcome Measure Help needed turning from your back to your side while in a flat bed without using bedrails?: None Help needed moving from lying on your back to sitting on the side of a flat bed without using bedrails?: None Help needed moving to and from a bed to a chair (including a wheelchair)?: A Little Help needed standing up from a chair using your arms (e.g., wheelchair or bedside chair)?: A Little Help needed to walk in hospital room?: A Little Help needed climbing 3-5 steps with a railing? : A Lot 6 Click Score: 19    End of Session Equipment Utilized During Treatment: Oxygen Activity Tolerance: Patient tolerated treatment well;Patient limited by fatigue Patient left: in chair;with call bell/phone within reach Nurse Communication: Mobility status PT Visit Diagnosis: Other abnormalities of gait and mobility (R26.89)    Time: 2703-5009 PT Time Calculation (min) (ACUTE ONLY): 17 min   Charges:   PT Evaluation $PT Eval Moderate Complexity: Pur[place], PT, DPT Acute Rehabilitation Services  Pager [phone] Office [place] 04/27/2018, 5:30 PM

## 2018-04-27 NOTE — Progress Notes (Signed)
RN rounded on pt. Pt is eating dinner. Pt states she does not need anything at this time.

## 2018-04-27 NOTE — Progress Notes (Signed)
Pt taken off bipap and placed on 3L [place] at this time. No distress, no increased WOB, VS within normal limits. RT will continue to monitor

## 2018-04-27 NOTE — H&P (Signed)
History and Physical    Katherine [name] MVH:[id number] DOB: 21-Jul-1917 DOA: 04/27/2018  PCP: [name], MD; Lavone Orn Consultants:  Prudence Davidson - podiatry; Lindi Adie - oncology; Fields - vascular; Tamala Julian - cardiology (hoping to get in with him) Patient coming from: [facility] (moved from independent living about a month ago); NOK: Tobey Bride, [phone]  Chief Complaint: SOB  HPI: Katherine [name] is a 85 y.o. female with medical history significant of uterine cancer; CVA; Paget's carcinoma of the nipple; hypothyroidism; HTN; HLD; and COPD presenting with SOB.  She woke up needing to go to the restroom and she just could not catch her breath.  She was really struggling to breathe.  She was previously on an antibiotic for PNA and had an xray upon completion and was diagnosed with CHF.  The facility was again concerned about PNA.  They started antibiotics on Tuesday, doxycycline, without improvement.  She did not feel well the last night or two with SOB and difficulty sleeping.  +cough, nonproductive.  +LE edema, changed from Lasix to To[place]mide after having increased the dose a week or two ago.  Weight had increased from 99 to 106.  No fever.  She was hospitalized 1/31-2/4 for CAP; acute on chronic diastolic CHF; and new onset PAF.  She was started on Eliquis, treated with Azithromycin and Vantin, discharged on 24/7 O2, and diuresed with Lasix.  ED Course:  Likely new/worsening CHF.  Echo in February with diastolic CHF.  Has had antibiotics, yesterday with new afib, stopped Lasix and started Toresemide.  In mid-80s and worse.  Sent her in and on BIPAP which has now been weaned.  Reasonable family, DNR and no intubation.  Review of Systems: As per HPI; otherwise review of systems reviewed and negative.   Ambulatory Status:  Ambulates with a walker  Past Medical History:  Diagnosis Date   Arthritis    Constipation    COPD (chronic obstructive pulmonary disease) ([place])    Fatigue     Frequent UTI    GERD (gastroesophageal reflux disease)    History of shingles 2007   Hyperlipemia    Hypertension    Hypothyroidism    Multiple allergies    Osteoporosis    Paget's carcinoma of the nipple ([place]) 03/16/2013   Pneumonia    Seasonal allergies    Stroke ([place]) 2010   no residual   Thyroid disease    Uterine cancer [facility])     Past Surgical History:  Procedure Laterality Date   ABDOMINAL HYSTERECTOMY     BREAST LUMPECTOMY     left breast   CAROTID ENDARTERECTOMY Left June 10, 2008   CE   EYE SURGERY     cataracts removed bilaterally   TONSILLECTOMY      Social History   Socioeconomic History   Marital status: Widowed    Spouse name: Not on file   Number of children: Not on file   Years of education: Not on file   Highest education level: Not on file  Occupational History   Not on file  Social Needs   Financial resource strain: Not on file   Food insecurity:    Worry: Not on file    Inability: Not on file   Transportation needs:    Medical: Not on file    Non-medical: Not on file  Tobacco Use   Smoking status: Former Smoker    Packs/day: 1.00    Years: 40.00    Pack years: 40.00  Types: Cigarettes    Last attempt to quit: 02/15/1981    Years since quitting: 37.2   Smokeless tobacco: Never Used  Substance and Sexual Activity   Alcohol use: Yes   Drug use: No   Sexual activity: Not Currently  Lifestyle   Physical activity:    Days per week: Not on file    Minutes per session: Not on file   Stress: Not on file  Relationships   Social connections:    Talks on phone: Not on file    Gets together: Not on file    Attends religious service: Not on file    Active member of club or organization: Not on file    Attends meetings of clubs or organizations: Not on file    Relationship status: Not on file   Intimate partner violence:    Fear of current or ex partner: Not on file    Emotionally abused: Not on  file    Physically abused: Not on file    Forced sexual activity: Not on file  Other Topics Concern   Not on file  Social History Narrative   Lives at [facility]    Allergies  Allergen Reactions   Sulfa Antibiotics Shortness Of Breath and Palpitations   Sulfamethoxazole Shortness Of Breath and Palpitations   Tape Other (See Comments)    SKIN IS VERY FRAGILE- PLEASE USE AN ALTERNATIVE TO TAPE, AS THE SKIN TEARS EASILY!!   Amoxicillin Rash   Penicillin G Rash    Did it involve swelling of the face/tongue/throat, SOB, or low BP? Yes Did it involve sudden or severe rash/hives, skin peeling, or any reaction on the inside of your mouth or nose? Unk Did you need to seek medical attention at a hospital or doctor's office? Unk When did it last happen? "it was a long time ago" If all above answers are NO, may proceed with cephalosporin use.    Penicillins Rash    Did it involve swelling of the face/tongue/throat, SOB, or low BP? Yes Did it involve sudden or severe rash/hives, skin peeling, or any reaction on the inside of your mouth or nose? Unk Did you need to seek medical attention at a hospital or doctor's office? Unk When did it last happen? "it was a long time ago" If all above answers are NO, may proceed with cephalosporin use.     Family History  Problem Relation Age of Onset   Heart failure Mother    Heart disease Mother    Heart failure Father    Heart disease Father    Heart disease Brother    Asthma Paternal Grandmother     Prior to Admission medications   Medication Sig Start Date End Date Taking? Authorizing Provider  apixaban (ELIQUIS) 2.5 MG TABS tablet Take 1 tablet (2.5 mg total) by mouth 2 (two) times daily. 03/21/18  Yes Elgergawy, Silver Huguenin, MD  diltiazem (CARDIZEM CD) 120 MG 24 hr capsule Take 1 capsule (120 mg total) by mouth daily. 03/22/18  Yes Elgergawy, Silver Huguenin, MD  doxycycline (DORYX) 100 MG EC tablet Take 100 mg by mouth 2 (two)  times daily.   Yes [provider]  fluticasone (FLONASE) 50 MCG/ACT nasal spray Place 2 sprays into the nose daily.   Yes [provider]  Fluticasone-Salmeterol (ADVAIR) 250-50 MCG/DOSE AEPB Inhale 1 puff into the lungs every 12 (twelve) hours.   Yes [provider]  furosemide (LASIX) 20 MG tablet Take 20 mg by mouth  daily.   Yes [provider]  ipratropium-albuterol (DUONEB) 0.5-2.5 (3) MG/3ML SOLN Take 3 mLs by nebulization every 6 (six) hours as needed (WHEEZING). 03/21/18  Yes Elgergawy, Silver Huguenin, MD  ipratropium-albuterol (DUONEB) 0.5-2.5 (3) MG/3ML SOLN Take 3 mLs by nebulization every 8 (eight) hours.   Yes [provider]  levothyroxine (SYNTHROID, LEVOTHROID) 88 MCG tablet Take 1 tablet (88 mcg total) by mouth daily before breakfast. 03/20/12  Yes Lavone Orn, MD  metoprolol tartrate (LOPRESSOR) 25 MG tablet Take 12.5 mg by mouth 2 (two) times daily.   Yes [provider]  Multiple Vitamins-Minerals (CENTRUM SILVER PO) Take 1 tablet by mouth daily.   Yes [provider]  Multiple Vitamins-Minerals (SYSTANE ICAPS AREDS2) CAPS Take 1 capsule by mouth daily.    Yes [provider]  OXYGEN Inhale 2 L into the lungs continuous.   Yes [provider]  pantoprazole (PROTONIX) 40 MG tablet Take 40 mg by mouth daily.  12/10/17  Yes [provider]  saccharomyces boulardii (FLORASTOR) 250 MG capsule Take 250 mg by mouth 2 (two) times daily.   Yes [provider]  tiotropium (SPIRIVA) 18 MCG inhalation capsule Place 18 mcg into inhaler and inhale daily.   Yes [provider]  torsemide (DEMADEX) 20 MG tablet Take 20 mg by mouth daily.   Yes [provider]  zinc oxide 20 % ointment Apply 1 application topically as needed for irritation. Apply to buttocks after every incontinent episode and as needed for redness   Yes [provider]    Physical Exam: Vitals:   04/27/18  1300 04/27/18 1330 04/27/18 1430 04/27/18 1505  BP: 126/86 (!) 129/93 (!) 132/92 129/84  Pulse: (!) 126 (!) 113 (!) 115 (!) 130  Resp: 17 19 (!) 26 16  Temp:    97.7 F (36.5 C)  TempSrc:    Oral  SpO2: 96% 91% 95% 93%  Weight:    49.1 kg  Height:    4\' 10"  (1.473 m)      General:  Appears calm and comfortable and is NAD  Eyes:  PERRL, EOMI, normal lids, iris  ENT:  grossly normal hearing, lips & tongue, mmm; appropriate dentition  Neck:  no LAD, masses or thyromegaly  Cardiovascular:  Irregularly irregular, rate about 110, no m/r/g. 2-3+ pitting LE edema.   Respiratory:   CTA bilaterally with no wheezes/rales/rhonchi.  Normal respiratory effort.  Abdomen:  soft, NT, ND, NABS  Back:   normal alignment, no CVAT  Skin:  no rash or induration seen on limited exam  Musculoskeletal:  grossly normal tone BUE/BLE, good ROM, no bony abnormality  Psychiatric:  grossly normal mood and affect, speech fluent and appropriate, AOx3  Neurologic:  CN 2-12 grossly intact, moves all extremities in coordinated fashion, sensation intact    Radiological Exams on Admission: Dg Chest Portable 1 View  Result Date: 04/27/2018 CLINICAL DATA:  Shortness of breath. EXAM: PORTABLE CHEST 1 VIEW COMPARISON:  Radiograph March 17, 2018. FINDINGS: Stable cardiomegaly. Atherosclerosis of thoracic aorta is noted. No pneumothorax is noted. Interval development of mild to moderate bilateral pleural effusions are noted. Bilateral interstitial densities are noted which may represent edema. Bony thorax is unremarkable. IMPRESSION: Stable cardiomegaly with probable bilateral pulmonary edema and interval development of mild to moderate bilateral pleural effusions. Electronically Signed   By: Marijo Conception, M.D.   On: 04/27/2018 13:35    EKG: Independently reviewed.  Afib with rate 106; nonspecific ST changes with no  evidence of acute ischemia; NSCSLT   Labs on Admission: I have personally reviewed the  available labs and imaging studies at the time of the admission.  Pertinent labs:   Glucose 121 BUN 23/Creatinine 1.23/GFR 36 - generally stable Albumin 3.0 BNP 289.4; 626.2 on 1/31 Troponin <0.03 WBC 11.0 Hgb 10.2  Assessment/Plan Principal Problem:   Acute on chronic diastolic (congestive) heart failure (HCC) Active Problems:   COPD (chronic obstructive pulmonary disease) (HCC)   Hypothyroidism   Hypertension   New onset atrial fibrillation (HCC)   CKD (chronic kidney disease) stage 3, GFR 30-59 ml/min (HCC)   Acute on chronic diastolic CHF -Patient recently admitted for respiratory compromise thought to be due to a combination of CAP and acute on chronic diastolic CHF -She has been on Lasix but continues to have persistent SOB, edema, and weight gain -She denies other respiratory symptoms or fever -Symptoms are more c/w CHF at this time -Normal WBC count, no fever; will not give additional antibiotics at this time (stop home doxy, as she has not shown improvement since starting this medication) -Elevated BNP but lower than on prior admission -Persistent afib on EKG/telemetry -CXR with pulmonary edema -Will admit with telemetry -Recent echocardiogram with preserved EF and grade 2 diastolic dysfunction -Will start ASA -No ACE due to CKD -Continue home beta blocker -CHF order set utilized -Cardiology consult - has been trying to get an outpatient appointment with Dr. Pernell Dupre and would like to be seen as an inpatient -Was given Lasix 80 mg x 1 in ER and will repeat with 80 mg BID for now -Continue [place] O2 for now -Stable kidney function at this time, will follow -Repeat EKG in AM  Afib -Patient with new diagnosis of afib during recent hospitalization -She has mild RVR at this time, but also was not given her medications this AM -Will resume home medications for now -Continue Eliquis - we briefly discussed risks/benefits and her primary objective is stroke avoidance and  she is willing to risk bleeding   HTN -Continue Cardizem, Lopressor  CKD stage 3 -Appears to be at/near baseline at this time -Will follow, particularly given increasing dosing need for diuretics  COPD -Continue Duonebs and Dulera -She appears to be on both standing and prn Duonebs and also Spiriva - will hold Spiriva and consider discontinuation  Hypothyroidism -Normal TSH on 2/1 -Continue Synthroid at current dose for now    DVT prophylaxis: Lovenox  Code Status:  DNR - confirmed with patient/family Family Communication: Great-niece/POA and another family member was present throughout evaluation Disposition Plan:  Back to SNF once clinically improved Consults called: Cardiology; PT/OT Admission status: Admit - It is my clinical opinion that admission to INPATIENT is reasonable and necessary because this patient will require at least 2 midnights in the hospital to treat this condition based on the medical complexity of the problems presented.  Given the aforementioned information, the predictability of an adverse outcome is felt to be significant.     Karmen Bongo MD [facility]   How to contact the [facility] Attending or Consulting provider [place] or covering provider during after hours [place], for this patient?  1. Check the care team in [facility] and look for a) attending/consulting TR[facility] provider listed and b) the [facility] team listed 2. Log into [email or web address] and use [place]'s universal password to access. If you do not have the password, please contact the hospital operator. 3. Locate the [facility] provider you are looking for under  [facility] and page to a number that you can be directly reached. 4. If you still have difficulty reaching the provider, please page the [facility] (Director on Call) for the Hospitalists listed on amion for assistance.   04/27/2018, 3:11 PM

## 2018-04-27 NOTE — ED Provider Notes (Signed)
Katherine [name] EMERGENCY DEPARTMENT Provider Note   CSN: [id number] Arrival date & time: 04/27/18  1105    History   Chief Complaint Chief Complaint  Patient presents with  . Shortness of Breath  . Atrial Fibrillation   Level 5 caveat: Respiratory distress  HPI Katherine [name] is a 85 y.o. female.     HPI Patient is a 85 year old female with a history of oxygen dependent COPD who presents the emergency department worsening shortness of breath over the past 24 hours.  She is currently on an antibiotic and yesterday was given a new diagnosis of atrial fibrillation.  She was thought to be in heart failure yesterday as well and had her Lasix discontinued and was started on torsemide 20 mg.  Plan was for outpatient cardiology follow-up with the patient developed progressive shortness of breath and hypoxia today.  Patient does not want CPR.  She does not want to be intubated.  She is agreeable to BiPAP.  She presents in respiratory distress with increased work of breathing and only able to speak in very short sentences.  Additional information was obtained from the patient's family members.  No reports of fever or productive cough  Past Medical History:  Diagnosis Date  . Arthritis   . Constipation   . COPD (chronic obstructive pulmonary disease) ([place])   . Fatigue   . Frequent UTI   . GERD (gastroesophageal reflux disease)   . History of shingles 2007  . Hyperlipemia   . Hypertension   . Hypothyroidism   . Multiple allergies   . Osteoporosis   . Paget's carcinoma of the nipple ([place]) 03/16/2013  . Pneumonia   . Seasonal allergies   . Stroke [facility]) 2010   no residual  . Thyroid disease   . Uterine cancer [facility])     Patient Active Problem List   Diagnosis Date Noted  . Diastolic CHF ([place]) 76/54/6503  . Fatigue 04/25/2018  . Edema 04/03/2018  . Acute on chronic respiratory failure with hypoxia ([place]) 03/17/2018  . New onset atrial fibrillation ([place]) 03/17/2018  .  Volume overload 03/17/2018  . Chronic anemia 03/17/2018  . CKD (chronic kidney disease) stage 3, GFR 30-59 ml/min (HCC) 03/17/2018  . Breast cancer ([place]) 03/16/2013  . Paget's disease and intraductal carcinoma of left breast ([place]) 03/16/2013  . Aftercare following surgery of the circulatory system, [place] 01/03/2013  . COPD (chronic obstructive pulmonary disease) with acute bronchitis ([place]) 03/16/2012  . Altered mental status 03/16/2012  . Occlusion and stenosis of carotid artery without mention of cerebral infarction 01/06/2012  . Bronchiectasis without acute exacerbation ([place]) 08/20/2011  . Atypical mycobacterial disease 08/20/2011  . Hypothyroidism 07/24/2011  . Hyperlipidemia 07/24/2011  . Hypertension 07/24/2011  . CAP (community acquired pneumonia) 07/23/2011  . COPD (chronic obstructive pulmonary disease) ([place]) 07/23/2011  . Hemoptysis 07/23/2011    Past Surgical History:  Procedure Laterality Date  . ABDOMINAL HYSTERECTOMY    . BREAST LUMPECTOMY     left breast  . CAROTID ENDARTERECTOMY Left June 10, 2008   CE  . EYE SURGERY     cataracts removed bilaterally  . TONSILLECTOMY       OB History   No obstetric history on file.      Home Medications    Prior to Admission medications   Medication Sig Start Date End Date Taking? Authorizing Provider  apixaban (ELIQUIS) 2.5 MG TABS tablet Take 1 tablet (2.5 mg total) by mouth 2 (two) times daily. 03/21/18  Yes Elgergawy, Silver Huguenin, MD  diltiazem (CARDIZEM CD) 120 MG 24 hr capsule Take 1 capsule (120 mg total) by mouth daily. 03/22/18  Yes Elgergawy, Silver Huguenin, MD  doxycycline (DORYX) 100 MG EC tablet Take 100 mg by mouth 2 (two) times daily.   Yes [provider]  fluticasone (FLONASE) 50 MCG/ACT nasal spray Place 2 sprays into the nose daily.   Yes [provider]  Fluticasone-Salmeterol (ADVAIR) 250-50 MCG/DOSE AEPB Inhale 1 puff into the lungs every 12 (twelve) hours.   Yes [provider]   furosemide (LASIX) 20 MG tablet Take 20 mg by mouth daily.   Yes [provider]  ipratropium-albuterol (DUONEB) 0.5-2.5 (3) MG/3ML SOLN Take 3 mLs by nebulization every 6 (six) hours as needed (WHEEZING). 03/21/18  Yes Elgergawy, Silver Huguenin, MD  ipratropium-albuterol (DUONEB) 0.5-2.5 (3) MG/3ML SOLN Take 3 mLs by nebulization every 8 (eight) hours.   Yes [provider]  levothyroxine (SYNTHROID, LEVOTHROID) 88 MCG tablet Take 1 tablet (88 mcg total) by mouth daily before breakfast. 03/20/12  Yes Lavone Orn, MD  metoprolol tartrate (LOPRESSOR) 25 MG tablet Take 12.5 mg by mouth 2 (two) times daily.   Yes [provider]  Multiple Vitamins-Minerals (CENTRUM SILVER PO) Take 1 tablet by mouth daily.   Yes [provider]  Multiple Vitamins-Minerals (SYSTANE ICAPS AREDS2) CAPS Take 1 capsule by mouth daily.    Yes [provider]  OXYGEN Inhale 2 L into the lungs continuous.   Yes [provider]  pantoprazole (PROTONIX) 40 MG tablet Take 40 mg by mouth daily.  12/10/17  Yes [provider]  saccharomyces boulardii (FLORASTOR) 250 MG capsule Take 250 mg by mouth 2 (two) times daily.   Yes [provider]  tiotropium (SPIRIVA) 18 MCG inhalation capsule Place 18 mcg into inhaler and inhale daily.   Yes [provider]  torsemide (DEMADEX) 20 MG tablet Take 20 mg by mouth daily.   Yes [provider]  zinc oxide 20 % ointment Apply 1 application topically as needed for irritation. Apply to buttocks after every incontinent episode and as needed for redness   Yes [provider]    Family History Family History  Problem Relation Age of Onset  . Heart failure Mother   . Heart disease Mother   . Heart failure Father   . Heart disease Father   . Heart disease Brother   . Asthma Paternal Grandmother     Social History Social History   Tobacco Use  . Smoking status: Former Smoker    Packs/day: 1.00     Years: 40.00    Pack years: 40.00    Types: Cigarettes    Last attempt to quit: 02/15/1981    Years since quitting: 37.2  . Smokeless tobacco: Never Used  Substance Use Topics  . Alcohol use: Yes  . Drug use: No     Allergies   Sulfa antibiotics; Sulfamethoxazole; Tape; Amoxicillin; Penicillin g; and Penicillins   Review of Systems Review of Systems  Unable to perform ROS: Severe respiratory distress     Physical Exam Updated Vital Signs BP 123/78   Pulse 91   Temp 98.6 F (37 C) (Rectal)   Resp (!) 24   Ht 4\' 10"  (1.473 m)   Wt 48.1 kg   SpO2 97%   BMI 22.15 kg/m   Physical Exam Vitals signs and nursing note reviewed.  Constitutional:      General: She is not in acute distress.  Appearance: She is well-developed.  HENT:     Head: Normocephalic and atraumatic.  Neck:     Musculoskeletal: Normal range of motion.  Cardiovascular:     Rate and Rhythm: Tachycardia present. Rhythm irregular.     Heart sounds: Normal heart sounds.  Pulmonary:     Effort: Pulmonary effort is normal.     Breath sounds: Rales present. No wheezing or rhonchi.  Abdominal:     General: There is no distension.     Palpations: Abdomen is soft.     Tenderness: There is no abdominal tenderness.  Musculoskeletal: Normal range of motion.  Skin:    General: Skin is warm and dry.  Neurological:     Mental Status: She is alert and oriented to person, place, and time.  Psychiatric:        Judgment: Judgment normal.      ED Treatments / Results  Labs (all labs ordered are listed, but only abnormal results are displayed) Labs Reviewed  CBC - Abnormal; Notable for the following components:      Result Value   WBC 11.0 (*)    RBC 3.41 (*)    Hemoglobin 10.2 (*)    HCT 33.1 (*)    All other components within normal limits  COMPREHENSIVE METABOLIC PANEL - Abnormal; Notable for the following components:   Sodium 132 (*)    Glucose, Bld 121 (*)    Creatinine, Ser 1.23 (*)    Calcium  8.8 (*)    Total Protein 5.8 (*)    Albumin 3.0 (*)    GFR calc non Af Amer 36 (*)    GFR calc Af Amer 42 (*)    All other components within normal limits  BRAIN NATRIURETIC PEPTIDE - Abnormal; Notable for the following components:   B Natriuretic Peptide 289.4 (*)    All other components within normal limits  CULTURE, BLOOD (ROUTINE X 2)  CULTURE, BLOOD (ROUTINE X 2)  TROPONIN I  INFLUENZA PANEL BY PCR (TYPE A & B)    EKG EKG Interpretation  Date/Time:  Thursday April 27 2018 11:33:04 EDT Ventricular Rate:  106 PR Interval:    QRS Duration: 77 QT Interval:  340 QTC Calculation: 452 R Axis:   56 Text Interpretation:  Atrial fibrillation Low voltage, precordial leads Borderline T abnormalities, anterior leads No significant change was found Confirmed by Jola Schmidt [phone]) on 04/27/2018 1:17:10 PM   Radiology No results found.  Procedures .Critical Care Performed by: Jola Schmidt, MD Authorized by: Jola Schmidt, MD   Critical care provider statement:    Critical care time (minutes):  35   Critical care was time spent personally by me on the following activities:  Discussions with consultants, evaluation of patient's response to treatment, examination of patient, ordering and performing treatments and interventions, ordering and review of laboratory studies, ordering and review of radiographic studies, pulse oximetry, re-evaluation of patient's condition, obtaining history from patient or surrogate and review of old charts   (including critical care time)  Medications Ordered in ED Medications - No data to display   Initial Impression / Assessment and Plan / ED Course  I have reviewed the triage vital signs and the nursing notes.  Pertinent labs & imaging results that were available during my care of the patient were reviewed by me and considered in my medical decision making (see chart for details).        Patient placed on BiPAP on arrival to the emergency  department.  She is found to be in congestive heart failure.  Increasing pulmonary edema and bilateral pleural effusions.  Patient tolerating BiPAP well.  Improving.  IV Lasix now.  Admission to the hospital.  New onset A. fib.  This can be further addressed in the hospital  Final Clinical Impressions(s) / ED Diagnoses   Final diagnoses:  Acute respiratory failure with hypoxia (HCC)  Acute congestive heart failure, unspecified heart failure type [facility])  Atrial fibrillation, unspecified type [facility])    ED Discharge Orders    None       Jola Schmidt, MD 04/27/18 1358

## 2018-04-27 NOTE — ED Notes (Addendum)
ED TO INPATIENT HANDOFF REPORT  ED Nurse Name and Phone #: Joellen Jersey [phone]  S Name/Age/Gender Katherine [name] 85 y.o. female Room/Bed: 023C/023C  Code Status   Code Status: Prior  Home/SNF/Other Nursing Home Patient oriented to: self, place, time and situation Is this baseline? Yes   Triage Complete: Triage complete  Chief Complaint SOB/AFIB  Triage Note Per [facility] pt coming from [place] assisted living with c/o shortness of breath onset this morning with new onset of a-fib. EMS reports initial sats of 90% and HR 130-160. Given 5mg  metoprolol and HR went to 80-120.    Allergies Allergies  Allergen Reactions  . Sulfa Antibiotics Shortness Of Breath and Palpitations  . Sulfamethoxazole Shortness Of Breath and Palpitations  . Tape Other (See Comments)    SKIN IS VERY FRAGILE- PLEASE USE AN ALTERNATIVE TO TAPE, AS THE SKIN TEARS EASILY!!  . Amoxicillin Rash  . Penicillin G Rash    Did it involve swelling of the face/tongue/throat, SOB, or low BP? Yes Did it involve sudden or severe rash/hives, skin peeling, or any reaction on the inside of your mouth or nose? Unk Did you need to seek medical attention at a hospital or doctor's office? Unk When did it last happen? "it was a long time ago" If all above answers are "NO", may proceed with cephalosporin use.   Marland Kitchen Penicillins Rash    Did it involve swelling of the face/tongue/throat, SOB, or low BP? Yes Did it involve sudden or severe rash/hives, skin peeling, or any reaction on the inside of your mouth or nose? Unk Did you need to seek medical attention at a hospital or doctor's office? Unk When did it last happen? "it was a long time ago" If all above answers are "NO", may proceed with cephalosporin use.     Level of Care/Admitting Diagnosis ED Disposition    ED Disposition Condition Comment   Admit  Hospital Area: [place] [[id number]]  Level of Care: Telemetry Cardiac [[id number]]  Diagnosis:  Acute on chronic diastolic (congestive) heart failure [facility]) [[id number]]  Admitting Physician: Karmen Bongo [[id number]]  Attending Physician: Karmen Bongo [[id number]]  Estimated length of stay: past midnight tomorrow  Certification:: I certify this patient will need inpatient services for at least 2 midnights  PT Class (Do Not Modify): Inpatient [101]  PT Acc Code (Do Not Modify): Private [1]       B Medical/Surgery History Past Medical History:  Diagnosis Date  . Arthritis   . Constipation   . COPD (chronic obstructive pulmonary disease) ([place])   . Fatigue   . Frequent UTI   . GERD (gastroesophageal reflux disease)   . History of shingles 2007  . Hyperlipemia   . Hypertension   . Hypothyroidism   . Multiple allergies   . Osteoporosis   . Paget's carcinoma of the nipple ([place]) 03/16/2013  . Pneumonia   . Seasonal allergies   . Stroke [facility]) 2010   no residual  . Thyroid disease   . Uterine cancer [facility])    Past Surgical History:  Procedure Laterality Date  . ABDOMINAL HYSTERECTOMY    . BREAST LUMPECTOMY     left breast  . CAROTID ENDARTERECTOMY Left June 10, 2008   CE  . EYE SURGERY     cataracts removed bilaterally  . TONSILLECTOMY       A IV Location/Drains/Wounds Patient Lines/Drains/Airways Status   Active Line/Drains/Airways    Name:   Placement date:   Placement time:  Site:   Days:   Peripheral IV 04/27/18 Left Antecubital   04/27/18    1113    Antecubital   less than 1   External Urinary Catheter   03/18/18    -    -   40          Intake/Output Last 24 hours No intake or output data in the 24 hours ending 04/27/18 1421  Labs/Imaging Results for orders placed or performed during the hospital encounter of 04/27/18 (from the past 48 hour(s))  CBC     Status: Abnormal   Collection Time: 04/27/18 11:23 AM  Result Value Ref Range   WBC 11.0 (H) 4.0 - 10.5 K/uL   RBC 3.41 (L) 3.87 - 5.11 MIL/uL   Hemoglobin 10.2 (L) 12.0 - 15.0 g/dL   HCT 33.1 (L) 36.0 -  46.0 %   MCV 97.1 80.0 - 100.0 fL   MCH 29.9 26.0 - 34.0 pg   MCHC 30.8 30.0 - 36.0 g/dL   RDW 14.6 11.5 - 15.5 %   Platelets 360 150 - 400 K/uL   nRBC 0.0 0.0 - 0.2 %    Comment: Performed at [facility], [place] [address], [place], [place] [address]  Comprehensive metabolic panel     Status: Abnormal   Collection Time: 04/27/18 11:23 AM  Result Value Ref Range   Sodium 132 (L) 135 - 145 mmol/L   Potassium 4.4 3.5 - 5.1 mmol/L   Chloride 98 98 - 111 mmol/L   CO2 24 22 - 32 mmol/L   Glucose, Bld 121 (H) 70 - 99 mg/dL   BUN 18 8 - 23 mg/dL   Creatinine, Ser 1.23 (H) 0.44 - 1.00 mg/dL   Calcium 8.8 (L) 8.9 - 10.3 mg/dL   Total Protein 5.8 (L) 6.5 - 8.1 g/dL   Albumin 3.0 (L) 3.5 - 5.0 g/dL   AST 26 15 - 41 U/L   ALT 16 0 - 44 U/L   Alkaline Phosphatase 66 38 - 126 U/L   Total Bilirubin 0.6 0.3 - 1.2 mg/dL   GFR calc non Af Amer 36 (L) >60 mL/min   GFR calc Af Amer 42 (L) >60 mL/min   Anion gap 10 5 - 15    Comment: Performed at [facility], [place] [address] [place], [place] [address]  Troponin I - ONCE - STAT     Status: None   Collection Time: 04/27/18 11:23 AM  Result Value Ref Range   Troponin I <0.03 <0.03 ng/mL    Comment: Performed at [facility] [address] [place], [place] [address]  Brain natriuretic peptide     Status: Abnormal   Collection Time: 04/27/18 11:23 AM  Result Value Ref Range   B Natriuretic Peptide 289.4 (H) 0.0 - 100.0 pg/mL    Comment: Performed at [facility] [address], [place], [place] [address]   Dg Chest Portable 1 View  Result Date: 04/27/2018 CLINICAL DATA:  Shortness of breath. EXAM: PORTABLE CHEST 1 VIEW COMPARISON:  Radiograph March 17, 2018. FINDINGS: Stable cardiomegaly. Atherosclerosis of thoracic aorta is noted. No pneumothorax is noted. Interval development of mild to moderate bilateral pleural effusions are noted. Bilateral interstitial densities are noted which may represent edema. Bony thorax is  unremarkable. IMPRESSION: Stable cardiomegaly with probable bilateral pulmonary edema and interval development of mild to moderate bilateral pleural effusions. Electronically Signed   By: Marijo Conception, M.D.   On: 04/27/2018  13:35    Pending Labs Unresulted Labs (From admission, onward)    Start     Ordered   04/27/18 1123  Blood culture (routine x 2)  BLOOD CULTURE X 2,   STAT     04/27/18 1123   04/27/18 1123  Influenza panel by PCR (type A & B)  (Influenza PCR Panel)  Once,   R     04/27/18 1123          Vitals/Pain Today's Vitals   04/27/18 1200 04/27/18 1230 04/27/18 1300 04/27/18 1330  BP: 123/78 117/86 126/86 (!) 129/93  Pulse: 91 (!) 107 (!) 126 (!) 113  Resp: (!) 24 (!) 25 17 19   Temp:      TempSrc:      SpO2: 97% 95% 96% 91%  Weight:      Height:      PainSc:        Isolation Precautions Droplet precaution  Medications Medications  furosemide (LASIX) injection 80 mg (has no administration in time range)    Mobility walks with device High fall risk   Focused Assessments Pulmonary Assessment Handoff:  Lung sounds: Bilateral Breath Sounds: Diminished L Breath Sounds: Diminished R Breath Sounds: Diminished O2 Device: Bi-PAP O2 Flow Rate (L/min): 5 L/min      R Recommendations: See Admitting Provider Note  Report given to:   Additional Notes:

## 2018-04-27 NOTE — Consult Note (Signed)
Cardiology Consult    Patient ID: Katherine [name] MRN: [id number], DOB/AGE: June 10, 1917   Admit date: 04/27/2018 Date of Consult: 04/27/2018  Primary Physician: [name], MD Primary Cardiologist: [facility] (Dr. Debara Pickett) Requesting Provider: Karmen Bongo, MD  Patient Profile    Katherine [name] is a 85 y.o. female with a history of uterine cancer, Paget's carcinoma of the nipple, CVA, hypertension, hyperlipidemia, carotid artery disease s/p left carotid endarterectomy in 2010, COPD currently on 2L of home O2, and hypothyroidism, who is being seen today for the evaluation of CHF at the request of Dr. Lorin Mercy.  History of Present Illness    Katherine [name] is a 85 year old female with the above history. Patient has never seen a Cardiologist or had any type of ischemic workup. She was recently admitted from 03/17/2018 to 03/21/2018 for shortness of breath and was diagnosed with community acquired pneumonia and acute diastolic CHF. Echo showed LVEF of 60-65% with grade 2 diastolic dysfunction. Patient was treated with IV antibiotics and IV Lasix. Patient was also found to have new onset atrial fibrillation but converted back to sinus rhythm before discharge. She was discharged on Cardizem 120mg  daily and Eliquis. Before this hospitalization she was on supplemental O2 at night; however, she was discharged on O2 24/7. Since being discharged, patient has been trying to get an appointment with our office but has been unable to schedule one yet.  Patient [facility] to the [facility] ED via EMS today from her assisted living facility for evaluation of shortness of breath. Patient reports she has had continued shortness of breath since she was discharged that has just gotten progressively worse. She reports orthopnea and states she has not been able to sleep much lately due to difficulty breathing. She also notes worsening lower extremity swelling. Patient was reportedly restarted on antibiotics earlier this week due  to concern for continued pneumonia; however, I do not see this on her home med list. She was also thought to be in heart failure yesterday so her Lasix 20mg  daily was discontinued and she was prescribed Torsemide 20mg  daily. However, family called the assisted living facility while I was in the room and they said she has not been taking Torsemide. No chest pain, palpitations, lightheadedness, dizziness, or vision changes. She has some nasal congestion and a non-productive cough but denies any fevers, chills, or body aches. No sick contacts.   Upon arrival to the ED, mildly tachypneic and tachycardic. EKG showed atrial fibrillation with rate of 106 bpm. Initial troponin negative. BNP elevated at 289.4 (was 626.2 on 03/17/2018). Chest x-ray showed stable cardiomegaly with probable bilateral pulmonary edema and interval development of mild to moderate bilateral pleural effusion. WBC 11.0, Hgb 10.2, Plts 360. Na 132, K 4.4, Glucose 121, SCr 1.23. Patient was started on BiPAP in the ED and given Lasix with improvement She was admitted for further evaluation and management of CHF.  At the time of this evaluation, patient no longer on BiPAP. She states she is feeling much better and states her breathing and lower extremity edema has improved with the Lasix.   Patient denies any history of tobacco use. She does have a family history of heart failure in both her mother and father. Her brother also died suddenly in his 71's from unknown cause.  Past Medical History   Past Medical History:  Diagnosis Date   Arthritis    Constipation    COPD (chronic obstructive pulmonary disease) ([place])  Fatigue    Frequent UTI    GERD (gastroesophageal reflux disease)    History of shingles 2007   Hyperlipemia    Hypertension    Hypothyroidism    Multiple allergies    Osteoporosis    Paget's carcinoma of the nipple ([place]) 03/16/2013   Pneumonia    Seasonal allergies    Stroke ([place]) 2010   no residual     Thyroid disease    Uterine cancer [facility])     Past Surgical History:  Procedure Laterality Date   ABDOMINAL HYSTERECTOMY     BREAST LUMPECTOMY     left breast   CAROTID ENDARTERECTOMY Left June 10, 2008   CE   EYE SURGERY     cataracts removed bilaterally   TONSILLECTOMY       Allergies  Allergies  Allergen Reactions   Sulfa Antibiotics Shortness Of Breath and Palpitations   Sulfamethoxazole Shortness Of Breath and Palpitations   Tape Other (See Comments)    SKIN IS VERY FRAGILE- PLEASE USE AN ALTERNATIVE TO TAPE, AS THE SKIN TEARS EASILY!!   Amoxicillin Rash   Penicillin G Rash    Did it involve swelling of the face/tongue/throat, SOB, or low BP? Yes Did it involve sudden or severe rash/hives, skin peeling, or any reaction on the inside of your mouth or nose? Unk Did you need to seek medical attention at a hospital or doctor's office? Unk When did it last happen? "it was a long time ago" If all above answers are NO, may proceed with cephalosporin use.    Penicillins Rash    Did it involve swelling of the face/tongue/throat, SOB, or low BP? Yes Did it involve sudden or severe rash/hives, skin peeling, or any reaction on the inside of your mouth or nose? Unk Did you need to seek medical attention at a hospital or doctor's office? Unk When did it last happen? "it was a long time ago" If all above answers are NO, may proceed with cephalosporin use.     Inpatient Medications     apixaban  2.5 mg Oral BID   [START ON 04/28/2018] aspirin EC  81 mg Oral Daily   diltiazem  120 mg Oral Daily   [START ON 04/28/2018] fluticasone  2 spray Each Nare Daily   furosemide  80 mg Intravenous Q12H   ipratropium-albuterol  3 mL Nebulization Q8H   [START ON 04/28/2018] levothyroxine  88 mcg Oral QAC breakfast   metoprolol tartrate  12.5 mg Oral BID   mometasone-formoterol  2 puff Inhalation BID   [START ON 04/28/2018] pantoprazole  40 mg Oral Daily   sodium  chloride flush  3 mL Intravenous Q12H    Family History    Family History  Problem Relation Age of Onset   Heart failure Mother    Heart disease Mother    Heart failure Father    Heart disease Father    Heart disease Brother    Asthma Paternal Grandmother    She indicated that her mother is deceased. She indicated that her father is deceased. She indicated that both of her brothers are deceased. She indicated that the status of her paternal grandmother is unknown.   Social History    Social History   Socioeconomic History   Marital status: Widowed    Spouse name: Not on file   Number of children: Not on file   Years of education: Not on file   Highest education level: Not on file  Occupational History  Not on file  Social Needs   Financial resource strain: Not on file   Food insecurity:    Worry: Not on file    Inability: Not on file   Transportation needs:    Medical: Not on file    Non-medical: Not on file  Tobacco Use   Smoking status: Former Smoker    Packs/day: 1.00    Years: 40.00    Pack years: 40.00    Types: Cigarettes    Last attempt to quit: 02/15/1981    Years since quitting: 37.2   Smokeless tobacco: Never Used  Substance and Sexual Activity   Alcohol use: Yes   Drug use: No   Sexual activity: Not Currently  Lifestyle   Physical activity:    Days per week: Not on file    Minutes per session: Not on file   Stress: Not on file  Relationships   Social connections:    Talks on phone: Not on file    Gets together: Not on file    Attends religious service: Not on file    Active member of club or organization: Not on file    Attends meetings of clubs or organizations: Not on file    Relationship status: Not on file   Intimate partner violence:    Fear of current or ex partner: Not on file    Emotionally abused: Not on file    Physically abused: Not on file    Forced sexual activity: Not on file  Other Topics Concern    Not on file  Social History Narrative   Lives at [facility]     Review of Systems    Review of Systems  Constitutional: Negative for chills and fever.  HENT: Positive for congestion.   Eyes: Negative for blurred vision and double vision.  Respiratory: Positive for cough and shortness of breath. Negative for hemoptysis and sputum production.   Cardiovascular: Positive for orthopnea and leg swelling. Negative for chest pain and palpitations.  Gastrointestinal: Negative for blood in stool, nausea and vomiting.  Genitourinary: Negative for hematuria.  Musculoskeletal: Negative for falls and myalgias.  Neurological: Negative for dizziness and loss of consciousness.  Endo/Heme/Allergies: Does not bruise/bleed easily.  Psychiatric/Behavioral: Negative for substance abuse.    Physical Exam    Blood pressure 129/84, pulse (!) 130, temperature 97.7 F (36.5 C), temperature source Oral, resp. rate 16, height 4\' 10"  (1.473 m), weight 48.1 kg, SpO2 93 %.  General: 85 y.o. frail female resting comfortably in no acute distress. Pleasant and cooperative. Currently on 7L of supplemental O2 via nasal cannula. HEENT: Normal  Neck: Supple. No carotid bruits or JVD appreciated. Lungs: No increased work of breathing. Diminished breath sound in bilateral bases but no wheezes, rhonchi, or rales. Heart: Mildly tachycardic with irregularly irregular rhythm. No significant murmurs, gallops, or rubs.  Abdomen: Soft, non-distended, and non-tender to palpation. Bowel sounds present. Extremities: 1+ pitting edema of bilateral lower extremities. Radial 2+ and equal bilaterally.  Skin: Warm and dry. Neuro: Alert and oriented x3. No focal deficits. Moves all extremities spontaneously. Psych: Normal affect. Responds appropriately.   Labs    Troponin (Point of Care Test) No results for input(s): TROPIPOC in the last 72 hours. Recent Labs    04/27/18 1123  TROPONINI <0.03   Lab Results  Component  Value Date   WBC 11.0 (H) 04/27/2018   HGB 10.2 (L) 04/27/2018   HCT 33.1 (L) 04/27/2018   MCV 97.1 04/27/2018  PLT 360 04/27/2018    Recent Labs  Lab 04/27/18 1123  NA 132*  K 4.4  CL 98  CO2 24  BUN 18  CREATININE 1.23*  CALCIUM 8.8*  PROT 5.8*  BILITOT 0.6  ALKPHOS 66  ALT 16  AST 26  GLUCOSE 121*   No results found for: CHOL, HDL, LDLCALC, TRIG No results found for: [facility] [place]   Radiology Studies    Dg Chest Portable 1 View  Result Date: 04/27/2018 CLINICAL DATA:  Shortness of breath. EXAM: PORTABLE CHEST 1 VIEW COMPARISON:  Radiograph March 17, 2018. FINDINGS: Stable cardiomegaly. Atherosclerosis of thoracic aorta is noted. No pneumothorax is noted. Interval development of mild to moderate bilateral pleural effusions are noted. Bilateral interstitial densities are noted which may represent edema. Bony thorax is unremarkable. IMPRESSION: Stable cardiomegaly with probable bilateral pulmonary edema and interval development of mild to moderate bilateral pleural effusions. Electronically Signed   By: Marijo Conception, M.D.   On: 04/27/2018 13:35    EKG     EKG: EKG was personally reviewed and demonstrates: Atrial fibrillation with ventricular rate of 106 bpm but no acute ischemic changes.  Telemetry: Telemetry was personally reviewed and demonstrates: Atrial fibrillation with rates in the 100's to 120's.  Cardiac Imaging    Echocardiogram 03/18/2018: Impressions:  1. Normal left ventricular size and systolic function.  2. Grade 2 diastolic dysfunction with elevated left atrial pressure.  3. Mildly dilated left atrial size.  4. Normal right atrial size.  5. Normal tricuspid valve.  6. Tricuspid regurgitation is mild.  7. The aortic valve tricuspid. There is mild thickening and mild calcification of the aortic valve.  8. The inferior vena cava was dilated in size with >50% respiratory variablity.  9. No atrial level shunt detected by color flow Doppler.  Assessment &  Plan    Acute on Chronic Diastolic CHF - Patient presents with worsening shortness of breath, orthopnea, and PND. - BNP elevated at 289.4 (was 626.6 on recent admission). - Chest x-ray showed stable cardiomegaly with probable bilateral pulmonary edema and interval development of mild to moderate bilateral pleural effusion. - Weight at recent discharge after diuresis was 99 lbs. Weight today 108 lbs. - Patient was given one dose of Lasix 80mg  in the ED with symptom improvement. - Currently has IV Lasix 80mg  twice daily ordered. Will decrease to IV Lasix 40mg  twice daily. - Continue home medications. - Continue to monitor daily weight, strict I/O's, and renal function.  Paroxsymal Atrial Fibrillation - Patient recently diagnosed with new onset atrial fibrillation during recent hospitalization for community acquired pneumonia and acute diastolic CHF. - Patient in atrial fibrillation on arrival. Telemetry shows atrial fibrillation with ventricular rates in the 100's to 120's. - Potassium 4.4.  - Continue home Cardizem 120mg  daily and home Lopressor 12.5mg  twice daily. If rates still uncontrolled, could consider increasing Lopressor to 9m twice daily. - CHA2DS-VASc = 6. (CHF, HTN, TIA, age x2, gender). Continue home anticoagulation with Eliquis 2.5mg  twice daily. - Atrial fibrillation may be contributing to heart failure exacerbation so may need to consider cardioversion.  Hypertension - BP well controlled at 129/84. - Continue home medications.   Hypothyroidism - TSH elevated at 10.029.  - Will check free T4. - Management per primary team.  Otherwise, per primary team.  Signed, Darreld Mclean, PA-C 04/27/2018, 3:06 PM  For questions or updates, please contact   Please consult [email or web address] for contact info under Cardiology/STEMI.

## 2018-04-27 NOTE — Telephone Encounter (Signed)
NOTES ON FILE 

## 2018-04-27 NOTE — ED Notes (Signed)
Respiratory called for [place] per Venora Maples, MD.

## 2018-04-27 NOTE — Progress Notes (Signed)
Pt placed on bipap per MD order.

## 2018-04-27 NOTE — ED Triage Notes (Signed)
Per [facility] pt coming from [place] assisted living with c/o shortness of breath onset this morning with new onset of a-fib. EMS reports initial sats of 90% and HR 130-160. Given 5mg  metoprolol and HR went to 80-120.

## 2018-04-28 ENCOUNTER — Inpatient Hospital Stay (HOSPITAL_COMMUNITY): Payer: Medicare Other

## 2018-04-28 DIAGNOSIS — L89002 Pressure ulcer of unspecified elbow, stage 2: Secondary | ICD-10-CM

## 2018-04-28 DIAGNOSIS — E039 Hypothyroidism, unspecified: Secondary | ICD-10-CM

## 2018-04-28 DIAGNOSIS — L8990 Pressure ulcer of unspecified site, unspecified stage: Secondary | ICD-10-CM

## 2018-04-28 DIAGNOSIS — I480 Paroxysmal atrial fibrillation: Secondary | ICD-10-CM

## 2018-04-28 DIAGNOSIS — E032 Hypothyroidism due to medicaments and other exogenous substances: Secondary | ICD-10-CM

## 2018-04-28 DIAGNOSIS — I4819 Other persistent atrial fibrillation: Secondary | ICD-10-CM

## 2018-04-28 DIAGNOSIS — I10 Essential (primary) hypertension: Secondary | ICD-10-CM

## 2018-04-28 DIAGNOSIS — J441 Chronic obstructive pulmonary disease with (acute) exacerbation: Secondary | ICD-10-CM

## 2018-04-28 LAB — BASIC METABOLIC PANEL
Anion gap: 10 (ref 5–15)
BUN: 18 mg/dL (ref 8–23)
CO2: 29 mmol/L (ref 22–32)
CREATININE: 1.33 mg/dL — AB (ref 0.44–1.00)
Calcium: 9.2 mg/dL (ref 8.9–10.3)
Chloride: 96 mmol/L — ABNORMAL LOW (ref 98–111)
GFR calc Af Amer: 38 mL/min — ABNORMAL LOW (ref >60)
GFR calc non Af Amer: 33 mL/min — ABNORMAL LOW (ref >60)
Glucose, Bld: 103 mg/dL — ABNORMAL HIGH (ref 70–99)
Potassium: 3.6 mmol/L (ref 3.5–5.1)
Sodium: 135 mmol/L (ref 135–145)

## 2018-04-28 LAB — CBC
HCT: 29.7 % — ABNORMAL LOW (ref 36.0–46.0)
Hemoglobin: 9.9 g/dL — ABNORMAL LOW (ref 12.0–15.0)
MCH: 31.3 pg (ref 26.0–34.0)
MCHC: 33.3 g/dL (ref 30.0–36.0)
MCV: 94 fL (ref 80.0–100.0)
Platelets: 346 10*3/uL (ref 150–400)
RBC: 3.16 MIL/uL — ABNORMAL LOW (ref 3.87–5.11)
RDW: 14.4 % (ref 11.5–15.5)
WBC: 9 10*3/uL (ref 4.0–10.5)
nRBC: 0 % (ref 0.0–0.2)

## 2018-04-28 LAB — T4, FREE: Free T4: 1.63 ng/dL (ref 0.82–1.77)

## 2018-04-28 MED ORDER — IPRATROPIUM-ALBUTEROL 0.5-2.5 (3) MG/3ML IN SOLN
3.0000 mL | Freq: Three times a day (TID) | RESPIRATORY_TRACT | Status: DC
  Administered 2018-04-29 – 2018-05-02 (×11): 3 mL via RESPIRATORY_TRACT
  Filled 2018-04-28 (×11): qty 3

## 2018-04-28 MED ORDER — FUROSEMIDE 40 MG PO TABS
40.0000 mg | ORAL_TABLET | Freq: Every day | ORAL | Status: DC
  Administered 2018-04-29 – 2018-04-30 (×2): 40 mg via ORAL
  Filled 2018-04-28 (×2): qty 1

## 2018-04-28 NOTE — Progress Notes (Signed)
DAILY PROGRESS NOTE   Patient Name: Katherine [name] Date of Encounter: 04/28/2018 Cardiologist: No primary care provider on file.  Chief Complaint   Hypoxemia and tachycardia noted overnight  Patient Profile   Katherine [name] is a 85 y.o. female with a history of uterine cancer, Paget's carcinoma of the nipple, CVA, hypertension, hyperlipidemia, carotid artery disease s/p left carotid endarterectomy in 2010, COPD currently on 2L of home O2, and hypothyroidism, who is being seen today for the evaluation of CHF at the request of Dr. Lorin Mercy.  Subjective   Noted hypoxemia and tachycardia overnight with afib, rate improved today - SPO2 in the 90s but now on 8L [place]. Good diuresis overnight, about 1L negative. Hyponatremia has resolved with mild bump up in creatinine. TSH mildly elevated - she is on thyroid medication. Says she is more short of breath today - wheezy. Weight recorded today is not accurate (~20KG gain?).  Objective   Vitals:   04/27/18 2130 04/28/18 0404 04/28/18 0500 04/28/18 0823  BP:  119/64  (!) 107/56  Pulse: 97 92  80  Resp:  18    Temp:  98 F (36.7 C)    TempSrc:  Oral    SpO2: 96% 93%    Weight:   67.1 kg   Height:        Intake/Output Summary (Last 24 hours) at 04/28/2018 0851 Last data filed at 04/28/2018 2841 Gross per 24 hour  Intake 663 ml  Output 1350 ml  Net -687 ml   Filed Weights   04/27/18 1117 04/27/18 1505 04/28/18 0500  Weight: 48.1 kg 49.1 kg 67.1 kg    Physical Exam   General appearance: alert, appears stated age and mild distress Neck: no carotid bruit, no JVD and thyroid not enlarged, symmetric, no tenderness/mass/nodules Lungs: diminished breath sounds bilaterally and wheezes LUL, RUL and expiratory Heart: irregularly irregular rhythm Abdomen: soft, non-tender; bowel sounds normal; no masses,  no organomegaly Extremities: extremities normal, atraumatic, no cyanosis or edema Pulses: 2+ and symmetric Skin: pale, warm, dry Neurologic:  Grossly normal Psych: Pleasant  Inpatient Medications    Scheduled Meds: . apixaban  2.5 mg Oral BID  . aspirin EC  81 mg Oral Daily  . diltiazem  120 mg Oral Daily  . fluticasone  2 spray Each Nare Daily  . furosemide  40 mg Intravenous Q12H  . ipratropium-albuterol  3 mL Nebulization Q8H  . levothyroxine  88 mcg Oral QAC breakfast  . metoprolol tartrate  12.5 mg Oral BID  . mometasone-formoterol  2 puff Inhalation BID  . pantoprazole  40 mg Oral Daily  . sodium chloride flush  3 mL Intravenous Q12H    Continuous Infusions: . sodium chloride      PRN Meds: sodium chloride, acetaminophen, albuterol, ondansetron (ZOFRAN) IV, sodium chloride flush   Labs   Results for orders placed or performed during the hospital encounter of 04/27/18 (from the past 48 hour(s))  CBC     Status: Abnormal   Collection Time: 04/27/18 11:23 AM  Result Value Ref Range   WBC 11.0 (H) 4.0 - 10.5 K/uL   RBC 3.41 (L) 3.87 - 5.11 MIL/uL   Hemoglobin 10.2 (L) 12.0 - 15.0 g/dL   HCT 33.1 (L) 36.0 - 46.0 %   MCV 97.1 80.0 - 100.0 fL   MCH 29.9 26.0 - 34.0 pg   MCHC 30.8 30.0 - 36.0 g/dL   RDW 14.6 11.5 - 15.5 %   Platelets 360 150 - 400  K/uL   nRBC 0.0 0.0 - 0.2 %    Comment: Performed at [facility], [place] [address] [place], [place] [address]  Comprehensive metabolic panel     Status: Abnormal   Collection Time: 04/27/18 11:23 AM  Result Value Ref Range   Sodium 132 (L) 135 - 145 mmol/L   Potassium 4.4 3.5 - 5.1 mmol/L   Chloride 98 98 - 111 mmol/L   CO2 24 22 - 32 mmol/L   Glucose, Bld 121 (H) 70 - 99 mg/dL   BUN 18 8 - 23 mg/dL   Creatinine, Ser 1.23 (H) 0.44 - 1.00 mg/dL   Calcium 8.8 (L) 8.9 - 10.3 mg/dL   Total Protein 5.8 (L) 6.5 - 8.1 g/dL   Albumin 3.0 (L) 3.5 - 5.0 g/dL   AST 26 15 - 41 U/L   ALT 16 0 - 44 U/L   Alkaline Phosphatase 66 38 - 126 U/L   Total Bilirubin 0.6 0.3 - 1.2 mg/dL   GFR calc non Af Amer 36 (L) >60 mL/min   GFR calc Af Amer 42 (L) >60 mL/min    Anion gap 10 5 - 15    Comment: Performed at [facility], [place] [address] [place], [place] [address]  Troponin I - ONCE - STAT     Status: None   Collection Time: 04/27/18 11:23 AM  Result Value Ref Range   Troponin I <0.03 <0.03 ng/mL    Comment: Performed at [facility] [address], [place], [place] [address]  Brain natriuretic peptide     Status: Abnormal   Collection Time: 04/27/18 11:23 AM  Result Value Ref Range   B Natriuretic Peptide 289.4 (H) 0.0 - 100.0 pg/mL    Comment: Performed at [facility] [address], [place], [place] [address]  Influenza panel by PCR (type A & B)     Status: None   Collection Time: 04/27/18  1:12 PM  Result Value Ref Range   Influenza A By PCR NEGATIVE NEGATIVE   Influenza B By PCR NEGATIVE NEGATIVE    Comment: (NOTE) The Xpert Xpress Flu assay is intended as an aid in the diagnosis of  influenza and should not be used as a sole basis for treatment.  This  assay is FDA approved for nasopharyngeal swab specimens only. Nasal  washings and aspirates are unacceptable for Xpert Xpress Flu testing. Performed at [facility], [place] [address], [place], [place] [address]   MRSA PCR Screening     Status: None   Collection Time: 04/27/18  3:03 PM  Result Value Ref Range   MRSA by PCR NEGATIVE NEGATIVE    Comment:        The GeneXpert MRSA Assay (FDA approved for NASAL specimens only), is one component of a comprehensive MRSA colonization surveillance program. It is not intended to diagnose MRSA infection nor to guide or monitor treatment for MRSA infections. Performed at [facility], [place] [address] [place], [place] [address]   TSH     Status: Abnormal   Collection Time: 04/27/18  3:23 PM  Result Value Ref Range   TSH 10.029 (H) 0.350 - 4.500 uIU/mL    Comment: Performed by a 3rd Generation assay with a functional sensitivity of <=0.01 uIU/mL. Performed at [facility], [place] [address]  [place], [place] [address]   Basic metabolic panel     Status: Abnormal   Collection Time: 04/28/18  4:27 AM  Result Value Ref Range   Sodium 135 135 - 145 mmol/L   Potassium 3.6 3.5 - 5.1 mmol/L   Chloride 96 (L) 98 - 111 mmol/L   CO2 29 22 - 32 mmol/L   Glucose, Bld 103 (H) 70 - 99 mg/dL   BUN 18 8 - 23 mg/dL   Creatinine, Ser 1.33 (H) 0.44 - 1.00 mg/dL   Calcium 9.2 8.9 - 10.3 mg/dL   GFR calc non Af Amer 33 (L) >60 mL/min   GFR calc Af Amer 38 (L) >60 mL/min   Anion gap 10 5 - 15    Comment: Performed at [facility] [address] [place], [place] [address]  CBC     Status: Abnormal   Collection Time: 04/28/18  4:27 AM  Result Value Ref Range   WBC 9.0 4.0 - 10.5 K/uL   RBC 3.16 (L) 3.87 - 5.11 MIL/uL   Hemoglobin 9.9 (L) 12.0 - 15.0 g/dL   HCT 29.7 (L) 36.0 - 46.0 %   MCV 94.0 80.0 - 100.0 fL   MCH 31.3 26.0 - 34.0 pg   MCHC 33.3 30.0 - 36.0 g/dL   RDW 14.4 11.5 - 15.5 %   Platelets 346 150 - 400 K/uL   nRBC 0.0 0.0 - 0.2 %    Comment: Performed at [facility], [place] [address], [place], [place] [address]    ECG   Afib with CVR at 84 - Personally Reviewed  Telemetry   Afib - Personally Reviewed  Radiology    Dg Chest Portable 1 View  Result Date: 04/27/2018 CLINICAL DATA:  Shortness of breath. EXAM: PORTABLE CHEST 1 VIEW COMPARISON:  Radiograph March 17, 2018. FINDINGS: Stable cardiomegaly. Atherosclerosis of thoracic aorta is noted. No pneumothorax is noted. Interval development of mild to moderate bilateral pleural effusions are noted. Bilateral interstitial densities are noted which may represent edema. Bony thorax is unremarkable. IMPRESSION: Stable cardiomegaly with probable bilateral pulmonary edema and interval development of mild to moderate bilateral pleural effusions. Electronically Signed   By: Marijo Conception, M.D.   On: 04/27/2018 13:35    Cardiac Studies   N/A  Assessment   1. Principal Problem: 2.   Acute on chronic diastolic  (congestive) heart failure ([place]) 3. Active Problems: 4.   COPD (chronic obstructive pulmonary disease) ([place]) 5.   Hypothyroidism 6.   Hypertension 7.   Atrial fibrillation ([place]) 8.   CKD (chronic kidney disease) stage 3, GFR 30-59 ml/min (HCC) 9.   Pressure injury of skin 10.   Plan   1. Appears adequately diuresed - concern for evolving respiratory process. Appears more dyspneic today - increased AA gradient overnight, wheezy- known COPD. Will get repeat CXR today. ?respiratory virus vs COPD exacerbation. Flu negative - no fever overnight. Has PRN nebs ordered. Hold further lasix today - switch to lasix 40 mg po daily tomorrow. Noted mildly increased TSH - check T3 and free T4, may need to increase levothyroxine.  Time Spent Directly with Patient:  I have spent a total of 35 minutes with the patient reviewing hospital notes, telemetry, EKGs, labs and examining the patient as well as establishing an assessment and plan that was discussed personally with the patient.  > 50% of time was spent in direct patient care.  Length of Stay:  LOS: 1 day   Pixie Casino, MD, [facility], [place] Director of the Advanced Lipid Disorders &  Cardiovascular Risk Reduction Clinic Diplomate of the American Board of Clinical Lipidology Attending Cardiologist  Direct Dial: [phone]  Fax: [phone]  Website:  [email or web address] 04/28/2018, 8:51 AM

## 2018-04-28 NOTE — Discharge Instructions (Signed)

## 2018-04-28 NOTE — Progress Notes (Signed)
PROGRESS NOTE    Katherine [name]  ZSW:[id number] DOB: [birth year] DOA: 04/27/2018 PCP: [name], MD   Brief Narrative: Katherine [name] is a 85 y.o. female with medical history significant of uterine cancer; CVA; Paget's carcinoma of the nipple; hypothyroidism; HTN; HLD; and COPD presenting with SOB.  She woke up needing to go to the restroom and she just could not catch her breath.  She was really struggling to breathe.  She was previously on an antibiotic for PNA and had an xray upon completion and was diagnosed with CHF.  The facility was again concerned about PNA.  They started antibiotics on Tuesday, doxycycline, without improvement.  She did not feel well the last night or two with SOB and difficulty sleeping.  +cough, nonproductive.  +LE edema, changed from Lasix to [place] after having increased the dose a week or two ago.  Weight had increased from 99 to 106.  No fever.  She was hospitalized 1/31-2/4 for CAP; acute on chronic diastolic CHF; and new onset PAF.  She was started on Eliquis, treated with Azithromycin and Vantin, discharged on 24/7 O2, and diuresed with Lasix.  ED Course:  Likely new/worsening CHF.  Echo in February with diastolic CHF.  Has had antibiotics, yesterday with new afib, stopped Lasix and started Toresemide.  In mid-80s and worse.  Sent her in and on BIPAP which has now been weaned.  Reasonable family, DNR and no intubation.  Assessment & Plan:   Principal Problem:   Acute on chronic diastolic (congestive) heart failure (HCC) Active Problems:   COPD (chronic obstructive pulmonary disease) (HCC)   Hypothyroidism   Hypertension   Atrial fibrillation (HCC)   CKD (chronic kidney disease) stage 3, GFR 30-59 ml/min (HCC)   Pressure injury of skin  ##Acute on chronic congestive heart failure -Exacerbated by underlying pneumonia -Continue with IV Lasix  ##Acute hypoxic respiratory failure -Secondary to pneumonia, bronchitis, congestive heart failure, pleural  effusions -Continue with oxygen -At baseline does not use oxygen -Continue to treat underlying conditions and follow-up  ##COPD exacerbation -Patient was recently treated for CAP with the azithromycin, Vantin -Continue with the doxycycline IV -Keep the patient on duo nebs, Solu-Medrol -Patient states has remote history of smoking  ##Bilateral pleural effusions -Get CT chest without contrast -Patient might benefit from thoracentesis considering patient's shortness of breath, requiring high flow oxygen  ##Debility -Involved physical therapy  ##Paroxysmal atrial fibrillation -Continue with the Cardizem -Not on anticoagulation  ##Hypothyroidism -Continue the Synthroid    DVT prophylaxis: Lovenox Code Status: DNR Family Communication: No family member present at bedside.  Discussed with the patient Disposition Plan: SNF  Consultants:  Cardiology  Antimicrobials: Doxycycline 04/28/2018  Subjective: Coughing up productive sputum yellowish color, has shortness of breath  Objective: Vitals:   04/28/18 0404 04/28/18 0500 04/28/18 0823 04/28/18 1300  BP: 119/64  (!) 107/56 92/63  Pulse: 92  80 72  Resp: 18   (!) 22  Temp: 98 F (36.7 C)   98.6 F (37 C)  TempSrc: Oral   Oral  SpO2: 93%   93%  Weight:  67.1 kg    Height:        Intake/Output Summary (Last 24 hours) at 04/28/2018 1506 Last data filed at 04/28/2018 0900 Gross per 24 hour  Intake 903 ml  Output 1350 ml  Net -447 ml   Filed Weights   04/27/18 1117 04/27/18 1505 04/28/18 0500  Weight: 48.1 kg 49.1 kg 67.1 kg    Examination:  General exam: Appears calm and comfortable  Respiratory system: Bilateral occasional wheezing, decreased breath sounds in bilateral lower lobes cardiovascular system: S1 & S2 heard, RRR. No JVD, murmurs, rubs, gallops or clicks. No pedal edema. Gastrointestinal system: Abdomen is nondistended, soft and nontender. No organomegaly or masses felt. Normal bowel sounds heard.  Central nervous system: Alert and oriented. No focal neurological deficits. Extremities: Symmetric 5 x 5 power. Skin: No rashes, lesions or ulcers Psychiatry: Judgement and insight appear normal. Mood & affect appropriate.     Data Reviewed: I have personally reviewed following labs and imaging studies  CBC: Recent Labs  Lab 04/27/18 1123 04/28/18 0427  WBC 11.0* 9.0  HGB 10.2* 9.9*  HCT 33.1* 29.7*  MCV 97.1 94.0  PLT 360 182   Basic Metabolic Panel: Recent Labs  Lab 04/27/18 1123 04/28/18 0427  NA 132* 135  K 4.4 3.6  CL 98 96*  CO2 24 29  GLUCOSE 121* 103*  BUN 18 18  CREATININE 1.23* 1.33*  CALCIUM 8.8* 9.2   GFR: Estimated Creatinine Clearance: 19.2 mL/min (A) (by C-G formula based on SCr of 1.33 mg/dL (H)). Liver Function Tests: Recent Labs  Lab 04/27/18 1123  AST 26  ALT 16  ALKPHOS 66  BILITOT 0.6  PROT 5.8*  ALBUMIN 3.0*   No results for input(s): LIPASE, AMYLASE in the last 168 hours. No results for input(s): AMMONIA in the last 168 hours. Coagulation Profile: No results for input(s): INR, PROTIME in the last 168 hours. Cardiac Enzymes: Recent Labs  Lab 04/27/18 1123  TROPONINI <0.03   BNP (last 3 results) No results for input(s): PROBNP in the last 8760 hours. HbA1C: No results for input(s): HGBA1C in the last 72 hours. CBG: No results for input(s): GLUCAP in the last 168 hours. Lipid Profile: No results for input(s): CHOL, HDL, LDLCALC, TRIG, CHOLHDL, LDLDIRECT in the last 72 hours. Thyroid Function Tests: Recent Labs    04/27/18 1523  TSH 10.029*  FREET4 1.63   Anemia Panel: No results for input(s): VITAMINB12, FOLATE, FERRITIN, TIBC, IRON, RETICCTPCT in the last 72 hours. Sepsis Labs: No results for input(s): PROCALCITON, LATICACIDVEN in the last 168 hours.  Recent Results (from the past 240 hour(s))  MRSA PCR Screening     Status: None   Collection Time: 04/27/18  3:03 PM  Result Value Ref Range Status   MRSA by PCR  NEGATIVE NEGATIVE Final    Comment:        The GeneXpert MRSA Assay (FDA approved for NASAL specimens only), is one component of a comprehensive MRSA colonization surveillance program. It is not intended to diagnose MRSA infection nor to guide or monitor treatment for MRSA infections. Performed at [facility], [place] [address] [place], [place] [address]          Radiology Studies: Dg Chest Port 1 View  Result Date: 04/28/2018 CLINICAL DATA:  Shortness of breath. EXAM: PORTABLE CHEST 1 VIEW COMPARISON:  04/27/2018 and prior exams FINDINGS: Cardiomegaly, pulmonary vascular congestion, interstitial pulmonary edema, bilateral pleural effusions, RIGHT greater than LEFT, and bilateral LOWER lung atelectasis again noted. No pneumothorax. No significant change from the prior study. IMPRESSION: Unchanged appearance of the chest with pulmonary edema, bilateral pleural effusions, RIGHT greater than LEFT, and bibasilar atelectasis. Electronically Signed   By: Margarette Canada M.D.   On: 04/28/2018 09:54   Dg Chest Portable 1 View  Result Date: 04/27/2018 CLINICAL DATA:  Shortness of breath. EXAM: PORTABLE CHEST 1 VIEW COMPARISON:  Radiograph  March 17, 2018. FINDINGS: Stable cardiomegaly. Atherosclerosis of thoracic aorta is noted. No pneumothorax is noted. Interval development of mild to moderate bilateral pleural effusions are noted. Bilateral interstitial densities are noted which may represent edema. Bony thorax is unremarkable. IMPRESSION: Stable cardiomegaly with probable bilateral pulmonary edema and interval development of mild to moderate bilateral pleural effusions. Electronically Signed   By: Marijo Conception, M.D.   On: 04/27/2018 13:35        Scheduled Meds: . apixaban  2.5 mg Oral BID  . aspirin EC  81 mg Oral Daily  . diltiazem  120 mg Oral Daily  . fluticasone  2 spray Each Nare Daily  . [START ON 04/29/2018] furosemide  40 mg Oral Daily  . ipratropium-albuterol  3 mL  Nebulization Q8H  . levothyroxine  88 mcg Oral QAC breakfast  . metoprolol tartrate  12.5 mg Oral BID  . mometasone-formoterol  2 puff Inhalation BID  . pantoprazole  40 mg Oral Daily  . sodium chloride flush  3 mL Intravenous Q12H   Continuous Infusions: . sodium chloride       LOS: 1 day    Time spent: 40 minutes   Katherine Degraffenreid, MD [facility] Pager [phone]  If 7PM-7AM, please contact night-coverage [email or web address] Password [facility] 04/28/2018, 3:06 PM

## 2018-04-28 NOTE — Evaluation (Signed)
Occupational Therapy Evaluation Patient Details Name: Katherine [name] MRN: [id number] DOB: 27-Aug-1917 Today's Date: 04/28/2018    History of Present Illness Pt is a 85 y.o. female admitted from [facility]F (where she has ben living x 1 month, typically lives in [place]) on 04/27/18 with SOB, cough and LE edema; worked up for worsening CHF. PMH includes CVA, HTN, COPD. Of note, recent admission for respiratory distress thought to be due to combination of CAP and CHF.   Clinical Impression   Pt was independent until 1 month ago and since has been in SNF at [facility]. She presents with shortness of breath, generalized weakness and decreased activity tolerance. She requires up to min assist for ADL and OOB mobility. Pt will return to SNF for further rehab upon discharge. Will follow acutely.    Follow Up Recommendations  SNF;Supervision/Assistance - 24 hour    Equipment Recommendations  None recommended by OT    Recommendations for Other Services       Precautions / Restrictions Precautions Precautions: Fall Precaution Comments: Wears 2L O2 baseline, currently on 8L Restrictions Weight Bearing Restrictions: No      Mobility Bed Mobility Overal bed mobility: Modified Independent             General bed mobility comments: HOB up, increased time  Transfers Overall transfer level: Needs assistance Equipment used: 2 person hand held assist Transfers: Sit to/from [facility] Sit to Stand: Min assist Stand pivot transfers: Min assist       General transfer comment: assist to rise and steady    Balance Overall balance assessment: Needs assistance   Sitting balance-Leahy Scale: Good       Standing balance-Leahy Scale: Poor Standing balance comment: Reliant on UE support                           ADL either performed or assessed with clinical judgement   ADL Overall ADL's : Needs assistance/impaired Eating/Feeding:  Independent;Sitting   Grooming: Set up;Sitting   Upper Body Bathing: Minimal assistance;Sitting   Lower Body Bathing: Minimal assistance;Sit to/from stand   Upper Body Dressing : Set up;Sitting   Lower Body Dressing: Minimal assistance;Sit to/from stand   Toilet Transfer: Minimal assistance;Stand-pivot;BSC   Toileting- Clothing Manipulation and Hygiene: Minimal assistance;Sit to/from stand               Vision Baseline Vision/History: Wears glasses Wears Glasses: At all times Patient Visual Report: No change from baseline       Perception     Praxis      Pertinent Vitals/Pain Pain Assessment: No/denies pain     Hand Dominance Right   Extremity/Trunk Assessment Upper Extremity Assessment Upper Extremity Assessment: Overall WFL for tasks assessed   Lower Extremity Assessment Lower Extremity Assessment: Defer to PT evaluation   Cervical / Trunk Assessment Cervical / Trunk Assessment: Kyphotic   Communication Communication Communication: HOH   Cognition Arousal/Alertness: Awake/alert Behavior During Therapy: WFL for tasks assessed/performed Overall Cognitive Status: Within Functional Limits for tasks assessed                                     General Comments       Exercises     Shoulder Instructions      Home Living Family/patient expects to be discharged to:: Skilled nursing facility  Prior Functioning/Environment Level of Independence: Needs assistance  Gait / Transfers Assistance Needed: Ambulatory with RW. [facility] with PT at [facility] Denies falls in past 6 months. Enjoys reading ADL's / Homemaking Assistance Needed: Has assist for bathing. Pt dresses self. Meals provided   Comments: Pt was driving and living independently prior to one month ago.        OT Problem List: Decreased strength;Decreased activity tolerance;Impaired balance (sitting and/or  standing);Cardiopulmonary status limiting activity      OT Treatment/Interventions: Self-care/ADL training;Energy conservation;DME and/or AE instruction;Balance training;Therapeutic activities;Patient/family education    OT Goals(Current goals can be found in the care plan section) Acute Rehab OT Goals Patient Stated Goal: Return home breathing better OT Goal Formulation: With patient Time For Goal Achievement: 05/12/18 Potential to Achieve Goals: Good ADL Goals Pt Will Perform Grooming: with min guard assist;standing Pt Will Perform Upper Body Dressing: with set-up;sitting Pt Will Perform Lower Body Dressing: with min guard assist;sit to/from stand Pt Will Transfer to Toilet: with min guard assist;ambulating;bedside commode(over toilet) Pt Will Perform Toileting - Clothing Manipulation and hygiene: with min guard assist;sit to/from stand Additional ADL Goal #1: Pt will generalize energy conservation strategies in ADL and mobility independently.  OT Frequency: Min 2X/week   Barriers to D/C:            Co-evaluation              AM-PAC OT "6 Clicks" Daily Activity     Outcome Measure Help from another person eating meals?: None Help from another person taking care of personal grooming?: A Little Help from another person toileting, which includes using toliet, bedpan, or urinal?: A Little Help from another person bathing (including washing, rinsing, drying)?: A Little Help from another person to put on and taking off regular upper body clothing?: A Little Help from another person to put on and taking off regular lower body clothing?: A Little 6 Click Score: 19   End of Session Equipment Utilized During Treatment: Rolling walker;Oxygen(8L)  Activity Tolerance: Patient limited by fatigue Patient left: in chair;with call bell/phone within reach;with chair alarm set;Other (comment)(MD in room)  OT Visit Diagnosis: Unsteadiness on feet (R26.81);Other abnormalities of gait and  mobility (R26.89);Muscle weakness (generalized) (M62.81);Other (comment)(decreased activity tolerance)                Time: 0370-4888 OT Time Calculation (min): 24 min Charges:  OT General Charges $OT Visit: 1 Visit OT Evaluation $OT Eval Moderate Complexity: 1 Mod OT Treatments $Self Care/Home Management : 8-22 mins  [name] 04/28/2018, 12:12 PM  Nestor Lewandowsky, OTR/L Acute Rehabilitation Services Pager: [phone] Office: [phone]

## 2018-04-28 NOTE — TOC Initial Note (Signed)
Transition of Care [facility] - Initial/Assessment Note    Patient Details  Name: FIONNA MERRIOTT MRN: [id number] Date of Birth: [birth year]  Transition of Care [facility]) CM/SW Contact:    Candie Chroman, LCSW Phone Number: 04/28/2018, 2:35 PM  Clinical Narrative: CSW met with patient. No supports at bedside. CSW introduced role and explained that discharge planning would be discussed. Patient confirmed she was admitted from the rehab side at [facility] and plans to return there as long as she needs it. Patient is originally from their ILF side. She is unsure if she will need ALF/ILF after rehab. Patient is interested in getting services through [facility] once she discharges from rehab. Readmission risk assessment complete. Patient repots that she has to pay $25 round trip for a transportation service through [facility] to get to appointments, pharmacy, etc. She is interested in pharmacy education because she said "I don't know what I'm taking." Pharmacist is aware. Patient in HFNC 8 L and cannot discharge to SNF until weaned off HFNC. Per MD, patient will be here over the weekend. SNF admissions coordinator is aware. No further concerns. CSW encouraged patient to contact CSW as needed. CSW will continue to follow patient for support and facilitate discharge back to SNF once medically stable.               Expected Discharge Plan: [place] Barriers to Discharge: Continued Medical Work up   Patient Goals and CMS Choice        Expected Discharge Plan and Services Expected Discharge Plan: [place] Choice: [place] Living arrangements for the past 2 months: [place], [place]                          Prior Living Arrangements/Services Living arrangements for the past 2 months: [place], [place] Lives with:: Self Patient language and need for  interpreter reviewed:: No Do you feel safe going back to the place where you live?: Yes      Need for Family Participation in Patient Care: Yes (Comment)     Criminal Activity/Legal Involvement Pertinent to Current Situation/Hospitalization: No - Comment as needed  Activities of Daily Living      Permission Sought/Granted Permission sought to share information with : Facility Art therapist granted to share information with : Yes, Verbal Permission Granted     Permission granted to share info w AGENCY: [facility]        Emotional Assessment Appearance:: Appears stated age Attitude/Demeanor/Rapport: Engaged, Gracious Affect (typically observed): Accepting, Appropriate, Calm, Pleasant Orientation: : Oriented to Self, Oriented to Place, Oriented to  Time, Oriented to Situation Alcohol / Substance Use: Never Used Psych Involvement: No (comment)  Admission diagnosis:  Acute respiratory failure with hypoxia (HCC) [J96.01] Atrial fibrillation, unspecified type (HCC) [I48.91] Acute congestive heart failure, unspecified heart failure type [facility]) [I50.9] Patient Active Problem List   Diagnosis Date Noted  . Pressure injury of skin 04/28/2018  . Acute on chronic diastolic (congestive) heart failure ([place]) 04/27/2018  . Diastolic CHF ([place]) 01/65/5374  . Fatigue 04/25/2018  . Edema 04/03/2018  . Acute on chronic respiratory failure with hypoxia ([place]) 03/17/2018  . Atrial fibrillation ([place]) 03/17/2018  . Volume overload 03/17/2018  . Chronic anemia 03/17/2018  . CKD (chronic kidney disease) stage 3, GFR 30-59 ml/min (HCC) 03/17/2018  . Breast cancer ([place])  03/16/2013  . Paget's disease and intraductal carcinoma of left breast ([place]) 03/16/2013  . Aftercare following surgery of the circulatory system, [place] 01/03/2013  . COPD (chronic obstructive pulmonary disease) with acute bronchitis ([place]) 03/16/2012  . Altered mental status 03/16/2012  . Occlusion and  stenosis of carotid artery without mention of cerebral infarction 01/06/2012  . Bronchiectasis without acute exacerbation ([place]) 08/20/2011  . Atypical mycobacterial disease 08/20/2011  . Hypothyroidism 07/24/2011  . Hyperlipidemia 07/24/2011  . Hypertension 07/24/2011  . CAP (community acquired pneumonia) 07/23/2011  . COPD (chronic obstructive pulmonary disease) ([place]) 07/23/2011  . Hemoptysis 07/23/2011   PCP:  [name], MD Pharmacy:  No Pharmacies Listed    Social Determinants of Health (SDOH) Interventions    Readmission Risk Interventions 30 Day Unplanned Readmission Risk Score     ED to Hosp-Admission (Current) from 04/27/2018 in [place] CHF  30 Day Unplanned Readmission Risk Score (%)  23 Filed at 04/28/2018 1200     This score is the patient's risk of an unplanned readmission within 30 days of being discharged (0 -100%). The score is based on dignosis, age, lab data, medications, orders, and past utilization.   Low:  0-14.9   Medium: 15-21.9   High: 22-29.9   Extreme: 30 and above       Readmission Risk Prevention Plan 04/28/2018  Transportation Screening Complete  PCP or Specialist Appt within 3-5 Days Not Complete  Not Complete comments Will check closer to discharge.  [place] or Home Care Consult Not Complete  Social Work Consult for [place] Planning/Counseling Complete  Palliative Care Screening Not Applicable  Medication Review (RN Care Manager) Referral to Pharmacy  Some recent data might be hidden

## 2018-04-29 ENCOUNTER — Inpatient Hospital Stay (HOSPITAL_COMMUNITY): Payer: Medicare Other

## 2018-04-29 LAB — BODY FLUID CELL COUNT WITH DIFFERENTIAL
Lymphs, Fluid: 84 %
Monocyte-Macrophage-Serous Fluid: 11 % — ABNORMAL LOW (ref 50–90)
Neutrophil Count, Fluid: 5 % (ref 0–25)
Total Nucleated Cell Count, Fluid: 77 cu mm (ref 0–1000)

## 2018-04-29 LAB — CBC
HCT: 32.7 % — ABNORMAL LOW (ref 36.0–46.0)
Hemoglobin: 10.6 g/dL — ABNORMAL LOW (ref 12.0–15.0)
MCH: 31 pg (ref 26.0–34.0)
MCHC: 32.4 g/dL (ref 30.0–36.0)
MCV: 95.6 fL (ref 80.0–100.0)
Platelets: 326 10*3/uL (ref 150–400)
RBC: 3.42 MIL/uL — ABNORMAL LOW (ref 3.87–5.11)
RDW: 14.3 % (ref 11.5–15.5)
WBC: 10.1 10*3/uL (ref 4.0–10.5)
nRBC: 0.2 % (ref 0.0–0.2)

## 2018-04-29 LAB — GLUCOSE, PLEURAL OR PERITONEAL FLUID: Glucose, Fluid: 123 mg/dL

## 2018-04-29 LAB — LACTATE DEHYDROGENASE, PLEURAL OR PERITONEAL FLUID: LD, Fluid: 66 U/L — ABNORMAL HIGH (ref 3–23)

## 2018-04-29 LAB — BASIC METABOLIC PANEL
Anion gap: 8 (ref 5–15)
BUN: 21 mg/dL (ref 8–23)
CO2: 28 mmol/L (ref 22–32)
Calcium: 8.8 mg/dL — ABNORMAL LOW (ref 8.9–10.3)
Chloride: 96 mmol/L — ABNORMAL LOW (ref 98–111)
Creatinine, Ser: 1.2 mg/dL — ABNORMAL HIGH (ref 0.44–1.00)
GFR calc Af Amer: 44 mL/min — ABNORMAL LOW (ref >60)
GFR calc non Af Amer: 38 mL/min — ABNORMAL LOW (ref >60)
Glucose, Bld: 134 mg/dL — ABNORMAL HIGH (ref 70–99)
Potassium: 3.8 mmol/L (ref 3.5–5.1)
Sodium: 132 mmol/L — ABNORMAL LOW (ref 135–145)

## 2018-04-29 MED ORDER — AMIODARONE HCL 200 MG PO TABS
400.0000 mg | ORAL_TABLET | Freq: Two times a day (BID) | ORAL | Status: DC
  Administered 2018-04-29 – 2018-05-02 (×7): 400 mg via ORAL
  Filled 2018-04-29 (×7): qty 2

## 2018-04-29 MED ORDER — SODIUM CHLORIDE 0.9 % IV SOLN
1.0000 g | INTRAVENOUS | Status: DC
  Administered 2018-04-29 – 2018-05-02 (×4): 1 g via INTRAVENOUS
  Filled 2018-04-29 (×4): qty 10

## 2018-04-29 MED ORDER — METHYLPREDNISOLONE SODIUM SUCC 40 MG IJ SOLR
40.0000 mg | Freq: Four times a day (QID) | INTRAMUSCULAR | Status: DC
  Administered 2018-04-29 – 2018-04-30 (×4): 40 mg via INTRAVENOUS
  Filled 2018-04-29 (×5): qty 1

## 2018-04-29 MED ORDER — LIDOCAINE HCL (PF) 1 % IJ SOLN
INTRAMUSCULAR | Status: AC
  Filled 2018-04-29: qty 30

## 2018-04-29 NOTE — Progress Notes (Signed)
PROGRESS NOTE    Katherine [name]  WOE:[id number] DOB: November 04, 1917 DOA: 04/27/2018 PCP: [name], MD   Brief Narrative: Katherine [name] is a 85 y.o. female with medical history significant of uterine cancer; CVA; Paget's carcinoma of the nipple; hypothyroidism; HTN; HLD; and COPD presenting with SOB.  She woke up needing to go to the restroom and she just could not catch her breath.  She was really struggling to breathe.  She was previously on an antibiotic for PNA and had an xray upon completion and was diagnosed with CHF.  The facility was again concerned about PNA.  They started antibiotics on Tuesday, doxycycline, without improvement.  She did not feel well the last night or two with SOB and difficulty sleeping.  +cough, nonproductive.  +LE edema, changed from Lasix to [place] after having increased the dose a week or two ago.  Weight had increased from 99 to 106.  No fever.  She was hospitalized 1/31-2/4 for CAP; acute on chronic diastolic CHF; and new onset PAF.  She was started on Eliquis, treated with Azithromycin and Vantin, discharged on 24/7 O2, and diuresed with Lasix. Patient was in severe shortness of breath.  Initially required BiPAP.  Currently on high flow oxygen.  Patient's initial oxygen saturations were in the 80s.  Chest x-ray showed pulmonary edema, bilateral pleural effusions right greater than left, bibasilar atelectasis.  Patient is DO NOT RESUSCITATE DO NOT INTUBATE.   Assessment & Plan:   Principal Problem:   Acute on chronic diastolic (congestive) heart failure (HCC) Active Problems:   COPD (chronic obstructive pulmonary disease) (HCC)   Hypothyroidism   Hypertension   Atrial fibrillation (HCC)   CKD (chronic kidney disease) stage 3, GFR 30-59 ml/min (HCC)   Pressure injury of skin  ##Acute on chronic congestive heart failure -Exacerbated by underlying pneumonia -Patient had a recent echocardiogram done in January 2020 showed EF of 60% -Appreciate  cardiology follow-up -Continue with IV Lasix  ##Acute hypoxic respiratory failure -Secondary to pneumonia, COPD exacerbation, congestive heart failure, pleural effusions -Continue with oxygen on high flow oxygen -At baseline does not use oxygen -Continue to treat underlying conditions and follow-up  ##COPD exacerbation -Patient was recently treated for CAP with the azithromycin, Vantin -Continue with the doxycycline IV -Keep the patient on duo nebs, Solu-Medrol -Patient states has remote history of smoking  ##Bilateral pleural effusions -Get CT chest without contrast 04/29/2018-showed 14 mm right upper lobe nodule suspicious for primary bronchogenic neoplasm associated with mediastinal lymphadenopathy including a dominant 2.5 cm right paratracheal region.  Cardiomegaly with possible mild interstitial edema right more than the left. -Patient might benefit from thoracentesis considering patient's shortness of breath, requiring high flow oxygen -Highly concerning about loculated effusion, get thoracentesis to rule out underlying malignancy is therapeutic, diagnostic to rule out malignancy  ##Debility -Involved physical therapy  ##Paroxysmal atrial fibrillation -Continue with the Cardizem, amiodarone -Hold Xarelto  ##Hypothyroidism -Continue the Synthroid  ##Severe protein calorie malnutrition -Patient has low BMI of 22 -Continue with high-protein diet    DVT prophylaxis: Lovenox Code Status: DNR Family Communication: No family member present at bedside.  Discussed with the patient Disposition Plan: SNF  Consultants:  Cardiology  Antimicrobials:  Doxycycline 04/28/2018 Rocephin 04/29/2018  Subjective: Still complaining of severe shortness of breath  Objective: Vitals:   04/29/18 0846 04/29/18 0858 04/29/18 0925 04/29/18 1357  BP: 115/70     Pulse: (!) 115 98    Resp:      Temp:  TempSrc:      SpO2:   92% 94%  Weight:      Height:        Intake/Output  Summary (Last 24 hours) at 04/29/2018 1426 Last data filed at 04/29/2018 1307 Gross per 24 hour  Intake 800 ml  Output 1800 ml  Net -1000 ml   Filed Weights   04/27/18 1505 04/28/18 0500 04/29/18 0636  Weight: 49.1 kg 67.1 kg 48.5 kg    Examination:  General exam: Appears calm and comfortable  Respiratory system: Bilateral occasional wheezing, decreased breath sounds in bilateral lower lobes cardiovascular system: S1 & S2 heard, RRR. No JVD, murmurs, rubs, gallops or clicks. No pedal edema. Gastrointestinal system: Abdomen is nondistended, soft and nontender. No organomegaly or masses felt. Normal bowel sounds heard. Central nervous system: Alert and oriented. No focal neurological deficits. Extremities: Symmetric 5 x 5 power. Skin: No rashes, lesions or ulcers Psychiatry: Judgement and insight appear normal. Mood & affect appropriate.     Data Reviewed: I have personally reviewed following labs and imaging studies  CBC: Recent Labs  Lab 04/27/18 1123 04/28/18 0427 04/29/18 0914  WBC 11.0* 9.0 10.1  HGB 10.2* 9.9* 10.6*  HCT 33.1* 29.7* 32.7*  MCV 97.1 94.0 95.6  PLT 360 346 528   Basic Metabolic Panel: Recent Labs  Lab 04/27/18 1123 04/28/18 0427 04/29/18 0914  NA 132* 135 132*  K 4.4 3.6 3.8  CL 98 96* 96*  CO2 24 29 28   GLUCOSE 121* 103* 134*  BUN 18 18 21   CREATININE 1.23* 1.33* 1.20*  CALCIUM 8.8* 9.2 8.8*   GFR: Estimated Creatinine Clearance: 16.9 mL/min (A) (by C-G formula based on SCr of 1.2 mg/dL (H)). Liver Function Tests: Recent Labs  Lab 04/27/18 1123  AST 26  ALT 16  ALKPHOS 66  BILITOT 0.6  PROT 5.8*  ALBUMIN 3.0*   No results for input(s): LIPASE, AMYLASE in the last 168 hours. No results for input(s): AMMONIA in the last 168 hours. Coagulation Profile: No results for input(s): INR, PROTIME in the last 168 hours. Cardiac Enzymes: Recent Labs  Lab 04/27/18 1123  TROPONINI <0.03   BNP (last 3 results) No results for input(s):  PROBNP in the last 8760 hours. HbA1C: No results for input(s): HGBA1C in the last 72 hours. CBG: No results for input(s): GLUCAP in the last 168 hours. Lipid Profile: No results for input(s): CHOL, HDL, LDLCALC, TRIG, CHOLHDL, LDLDIRECT in the last 72 hours. Thyroid Function Tests: Recent Labs    04/27/18 1523  TSH 10.029*  FREET4 1.63   Anemia Panel: No results for input(s): VITAMINB12, FOLATE, FERRITIN, TIBC, IRON, RETICCTPCT in the last 72 hours. Sepsis Labs: No results for input(s): PROCALCITON, LATICACIDVEN in the last 168 hours.  Recent Results (from the past 240 hour(s))  Blood culture (routine x 2)     Status: None (Preliminary result)   Collection Time: 04/27/18 11:46 AM  Result Value Ref Range Status   Specimen Description BLOOD LEFT ANTECUBITAL  Final   Special Requests   Final    BOTTLES DRAWN AEROBIC AND ANAEROBIC Blood Culture adequate volume   Culture   Final    NO GROWTH 1 DAY Performed at [facility], [address], [place], [place] [address]    Report Status PENDING  Incomplete  Blood culture (routine x 2)     Status: None (Preliminary result)   Collection Time: 04/27/18 11:55 AM  Result Value Ref Range Status   Specimen Description BLOOD  LEFT FOREARM  Final   Special Requests   Final    BOTTLES DRAWN AEROBIC AND ANAEROBIC Blood Culture results may not be optimal due to an inadequate volume of blood received in culture bottles   Culture   Final    NO GROWTH 1 DAY Performed at [facility], [place] [address] [place], [place] [address]    Report Status PENDING  Incomplete  MRSA PCR Screening     Status: None   Collection Time: 04/27/18  3:03 PM  Result Value Ref Range Status   MRSA by PCR NEGATIVE NEGATIVE Final    Comment:        The GeneXpert MRSA Assay (FDA approved for NASAL specimens only), is one component of a comprehensive MRSA colonization surveillance program. It is not intended to diagnose MRSA infection nor to guide  or monitor treatment for MRSA infections. Performed at [facility], [place] [address], [place], [place] [address]          Radiology Studies: Ct Chest Wo Contrast  Result Date: 04/29/2018 CLINICAL DATA:  Bilateral pleural effusions on chest radiograph EXAM: CT CHEST WITHOUT CONTRAST TECHNIQUE: Multidetector CT imaging of the chest was performed following the standard protocol without IV contrast. COMPARISON:  Chest radiograph dated 04/28/2018 FINDINGS: Cardiovascular: Mild cardiomegaly with left atrial enlargement. No evidence of thoracic aortic aneurysm. Atherosclerotic calcifications of the aortic arch. Three vessel coronary atherosclerosis. Mediastinum/Nodes: Mediastinal lymphadenopathy, including a dominant 2.5 cm short axis node in the low right paratracheal region (series 3/image 60) and 1.6 cm short axis subcarinal node (series 3/image 69). Visualized thyroid is notable for an 8 mm calcified right thyroid nodule. Lungs/Pleura: 13 x 14 mm central right upper lobe nodule (series 4/image 87), suspicious for primary bronchogenic neoplasm. Additional linear/platelike opacities in the right upper lobe, nonspecific. Right lower lobe opacity favors compressive atelectasis (series 3/image 86). Additional atelectasis in the right middle lobe and left lower lobe. Moderate right and small left pleural effusions. Scattered interstitial opacities with interlobular septal thickening in the bilateral upper lobes, suggesting very mild edema. Mild to moderate centrilobular emphysematous changes, upper lobe predominant. No pneumothorax. Upper Abdomen: Visualized upper abdomen is notable for vascular calcifications. Musculoskeletal: Degenerative changes of the visualized thoracolumbar spine. IMPRESSION: 14 mm right upper lobe nodule, suspicious for primary bronchogenic neoplasm. Associated mediastinal lymphadenopathy, including a dominant 2.5 cm short axis node in the low right paratracheal region. Cardiomegaly  with possible mild interstitial edema. Moderate right and small left pleural effusions. Aortic Atherosclerosis (ICD10-I70.0) and Emphysema (ICD10-J43.9). Electronically Signed   By: Julian Hy M.D.   On: 04/29/2018 11:09   Dg Chest Port 1 View  Result Date: 04/28/2018 CLINICAL DATA:  Shortness of breath. EXAM: PORTABLE CHEST 1 VIEW COMPARISON:  04/27/2018 and prior exams FINDINGS: Cardiomegaly, pulmonary vascular congestion, interstitial pulmonary edema, bilateral pleural effusions, RIGHT greater than LEFT, and bilateral LOWER lung atelectasis again noted. No pneumothorax. No significant change from the prior study. IMPRESSION: Unchanged appearance of the chest with pulmonary edema, bilateral pleural effusions, RIGHT greater than LEFT, and bibasilar atelectasis. Electronically Signed   By: Margarette Canada M.D.   On: 04/28/2018 09:54        Scheduled Meds:  amiodarone  400 mg Oral BID   apixaban  2.5 mg Oral BID   diltiazem  120 mg Oral Daily   fluticasone  2 spray Each Nare Daily   furosemide  40 mg Oral Daily   ipratropium-albuterol  3 mL Nebulization TID  levothyroxine  88 mcg Oral QAC breakfast   metoprolol tartrate  12.5 mg Oral BID   mometasone-formoterol  2 puff Inhalation BID   pantoprazole  40 mg Oral Daily   sodium chloride flush  3 mL Intravenous Q12H   Continuous Infusions:  sodium chloride       LOS: 2 days    Time spent: 40 minutes   Kalid Ghan, MD [facility] Pager [phone]  If 7PM-7AM, please contact night-coverage [email or web address] Password [email or web address] 04/29/2018, 2:26 PM

## 2018-04-29 NOTE — Progress Notes (Signed)
MEWS/VS Documentation      04/29/2018 1936 04/29/2018 2055 04/29/2018 2208 04/29/2018 2210   MEWS Score:  -  3  4  3    MEWS Score Color:  -  Yellow  Red  Yellow   Resp:  -  18  -  -   Pulse:  -  (!) 130  (!) 140  (!) 139   BP:  -  (!) 103/53  97/62  111/77   Temp:  -  97.7 F (36.5 C)  -  -   O2 Device:  HFNC  HFNC  -  -   O2 Flow Rate (L/min):  8 L/min  8 L/min  -  -

## 2018-04-29 NOTE — Progress Notes (Signed)
DAILY PROGRESS NOTE   Patient Name: Katherine [name] Date of Encounter: 04/29/2018 Cardiologist: No primary care provider on file.     Patient Profile   Katherine [name] is a 85 y.o.admitted with SOB with COPD exacerbation and acute on chronic heart failure.  Persistent atrial fibrillation noted on ECG 1/20.     hHx of uterine cancer, Paget's carcinoma of the nipple, CVA, hypertension, hyperlipidemia, carotid artery disease s/p left carotid endarterectomy in 2010,   Chest CT 3/20 14 mm right upper lobe nodule concerning for malignancy--Mild interstitial edema   -- Centrilobular emphysema Echo 2/20 LV function normal  Subjective    cold but no chest pain or shortness of breath  Objective   Vitals:   04/29/18 0636 04/29/18 0846 04/29/18 0858 04/29/18 0925  BP: (!) 107/52 115/70    Pulse: 95 (!) 115 98   Resp: 20     Temp: 98.9 F (37.2 C)     TempSrc: Oral     SpO2: (!) 88%   92%  Weight: 48.5 kg     Height:        Intake/Output Summary (Last 24 hours) at 04/29/2018 1121 Last data filed at 04/29/2018 1019 Gross per 24 hour  Intake 1160 ml  Output 1250 ml  Net -90 ml   Filed Weights   04/27/18 1505 04/28/18 0500 04/29/18 0636  Weight: 49.1 kg 67.1 kg 48.5 kg    Physical Exam  Well developed and nourished in no acute distress HENT normal Neck supple with JVP-8-10 Crackles and egophony consistent w effusions Irregularly irregular rate and rhythm with controlled ventricular response, no murmurs or gallops Abd-soft with active BS without hepatomegaly No Clubbing cyanosis edema Skin-warm and dry A & Oriented  Grossly normal sensory and motor function   Inpatient Medications    Scheduled Meds:  apixaban  2.5 mg Oral BID   aspirin EC  81 mg Oral Daily   diltiazem  120 mg Oral Daily   fluticasone  2 spray Each Nare Daily   furosemide  40 mg Oral Daily   ipratropium-albuterol  3 mL Nebulization TID   levothyroxine  88 mcg Oral QAC breakfast   metoprolol  tartrate  12.5 mg Oral BID   mometasone-formoterol  2 puff Inhalation BID   pantoprazole  40 mg Oral Daily   sodium chloride flush  3 mL Intravenous Q12H    Continuous Infusions:  sodium chloride      PRN Meds: sodium chloride, acetaminophen, albuterol, ondansetron (ZOFRAN) IV, sodium chloride flush   Labs   Results for orders placed or performed during the hospital encounter of 04/27/18 (from the past 48 hour(s))  CBC     Status: Abnormal   Collection Time: 04/27/18 11:23 AM  Result Value Ref Range   WBC 11.0 (H) 4.0 - 10.5 K/uL   RBC 3.41 (L) 3.87 - 5.11 MIL/uL   Hemoglobin 10.2 (L) 12.0 - 15.0 g/dL   HCT 33.1 (L) 36.0 - 46.0 %   MCV 97.1 80.0 - 100.0 fL   MCH 29.9 26.0 - 34.0 pg   MCHC 30.8 30.0 - 36.0 g/dL   RDW 14.6 11.5 - 15.5 %   Platelets 360 150 - 400 K/uL   nRBC 0.0 0.0 - 0.2 %    Comment: Performed at [facility], [address], [place], [place] [address]  Comprehensive metabolic panel     Status: Abnormal   Collection Time: 04/27/18 11:23 AM  Result Value Ref Range  Sodium 132 (L) 135 - 145 mmol/L   Potassium 4.4 3.5 - 5.1 mmol/L   Chloride 98 98 - 111 mmol/L   CO2 24 22 - 32 mmol/L   Glucose, Bld 121 (H) 70 - 99 mg/dL   BUN 18 8 - 23 mg/dL   Creatinine, Ser 1.23 (H) 0.44 - 1.00 mg/dL   Calcium 8.8 (L) 8.9 - 10.3 mg/dL   Total Protein 5.8 (L) 6.5 - 8.1 g/dL   Albumin 3.0 (L) 3.5 - 5.0 g/dL   AST 26 15 - 41 U/L   ALT 16 0 - 44 U/L   Alkaline Phosphatase 66 38 - 126 U/L   Total Bilirubin 0.6 0.3 - 1.2 mg/dL   GFR calc non Af Amer 36 (L) >60 mL/min   GFR calc Af Amer 42 (L) >60 mL/min   Anion gap 10 5 - 15    Comment: Performed at [facility] [address], [place], [place] [address]  Troponin I - ONCE - STAT     Status: None   Collection Time: 04/27/18 11:23 AM  Result Value Ref Range   Troponin I <0.03 <0.03 ng/mL    Comment: Performed at [facility] [address], [place], [place] [address]  Brain natriuretic peptide      Status: Abnormal   Collection Time: 04/27/18 11:23 AM  Result Value Ref Range   B Natriuretic Peptide 289.4 (H) 0.0 - 100.0 pg/mL    Comment: Performed at [facility] [address], [place], [place] [address]  Blood culture (routine x 2)     Status: None (Preliminary result)   Collection Time: 04/27/18 11:46 AM  Result Value Ref Range   Specimen Description BLOOD LEFT ANTECUBITAL    Special Requests      BOTTLES DRAWN AEROBIC AND ANAEROBIC Blood Culture adequate volume   Culture      NO GROWTH 1 DAY Performed at [facility], [place] [address] [place], [place] [address]    Report Status PENDING   Blood culture (routine x 2)     Status: None (Preliminary result)   Collection Time: 04/27/18 11:55 AM  Result Value Ref Range   Specimen Description BLOOD LEFT FOREARM    Special Requests      BOTTLES DRAWN AEROBIC AND ANAEROBIC Blood Culture results may not be optimal due to an inadequate volume of blood received in culture bottles   Culture      NO GROWTH 1 DAY Performed at [facility], [place] [address], [place], [place] [address]    Report Status PENDING   Influenza panel by PCR (type A & B)     Status: None   Collection Time: 04/27/18  1:12 PM  Result Value Ref Range   Influenza A By PCR NEGATIVE NEGATIVE   Influenza B By PCR NEGATIVE NEGATIVE    Comment: (NOTE) The Xpert Xpress Flu assay is intended as an aid in the diagnosis of  influenza and should not be used as a sole basis for treatment.  This  assay is FDA approved for nasopharyngeal swab specimens only. Nasal  washings and aspirates are unacceptable for Xpert Xpress Flu testing. Performed at [facility], [place] [address] [place], [place] [address]   MRSA PCR Screening     Status: None   Collection Time: 04/27/18  3:03 PM  Result Value Ref Range   MRSA by PCR NEGATIVE NEGATIVE    Comment:  The GeneXpert MRSA Assay (FDA approved for NASAL specimens only), is one component of  a comprehensive MRSA colonization surveillance program. It is not intended to diagnose MRSA infection nor to guide or monitor treatment for MRSA infections. Performed at [facility], [place] [address], [place], [place] [address]   TSH     Status: Abnormal   Collection Time: 04/27/18  3:23 PM  Result Value Ref Range   TSH 10.029 (H) 0.350 - 4.500 uIU/mL    Comment: Performed by a 3rd Generation assay with a functional sensitivity of <=0.01 uIU/mL. Performed at [facility], [place] [address], [place], [place] [address]   T4, free     Status: None   Collection Time: 04/27/18  3:23 PM  Result Value Ref Range   Free T4 1.63 0.82 - 1.77 ng/dL    Comment: (NOTE) Biotin ingestion may interfere with free T4 tests. If the results are inconsistent with the TSH level, previous test results, or the clinical presentation, then consider biotin interference. If needed, order repeat testing after stopping biotin. Performed at [facility], [place] [address], [place], [place] [address]   Basic metabolic panel     Status: Abnormal   Collection Time: 04/28/18  4:27 AM  Result Value Ref Range   Sodium 135 135 - 145 mmol/L   Potassium 3.6 3.5 - 5.1 mmol/L   Chloride 96 (L) 98 - 111 mmol/L   CO2 29 22 - 32 mmol/L   Glucose, Bld 103 (H) 70 - 99 mg/dL   BUN 18 8 - 23 mg/dL   Creatinine, Ser 1.33 (H) 0.44 - 1.00 mg/dL   Calcium 9.2 8.9 - 10.3 mg/dL   GFR calc non Af Amer 33 (L) >60 mL/min   GFR calc Af Amer 38 (L) >60 mL/min   Anion gap 10 5 - 15    Comment: Performed at [facility] [address] [place], [place] [address]  CBC     Status: Abnormal   Collection Time: 04/28/18  4:27 AM  Result Value Ref Range   WBC 9.0 4.0 - 10.5 K/uL   RBC 3.16 (L) 3.87 - 5.11 MIL/uL   Hemoglobin 9.9 (L) 12.0 - 15.0 g/dL   HCT 29.7 (L) 36.0 - 46.0 %   MCV 94.0 80.0 - 100.0 fL   MCH 31.3 26.0 - 34.0 pg   MCHC 33.3 30.0 - 36.0 g/dL   RDW 14.4 11.5 - 15.5 %   Platelets 346 150 - 400 K/uL    nRBC 0.0 0.0 - 0.2 %    Comment: Performed at [facility], [place] [address] [place], [place] [address]  CBC     Status: Abnormal   Collection Time: 04/29/18  9:14 AM  Result Value Ref Range   WBC 10.1 4.0 - 10.5 K/uL   RBC 3.42 (L) 3.87 - 5.11 MIL/uL   Hemoglobin 10.6 (L) 12.0 - 15.0 g/dL   HCT 32.7 (L) 36.0 - 46.0 %   MCV 95.6 80.0 - 100.0 fL   MCH 31.0 26.0 - 34.0 pg   MCHC 32.4 30.0 - 36.0 g/dL   RDW 14.3 11.5 - 15.5 %   Platelets 326 150 - 400 K/uL   nRBC 0.2 0.0 - 0.2 %    Comment: Performed at [facility], [place] [address] [place],  [address]  Basic metabolic panel     Status: Abnormal   Collection Time: 04/29/18  9:14 AM  Result  Value Ref Range   Sodium 132 (L) 135 - 145 mmol/L   Potassium 3.8 3.5 - 5.1 mmol/L   Chloride 96 (L) 98 - 111 mmol/L   CO2 28 22 - 32 mmol/L   Glucose, Bld 134 (H) 70 - 99 mg/dL   BUN 21 8 - 23 mg/dL   Creatinine, Ser 1.20 (H) 0.44 - 1.00 mg/dL   Calcium 8.8 (L) 8.9 - 10.3 mg/dL   GFR calc non Af Amer 38 (L) >60 mL/min   GFR calc Af Amer 44 (L) >60 mL/min   Anion gap 8 5 - 15    Comment: Performed at [facility] [address], [place], [place] [address]    ECG  Atrial fibrillation noted on all tracings since 1/20 but not in 2013  Telemetry   Telemetry Personally reviewed  aFIB Rates  variable  Radiology    Ct Chest Wo Contrast  Result Date: 04/29/2018 CLINICAL DATA:  Bilateral pleural effusions on chest radiograph EXAM: CT CHEST WITHOUT CONTRAST TECHNIQUE: Multidetector CT imaging of the chest was performed following the standard protocol without IV contrast. COMPARISON:  Chest radiograph dated 04/28/2018 FINDINGS: Cardiovascular: Mild cardiomegaly with left atrial enlargement. No evidence of thoracic aortic aneurysm. Atherosclerotic calcifications of the aortic arch. Three vessel coronary atherosclerosis. Mediastinum/Nodes: Mediastinal lymphadenopathy, including a dominant 2.5 cm short axis node in the low  right paratracheal region (series 3/image 60) and 1.6 cm short axis subcarinal node (series 3/image 69). Visualized thyroid is notable for an 8 mm calcified right thyroid nodule. Lungs/Pleura: 13 x 14 mm central right upper lobe nodule (series 4/image 87), suspicious for primary bronchogenic neoplasm. Additional linear/platelike opacities in the right upper lobe, nonspecific. Right lower lobe opacity favors compressive atelectasis (series 3/image 86). Additional atelectasis in the right middle lobe and left lower lobe. Moderate right and small left pleural effusions. Scattered interstitial opacities with interlobular septal thickening in the bilateral upper lobes, suggesting very mild edema. Mild to moderate centrilobular emphysematous changes, upper lobe predominant. No pneumothorax. Upper Abdomen: Visualized upper abdomen is notable for vascular calcifications. Musculoskeletal: Degenerative changes of the visualized thoracolumbar spine. IMPRESSION: 14 mm right upper lobe nodule, suspicious for primary bronchogenic neoplasm. Associated mediastinal lymphadenopathy, including a dominant 2.5 cm short axis node in the low right paratracheal region. Cardiomegaly with possible mild interstitial edema. Moderate right and small left pleural effusions. Aortic Atherosclerosis (ICD10-I70.0) and Emphysema (ICD10-J43.9). Electronically Signed   By: Julian Hy M.D.   On: 04/29/2018 11:09   Dg Chest Port 1 View  Result Date: 04/28/2018 CLINICAL DATA:  Shortness of breath. EXAM: PORTABLE CHEST 1 VIEW COMPARISON:  04/27/2018 and prior exams FINDINGS: Cardiomegaly, pulmonary vascular congestion, interstitial pulmonary edema, bilateral pleural effusions, RIGHT greater than LEFT, and bilateral LOWER lung atelectasis again noted. No pneumothorax. No significant change from the prior study. IMPRESSION: Unchanged appearance of the chest with pulmonary edema, bilateral pleural effusions, RIGHT greater than LEFT, and bibasilar  atelectasis. Electronically Signed   By: Margarette Canada M.D.   On: 04/28/2018 09:54   Dg Chest Portable 1 View  Result Date: 04/27/2018 CLINICAL DATA:  Shortness of breath. EXAM: PORTABLE CHEST 1 VIEW COMPARISON:  Radiograph March 17, 2018. FINDINGS: Stable cardiomegaly. Atherosclerosis of thoracic aorta is noted. No pneumothorax is noted. Interval development of mild to moderate bilateral pleural effusions are noted. Bilateral interstitial densities are noted which may represent edema. Bony thorax is unremarkable. IMPRESSION: Stable cardiomegaly with probable bilateral pulmonary edema and interval development of mild to moderate  bilateral pleural effusions. Electronically Signed   By: Marijo Conception, M.D.   On: 04/27/2018 13:35    Cardiac Studies   N/A  Assessment    Heart failure acute/chronic/diastolic\  Atrial fibrillation-persistent  COPD-exacerbation  Hypertension  Anemia  Renal insufficiency grade 3  Notes from 1/20 hospitalization were reviewed.  Discharge summary refers to spontaneous reversion to sinus rhythm.  CV strips were personally reviewed and confirmed this.  Hence, atrial fibrillation might revert.   Plan   We will stop adjunctive aspirin and continue Eliquis  We will add amiodarone with the hopes that she will revert to a sinus rhythm and maintained.  Otherwise, given her second hospitalization in a month, would recommend outpatient cardioversion.  Continue diuresis for 24 more hours      Virl Axe 04/29/2018, 11:21 AM

## 2018-04-29 NOTE — Procedures (Signed)
PROCEDURE SUMMARY:  Successful image-guided right thoracentesis. Yielded 1.1 liters of hazy amber fluid. Patient tolerated procedure well. No immediate complications. EBL = 0 mL.  Specimen was sent for labs. CXR ordered.  Earley Abide PA-C 04/29/2018 4:27 PM

## 2018-04-30 LAB — CBC
HCT: 32.3 % — ABNORMAL LOW (ref 36.0–46.0)
HEMOGLOBIN: 10.5 g/dL — AB (ref 12.0–15.0)
MCH: 30.5 pg (ref 26.0–34.0)
MCHC: 32.5 g/dL (ref 30.0–36.0)
MCV: 93.9 fL (ref 80.0–100.0)
Platelets: 358 10*3/uL (ref 150–400)
RBC: 3.44 MIL/uL — ABNORMAL LOW (ref 3.87–5.11)
RDW: 14.2 % (ref 11.5–15.5)
WBC: 5.3 10*3/uL (ref 4.0–10.5)
nRBC: 0 % (ref 0.0–0.2)

## 2018-04-30 LAB — ACID FAST SMEAR (AFB, MYCOBACTERIA): Acid Fast Smear: NEGATIVE

## 2018-04-30 LAB — BASIC METABOLIC PANEL
Anion gap: 4 — ABNORMAL LOW (ref 5–15)
BUN: 27 mg/dL — ABNORMAL HIGH (ref 8–23)
CHLORIDE: 96 mmol/L — AB (ref 98–111)
CO2: 29 mmol/L (ref 22–32)
Calcium: 8.7 mg/dL — ABNORMAL LOW (ref 8.9–10.3)
Creatinine, Ser: 1.21 mg/dL — ABNORMAL HIGH (ref 0.44–1.00)
GFR calc Af Amer: 43 mL/min — ABNORMAL LOW (ref >60)
GFR calc non Af Amer: 37 mL/min — ABNORMAL LOW (ref >60)
Glucose, Bld: 156 mg/dL — ABNORMAL HIGH (ref 70–99)
Potassium: 4 mmol/L (ref 3.5–5.1)
Sodium: 129 mmol/L — ABNORMAL LOW (ref 135–145)

## 2018-04-30 LAB — GRAM STAIN

## 2018-04-30 LAB — BRAIN NATRIURETIC PEPTIDE: B Natriuretic Peptide: 352 pg/mL — ABNORMAL HIGH (ref 0.0–100.0)

## 2018-04-30 LAB — HEPATIC FUNCTION PANEL
ALT: 17 U/L (ref 0–44)
AST: 20 U/L (ref 15–41)
Albumin: 2.9 g/dL — ABNORMAL LOW (ref 3.5–5.0)
Alkaline Phosphatase: 67 U/L (ref 38–126)
Bilirubin, Direct: 0.5 mg/dL — ABNORMAL HIGH (ref 0.0–0.2)
Indirect Bilirubin: 0.1 mg/dL — ABNORMAL LOW (ref 0.3–0.9)
Total Bilirubin: 0.6 mg/dL (ref 0.3–1.2)
Total Protein: 5.8 g/dL — ABNORMAL LOW (ref 6.5–8.1)

## 2018-04-30 LAB — LACTATE DEHYDROGENASE: LDH: 144 U/L (ref 98–192)

## 2018-04-30 MED ORDER — APIXABAN 2.5 MG PO TABS
2.5000 mg | ORAL_TABLET | Freq: Two times a day (BID) | ORAL | Status: DC
  Administered 2018-04-30 – 2018-05-02 (×5): 2.5 mg via ORAL
  Filled 2018-04-30 (×5): qty 1

## 2018-04-30 MED ORDER — FUROSEMIDE 10 MG/ML IJ SOLN
40.0000 mg | Freq: Once | INTRAMUSCULAR | Status: AC
  Administered 2018-04-30: 40 mg via INTRAVENOUS
  Filled 2018-04-30: qty 4

## 2018-04-30 MED ORDER — ENSURE ENLIVE PO LIQD: 237.0000 mL | Freq: Two times a day (BID) | ORAL | Status: DC

## 2018-04-30 MED ORDER — LEVOTHYROXINE SODIUM 100 MCG PO TABS
100.0000 ug | ORAL_TABLET | Freq: Every day | ORAL | Status: DC
  Administered 2018-05-01 – 2018-05-02 (×2): 100 ug via ORAL
  Filled 2018-04-30 (×2): qty 1

## 2018-04-30 MED ORDER — ALBUTEROL SULFATE (2.5 MG/3ML) 0.083% IN NEBU: 2.5000 mg | INHALATION_SOLUTION | RESPIRATORY_TRACT | Status: DC | PRN

## 2018-04-30 MED ORDER — PREDNISONE 20 MG PO TABS
40.0000 mg | ORAL_TABLET | Freq: Every day | ORAL | Status: DC
  Administered 2018-05-01 – 2018-05-02 (×2): 40 mg via ORAL
  Filled 2018-04-30 (×2): qty 2

## 2018-04-30 NOTE — Progress Notes (Signed)
RT instructed patient on the use of flutter valve and incentive spirometer.  Pt able to demonstrate back good technique and can reach 825 mL using the incentive spirometer.

## 2018-04-30 NOTE — Progress Notes (Addendum)
PROGRESS NOTE    Katherine [name]  YQM:[id number] DOB: 04-13-1917 DOA: 04/27/2018 PCP: [name], MD   Brief Narrative: Katherine [name] 85 y.o.femalewith medical history significant ofuterine cancer; CVA; Paget's carcinoma of the nipple; hypothyroidism; HTN; HLD; and COPD presenting with SOB.She woke up needing to go to the restroom and she just could not catch her breath. She was really struggling to breathe. She was previously on an antibiotic for PNA and had an xray upon completion and was diagnosed with CHF. The facility was again concerned about PNA. They started antibiotics on Tuesday, doxycycline, without improvement. She did not feel well the last night or two with SOB and difficulty sleeping. +cough, nonproductive. +LE edema, changed from Lasix to [place] after having increased the dose Lyda Colcord week or two ago. Weight had increased from 99 to 106. No fever.  She was hospitalized 1/31-2/4 for CAP; acute on chronic diastolic CHF; and new onset PAF. She was started on Eliquis, treated with Azithromycin and Vantin, discharged on 24/7 O2, and diuresed with Lasix.  ED Course:Likely new/worsening CHF. Echo in February with diastolic CHF. Has had antibiotics, yesterday with new afib, stopped Lasix and started Toresemide. In mid-80s and worse. Sent her in and on BIPAP which has now been weaned. Reasonable family, DNR and no intubation.   Assessment & Plan:   Principal Problem:   Acute on chronic diastolic (congestive) heart failure (HCC) Active Problems:   COPD (chronic obstructive pulmonary disease) (HCC)   Hypothyroidism   Hypertension   Atrial fibrillation (HCC)   CKD (chronic kidney disease) stage 3, GFR 30-59 ml/min (HCC)   Pressure injury of skin  ##Acute on Chronic hypoxic respiratory failure - Today she tells me she's on 2 L chronically - Suspect this is multifactorial in setting of R>L effusions, HF, COPD exacerbations below - wean as tolerated -  Pulmonary toilet   ##Acute on chronic congestive heart failure - Exacerbated by underlying pneumonia - Normal EF 03/2018 with grade 2 diastolic dysfunction - Will dose 40 IV lasix today x1 and watch response, continue to monitor closely - decide on additional diuresis pending her response to this - CT chest with evidence of mild interstitial edema and CXR similarly with edema - I/O, daily weights - Repeat CXR tomorrow AM  Wt Readings from Last 3 Encounters:  04/30/18 46.1 kg  04/26/18 48.1 kg  04/25/18 47.4 kg   ##COPD exacerbation -Patient was recently treated for CAP with the azithromycin, Vantin -Continue with the ceftriaxone IV -No wheezing today, transition to PO steroids - Scheduled and prn nebs -Patient states has remote history of smoking  ##Bilateral pleural effusions   Right Upper Lobe Nodule concerning for Primary bronchogenic neoplasm: - pending pleural fluid protein and serum LDH for light criteria - follow cytology, given concern for malignancy, concern for malignant effusion - follow cultures (acid fast, fungal, body fluid culture)  ##Debility -Involved physical therapy  ##Paroxysmal atrial fibrillation -Continue with the Cardizem - Appreciate cards recs - d/c aspirin, continue eliquis (looks like this was stopped yesterday evening and also missed dose this morning - likely in setting of thoracentesis yesterday - discussed with cardiology) - amiodarone has been started - Consider cardioversion prior to d/c  ##Hypothyroidism -Continue the Synthroid, TSH elevated. - will increase dose to 100 mcg and will need f/u in 4-6 weeks  ## Hyponatremia: mild, follow with diuresis above  DVT prophylaxis: eliquis Code Status: DNR Family Communication: none at bedside Disposition Plan: pending further improvement  Consultants:  cardiology  Procedures:   none  Antimicrobials:  Anti-infectives (From admission, onward)   Start     Dose/Rate Route Frequency  Ordered Stop   04/29/18 1530  cefTRIAXone (ROCEPHIN) 1 g in sodium chloride 0.9 % 100 mL IVPB     1 g 200 mL/hr over 30 Minutes Intravenous Every 24 hours 04/29/18 1436        Subjective: Feels ok.  SOB is better, but still not at baseline. At baseline, uses 2 L O2. Wasn't told that she may have cancer.  Objective: Vitals:   04/30/18 0846 04/30/18 1145 04/30/18 1159 04/30/18 1426  BP:  122/84    Pulse: 92 (!) 114 (!) 102 (!) 101  Resp: 18 18  16   Temp:  97.6 F (36.4 C)    TempSrc:  Oral    SpO2: 94% 96%  (!) 88%  Weight:      Height:        Intake/Output Summary (Last 24 hours) at 04/30/2018 1434 Last data filed at 04/30/2018 0824 Gross per 24 hour  Intake 453.16 ml  Output 250 ml  Net 203.16 ml   Filed Weights   04/28/18 0500 04/29/18 0636 04/30/18 0540  Weight: 67.1 kg 48.5 kg 46.1 kg    Examination:  General exam: Appears calm and comfortable  Respiratory system: Diffuse crackles Cardiovascular system: S1 & S2 heard, RRR.  Gastrointestinal system: Abdomen is nondistended, soft and nontender. Central nervous system: Alert and oriented. No focal neurological deficits. Extremities: trace edema Skin: No rashes, lesions or ulcers Psychiatry: Judgement and insight appear normal. Mood & affect appropriate.     Data Reviewed: I have personally reviewed following labs and imaging studies  CBC: Recent Labs  Lab 04/27/18 1123 04/28/18 0427 04/29/18 0914 04/30/18 1333  WBC 11.0* 9.0 10.1 5.3  HGB 10.2* 9.9* 10.6* 10.5*  HCT 33.1* 29.7* 32.7* 32.3*  MCV 97.1 94.0 95.6 93.9  PLT 360 346 326 254   Basic Metabolic Panel: Recent Labs  Lab 04/27/18 1123 04/28/18 0427 04/29/18 0914 04/30/18 1333  NA 132* 135 132* 129*  K 4.4 3.6 3.8 4.0  CL 98 96* 96* 96*  CO2 24 29 28 29   GLUCOSE 121* 103* 134* 156*  BUN 18 18 21  27*  CREATININE 1.23* 1.33* 1.20* 1.21*  CALCIUM 8.8* 9.2 8.8* 8.7*   GFR: Estimated Creatinine Clearance: 16.8 mL/min (Earle Burson) (by C-G formula  based on SCr of 1.21 mg/dL (H)). Liver Function Tests: Recent Labs  Lab 04/27/18 1123  AST 26  ALT 16  ALKPHOS 66  BILITOT 0.6  PROT 5.8*  ALBUMIN 3.0*   No results for input(s): LIPASE, AMYLASE in the last 168 hours. No results for input(s): AMMONIA in the last 168 hours. Coagulation Profile: No results for input(s): INR, PROTIME in the last 168 hours. Cardiac Enzymes: Recent Labs  Lab 04/27/18 1123  TROPONINI <0.03   BNP (last 3 results) No results for input(s): PROBNP in the last 8760 hours. HbA1C: No results for input(s): HGBA1C in the last 72 hours. CBG: No results for input(s): GLUCAP in the last 168 hours. Lipid Profile: No results for input(s): CHOL, HDL, LDLCALC, TRIG, CHOLHDL, LDLDIRECT in the last 72 hours. Thyroid Function Tests: Recent Labs    04/27/18 1523  TSH 10.029*  FREET4 1.63   Anemia Panel: No results for input(s): VITAMINB12, FOLATE, FERRITIN, TIBC, IRON, RETICCTPCT in the last 72 hours. Sepsis Labs: No results for input(s): PROCALCITON, LATICACIDVEN in the last 168 hours.  Recent Results (from the  past 240 hour(s))  Blood culture (routine x 2)     Status: None (Preliminary result)   Collection Time: 04/27/18 11:46 AM  Result Value Ref Range Status   Specimen Description BLOOD LEFT ANTECUBITAL  Final   Special Requests   Final    BOTTLES DRAWN AEROBIC AND ANAEROBIC Blood Culture adequate volume   Culture   Final    NO GROWTH 3 DAYS Performed at [facility], [address], [place], [place] [address]    Report Status PENDING  Incomplete  Blood culture (routine x 2)     Status: None (Preliminary result)   Collection Time: 04/27/18 11:55 AM  Result Value Ref Range Status   Specimen Description BLOOD LEFT FOREARM  Final   Special Requests   Final    BOTTLES DRAWN AEROBIC AND ANAEROBIC Blood Culture results may not be optimal due to an inadequate volume of blood received in culture bottles   Culture   Final    NO GROWTH 3  DAYS Performed at [facility], [place] [address], [place], [place] [address]    Report Status PENDING  Incomplete  MRSA PCR Screening     Status: None   Collection Time: 04/27/18  3:03 PM  Result Value Ref Range Status   MRSA by PCR NEGATIVE NEGATIVE Final    Comment:        The GeneXpert MRSA Assay (FDA approved for NASAL specimens only), is one component of Kayton Dunaj comprehensive MRSA colonization surveillance program. It is not intended to diagnose MRSA infection nor to guide or monitor treatment for MRSA infections. Performed at [facility], [place] [address], [place], [place] [address]   Culture, body fluid-bottle     Status: None (Preliminary result)   Collection Time: 04/29/18  4:15 PM  Result Value Ref Range Status   Specimen Description PLEURAL RIGHT  Final   Special Requests NONE  Final   Culture   Final    NO GROWTH < 24 HOURS Performed at [facility], [place] [address], [place], [place] [address]    Report Status PENDING  Incomplete  Gram stain     Status: None   Collection Time: 04/29/18  4:15 PM  Result Value Ref Range Status   Specimen Description PLEURAL RIGHT  Final   Special Requests NONE  Final   Gram Stain   Final    FEW WBC PRESENT, PREDOMINANTLY MONONUCLEAR NO ORGANISMS SEEN Performed at [facility], [address], [place], [place] [address]    Report Status 04/30/2018 FINAL  Final         Radiology Studies: Dg Chest 1 View  Result Date: 04/29/2018 CLINICAL DATA:  Post right-sided thoracentesis. EXAM: CHEST  1 VIEW COMPARISON:  04/28/2018; chest CT-04/29/2018 FINDINGS: Grossly unchanged enlarged cardiac silhouette and mediastinal contours with atherosclerotic plaque within thoracic aorta. There is persistent rightward deviation the tracheal air, the level of the aortic arch. There is persistent thickening the right paratracheal stripe presumably to prominent vasculature. Interval reduction in persistent small right-sided pleural  effusion post thoracentesis. No pneumothorax. Improved aeration the right lung base with persistent right basilar opacities. Unchanged small to moderate size left-sided effusion and associated left basilar heterogeneous/consolidative opacities. Pulmonary vasculature remains indistinct with cephalization of flow. Moderate scoliotic curvature of the thoracolumbar spine, incompletely evaluated. No definite acute osseous abnormalities. IMPRESSION: 1. Interval reduction in persistent small right-sided effusion post thoracentesis. No pneumothorax. 2. Improved aeration the right lung base with persistent  right basilar opacities, likely atelectasis. 3. Similar findings of pulmonary edema with small to moderate size left-sided effusion associated left basilar opacities, likely atelectasis. Electronically Signed   By: Sandi Mariscal M.D.   On: 04/29/2018 16:27   Ct Chest Wo Contrast  Result Date: 04/29/2018 CLINICAL DATA:  Bilateral pleural effusions on chest radiograph EXAM: CT CHEST WITHOUT CONTRAST TECHNIQUE: Multidetector CT imaging of the chest was performed following the standard protocol without IV contrast. COMPARISON:  Chest radiograph dated 04/28/2018 FINDINGS: Cardiovascular: Mild cardiomegaly with left atrial enlargement. No evidence of thoracic aortic aneurysm. Atherosclerotic calcifications of the aortic arch. Three vessel coronary atherosclerosis. Mediastinum/Nodes: Mediastinal lymphadenopathy, including Amy Gothard dominant 2.5 cm short axis node in the low right paratracheal region (series 3/image 60) and 1.6 cm short axis subcarinal node (series 3/image 69). Visualized thyroid is notable for an 8 mm calcified right thyroid nodule. Lungs/Pleura: 13 x 14 mm central right upper lobe nodule (series 4/image 87), suspicious for primary bronchogenic neoplasm. Additional linear/platelike opacities in the right upper lobe, nonspecific. Right lower lobe opacity favors compressive atelectasis (series 3/image 86). Additional  atelectasis in the right middle lobe and left lower lobe. Moderate right and small left pleural effusions. Scattered interstitial opacities with interlobular septal thickening in the bilateral upper lobes, suggesting very mild edema. Mild to moderate centrilobular emphysematous changes, upper lobe predominant. No pneumothorax. Upper Abdomen: Visualized upper abdomen is notable for vascular calcifications. Musculoskeletal: Degenerative changes of the visualized thoracolumbar spine. IMPRESSION: 14 mm right upper lobe nodule, suspicious for primary bronchogenic neoplasm. Associated mediastinal lymphadenopathy, including Filipe Greathouse dominant 2.5 cm short axis node in the low right paratracheal region. Cardiomegaly with possible mild interstitial edema. Moderate right and small left pleural effusions. Aortic Atherosclerosis (ICD10-I70.0) and Emphysema (ICD10-J43.9). Electronically Signed   By: Julian Hy M.D.   On: 04/29/2018 11:09   US Thoracentesis Asp Pleural Space W/img Guide  Result Date: 04/30/2018 INDICATION: Patient with history of acute on chronic diastolic HF, dyspnea, and right pleural effusion. Request made for diagnostic and therapeutic right thoracentesis. EXAM: ULTRASOUND GUIDED DIAGNOSTIC AND THERAPEUTIC RIGHT THORACENTESIS MEDICATIONS: 8 mL 1% lidocaine COMPLICATIONS: None immediate. PROCEDURE: An ultrasound guided thoracentesis was thoroughly discussed with the patient and questions answered. The benefits, risks, alternatives and complications were also discussed. The patient understands and wishes to proceed with the procedure. Written consent was obtained. Ultrasound was performed to localize and mark an adequate pocket of fluid in the right chest. The area was then prepped and draped in the normal sterile fashion. 1% Lidocaine was used for local anesthesia. Under ultrasound guidance Lubertha Leite 6 Fr Safe-T-Centesis catheter was introduced. Thoracentesis was performed. The catheter was removed and Jacobi Ryant dressing  applied. FINDINGS: Teshawn Moan total of approximately 1.1 L of hazy amber fluid was removed. Samples were sent to the laboratory as requested by the clinical team. IMPRESSION: Successful ultrasound guided right thoracentesis yielding 1.1 L of pleural fluid. Read by: Earley Abide, PA-C Electronically Signed   By: Sandi Mariscal M.D.   On: 04/29/2018 16:31        Scheduled Meds:  amiodarone  400 mg Oral BID   diltiazem  120 mg Oral Daily   fluticasone  2 spray Each Nare Daily   furosemide  40 mg Oral Daily   ipratropium-albuterol  3 mL Nebulization TID   levothyroxine  88 mcg Oral QAC breakfast   methylPREDNISolone (SOLU-MEDROL) injection  40 mg Intravenous Q6H   metoprolol tartrate  12.5 mg Oral BID   mometasone-formoterol  2 puff Inhalation BID  pantoprazole  40 mg Oral Daily   sodium chloride flush  3 mL Intravenous Q12H   Continuous Infusions:  sodium chloride Stopped (04/29/18 1912)   cefTRIAXone (ROCEPHIN)  IV Stopped (04/29/18 1757)     LOS: 3 days    Time spent: over 30 min    Fayrene Helper, MD [facility] Pager AMION  If 7PM-7AM, please contact night-coverage [email or web address] Password [facility] 04/30/2018, 2:34 PM

## 2018-04-30 NOTE — Progress Notes (Signed)
Chaplain provided prayer for this kind woman. She has long been involved in the [employer] in [place].  She is worried that she might not survive procedure on Tuesday and appears to think that her doctor has his doubts. She thinks he said "IF you come out of it" when discussing follow-up care which may be more a reflection of her anxieties than what the doctor probably said.  Faith is important to her.  She seemed to relax somewhat during the prayer.     Please do not hesitate to call Spiritual Care for follow up care, especially as the time of the procedure approaches.   Katherine [name] [phone]    04/27/18 1117  Spiritual Encounters  Spiritual Needs Prayer  Stress Factors  Patient Stress Factors Loss of control  Advance Directives (For Healthcare)  Does Patient Have a Medical Advance Directive? Yes  Type of Advance Directive Out of facility DNR (pink MOST or yellow form);Living will;Healthcare Power of [employer]

## 2018-04-30 NOTE — Plan of Care (Signed)
  Problem: Education: Goal: Knowledge of General Education information will improve Description Including pain rating scale, medication(s)/side effects and non-pharmacologic comfort measures Outcome: Progressing   Problem: Health Behavior/Discharge Planning: Goal: Ability to manage health-related needs will improve Outcome: Progressing   

## 2018-04-30 NOTE — Progress Notes (Addendum)
DAILY PROGRESS NOTE   Patient Name: Katherine [name] Date of Encounter: 04/30/2018 Cardiologist: No primary care provider on file.     Patient Profile   Katherine [name] is a 85 y.o.admitted with SOB with COPD exacerbation and acute on chronic heart failure.  Persistent atrial fibrillation noted on ECG 1/20.   Reversion to sinus noted during that hospitalization    hHx of uterine cancer, Paget's carcinoma of the nipple, CVA, hypertension, hyperlipidemia, carotid artery disease s/p left carotid endarterectomy in 2010,   Chest CT 3/20 14 mm right upper lobe nodule concerning for malignancy--Mild interstitial edema   -- Centrilobular emphysema Echo 2/20 LV function normal  Started on amio yday with the hopes of maintaining sinus if spontaneous reversion      Subjective   No chest pain or shortness of breath  Anxious to go home ( and not have to come back)   Objective   Vitals:   04/29/18 2210 04/30/18 0307 04/30/18 0540 04/30/18 0846  BP: 111/77  106/60   Pulse: (!) 139 96 (!) 106 92  Resp:   18 18  Temp:   97.8 F (36.6 C)   TempSrc:   Oral   SpO2:   95% 94%  Weight:   46.1 kg   Height:        Intake/Output Summary (Last 24 hours) at 04/30/2018 1102 Last data filed at 04/30/2018 0962 Gross per 24 hour  Intake 453.16 ml  Output 800 ml  Net -346.84 ml   Filed Weights   04/28/18 0500 04/29/18 0636 04/30/18 0540  Weight: 67.1 kg 48.5 kg 46.1 kg    Physical Exam  Well developed and nourished in no acute distress Put on high flow oxygen at 8 L HENT normal Neck supple   Clear on L crackles still on R Irregularly irregular rate and rhythm with controlled ventricular response, no murmurs or gallops Abd-soft with active BS without hepatomegaly No Clubbing cyanosis edema Skin-warm and dry A & Oriented  Grossly normal sensory and motor function    Inpatient Medications    Scheduled Meds: . amiodarone  400 mg Oral BID  . diltiazem  120 mg Oral Daily  . fluticasone   2 spray Each Nare Daily  . furosemide  40 mg Oral Daily  . ipratropium-albuterol  3 mL Nebulization TID  . levothyroxine  88 mcg Oral QAC breakfast  . methylPREDNISolone (SOLU-MEDROL) injection  40 mg Intravenous Q6H  . metoprolol tartrate  12.5 mg Oral BID  . mometasone-formoterol  2 puff Inhalation BID  . pantoprazole  40 mg Oral Daily  . sodium chloride flush  3 mL Intravenous Q12H    Continuous Infusions: . sodium chloride Stopped (04/29/18 1912)  . cefTRIAXone (ROCEPHIN)  IV Stopped (04/29/18 1757)    PRN Meds: sodium chloride, acetaminophen, albuterol, ondansetron (ZOFRAN) IV, sodium chloride flush   Labs   Results for orders placed or performed during the hospital encounter of 04/27/18 (from the past 48 hour(s))  CBC     Status: Abnormal   Collection Time: 04/29/18  9:14 AM  Result Value Ref Range   WBC 10.1 4.0 - 10.5 K/uL   RBC 3.42 (L) 3.87 - 5.11 MIL/uL   Hemoglobin 10.6 (L) 12.0 - 15.0 g/dL   HCT 32.7 (L) 36.0 - 46.0 %   MCV 95.6 80.0 - 100.0 fL   MCH 31.0 26.0 - 34.0 pg   MCHC 32.4 30.0 - 36.0 g/dL   RDW 14.3 11.5 - 15.5 %  Platelets 326 150 - 400 K/uL   nRBC 0.2 0.0 - 0.2 %    Comment: Performed at [facility], [place] [address] [place], [place] [address]  Basic metabolic panel     Status: Abnormal   Collection Time: 04/29/18  9:14 AM  Result Value Ref Range   Sodium 132 (L) 135 - 145 mmol/L   Potassium 3.8 3.5 - 5.1 mmol/L   Chloride 96 (L) 98 - 111 mmol/L   CO2 28 22 - 32 mmol/L   Glucose, Bld 134 (H) 70 - 99 mg/dL   BUN 21 8 - 23 mg/dL   Creatinine, Ser 1.20 (H) 0.44 - 1.00 mg/dL   Calcium 8.8 (L) 8.9 - 10.3 mg/dL   GFR calc non Af Amer 38 (L) >60 mL/min   GFR calc Af Amer 44 (L) >60 mL/min   Anion gap 8 5 - 15    Comment: Performed at [facility] [address], [place], [place] [address]  Lactate dehydrogenase (pleural or peritoneal fluid)     Status: Abnormal   Collection Time: 04/29/18  4:15 PM  Result Value Ref Range   LD,  Fluid 66 (H) 3 - 23 U/L    Comment: (NOTE) Results should be evaluated in conjunction with serum values    Fluid Type-FLDH PLEU RIGHT     Comment: Performed at [facility] [address], [place], [place] [address] CORRECTED ON 03/14 AT 1627: PREVIOUSLY REPORTED AS THORACENTESIS   Body fluid cell count with differential     Status: Abnormal   Collection Time: 04/29/18  4:15 PM  Result Value Ref Range   Fluid Type-FCT PLEU RIGHT     Comment: CORRECTED ON 03/14 AT 1627: PREVIOUSLY REPORTED AS THORACENTESIS   Color, Fluid YELLOW YELLOW   Appearance, Fluid CLOUDY (A) CLEAR   WBC, Fluid 77 0 - 1,000 cu mm   Neutrophil Count, Fluid 5 0 - 25 %   Lymphs, Fluid 84 %   Monocyte-Macrophage-Serous Fluid 11 (L) 50 - 90 %    Comment: Performed at [facility], [place] [address] [place], [place] [address]  Glucose, pleural or peritoneal fluid     Status: None   Collection Time: 04/29/18  4:15 PM  Result Value Ref Range   Glucose, Fluid 123 mg/dL    Comment: (NOTE) No normal range established for this test Results should be evaluated in conjunction with serum values    Fluid Type-FGLU PLEU RIGHT     Comment: Performed at [facility] [address], [place], [place] [address] CORRECTED ON 03/14 AT 1627: PREVIOUSLY REPORTED AS THORACENTESIS   Gram stain     Status: None   Collection Time: 04/29/18  4:15 PM  Result Value Ref Range   Specimen Description PLEURAL RIGHT    Special Requests NONE    Gram Stain      FEW WBC PRESENT, PREDOMINANTLY MONONUCLEAR NO ORGANISMS SEEN Performed at [facility], [place] [address], [place], [place] [address]    Report Status 04/30/2018 FINAL     ECG  Atrial fibrillation noted on all tracings since 1/20 but not in 2013  Telemetry   Telemetry Personally reviewed  A fib with HR slowing from avg 120's >> 90-100's  Radiology    Dg Chest 1 View  Result Date: 04/29/2018 CLINICAL DATA:  Post right-sided thoracentesis. EXAM: CHEST   1 VIEW COMPARISON:  04/28/2018; chest CT-04/29/2018 FINDINGS: Grossly unchanged enlarged cardiac silhouette  and mediastinal contours with atherosclerotic plaque within thoracic aorta. There is persistent rightward deviation the tracheal air, the level of the aortic arch. There is persistent thickening the right paratracheal stripe presumably to prominent vasculature. Interval reduction in persistent small right-sided pleural effusion post thoracentesis. No pneumothorax. Improved aeration the right lung base with persistent right basilar opacities. Unchanged small to moderate size left-sided effusion and associated left basilar heterogeneous/consolidative opacities. Pulmonary vasculature remains indistinct with cephalization of flow. Moderate scoliotic curvature of the thoracolumbar spine, incompletely evaluated. No definite acute osseous abnormalities. IMPRESSION: 1. Interval reduction in persistent small right-sided effusion post thoracentesis. No pneumothorax. 2. Improved aeration the right lung base with persistent right basilar opacities, likely atelectasis. 3. Similar findings of pulmonary edema with small to moderate size left-sided effusion associated left basilar opacities, likely atelectasis. Electronically Signed   By: Sandi Mariscal M.D.   On: 04/29/2018 16:27   Ct Chest Wo Contrast  Result Date: 04/29/2018 CLINICAL DATA:  Bilateral pleural effusions on chest radiograph EXAM: CT CHEST WITHOUT CONTRAST TECHNIQUE: Multidetector CT imaging of the chest was performed following the standard protocol without IV contrast. COMPARISON:  Chest radiograph dated 04/28/2018 FINDINGS: Cardiovascular: Mild cardiomegaly with left atrial enlargement. No evidence of thoracic aortic aneurysm. Atherosclerotic calcifications of the aortic arch. Three vessel coronary atherosclerosis. Mediastinum/Nodes: Mediastinal lymphadenopathy, including a dominant 2.5 cm short axis node in the low right paratracheal region (series 3/image  60) and 1.6 cm short axis subcarinal node (series 3/image 69). Visualized thyroid is notable for an 8 mm calcified right thyroid nodule. Lungs/Pleura: 13 x 14 mm central right upper lobe nodule (series 4/image 87), suspicious for primary bronchogenic neoplasm. Additional linear/platelike opacities in the right upper lobe, nonspecific. Right lower lobe opacity favors compressive atelectasis (series 3/image 86). Additional atelectasis in the right middle lobe and left lower lobe. Moderate right and small left pleural effusions. Scattered interstitial opacities with interlobular septal thickening in the bilateral upper lobes, suggesting very mild edema. Mild to moderate centrilobular emphysematous changes, upper lobe predominant. No pneumothorax. Upper Abdomen: Visualized upper abdomen is notable for vascular calcifications. Musculoskeletal: Degenerative changes of the visualized thoracolumbar spine. IMPRESSION: 14 mm right upper lobe nodule, suspicious for primary bronchogenic neoplasm. Associated mediastinal lymphadenopathy, including a dominant 2.5 cm short axis node in the low right paratracheal region. Cardiomegaly with possible mild interstitial edema. Moderate right and small left pleural effusions. Aortic Atherosclerosis (ICD10-I70.0) and Emphysema (ICD10-J43.9). Electronically Signed   By: Julian Hy M.D.   On: 04/29/2018 11:09   US Thoracentesis Asp Pleural Space W/img Guide  Result Date: 04/30/2018 INDICATION: Patient with history of acute on chronic diastolic HF, dyspnea, and right pleural effusion. Request made for diagnostic and therapeutic right thoracentesis. EXAM: ULTRASOUND GUIDED DIAGNOSTIC AND THERAPEUTIC RIGHT THORACENTESIS MEDICATIONS: 8 mL 1% lidocaine COMPLICATIONS: None immediate. PROCEDURE: An ultrasound guided thoracentesis was thoroughly discussed with the patient and questions answered. The benefits, risks, alternatives and complications were also discussed. The patient  understands and wishes to proceed with the procedure. Written consent was obtained. Ultrasound was performed to localize and mark an adequate pocket of fluid in the right chest. The area was then prepped and draped in the normal sterile fashion. 1% Lidocaine was used for local anesthesia. Under ultrasound guidance a 6 Fr Safe-T-Centesis catheter was introduced. Thoracentesis was performed. The catheter was removed and a dressing applied. FINDINGS: A total of approximately 1.1 L of hazy amber fluid was removed. Samples were sent to the laboratory as requested by the clinical team. IMPRESSION:  Successful ultrasound guided right thoracentesis yielding 1.1 L of pleural fluid. Read by: Earley Abide, PA-C Electronically Signed   By: Sandi Mariscal M.D.   On: 04/29/2018 16:31    Cardiac Studies   N/A  Assessment    Heart failure acute/chronic/diastolic\  Atrial fibrillation-persistent  COPD-exacerbation  Hypertension  Anemia  Renal insufficiency grade 3  Lung Ca probable-- cytology pending  Dyspnea multifactorial   Notes from 1/20 hospitalization were reviewed.  Discharge summary refers to spontaneous reversion to sinus rhythm.  CV strips were personally reviewed and confirmed this.  Hence, atrial fibrillation might revert.   Plan   Continue amiodarone.  Tolerating.  Temporally associated with slowing of her heart rate.  Presumably affected in that regard.  I think this affords her best opportunity of not being rehospitalized for heart failure.  With her elevated TSH, and normal T4 we will check her T3  Transaminases were normal prior to initiation of amiodarone  Have discussed with her cardioversion prior to discharge, this could possibly be done on Tuesday if she does not convert spontaneously before then.  I just received a call from Dr. Florene Glen who notes that inadvertently, 2 doses of Eliquis were held; hence, cardioversion is not an option.  Will repeat BNP-- and he is going to  [employer] 04/30/2018, 11:02 AM

## 2018-05-01 ENCOUNTER — Inpatient Hospital Stay (HOSPITAL_COMMUNITY): Payer: Medicare Other

## 2018-05-01 DIAGNOSIS — J440 Chronic obstructive pulmonary disease with acute lower respiratory infection: Secondary | ICD-10-CM

## 2018-05-01 DIAGNOSIS — J9621 Acute and chronic respiratory failure with hypoxia: Secondary | ICD-10-CM

## 2018-05-01 LAB — CBC
HCT: 32.7 % — ABNORMAL LOW (ref 36.0–46.0)
Hemoglobin: 10.7 g/dL — ABNORMAL LOW (ref 12.0–15.0)
MCH: 30.7 pg (ref 26.0–34.0)
MCHC: 32.7 g/dL (ref 30.0–36.0)
MCV: 94 fL (ref 80.0–100.0)
NRBC: 0 % (ref 0.0–0.2)
Platelets: 371 10*3/uL (ref 150–400)
RBC: 3.48 MIL/uL — ABNORMAL LOW (ref 3.87–5.11)
RDW: 14.4 % (ref 11.5–15.5)
WBC: 10.2 10*3/uL (ref 4.0–10.5)

## 2018-05-01 LAB — COMPREHENSIVE METABOLIC PANEL
ALT: 19 U/L (ref 0–44)
AST: 19 U/L (ref 15–41)
Albumin: 3 g/dL — ABNORMAL LOW (ref 3.5–5.0)
Alkaline Phosphatase: 62 U/L (ref 38–126)
Anion gap: 10 (ref 5–15)
BUN: 34 mg/dL — ABNORMAL HIGH (ref 8–23)
CHLORIDE: 95 mmol/L — AB (ref 98–111)
CO2: 29 mmol/L (ref 22–32)
CREATININE: 1.39 mg/dL — AB (ref 0.44–1.00)
Calcium: 8.9 mg/dL (ref 8.9–10.3)
GFR calc Af Amer: 36 mL/min — ABNORMAL LOW (ref >60)
GFR calc non Af Amer: 31 mL/min — ABNORMAL LOW (ref >60)
Glucose, Bld: 140 mg/dL — ABNORMAL HIGH (ref 70–99)
POTASSIUM: 3.9 mmol/L (ref 3.5–5.1)
Sodium: 134 mmol/L — ABNORMAL LOW (ref 135–145)
Total Bilirubin: 0.5 mg/dL (ref 0.3–1.2)
Total Protein: 6.3 g/dL — ABNORMAL LOW (ref 6.5–8.1)

## 2018-05-01 LAB — PROTEIN, PLEURAL OR PERITONEAL FLUID: Total protein, fluid: <3 g/dL

## 2018-05-01 LAB — PH, BODY FLUID: pH, Body Fluid: 7.8

## 2018-05-01 LAB — MAGNESIUM: Magnesium: 2 mg/dL (ref 1.7–2.4)

## 2018-05-01 MED ORDER — CARBAMIDE PEROXIDE 6.5 % OT SOLN
5.0000 [drp] | Freq: Every day | OTIC | Status: DC | PRN
  Administered 2018-05-01 – 2018-05-02 (×2): 5 [drp] via OTIC
  Filled 2018-05-01: qty 15

## 2018-05-01 MED ORDER — FUROSEMIDE 40 MG PO TABS
40.0000 mg | ORAL_TABLET | Freq: Every day | ORAL | Status: DC
  Administered 2018-05-01 – 2018-05-02 (×2): 40 mg via ORAL
  Filled 2018-05-01 (×2): qty 1

## 2018-05-01 MED ORDER — POLYETHYLENE GLYCOL 3350 17 G PO PACK
17.0000 g | PACK | Freq: Every day | ORAL | Status: DC
  Administered 2018-05-01: 17 g via ORAL
  Filled 2018-05-01 (×2): qty 1

## 2018-05-01 NOTE — Progress Notes (Signed)
SATURATION QUALIFICATIONS: (This note is used to comply with regulatory documentation for home oxygen)  Patient Saturations on Room Air at Rest = 85%  Patient Saturations on Room Air while Ambulating = NT as pt desat at rest on RA  Patient Saturations on 3 Liters of oxygen while Ambulating = 90%  Please briefly explain why patient needs home oxygen:Pt needed 3LO2 to keep sats >90% with activity.   [place] Pager:  [phone]  Office:  [phone]

## 2018-05-01 NOTE — Progress Notes (Signed)
PROGRESS NOTE    BEAU VANDUZER  URK:[id number] DOB: 1917/12/01 DOA: 04/27/2018 PCP: [name], MD   Brief Narrative: Katherine [name] Cardin Nitschke 85 y.o.femalewith medical history significant ofuterine cancer; CVA; Paget's carcinoma of the nipple; hypothyroidism; HTN; HLD; and COPD presenting with SOB.She woke up needing to go to the restroom and she just could not catch her breath. She was really struggling to breathe. She was previously on an antibiotic for PNA and had an xray upon completion and was diagnosed with CHF. The facility was again concerned about PNA. They started antibiotics on Tuesday, doxycycline, without improvement. She did not feel well the last night or two with SOB and difficulty sleeping. +cough, nonproductive. +LE edema, changed from Lasix to [place] after having increased the dose Emalina Dubreuil week or two ago. Weight had increased from 99 to 106. No fever.  She was hospitalized 1/31-2/4 for CAP; acute on chronic diastolic CHF; and new onset PAF. She was started on Eliquis, treated with Azithromycin and Vantin, discharged on 24/7 O2, and diuresed with Lasix.  ED Course:Likely new/worsening CHF. Echo in February with diastolic CHF. Has had antibiotics, yesterday with new afib, stopped Lasix and started Toresemide. In mid-80s and worse. Sent her in and on BIPAP which has now been weaned. Reasonable family, DNR and no intubation.   Assessment & Plan:   Principal Problem:   Acute on chronic diastolic (congestive) heart failure (HCC) Active Problems:   COPD (chronic obstructive pulmonary disease) (HCC)   Hypothyroidism   Hypertension   Atrial fibrillation (HCC)   CKD (chronic kidney disease) stage 3, GFR 30-59 ml/min (HCC)   Pressure injury of skin  ##Acute on Chronic hypoxic respiratory failure - Today she tells me she's on 2 L chronically - Suspect this is multifactorial in setting of R>L effusions, HF, COPD exacerbations below - Repeat cxr 3/16 with  increased patchy densities in R lung concerning for PNA as well as slightly increased R pleural fluid. - wean as tolerated - Pulmonary toilet   ##Acute on chronic congestive heart failure - Normal EF 03/2018 with grade 2 diastolic dysfunction - Transition to PO lasix today - CT chest with evidence of mild interstitial edema and CXR similarly with edema - I/O, daily weights  Wt Readings from Last 3 Encounters:  05/01/18 46 kg  04/26/18 48.1 kg  04/25/18 47.4 kg   ##COPD exacerbation -Patient was recently treated for CAP with the azithromycin, Vantin -Continue with ceftriaxone IV -No wheezing today, continue PO steroids - Scheduled and prn nebs -Patient states has remote history of smoking  ##Bilateral pleural effusions   Right Upper Lobe Nodule concerning for Primary bronchogenic neoplasm   Concern for Pneumonia: - pending pleural fluid protein and serum LDH for light criteria - follow cytology, given concern for malignancy, concern for malignant effusion - follow cultures (acid fast smear negative, acid fast cx, fungal, body fluid culture - NGTD x2) - on abx as noted above, she's afebrile, normal WBC count  ##Debility -Involved physical therapy  ##Paroxysmal atrial fibrillation -Continue with the Cardizem - Appreciate cards recs - d/c aspirin, continue eliquis  - plan for DCCV at 30 days  - amiodarone has been started - Consider cardioversion prior to d/c  ##Hypothyroidism -Continue the Synthroid, TSH elevated. - will increase dose to 100 mcg and will need f/u in 4-6 weeks  ## Hyponatremia: improved with diuresis  # Elevated Creatinine: after diuresis, follow   DVT prophylaxis: eliquis Code Status: DNR Family Communication: none at bedside Disposition  Plan: pending further improvement  Consultants:   cardiology  Procedures:   none  Antimicrobials:  Anti-infectives (From admission, onward)   Start     Dose/Rate Route Frequency Ordered Stop   04/29/18  1530  cefTRIAXone (ROCEPHIN) 1 g in sodium chloride 0.9 % 100 mL IVPB     1 g 200 mL/hr over 30 Minutes Intravenous Every 24 hours 04/29/18 1436        Subjective: Feels ok.  SOB better, not quite to baseline. Still on significant amount of O2 this AM.  Objective: Vitals:   05/01/18 0909 05/01/18 0915 05/01/18 0929 05/01/18 0938  BP:   103/61   Pulse:   (!) 57   Resp:      Temp:      TempSrc:      SpO2: 94% 97% 96% 95%  Weight:      Height:        Intake/Output Summary (Last 24 hours) at 05/01/2018 1000 Last data filed at 05/01/2018 0943 Gross per 24 hour  Intake 1423 ml  Output 1450 ml  Net -27 ml   Filed Weights   04/29/18 0636 04/30/18 0540 05/01/18 0520  Weight: 48.5 kg 46.1 kg 46 kg    Examination:  General: No acute distress. HEENT: some cerumen on R, normal appearing TM's bilaterally  Cardiovascular: irregular rate Lungs: some scattered crackles/rhonchi R>L Abdomen: Soft, nontender, nondistended  Neurological: Alert and oriented 3. Moves all extremities 4. Cranial nerves II through XII grossly intact. Skin: Warm and dry. No rashes or lesions. Extremities: No clubbing or cyanosis. No edema.  Psychiatric: Mood and affect are normal. Insight and judgment are appropriate.    Data Reviewed: I have personally reviewed following labs and imaging studies  CBC: Recent Labs  Lab 04/27/18 1123 04/28/18 0427 04/29/18 0914 04/30/18 1333 05/01/18 0545  WBC 11.0* 9.0 10.1 5.3 10.2  HGB 10.2* 9.9* 10.6* 10.5* 10.7*  HCT 33.1* 29.7* 32.7* 32.3* 32.7*  MCV 97.1 94.0 95.6 93.9 94.0  PLT 360 346 326 358 697   Basic Metabolic Panel: Recent Labs  Lab 04/27/18 1123 04/28/18 0427 04/29/18 0914 04/30/18 1333 05/01/18 0545  NA 132* 135 132* 129* 134*  K 4.4 3.6 3.8 4.0 3.9  CL 98 96* 96* 96* 95*  CO2 24 29 28 29 29   GLUCOSE 121* 103* 134* 156* 140*  BUN 18 18 21  27* 34*  CREATININE 1.23* 1.33* 1.20* 1.21* 1.39*  CALCIUM 8.8* 9.2 8.8* 8.7* 8.9  MG  --   --    --   --  2.0   GFR: Estimated Creatinine Clearance: 14.6 mL/min (Kamarion Zagami) (by C-G formula based on SCr of 1.39 mg/dL (H)). Liver Function Tests: Recent Labs  Lab 04/27/18 1123 04/30/18 1333 05/01/18 0545  AST 26 20 19   ALT 16 17 19   ALKPHOS 66 67 62  BILITOT 0.6 0.6 0.5  PROT 5.8* 5.8* 6.3*  ALBUMIN 3.0* 2.9* 3.0*   No results for input(s): LIPASE, AMYLASE in the last 168 hours. No results for input(s): AMMONIA in the last 168 hours. Coagulation Profile: No results for input(s): INR, PROTIME in the last 168 hours. Cardiac Enzymes: Recent Labs  Lab 04/27/18 1123  TROPONINI <0.03   BNP (last 3 results) No results for input(s): PROBNP in the last 8760 hours. HbA1C: No results for input(s): HGBA1C in the last 72 hours. CBG: No results for input(s): GLUCAP in the last 168 hours. Lipid Profile: No results for input(s): CHOL, HDL, LDLCALC, TRIG, CHOLHDL, LDLDIRECT in the  last 72 hours. Thyroid Function Tests: No results for input(s): TSH, T4TOTAL, FREET4, T3FREE, THYROIDAB in the last 72 hours. Anemia Panel: No results for input(s): VITAMINB12, FOLATE, FERRITIN, TIBC, IRON, RETICCTPCT in the last 72 hours. Sepsis Labs: No results for input(s): PROCALCITON, LATICACIDVEN in the last 168 hours.  Recent Results (from the past 240 hour(s))  Blood culture (routine x 2)     Status: None (Preliminary result)   Collection Time: 04/27/18 11:46 AM  Result Value Ref Range Status   Specimen Description BLOOD LEFT ANTECUBITAL  Final   Special Requests   Final    BOTTLES DRAWN AEROBIC AND ANAEROBIC Blood Culture adequate volume   Culture   Final    NO GROWTH 4 DAYS Performed at [facility], [address], [place], [place] [address]    Report Status PENDING  Incomplete  Blood culture (routine x 2)     Status: None (Preliminary result)   Collection Time: 04/27/18 11:55 AM  Result Value Ref Range Status   Specimen Description BLOOD LEFT FOREARM  Final   Special Requests   Final      BOTTLES DRAWN AEROBIC AND ANAEROBIC Blood Culture results may not be optimal due to an inadequate volume of blood received in culture bottles   Culture   Final    NO GROWTH 4 DAYS Performed at [facility], [place] [address] [place], [place] [address]    Report Status PENDING  Incomplete  MRSA PCR Screening     Status: None   Collection Time: 04/27/18  3:03 PM  Result Value Ref Range Status   MRSA by PCR NEGATIVE NEGATIVE Final    Comment:        The GeneXpert MRSA Assay (FDA approved for NASAL specimens only), is one component of Mayra Jolliffe comprehensive MRSA colonization surveillance program. It is not intended to diagnose MRSA infection nor to guide or monitor treatment for MRSA infections. Performed at [facility], [place] [address] [place], [place] [address]   Acid Fast Smear (AFB)     Status: None   Collection Time: 04/29/18  4:15 PM  Result Value Ref Range Status   AFB Specimen Processing Concentration  Final   Acid Fast Smear Negative  Final    Comment: (NOTE) Performed At: [facility] [place], [place] [id number] Rush Farmer MD IO:[id number]    Source (AFB) PLEURAL  Final    Comment: RIGHT Performed at [facility], [place] [address], [place], [place] [address]   Culture, body fluid-bottle     Status: None (Preliminary result)   Collection Time: 04/29/18  4:15 PM  Result Value Ref Range Status   Specimen Description PLEURAL RIGHT  Final   Special Requests NONE  Final   Culture   Final    NO GROWTH 2 DAYS Performed at [facility], [place] [address] [place], [place] [address]    Report Status PENDING  Incomplete  Gram stain     Status: None   Collection Time: 04/29/18  4:15 PM  Result Value Ref Range Status   Specimen Description PLEURAL RIGHT  Final   Special Requests NONE  Final   Gram Stain   Final    FEW WBC PRESENT, PREDOMINANTLY MONONUCLEAR NO ORGANISMS SEEN Performed at [facility], [address],  [place], [place] [address]    Report Status 04/30/2018 FINAL  Final         Radiology Studies: Dg Chest  1 View  Result Date: 04/29/2018 CLINICAL DATA:  Post right-sided thoracentesis. EXAM: CHEST  1 VIEW COMPARISON:  04/28/2018; chest CT-04/29/2018 FINDINGS: Grossly unchanged enlarged cardiac silhouette and mediastinal contours with atherosclerotic plaque within thoracic aorta. There is persistent rightward deviation the tracheal air, the level of the aortic arch. There is persistent thickening the right paratracheal stripe presumably to prominent vasculature. Interval reduction in persistent small right-sided pleural effusion post thoracentesis. No pneumothorax. Improved aeration the right lung base with persistent right basilar opacities. Unchanged small to moderate size left-sided effusion and associated left basilar heterogeneous/consolidative opacities. Pulmonary vasculature remains indistinct with cephalization of flow. Moderate scoliotic curvature of the thoracolumbar spine, incompletely evaluated. No definite acute osseous abnormalities. IMPRESSION: 1. Interval reduction in persistent small right-sided effusion post thoracentesis. No pneumothorax. 2. Improved aeration the right lung base with persistent right basilar opacities, likely atelectasis. 3. Similar findings of pulmonary edema with small to moderate size left-sided effusion associated left basilar opacities, likely atelectasis. Electronically Signed   By: Sandi Mariscal M.D.   On: 04/29/2018 16:27   Dg Chest 2 View  Result Date: 05/01/2018 CLINICAL DATA:  Hypoxia. EXAM: CHEST - 2 VIEW COMPARISON:  04/29/2018 FINDINGS: Improved aeration in the left lower chest is suggestive for decreasing pleural fluid. Cardiac silhouette remains enlarged. Right pleural effusion may slightly enlarged. Again noted are patchy densities in the right mid and upper lung. Severe kyphotic deformity near the thoracolumbar spine with probable compression deformity.  Difficult to evaluate for interval change in the spine. IMPRESSION: Slightly increased patchy densities in the right lung are worrisome for pneumonia. Slightly increased right pleural fluid. Improved aeration in the left lower chest may represent decreased left pleural effusion. Electronically Signed   By: Markus Daft M.D.   On: 05/01/2018 08:21   Ct Chest Wo Contrast  Result Date: 04/29/2018 CLINICAL DATA:  Bilateral pleural effusions on chest radiograph EXAM: CT CHEST WITHOUT CONTRAST TECHNIQUE: Multidetector CT imaging of the chest was performed following the standard protocol without IV contrast. COMPARISON:  Chest radiograph dated 04/28/2018 FINDINGS: Cardiovascular: Mild cardiomegaly with left atrial enlargement. No evidence of thoracic aortic aneurysm. Atherosclerotic calcifications of the aortic arch. Three vessel coronary atherosclerosis. Mediastinum/Nodes: Mediastinal lymphadenopathy, including Christabelle Hanzlik dominant 2.5 cm short axis node in the low right paratracheal region (series 3/image 60) and 1.6 cm short axis subcarinal node (series 3/image 69). Visualized thyroid is notable for an 8 mm calcified right thyroid nodule. Lungs/Pleura: 13 x 14 mm central right upper lobe nodule (series 4/image 87), suspicious for primary bronchogenic neoplasm. Additional linear/platelike opacities in the right upper lobe, nonspecific. Right lower lobe opacity favors compressive atelectasis (series 3/image 86). Additional atelectasis in the right middle lobe and left lower lobe. Moderate right and small left pleural effusions. Scattered interstitial opacities with interlobular septal thickening in the bilateral upper lobes, suggesting very mild edema. Mild to moderate centrilobular emphysematous changes, upper lobe predominant. No pneumothorax. Upper Abdomen: Visualized upper abdomen is notable for vascular calcifications. Musculoskeletal: Degenerative changes of the visualized thoracolumbar spine. IMPRESSION: 14 mm right upper  lobe nodule, suspicious for primary bronchogenic neoplasm. Associated mediastinal lymphadenopathy, including Tara Wich dominant 2.5 cm short axis node in the low right paratracheal region. Cardiomegaly with possible mild interstitial edema. Moderate right and small left pleural effusions. Aortic Atherosclerosis (ICD10-I70.0) and Emphysema (ICD10-J43.9). Electronically Signed   By: Julian Hy M.D.   On: 04/29/2018 11:09   US Thoracentesis Asp Pleural Space W/img Guide  Result Date: 04/30/2018 INDICATION: Patient with history of acute on  chronic diastolic HF, dyspnea, and right pleural effusion. Request made for diagnostic and therapeutic right thoracentesis. EXAM: ULTRASOUND GUIDED DIAGNOSTIC AND THERAPEUTIC RIGHT THORACENTESIS MEDICATIONS: 8 mL 1% lidocaine COMPLICATIONS: None immediate. PROCEDURE: An ultrasound guided thoracentesis was thoroughly discussed with the patient and questions answered. The benefits, risks, alternatives and complications were also discussed. The patient understands and wishes to proceed with the procedure. Written consent was obtained. Ultrasound was performed to localize and mark an adequate pocket of fluid in the right chest. The area was then prepped and draped in the normal sterile fashion. 1% Lidocaine was used for local anesthesia. Under ultrasound guidance Navjot Loera 6 Fr Safe-T-Centesis catheter was introduced. Thoracentesis was performed. The catheter was removed and Matej Sappenfield dressing applied. FINDINGS: Jonanthan Bolender total of approximately 1.1 L of hazy amber fluid was removed. Samples were sent to the laboratory as requested by the clinical team. IMPRESSION: Successful ultrasound guided right thoracentesis yielding 1.1 L of pleural fluid. Read by: Earley Abide, PA-C Electronically Signed   By: Sandi Mariscal M.D.   On: 04/29/2018 16:31        Scheduled Meds:  amiodarone  400 mg Oral BID   apixaban  2.5 mg Oral BID   diltiazem  120 mg Oral Daily   feeding supplement (ENSURE ENLIVE)  237 mL  Oral BID BM   fluticasone  2 spray Each Nare Daily   furosemide  40 mg Oral Daily   ipratropium-albuterol  3 mL Nebulization TID   levothyroxine  100 mcg Oral Q0600   metoprolol tartrate  12.5 mg Oral BID   mometasone-formoterol  2 puff Inhalation BID   pantoprazole  40 mg Oral Daily   predniSONE  40 mg Oral Q breakfast   sodium chloride flush  3 mL Intravenous Q12H   Continuous Infusions:  sodium chloride Stopped (04/29/18 1912)   cefTRIAXone (ROCEPHIN)  IV Stopped (04/30/18 1535)     LOS: 4 days    Time spent: over 38 min    Fayrene Helper, MD [facility] Pager AMION  If 7PM-7AM, please contact night-coverage [email or web address] Password [email or web address] 05/01/2018, 10:00 AM

## 2018-05-01 NOTE — Progress Notes (Signed)
Progress Note  Patient Name: Katherine [name] Date of Encounter: 05/01/2018  Primary Cardiologist: No primary care provider on file. new ([facility])  Subjective   Feels better, breathing close to baseline. >1.3 L net diuresis documented, but notes report additional 2l diuresis in ED. Weight down 5 lb at 101 lb (she believes her recent baseline is 99 lb). Hyponatremia resolved with diuresis, very slight uptick in creatinine.  Inpatient Medications    Scheduled Meds:  amiodarone  400 mg Oral BID   apixaban  2.5 mg Oral BID   diltiazem  120 mg Oral Daily   feeding supplement (ENSURE ENLIVE)  237 mL Oral BID BM   fluticasone  2 spray Each Nare Daily   ipratropium-albuterol  3 mL Nebulization TID   levothyroxine  100 mcg Oral Q0600   metoprolol tartrate  12.5 mg Oral BID   mometasone-formoterol  2 puff Inhalation BID   pantoprazole  40 mg Oral Daily   predniSONE  40 mg Oral Q breakfast   sodium chloride flush  3 mL Intravenous Q12H   Continuous Infusions:  sodium chloride Stopped (04/29/18 1912)   cefTRIAXone (ROCEPHIN)  IV Stopped (04/30/18 1535)   PRN Meds: sodium chloride, acetaminophen, albuterol, ondansetron (ZOFRAN) IV, sodium chloride flush   Vital Signs    Vitals:   04/30/18 1915 04/30/18 1956 04/30/18 2248 05/01/18 0520  BP:  (!) 104/59  (!) 106/59  Pulse:  (!) 121 (!) 106 75  Resp:  18  19  Temp:  98.2 F (36.8 C)  98.3 F (36.8 C)  TempSrc:  Oral  Oral  SpO2: 93% 97%  97%  Weight:    46 kg  Height:        Intake/Output Summary (Last 24 hours) at 05/01/2018 0909 Last data filed at 05/01/2018 0820 Gross per 24 hour  Intake 940 ml  Output 1450 ml  Net -510 ml   Last 3 Weights 05/01/2018 04/30/2018 04/29/2018  Weight (lbs) 101 lb 8 oz 101 lb 9.6 oz 106 lb 14.4 oz  Weight (kg) 46.04 kg 46.085 kg 48.49 kg      Telemetry    AFib , rate controlled - Personally Reviewed  ECG    No new tracing - Personally Reviewed  Physical Exam  Very small  body habitus, borderline cachectic GEN: No acute distress.   Neck: 4-5 cm JVD Cardiac: irregular, no murmurs, rubs, or gallops.  Respiratory: Clear to auscultation bilaterally. GI: Soft, nontender, non-distended  MS: No edema; No deformity. Neuro:  Nonfocal  Psych: Normal affect   Labs    Chemistry Recent Labs  Lab 04/27/18 1123  04/29/18 0914 04/30/18 1333 05/01/18 0545  NA 132*   < > 132* 129* 134*  K 4.4   < > 3.8 4.0 3.9  CL 98   < > 96* 96* 95*  CO2 24   < > 28 29 29   GLUCOSE 121*   < > 134* 156* 140*  BUN 18   < > 21 27* 34*  CREATININE 1.23*   < > 1.20* 1.21* 1.39*  CALCIUM 8.8*   < > 8.8* 8.7* 8.9  PROT 5.8*  --   --  5.8* 6.3*  ALBUMIN 3.0*  --   --  2.9* 3.0*  AST 26  --   --  20 19  ALT 16  --   --  17 19  ALKPHOS 66  --   --  67 62  BILITOT 0.6  --   --  0.6 0.5  GFRNONAA 36*   < > 38* 37* 31*  GFRAA 42*   < > 44* 43* 36*  ANIONGAP 10   < > 8 4* 10   < > = values in this interval not displayed.     Hematology Recent Labs  Lab 04/29/18 0914 04/30/18 1333 05/01/18 0545  WBC 10.1 5.3 10.2  RBC 3.42* 3.44* 3.48*  HGB 10.6* 10.5* 10.7*  HCT 32.7* 32.3* 32.7*  MCV 95.6 93.9 94.0  MCH 31.0 30.5 30.7  MCHC 32.4 32.5 32.7  RDW 14.3 14.2 14.4  PLT 326 358 371    Cardiac Enzymes Recent Labs  Lab 04/27/18 1123  TROPONINI <0.03   No results for input(s): TROPIPOC in the last 168 hours.   BNP Recent Labs  Lab 04/27/18 1123 04/30/18 1333  BNP 289.4* 352.0*     DDimer No results for input(s): DDIMER in the last 168 hours.   Radiology    Dg Chest 1 View  Result Date: 04/29/2018 CLINICAL DATA:  Post right-sided thoracentesis. EXAM: CHEST  1 VIEW COMPARISON:  04/28/2018; chest CT-04/29/2018 FINDINGS: Grossly unchanged enlarged cardiac silhouette and mediastinal contours with atherosclerotic plaque within thoracic aorta. There is persistent rightward deviation the tracheal air, the level of the aortic arch. There is persistent thickening the right  paratracheal stripe presumably to prominent vasculature. Interval reduction in persistent small right-sided pleural effusion post thoracentesis. No pneumothorax. Improved aeration the right lung base with persistent right basilar opacities. Unchanged small to moderate size left-sided effusion and associated left basilar heterogeneous/consolidative opacities. Pulmonary vasculature remains indistinct with cephalization of flow. Moderate scoliotic curvature of the thoracolumbar spine, incompletely evaluated. No definite acute osseous abnormalities. IMPRESSION: 1. Interval reduction in persistent small right-sided effusion post thoracentesis. No pneumothorax. 2. Improved aeration the right lung base with persistent right basilar opacities, likely atelectasis. 3. Similar findings of pulmonary edema with small to moderate size left-sided effusion associated left basilar opacities, likely atelectasis. Electronically Signed   By: Sandi Mariscal M.D.   On: 04/29/2018 16:27   Dg Chest 2 View  Result Date: 05/01/2018 CLINICAL DATA:  Hypoxia. EXAM: CHEST - 2 VIEW COMPARISON:  04/29/2018 FINDINGS: Improved aeration in the left lower chest is suggestive for decreasing pleural fluid. Cardiac silhouette remains enlarged. Right pleural effusion may slightly enlarged. Again noted are patchy densities in the right mid and upper lung. Severe kyphotic deformity near the thoracolumbar spine with probable compression deformity. Difficult to evaluate for interval change in the spine. IMPRESSION: Slightly increased patchy densities in the right lung are worrisome for pneumonia. Slightly increased right pleural fluid. Improved aeration in the left lower chest may represent decreased left pleural effusion. Electronically Signed   By: Markus Daft M.D.   On: 05/01/2018 08:21   Ct Chest Wo Contrast  Result Date: 04/29/2018 CLINICAL DATA:  Bilateral pleural effusions on chest radiograph EXAM: CT CHEST WITHOUT CONTRAST TECHNIQUE: Multidetector  CT imaging of the chest was performed following the standard protocol without IV contrast. COMPARISON:  Chest radiograph dated 04/28/2018 FINDINGS: Cardiovascular: Mild cardiomegaly with left atrial enlargement. No evidence of thoracic aortic aneurysm. Atherosclerotic calcifications of the aortic arch. Three vessel coronary atherosclerosis. Mediastinum/Nodes: Mediastinal lymphadenopathy, including a dominant 2.5 cm short axis node in the low right paratracheal region (series 3/image 60) and 1.6 cm short axis subcarinal node (series 3/image 69). Visualized thyroid is notable for an 8 mm calcified right thyroid nodule. Lungs/Pleura: 13 x 14 mm central right upper lobe nodule (series 4/image 87), suspicious for  primary bronchogenic neoplasm. Additional linear/platelike opacities in the right upper lobe, nonspecific. Right lower lobe opacity favors compressive atelectasis (series 3/image 86). Additional atelectasis in the right middle lobe and left lower lobe. Moderate right and small left pleural effusions. Scattered interstitial opacities with interlobular septal thickening in the bilateral upper lobes, suggesting very mild edema. Mild to moderate centrilobular emphysematous changes, upper lobe predominant. No pneumothorax. Upper Abdomen: Visualized upper abdomen is notable for vascular calcifications. Musculoskeletal: Degenerative changes of the visualized thoracolumbar spine. IMPRESSION: 14 mm right upper lobe nodule, suspicious for primary bronchogenic neoplasm. Associated mediastinal lymphadenopathy, including a dominant 2.5 cm short axis node in the low right paratracheal region. Cardiomegaly with possible mild interstitial edema. Moderate right and small left pleural effusions. Aortic Atherosclerosis (ICD10-I70.0) and Emphysema (ICD10-J43.9). Electronically Signed   By: Julian Hy M.D.   On: 04/29/2018 11:09   US Thoracentesis Asp Pleural Space W/img Guide  Result Date: 04/30/2018 INDICATION: Patient  with history of acute on chronic diastolic HF, dyspnea, and right pleural effusion. Request made for diagnostic and therapeutic right thoracentesis. EXAM: ULTRASOUND GUIDED DIAGNOSTIC AND THERAPEUTIC RIGHT THORACENTESIS MEDICATIONS: 8 mL 1% lidocaine COMPLICATIONS: None immediate. PROCEDURE: An ultrasound guided thoracentesis was thoroughly discussed with the patient and questions answered. The benefits, risks, alternatives and complications were also discussed. The patient understands and wishes to proceed with the procedure. Written consent was obtained. Ultrasound was performed to localize and mark an adequate pocket of fluid in the right chest. The area was then prepped and draped in the normal sterile fashion. 1% Lidocaine was used for local anesthesia. Under ultrasound guidance a 6 Fr Safe-T-Centesis catheter was introduced. Thoracentesis was performed. The catheter was removed and a dressing applied. FINDINGS: A total of approximately 1.1 L of hazy amber fluid was removed. Samples were sent to the laboratory as requested by the clinical team. IMPRESSION: Successful ultrasound guided right thoracentesis yielding 1.1 L of pleural fluid. Read by: Earley Abide, PA-C Electronically Signed   By: Sandi Mariscal M.D.   On: 04/29/2018 16:31    Cardiac Studies   Echo 03/18/2018   1. Normal left ventricular size and systolic function.  2. Grade 2 diastolic dysfunction with elevated left atrial pressure.  3. Mildly dilated left atrial size.  4. Normal right atrial size.  5. Normal tricuspid valve.  6. Tricuspid regurgitation is mild.  7. The aortic valve tricuspid. There is mild thickening and mild calcification of the aortic valve.  8. The inferior vena cava was dilated in size with >50% respiratory variablity.  9. No atrial level shunt detected by color flow Doppler.  FINDINGS  Left Ventricle: No evidence of left ventricular regional wall motion abnormalities. The left ventricle has normal systolic  function of 50-09%. The cavity size is normal. There is normal left ventricular wall thickness. Echo evidence of pseudonormal  diastolic filling patterns. Elevated mean left atrial pressure. Right Ventricle: The right ventricle is normal in size. There is normal wall thickness. There is normal systolic function. Right ventricular systolic pressure is normal with an estimated pressure of 49.0 mmHg....  Patient Profile     85 y.o. female with chronic respiratory failure w hypoxia due to COPD admitted w relapse of acute diastolic CHF exacerbation in setting of recurrent atrial fibrillation. Additional issues include history of CVA and carotid disease s/p L CEA, HTN, HLP  Assessment & Plan    1. CHF:  Appears almost at euvolemia - target weight 99 lb. Start on oral diuretic. 2. AFib:  Persistent.  Rate controlled on current regimen. Cardioversion considered, but deferred due to missed anticoagulant doses. Will delay DCCV for 30 days, which will also allow better levels of amiodarone to accrue.     For questions or updates, please contact [place] Please consult [email or web address] for contact info under        Signed, Sanda Klein, MD  05/01/2018, 9:09 AM

## 2018-05-01 NOTE — Consult Note (Signed)
   [facility] CM Inpatient Consult   05/01/2018  VAL FARNAM 07-06-1917 [id number]   Patient screened for extreme high risk score and hospitalizations to check if potential [facility] Management services are needed . Per MD notes today:  patientis a 85 y.o.femalewith medical history significant ofuterine cancer; CVA; Paget's carcinoma of the nipple; hypothyroidism; HTN; HLD; and COPD presenting with SOB.She woke up needing to go to the restroom and she just could not catch her breath. She was really struggling to breathe. She was previously on an antibiotic for PNA and had an xray upon completion and was diagnosed with CHF. The facility was again concerned about PNA. They started antibiotics on Tuesday, doxycycline, without improvement. She did not feel well the last night or two with SOB and difficulty sleeping. +cough, nonproductive. +LE edema, changed from Lasix to [place] after having increased the dose a week or two ago. Weight had increased from 99 to 106. No fever.  Patient is from [facility] from the rehab side per inpatient transition of care social worker's notes. No current community care management needs noted while in skilled facility.  Please place a [facility] Care Management consult for changes to disposition/needs or for questions contact:    Natividad Brood, RN BSN [facility] Liaison  [phone] business mobile phone Toll free office [phone]

## 2018-05-01 NOTE — Care Management Important Message (Signed)
Important Message  Patient Details  Name: Katherine [name] MRN: [id number] Date of Birth: October 01, 1917   Medicare Important Message Given:  Yes    Barb Merino Lacharles Altschuler 05/01/2018, 4:41 PM

## 2018-05-01 NOTE — Progress Notes (Signed)
Physical Therapy Treatment Patient Details Name: Katherine [name] MRN: [id number] DOB: 24-Oct-1917 Today's Date: 05/01/2018    History of Present Illness Pt is a 85 y.o. female admitted from [facility] (where she has ben living x 1 month, typically lives in [place]) on 04/27/18 with SOB, cough and LE edema; worked up for worsening CHF. PMH includes CVA, HTN, COPD. Of note, recent admission for respiratory distress thought to be due to combination of CAP and CHF.    PT Comments    Pt admitted with above diagnosis. Pt currently with functional limitations due to balance and endurance deficits. Pt was able to ambulate with RW with min guard assist to min assist as pt needs cues for safety with RW and to stay close to RW.  Pt incr distance today. Desats at rest.  Needed 3LO2 to keep sats >90%.  Continues to need therapy.  Will follow acutely.   Pt will benefit from skilled PT to increase their independence and safety with mobility to allow discharge to the venue listed below.    SATURATION QUALIFICATIONS: (This note is used to comply with regulatory documentation for home oxygen)  Patient Saturations on Room Air at Rest = 85%  Patient Saturations on Room Air while Ambulating = NT as pt desat at rest on RA  Patient Saturations on 3 Liters of oxygen while Ambulating = 90%  Please briefly explain why patient needs home oxygen:Pt needed 3LO2 to keep sats >90% with activity.     Follow Up Recommendations  SNF(return to [facility])     Equipment Recommendations  None recommended by PT    Recommendations for Other Services       Precautions / Restrictions Precautions Precautions: Fall Precaution Comments: Wears 2L O2 baseline Restrictions Weight Bearing Restrictions: No    Mobility  Bed Mobility Overal bed mobility: Modified Independent             General bed mobility comments: HOB up, increased time  Transfers Overall transfer level: Needs assistance Equipment  used: Rolling walker (2 wheeled) Transfers: Sit to/from [facility] Sit to Stand: Min assist Stand pivot transfers: Min assist       General transfer comment: assist to rise and steady  Ambulation/Gait Ambulation/Gait assistance: Min guard;Min assist Gait Distance (Feet): 35 Feet Assistive device: Rolling walker (2 wheeled) Gait Pattern/deviations: Step-to pattern;Trunk flexed Gait velocity: Decreased Gait velocity interpretation: <1.31 ft/sec, indicative of household ambulator General Gait Details: min guard for balance with pt with kyphotic posture throughout. Pt got to 3N1 first and then ambulated with RW.    Stairs             Wheelchair Mobility    Modified Rankin (Stroke Patients Only)       Balance Overall balance assessment: Needs assistance   Sitting balance-Leahy Scale: Good     Standing balance support: Bilateral upper extremity supported;During functional activity Standing balance-Leahy Scale: Poor Standing balance comment: Reliant on UE support                            Cognition Arousal/Alertness: Awake/alert Behavior During Therapy: WFL for tasks assessed/performed Overall Cognitive Status: Within Functional Limits for tasks assessed                                        Exercises  General Comments        Pertinent Vitals/Pain Pain Assessment: No/denies pain    Home Living                      Prior Function            PT Goals (current goals can now be found in the care plan section) Acute Rehab PT Goals Patient Stated Goal: Return home breathing better Progress towards PT goals: Progressing toward goals    Frequency    Min 2X/week      PT Plan Current plan remains appropriate    Co-evaluation              AM-PAC PT "6 Clicks" Mobility   Outcome Measure  Help needed turning from your back to your side while in a flat bed without using bedrails?:  None Help needed moving from lying on your back to sitting on the side of a flat bed without using bedrails?: None Help needed moving to and from a bed to a chair (including a wheelchair)?: A Little Help needed standing up from a chair using your arms (e.g., wheelchair or bedside chair)?: A Little Help needed to walk in hospital room?: A Little Help needed climbing 3-5 steps with a railing? : A Lot 6 Click Score: 19    End of Session Equipment Utilized During Treatment: Oxygen;Gait belt Activity Tolerance: Patient tolerated treatment well;Patient limited by fatigue Patient left: in chair;with call bell/phone within reach;with chair alarm set;with nursing/sitter in room Nurse Communication: Mobility status PT Visit Diagnosis: Other abnormalities of gait and mobility (R26.89)     Time: 1040-1103 PT Time Calculation (min) (ACUTE ONLY): 23 min  Charges:  $Gait Training: 23-37 mins                     Shelby Anderle,PT Acute Rehabilitation Services Pager:  [phone]  Office:  [place] 05/01/2018, 1:27 PM

## 2018-05-02 ENCOUNTER — Inpatient Hospital Stay (HOSPITAL_COMMUNITY): Payer: Medicare Other

## 2018-05-02 DIAGNOSIS — K219 Gastro-esophageal reflux disease without esophagitis: Secondary | ICD-10-CM | POA: Diagnosis present

## 2018-05-02 DIAGNOSIS — Z90710 Acquired absence of both cervix and uterus: Secondary | ICD-10-CM | POA: Diagnosis not present

## 2018-05-02 DIAGNOSIS — J209 Acute bronchitis, unspecified: Secondary | ICD-10-CM | POA: Diagnosis not present

## 2018-05-02 DIAGNOSIS — Z9841 Cataract extraction status, right eye: Secondary | ICD-10-CM | POA: Diagnosis not present

## 2018-05-02 DIAGNOSIS — J449 Chronic obstructive pulmonary disease, unspecified: Secondary | ICD-10-CM | POA: Diagnosis not present

## 2018-05-02 DIAGNOSIS — R05 Cough: Secondary | ICD-10-CM | POA: Diagnosis not present

## 2018-05-02 DIAGNOSIS — I4891 Unspecified atrial fibrillation: Secondary | ICD-10-CM | POA: Diagnosis present

## 2018-05-02 DIAGNOSIS — I130 Hypertensive heart and chronic kidney disease with heart failure and stage 1 through stage 4 chronic kidney disease, or unspecified chronic kidney disease: Secondary | ICD-10-CM | POA: Diagnosis present

## 2018-05-02 DIAGNOSIS — I509 Heart failure, unspecified: Secondary | ICD-10-CM | POA: Diagnosis not present

## 2018-05-02 DIAGNOSIS — J302 Other seasonal allergic rhinitis: Secondary | ICD-10-CM | POA: Diagnosis present

## 2018-05-02 DIAGNOSIS — Z20828 Contact with and (suspected) exposure to other viral communicable diseases: Secondary | ICD-10-CM | POA: Diagnosis present

## 2018-05-02 DIAGNOSIS — R0902 Hypoxemia: Secondary | ICD-10-CM | POA: Diagnosis not present

## 2018-05-02 DIAGNOSIS — R0689 Other abnormalities of breathing: Secondary | ICD-10-CM | POA: Diagnosis not present

## 2018-05-02 DIAGNOSIS — J440 Chronic obstructive pulmonary disease with acute lower respiratory infection: Secondary | ICD-10-CM | POA: Diagnosis not present

## 2018-05-02 DIAGNOSIS — I4819 Other persistent atrial fibrillation: Secondary | ICD-10-CM | POA: Diagnosis not present

## 2018-05-02 DIAGNOSIS — Z7401 Bed confinement status: Secondary | ICD-10-CM | POA: Diagnosis not present

## 2018-05-02 DIAGNOSIS — J90 Pleural effusion, not elsewhere classified: Secondary | ICD-10-CM | POA: Diagnosis not present

## 2018-05-02 DIAGNOSIS — I5032 Chronic diastolic (congestive) heart failure: Secondary | ICD-10-CM | POA: Diagnosis not present

## 2018-05-02 DIAGNOSIS — R0603 Acute respiratory distress: Secondary | ICD-10-CM | POA: Diagnosis not present

## 2018-05-02 DIAGNOSIS — I959 Hypotension, unspecified: Secondary | ICD-10-CM | POA: Diagnosis not present

## 2018-05-02 DIAGNOSIS — R0602 Shortness of breath: Secondary | ICD-10-CM | POA: Diagnosis present

## 2018-05-02 DIAGNOSIS — J9622 Acute and chronic respiratory failure with hypercapnia: Secondary | ICD-10-CM | POA: Diagnosis present

## 2018-05-02 DIAGNOSIS — J441 Chronic obstructive pulmonary disease with (acute) exacerbation: Secondary | ICD-10-CM | POA: Diagnosis not present

## 2018-05-02 DIAGNOSIS — J9621 Acute and chronic respiratory failure with hypoxia: Secondary | ICD-10-CM | POA: Diagnosis not present

## 2018-05-02 DIAGNOSIS — M2550 Pain in unspecified joint: Secondary | ICD-10-CM | POA: Diagnosis not present

## 2018-05-02 DIAGNOSIS — M810 Age-related osteoporosis without current pathological fracture: Secondary | ICD-10-CM | POA: Diagnosis present

## 2018-05-02 DIAGNOSIS — N183 Chronic kidney disease, stage 3 (moderate): Secondary | ICD-10-CM | POA: Diagnosis present

## 2018-05-02 DIAGNOSIS — Z87440 Personal history of urinary (tract) infections: Secondary | ICD-10-CM | POA: Diagnosis not present

## 2018-05-02 DIAGNOSIS — D649 Anemia, unspecified: Secondary | ICD-10-CM | POA: Diagnosis not present

## 2018-05-02 DIAGNOSIS — Z8701 Personal history of pneumonia (recurrent): Secondary | ICD-10-CM | POA: Diagnosis not present

## 2018-05-02 DIAGNOSIS — Z9842 Cataract extraction status, left eye: Secondary | ICD-10-CM | POA: Diagnosis not present

## 2018-05-02 DIAGNOSIS — I10 Essential (primary) hypertension: Secondary | ICD-10-CM | POA: Diagnosis not present

## 2018-05-02 DIAGNOSIS — I6529 Occlusion and stenosis of unspecified carotid artery: Secondary | ICD-10-CM | POA: Diagnosis not present

## 2018-05-02 DIAGNOSIS — I5033 Acute on chronic diastolic (congestive) heart failure: Secondary | ICD-10-CM | POA: Diagnosis present

## 2018-05-02 DIAGNOSIS — R6889 Other general symptoms and signs: Secondary | ICD-10-CM | POA: Diagnosis not present

## 2018-05-02 DIAGNOSIS — R000 Tachycardia, unspecified: Secondary | ICD-10-CM | POA: Diagnosis not present

## 2018-05-02 DIAGNOSIS — Z853 Personal history of malignant neoplasm of breast: Secondary | ICD-10-CM | POA: Diagnosis not present

## 2018-05-02 DIAGNOSIS — I493 Ventricular premature depolarization: Secondary | ICD-10-CM | POA: Diagnosis present

## 2018-05-02 DIAGNOSIS — D631 Anemia in chronic kidney disease: Secondary | ICD-10-CM | POA: Diagnosis present

## 2018-05-02 DIAGNOSIS — M1990 Unspecified osteoarthritis, unspecified site: Secondary | ICD-10-CM | POA: Diagnosis present

## 2018-05-02 DIAGNOSIS — E039 Hypothyroidism, unspecified: Secondary | ICD-10-CM | POA: Diagnosis present

## 2018-05-02 DIAGNOSIS — Z66 Do not resuscitate: Secondary | ICD-10-CM | POA: Diagnosis present

## 2018-05-02 DIAGNOSIS — J189 Pneumonia, unspecified organism: Secondary | ICD-10-CM | POA: Diagnosis not present

## 2018-05-02 DIAGNOSIS — E785 Hyperlipidemia, unspecified: Secondary | ICD-10-CM | POA: Diagnosis present

## 2018-05-02 DIAGNOSIS — E032 Hypothyroidism due to medicaments and other exogenous substances: Secondary | ICD-10-CM | POA: Diagnosis not present

## 2018-05-02 DIAGNOSIS — J9612 Chronic respiratory failure with hypercapnia: Secondary | ICD-10-CM | POA: Diagnosis not present

## 2018-05-02 DIAGNOSIS — R2681 Unsteadiness on feet: Secondary | ICD-10-CM | POA: Diagnosis not present

## 2018-05-02 DIAGNOSIS — I480 Paroxysmal atrial fibrillation: Secondary | ICD-10-CM | POA: Diagnosis not present

## 2018-05-02 DIAGNOSIS — R5381 Other malaise: Secondary | ICD-10-CM | POA: Diagnosis not present

## 2018-05-02 DIAGNOSIS — Z8619 Personal history of other infectious and parasitic diseases: Secondary | ICD-10-CM | POA: Diagnosis not present

## 2018-05-02 DIAGNOSIS — J9611 Chronic respiratory failure with hypoxia: Secondary | ICD-10-CM | POA: Diagnosis not present

## 2018-05-02 DIAGNOSIS — J948 Other specified pleural conditions: Secondary | ICD-10-CM | POA: Diagnosis not present

## 2018-05-02 DIAGNOSIS — M6281 Muscle weakness (generalized): Secondary | ICD-10-CM | POA: Diagnosis not present

## 2018-05-02 DIAGNOSIS — D72829 Elevated white blood cell count, unspecified: Secondary | ICD-10-CM | POA: Diagnosis not present

## 2018-05-02 LAB — CBC
HCT: 30.2 % — ABNORMAL LOW (ref 36.0–46.0)
Hemoglobin: 9.9 g/dL — ABNORMAL LOW (ref 12.0–15.0)
MCH: 30.7 pg (ref 26.0–34.0)
MCHC: 32.8 g/dL (ref 30.0–36.0)
MCV: 93.8 fL (ref 80.0–100.0)
Platelets: 348 10*3/uL (ref 150–400)
RBC: 3.22 MIL/uL — ABNORMAL LOW (ref 3.87–5.11)
RDW: 14.2 % (ref 11.5–15.5)
WBC: 9.2 10*3/uL (ref 4.0–10.5)
nRBC: 0 % (ref 0.0–0.2)

## 2018-05-02 LAB — CULTURE, BLOOD (ROUTINE X 2)
Culture: NO GROWTH
Culture: NO GROWTH
Special Requests: ADEQUATE

## 2018-05-02 LAB — COMPREHENSIVE METABOLIC PANEL
ALT: 24 U/L (ref 0–44)
AST: 23 U/L (ref 15–41)
Albumin: 2.9 g/dL — ABNORMAL LOW (ref 3.5–5.0)
Alkaline Phosphatase: 56 U/L (ref 38–126)
Anion gap: 10 (ref 5–15)
BUN: 43 mg/dL — ABNORMAL HIGH (ref 8–23)
CO2: 27 mmol/L (ref 22–32)
CREATININE: 1.5 mg/dL — AB (ref 0.44–1.00)
Calcium: 8.7 mg/dL — ABNORMAL LOW (ref 8.9–10.3)
Chloride: 96 mmol/L — ABNORMAL LOW (ref 98–111)
GFR calc Af Amer: 33 mL/min — ABNORMAL LOW (ref >60)
GFR calc non Af Amer: 29 mL/min — ABNORMAL LOW (ref >60)
Glucose, Bld: 131 mg/dL — ABNORMAL HIGH (ref 70–99)
Potassium: 4.4 mmol/L (ref 3.5–5.1)
Sodium: 133 mmol/L — ABNORMAL LOW (ref 135–145)
Total Bilirubin: 0.6 mg/dL (ref 0.3–1.2)
Total Protein: 5.7 g/dL — ABNORMAL LOW (ref 6.5–8.1)

## 2018-05-02 LAB — T3, FREE: T3 FREE: 1 pg/mL — AB (ref 2.0–4.4)

## 2018-05-02 LAB — MAGNESIUM: Magnesium: 1.9 mg/dL (ref 1.7–2.4)

## 2018-05-02 MED ORDER — LEVOTHYROXINE SODIUM 100 MCG PO TABS: 100.0000 ug | ORAL_TABLET | Freq: Every day | ORAL | 0 refills | Status: DC

## 2018-05-02 MED ORDER — AMIODARONE HCL 200 MG PO TABS: 400.0000 mg | ORAL_TABLET | Freq: Every day | ORAL | Status: DC

## 2018-05-02 MED ORDER — PREDNISONE 20 MG PO TABS: ORAL_TABLET | ORAL | 0 refills | Status: AC

## 2018-05-02 MED ORDER — FUROSEMIDE 40 MG PO TABS: 40.0000 mg | ORAL_TABLET | Freq: Every day | ORAL | 0 refills | Status: DC

## 2018-05-02 MED ORDER — CEFDINIR 300 MG PO CAPS: 300.0000 mg | ORAL_CAPSULE | Freq: Every day | ORAL | 0 refills | Status: AC

## 2018-05-02 MED ORDER — AMIODARONE HCL 400 MG PO TABS: 400.0000 mg | ORAL_TABLET | Freq: Every day | ORAL | 0 refills | Status: DC

## 2018-05-02 NOTE — Discharge Summary (Addendum)
**Note De-Identified vi Obfusction** Physicin Dischrge Summry  OVELLA MANYGOATS FBP:[id number] DOB: [birth year] DOA: 04/27/2018  PCP: Virgie Dd, MD  Admit dte: 04/27/2018 Dischrge dte: 05/02/2018  Time spent: 40 minutes  Recommendtions for Outptient Follow-up:  1. Follow up outptient CBC/CMP (follow renl Function by 3/20 to ensure lsix dosing does not need to be djusted) 2. Follow repet chest x ry in 1 week to follow pleurl effusions.  3. CT scn from 3/14 with 14 mm right upper lobe nodule concerning for primry bronchogenic neoplsm.  Cytology from effusion on 3/14 without mlignnt cells.  Discussed possible referrl to pulm to consider biopsy, pt focused on qulity of life t this point nd ws not interested in referrl t this time.  Continue to discuss gols of cre with PCP nd pllitive cre outptient. 4. Recommend pllitive cre to follow s n outptient  5. Follow up with crdiology in 3-4 weeks for crdioversion.   6. Continue miodrone 400 mg dily until April 14th, then decrese to 200 mg dily  7. Follow pleurl fluid cultures  8. Follow repet thyroid function tests with djusted synthroid 9. Pt now on 3 L chroniclly   Dischrge Dignoses:  Principl Problem:   Acute on chronic distolic (congestive) hert filure (HCC) Active Problems:   COPD (chronic obstructive pulmonry disese) (HCC)   Hypothyroidism   Hypertension   Atril fibrilltion (HCC)   CKD (chronic kidney disese) stge 3, GFR 30-59 ml/min (HCC)   Pressure injury of skin   Dischrge Condition: stble  Diet recommendtion: hert helthy  Filed Weights   04/30/18 0540 05/01/18 0520 05/02/18 0500  Weight: 46.1 kg 46 kg 46.2 kg    History of present illness:  Tni Virgo Smithis  85 y.o.femlewith medicl history significnt ofuterine cncer; CVA; Pget's crcinom of the nipple; hypothyroidism; HTN; HLD; nd COPD presenting with SOB.She woke up needing to go to the restroom nd she just could not ctch her  breth. She ws relly struggling to brethe. She ws previously on n ntibiotic for PNA nd hd n xry upon completion nd ws dignosed with CHF. The fcility ws gin concerned bout PNA. They strted ntibiotics on Tuesdy, doxycycline, without improvement. She did not feel well the lst night or two with SOB nd difficulty sleeping. +cough, nonproductive. +LE edem, chnged from Lsix to To[place]mide fter hving incresed the dose  week or two go. Weight hd incresed from 99 to 106. No fever.  She ws hospitlized 1/31-2/4 for CAP; cute on chronic distolic CHF; nd new onset PAF. She ws strted on Eliquis, treted with Azithromycin nd Vntin, dischrged on 24/7 O2, nd diuresed with Lsix.  ED Course:Likely new/worsening CHF. Echo in [place] with distolic CHF. Hs hd ntibiotics, yesterdy with new fib, stopped Lsix nd strted Toresemide. In mid-80s nd worse. Sent her in nd on BIPAP which hs now been wened. Resonble fmily, DNR nd no intubtion.  She ws dmitted for  hert filure excerbtion.  She improved fter bipp nd diuresis.  She ws lso treted for possible COPD excerbtion nd pneumoni.  Crdiology ws consulted nd ws considering crdioversion in setting of her tril fibrilltion, but she missed eliquis doses in setting of need for thorcentesis.  She hd  CT scn with 14 mm right upper lobe nodule concerning for mlignncy.  She hd thorcentesis with negtive cytology, trnsudtive.  She'd improved t the time of dischrge.  Pln for follow up outptient with PCP nd pllitive cre outptient.   See below for dditionl detils  Hospitl Course:  ##Acute **Note De-Identified vi Obfusction** on Chronic hypoxic respirtory filure - Tody she tells me she's on 2 L chroniclly -> dischrged from this hospitliztion on 3 L  - Suspect this is multifctoril in setting of R>L effusions, HF, COPD excerbtions below - Repet cxr 3/17 with ptchy density in RUL known to  represent scrring with ssocited suspicious 1.4 cm nodule by recent chest CT s well s stble bilterl pleurl effusions likely with ssocited bsilr telectsis.  "infection in the lung bses is possible".  Pt currently febrile with norml WBC count.  - wen s tolerted - Pulmonry toilet   ##Acute on chronic congestive hert filure - Norml EF 03/2018 with grde 2 distolic dysfunction - Trnsition to PO lsix tody - CT chest with evidence of mild interstitil edem nd CXR similrly with edem - I/O, dily weights  ##COPD excerbtion  Possible Pneumoni -Dischrged on cefdinir for 7 dy course -Steroid tper -Continue nebs t home -Ptient sttes hs remote history of smoking  ##Bilterl pleurl effusions  Right Upper Lobe Nodule concerning for Primry bronchogenic neoplsm  Concern for Pneumoni: - negtive by lights - cytology without mlignnt cells - CXR 3/17 with stble bilterl effusions - follow cultures (cid fst smer negtive, cid fst cx pending, fungl cx pending, body fluid culture - NGTD x3) - on bx s noted bove, she's febrile, norml WBC count - discussed likely mlignncy bsed on imging, but negtive cytology.  Discussed possibility of pulm referrl to consider bx.  Pt nd nephew note qulity of life most importnt for her.  Not interested in this referrl t this point in time.  Doesn't think she would wnt chemo or rdition.  Recommend continue to discuss with pllitive cre nd PCP.  ##Debility -Involved physicl therpy  ##Proxysml tril fibrilltion -Continue with the Crdizem - Apprecite crds recs - d/c spirin, continue eliquis  - pln for DCCV t 30 dys  - miodrone hs been strted (continue 400 mg dily until April 14th, then decrese to 200 mg dily) - Consider crdioversion prior to d/c  ##Hypothyroidism -Continue the Synthroid, TSH elevted. - will increse dose to 100 mcg nd will need f/u in 4-6 weeks  ##  Hypontremi: improved with diuresis  # AKI: fter diuresis, 1.5 tody.  Follow in  few dys to ensure not continuing to rise.  Lsix dose my need to be djusted.   Procedures: none  Consulttions:  crdiology  Dischrge Exm: Vitls:   05/02/18 0807 05/02/18 0848  BP:  115/64  Pulse:  76  Resp:    Temp:    SpO2: 94% 95%   Feeling better. [facility] Discussed with nephrew, niece.  Generl: No cute distress. Fril. Crdiovsculr: irregulrly irregulr  Lungs: scttered crckles, decresed breth sounds t bses Abdomen: Soft, nontender, nondistended  Neurologicl: Alert nd oriented 3. Moves ll extremities 4. Crnil nerves II through XII grossly intct. Skin: Wrm nd dry. No rshes or lesions. Extremities: No clubbing or cynosis. No edem.  Psychitric: Mood nd ffect re norml. Insight nd judgment re pproprite.  Dischrge Instructions   Dischrge Instructions    Cll MD for:  difficulty brething, hedche or visul disturbnces   Complete by:  As directed    Cll MD for:  extreme ftigue   Complete by:  As directed    Cll MD for:  hives   Complete by:  As directed    Cll MD for:  persistnt dizziness or light-hededness   Complete by:  As directed    Cll **Note De-Identified vi Obfusction** MD for:  persistnt nuse nd vomiting   Complete by:  As directed    Cll MD for:  redness, tenderness, or signs of infection (pin, swelling, redness, odor or green/yellow dischrge round incision site)   Complete by:  As directed    Cll MD for:  severe uncontrolled pin   Complete by:  As directed    Cll MD for:  temperture >100.4   Complete by:  As directed    Diet - low sodium hert helthy   Complete by:  As directed    Diet - low sodium hert helthy   Complete by:  As directed    Dischrge instructions   Complete by:  As directed    You were seen for  hert filure excerbtion.  You improved with lsix.  Your home dose of lsix ws incresed to 40 mg  dily.  Plese follow up repet kidney function in  few dys to ensure this dose of lsix is ok to continue.  Check your weight dily.  If your weight increses to greter thn 103 lbs, plese cll the crdiology office for instructions.  For your tril fibrilltion, we've strted you on miodrone.  Continue this s prescribed by crdiology (400 mg until April 14th, then decrese the dose to 200 mg dily).  Plese follow up with crdiology s n outptient to consider crdioversion.  You were lso treted for  COPD excerbtion nd possible pneumoni.  You'll be dischrged on  steroid tper.    Your CT scn showed findings concerning for cncer.  We discussed possibly following up with pulmonology, but bsed on our discussion, we'll pln on focusing on qulity of life with pllitive cre consult nd not pursuing  biopsy t this point.  Plese continue to discuss gols of cre with pllitive cre nd your primry cre provider.  You'll need  repet chest x ry in 1 week for your pleurl effusion.  If this is incresed gin, you my need  repet thorcentesis.    Return for new, recurrent, or worsening symptoms.  Plese sk your PCP to request records from this hospitliztion so they know wht ws done nd wht the next steps will be.   Increse ctivity slowly   Complete by:  As directed    Increse ctivity slowly   Complete by:  As directed      Allergies s of 05/02/2018      Rections   Sulf Antibiotics Shortness Of Breth, Plpittions   Sulfmethoxzole Shortness Of Breth, Plpittions   Amoxicillin Rsh   Penicillin G Rsh   Did it involve swelling of the fce/tongue/throt, SOB, or low BP? Yes Did it involve sudden or severe rsh/hives, skin peeling, or ny rection on the inside of your mouth or nose? Unk Did you need to seek medicl ttention t  hospitl or doctor's office? Unk When did it lst hppen? "it ws  long time go" If ll bove nswers re "NO", my  proceed with cephlosporin use.   Penicillins Rsh   Did it involve swelling of the fce/tongue/throt, SOB, or low BP? Yes Did it involve sudden or severe rsh/hives, skin peeling, or ny rection on the inside of your mouth or nose? Unk Did you need to seek medicl ttention t  hospitl or doctor's office? Unk When did it lst hppen? "it ws  long time go" If ll bove nswers re "NO", my proceed with cephlosporin use.   Tpe Other (See Comments)   SKIN IS VERY FRAGILE- PLEASE  USE AN ALTERNATIVE TO TAPE, AS THE SKIN TEARS EASILY!!      Medication List    STOP taking these medications   torsemide 20 MG tablet Commonly known as:  DEMADEX     TAKE these medications   amiodarone 400 MG tablet Commonly known as:  PACERONE Take 1 tablet (400 mg total) by mouth daily for 27 days. (decrease to 200 mg daily on April 14th) Start taking on:  May 03, 2018   apixaban 2.5 MG Tabs tablet Commonly known as:  ELIQUIS Take 1 tablet (2.5 mg total) by mouth 2 (two) times daily.   cefdinir 300 MG capsule Commonly known as:  OMNICEF Take 1 capsule (300 mg total) by mouth daily for 4 days.   Systane ICaps AREDS2 Caps Take 1 capsule by mouth daily.   CENTRUM SILVER PO Take 1 tablet by mouth daily.   diltiazem 120 MG 24 hr capsule Commonly known as:  CARDIZEM CD Take 1 capsule (120 mg total) by mouth daily.   fluticasone 50 MCG/ACT nasal spray Commonly known as:  FLONASE Place 2 sprays into the nose daily.   Fluticasone-Salmeterol 250-50 MCG/DOSE Aepb Commonly known as:  ADVAIR Inhale 1 puff into the lungs every 12 (twelve) hours.   furosemide 40 MG tablet Commonly known as:  LASIX Take 1 tablet (40 mg total) by mouth daily for 30 days. Start taking on:  May 03, 2018 What changed:    medication strength  how much to take   ipratropium-albuterol 0.5-2.5 (3) MG/3ML Soln Commonly known as:  DUONEB Take 3 mLs by nebulization every 6 (six) hours as needed  (WHEEZING). What changed:  Another medication with the same name was removed. Continue taking this medication, and follow the directions you see here.   levothyroxine 100 MCG tablet Commonly known as:  SYNTHROID, LEVOTHROID Take 1 tablet (100 mcg total) by mouth daily at 6 (six) AM for 30 days. Start taking on:  May 03, 2018 What changed:    medication strength  how much to take  when to take this   metoprolol tartrate 25 MG tablet Commonly known as:  LOPRESSOR Take 12.5 mg by mouth 2 (two) times daily.   OXYGEN Inhale 2 L into the lungs continuous.   pantoprazole 40 MG tablet Commonly known as:  PROTONIX Take 40 mg by mouth daily.   predniSONE 20 MG tablet Commonly known as:  DELTASONE Take 2 tablets (40 mg total) by mouth daily with breakfast for 1 day, THEN 1.5 tablets (30 mg total) daily with breakfast for 2 days, THEN 1 tablet (20 mg total) daily with breakfast for 2 days, THEN 0.5 tablets (10 mg total) daily with breakfast for 2 days. Start taking on:  May 03, 2018   saccharomyces boulardii 250 MG capsule Commonly known as:  FLORASTOR Take 250 mg by mouth 2 (two) times daily.   tiotropium 18 MCG inhalation capsule Commonly known as:  SPIRIVA Place 18 mcg into inhaler and inhale daily.   zinc oxide 20 % ointment Apply 1 application topically as needed for irritation. Apply to buttocks after every incontinent episode and as needed for redness      Allergies  Allergen Reactions  . Sulfa Antibiotics Shortness Of Breath and Palpitations  . Sulfamethoxazole Shortness Of Breath and Palpitations  . Amoxicillin Rash  . Penicillin G Rash    Did it involve swelling of the face/tongue/throat, SOB, or low BP? Yes Did it involve sudden or severe rash/hives, skin peeling, or any reaction **Note De-Identified vi Obfusction** on the inside of your mouth or nose? Unk Did you need to seek medicl ttention t  hospitl or doctor's office? Unk When did it lst hppen? "it ws  long time go" If ll bove  nswers re "NO", my proceed with cephlosporin use.   Mrlnd Kitchen Penicillins Rsh    Did it involve swelling of the fce/tongue/throt, SOB, or low BP? Yes Did it involve sudden or severe rsh/hives, skin peeling, or ny rection on the inside of your mouth or nose? Unk Did you need to seek medicl ttention t  hospitl or doctor's office? Unk When did it lst hppen? "it ws  long time go" If ll bove nswers re "NO", my proceed with cephlosporin use.   . Tpe Other (See Comments)    SKIN IS VERY FRAGILE- PLEASE USE AN ALTERNATIVE TO TAPE, AS THE SKIN TEARS EASILY!!   Follow-up Informtion    Virgie Dd, MD. Go on 05/17/2018.   Specilty:  Internl Medicine Why:  @2 :00pm Contct informtion: [place] [id number] [phone]        Almyr Deforest, PA Follow up.   Specilties:  Crdiology, Rdiology Why:  Crdiology hospitl follow up on 06/02/18 t 10:30. Plese rrive 15 minutes erly for check in.  Contct informtion: [address] [place] [address] [phone]            The results of significnt dignostics from this hospitliztion (including imging, microbiology, ncillry nd lbortory) re listed below for reference.    Significnt Dignostic Studies: Dg Chest 1 View  Result Dte: 04/29/2018 CLINICAL DATA:  Post right-sided thorcentesis. EXAM: CHEST  1 VIEW COMPARISON:  04/28/2018; chest CT-04/29/2018 FINDINGS: Grossly unchnged enlrged crdic silhouette nd medistinl contours with therosclerotic plque within thorcic ort. There is persistent rightwrd devition the trchel ir, the level of the ortic rch. There is persistent thickening the right prtrchel stripe presumbly to prominent vsculture. Intervl reduction in persistent smll right-sided pleurl effusion post thorcentesis. No pneumothorx. Improved ertion the right lung bse with persistent right bsilr opcities. Unchnged smll to moderte  size left-sided effusion nd ssocited left bsilr heterogeneous/consolidtive opcities. Pulmonry vsculture remins indistinct with cephliztion of flow. Moderte scoliotic curvture of the thorcolumbr spine, incompletely evluted. No definite cute osseous bnormlities. IMPRESSION: 1. Intervl reduction in persistent smll right-sided effusion post thorcentesis. No pneumothorx. 2. Improved ertion the right lung bse with persistent right bsilr opcities, likely telectsis. 3. Similr findings of pulmonry edem with smll to moderte size left-sided effusion ssocited left bsilr opcities, likely telectsis. Electroniclly Signed   By: Sndi Mriscl M.D.   On: 04/29/2018 16:27   Dg Chest 2 View  Result Dte: 05/02/2018 CLINICAL DATA:  Follow-up recent pneumoni. EXAM: CHEST - 2 VIEW COMPARISON:  05/01/2018 nd 03/17/2018 s well s recent chest CT 04/29/2018 FINDINGS: Lungs re dequtely inflted demonstrte stble ptchy density over the right upper lobe known to represent re of scrring with ssocited suspicious 1.4 cm nodule by recent CT. Stble smll to moderte right pleurl effusion nd stble smll left pleurl effusion. Likely ssocited bibsilr telectsis. Infection in the lung bses is possible. Crdiomedistinl silhouette nd reminder of the exm is unchnged. IMPRESSION: Stble ptchy density in the right upper lobe known to represent scrring with ssocited suspicious 1.4 cm nodule by recent chest CT. Stble bilterl pleurl effusions likely with ssocited bsilr telectsis. Infection in the lung bses is possible. Electroniclly Signed   By: Mrin Olp M.D.   On: 05/02/2018 09:07   Dg **Note De-Identified vi Obfusction** Chest 2 View  Result Dte: 05/01/2018 CLINICAL DATA:  Hypoxi. EXAM: CHEST - 2 VIEW COMPARISON:  04/29/2018 FINDINGS: Improved ertion in the left lower chest is suggestive for decresing pleurl fluid. Crdic silhouette remins enlrged. Right pleurl effusion my slightly  enlrged. Agin noted re ptchy densities in the right mid nd upper lung. Severe kyphotic deformity ner the thorcolumbr spine with probble compression deformity. Difficult to evlute for intervl chnge in the spine. IMPRESSION: Slightly incresed ptchy densities in the right lung re worrisome for pneumoni. Slightly incresed right pleurl fluid. Improved ertion in the left lower chest my represent decresed left pleurl effusion. Electroniclly Signed   By: Mrkus Dft M.D.   On: 05/01/2018 08:21   Ct Chest Wo Contrst  Result Dte: 04/29/2018 CLINICAL DATA:  Bilterl pleurl effusions on chest rdiogrph EXAM: CT CHEST WITHOUT CONTRAST TECHNIQUE: Multidetector CT imging of the chest ws performed following the stndrd protocol without IV contrst. COMPARISON:  Chest rdiogrph dted 04/28/2018 FINDINGS: Crdiovsculr: Mild crdiomegly with left tril enlrgement. No evidence of thorcic ortic neurysm. Atherosclerotic clcifictions of the ortic rch. Three vessel coronry therosclerosis. Medistinum/Nodes: Medistinl lymphdenopthy, including  dominnt 2.5 cm short xis node in the low right prtrchel region (series 3/imge 60) nd 1.6 cm short xis subcrinl node (series 3/imge 69). Visulized thyroid is notble for n 8 mm clcified right thyroid nodule. Lungs/Pleur: 13 x 14 mm centrl right upper lobe nodule (series 4/imge 87), suspicious for primry bronchogenic neoplsm. Additionl liner/pltelike opcities in the right upper lobe, nonspecific. Right lower lobe opcity fvors compressive telectsis (series 3/imge 86). Additionl telectsis in the right middle lobe nd left lower lobe. Moderte right nd smll left pleurl effusions. Scttered interstitil opcities with interlobulr septl thickening in the bilterl upper lobes, suggesting very mild edem. Mild to moderte centrilobulr emphysemtous chnges, upper lobe predominnt. No pneumothorx. Upper Abdomen:  Visulized upper bdomen is notble for vsculr clcifictions. Musculoskeletl: Degenertive chnges of the visulized thorcolumbr spine. IMPRESSION: 14 mm right upper lobe nodule, suspicious for primry bronchogenic neoplsm. Associted medistinl lymphdenopthy, including  dominnt 2.5 cm short xis node in the low right prtrchel region. Crdiomegly with possible mild interstitil edem. Moderte right nd smll left pleurl effusions. Aortic Atherosclerosis (ICD10-I70.0) nd Emphysem (ICD10-J43.9). Electroniclly Signed   By: Julin Hy M.D.   On: 04/29/2018 11:09   Dg Chest Port 1 View  Result Dte: 04/28/2018 CLINICAL DATA:  Shortness of breth. EXAM: PORTABLE CHEST 1 VIEW COMPARISON:  04/27/2018 nd prior exms FINDINGS: Crdiomegly, pulmonry vsculr congestion, interstitil pulmonry edem, bilterl pleurl effusions, RIGHT greter thn LEFT, nd bilterl LOWER lung telectsis gin noted. No pneumothorx. No significnt chnge from the prior study. IMPRESSION: Unchnged ppernce of the chest with pulmonry edem, bilterl pleurl effusions, RIGHT greter thn LEFT, nd bibsilr telectsis. Electroniclly Signed   By: Mrgrette Cnd M.D.   On: 04/28/2018 09:54   Dg Chest Portble 1 View  Result Dte: 04/27/2018 CLINICAL DATA:  Shortness of breth. EXAM: PORTABLE CHEST 1 VIEW COMPARISON:  Rdiogrph Jnury 31, 2020. FINDINGS: Stble crdiomegly. Atherosclerosis of thorcic ort is noted. No pneumothorx is noted. Intervl development of mild to moderte bilterl pleurl effusions re noted. Bilterl interstitil densities re noted which my represent edem. Bony thorx is unremrkble. IMPRESSION: Stble crdiomegly with probble bilterl pulmonry edem nd intervl development of mild to moderte bilterl pleurl effusions. Electroniclly Signed   By: Mrijo Conception, M.D.   On: 04/27/2018 13:35   [place] Thorcentesis Asp Pleurl Spce W/img Guide  Result Dte:  04/30/2018 **Note De-Identified vi Obfusction** INDICTION: Ptient with history of cute on chronic distolic HF, dyspne, nd right pleurl effusion. Request mde for dignostic nd therpeutic right thorcentesis. EXM: ULTRSOUND GUIDED DIGNOSTIC ND THERPEUTIC RIGHT THORCENTESIS MEDICTIONS: 8 mL 1% lidocine COMPLICTIONS: None immedite. PROCEDURE: n ultrsound guided thorcentesis ws thoroughly discussed with the ptient nd questions nswered. The benefits, risks, lterntives nd complictions were lso discussed. The ptient understnds nd wishes to proceed with the procedure. Written consent ws obtined. Ultrsound ws performed to loclize nd mrk n dequte pocket of fluid in the right chest. The re ws then prepped nd drped in the norml sterile fshion. 1% Lidocine ws used for locl nesthesi. Under ultrsound guidnce  6 Fr Sfe-T-Centesis ctheter ws introduced. Thorcentesis ws performed. The ctheter ws removed nd  dressing pplied. FINDINGS:  totl of pproximtely 1.1 L of hzy mber fluid ws removed. Smples were sent to the lbortory s requested by the clinicl tem. IMPRESSION: Successful ultrsound guided right thorcentesis yielding 1.1 L of pleurl fluid. Red by: Erley bide, P-C Electroniclly Signed   By: Sndi Mriscl M.D.   On: 04/29/2018 16:31    Microbiology: Recent Results (from the pst 240 hour(s))  Blood culture (routine x 2)     Sttus: None   Collection Time: 04/27/18 11:46 M  Result Vlue Ref Rnge Sttus   Specimen Description BLOOD LEFT NTECUBITL  Finl   Specil Requests   Finl    BOTTLES DRWN EROBIC ND NEROBIC Blood Culture dequte volume   Culture   Finl    NO GROWTH 5 DYS Performed t [facility], [address] [place], [place] [address]    Report Sttus 05/02/2018 FINL  Finl  Blood culture (routine x 2)     Sttus: None   Collection Time: 04/27/18 11:55 M  Result Vlue Ref Rnge Sttus   Specimen Description BLOOD LEFT FORERM  Finl    Specil Requests   Finl    BOTTLES DRWN EROBIC ND NEROBIC Blood Culture results my not be optiml due to n indequte volume of blood received in culture bottles   Culture   Finl    NO GROWTH 5 DYS Performed t [facility], [place] [address] [place], [place] [address]    Report Sttus 05/02/2018 FINL  Finl  MRS PCR Screening     Sttus: None   Collection Time: 04/27/18  3:03 PM  Result Vlue Ref Rnge Sttus   MRS by PCR NEGTIVE NEGTIVE Finl    Comment:        The GeneXpert MRS ssy (FD pproved for NSL specimens only), is one component of  comprehensive MRS coloniztion surveillnce progrm. It is not intended to dignose MRS infection nor to guide or monitor tretment for MRS infections. Performed t [facility], [place] [address] [place], [place] [address]   cid Fst Smer (FB)     Sttus: None   Collection Time: 04/29/18  4:15 PM  Result Vlue Ref Rnge Sttus   FB Specimen Processing Concentrtion  Finl   cid Fst Smer Negtive  Finl    Comment: (NOTE) Performed t: [facility] [place], [place] [id number] Rush Frmer MD TI:[id number]    Source (FB) PLEURL  Finl    Comment: RIGHT Performed t [facility], [place] [address] [place], [place] [address]   Culture, body fluid-bottle     Sttus: None (Preliminry result)   Collection Time: 04/29/18  4:15 PM  Result Vlue Ref Rnge Sttus  Specimen Description PLEURAL RIGHT  Final   Special Requests NONE  Final   Culture   Final    NO GROWTH 3 DAYS Performed at [facility] [address], [place], [place] [address]    Report Status PENDING  Incomplete  Gram stain     Status: None   Collection Time: 04/29/18  4:15 PM  Result Value Ref Range Status   Specimen Description PLEURAL RIGHT  Final   Special Requests NONE  Final   Gram Stain   Final    FEW WBC PRESENT, PREDOMINANTLY MONONUCLEAR NO ORGANISMS SEEN Performed at [facility], [address] [place], [place] [address]    Report Status 04/30/2018 FINAL  Final     Labs: Basic Metabolic Panel: Recent Labs  Lab 04/28/18 0427 04/29/18 0914 04/30/18 1333 05/01/18 0545 05/02/18 0402  NA 135 132* 129* 134* 133*  K 3.6 3.8 4.0 3.9 4.4  CL 96* 96* 96* 95* 96*  CO2 29 28 29 29 27   GLUCOSE 103* 134* 156* 140* 131*  BUN 18 21 27* 34* 43*  CREATININE 1.33* 1.20* 1.21* 1.39* 1.50*  CALCIUM 9.2 8.8* 8.7* 8.9 8.7*  MG  --   --   --  2.0 1.9   Liver Function Tests: Recent Labs  Lab 04/27/18 1123 04/30/18 1333 05/01/18 0545 05/02/18 0402  AST 26 20 19 23   ALT 16 17 19 24   ALKPHOS 66 67 62 56  BILITOT 0.6 0.6 0.5 0.6  PROT 5.8* 5.8* 6.3* 5.7*  ALBUMIN 3.0* 2.9* 3.0* 2.9*   No results for input(s): LIPASE, AMYLASE in the last 168 hours. No results for input(s): AMMONIA in the last 168 hours. CBC: Recent Labs  Lab 04/28/18 0427 04/29/18 0914 04/30/18 1333 05/01/18 0545 05/02/18 0402  WBC 9.0 10.1 5.3 10.2 9.2  HGB 9.9* 10.6* 10.5* 10.7* 9.9*  HCT 29.7* 32.7* 32.3* 32.7* 30.2*  MCV 94.0 95.6 93.9 94.0 93.8  PLT 346 326 358 371 348   Cardiac Enzymes: Recent Labs  Lab 04/27/18 1123  TROPONINI <0.03   BNP: BNP (last 3 results) Recent Labs    03/17/18 1603 04/27/18 1123 04/30/18 1333  BNP 626.2* 289.4* 352.0*    ProBNP (last 3 results) No results for input(s): PROBNP in the last 8760 hours.  CBG: No results for input(s): GLUCAP in the last 168 hours.     Signed:  Fayrene Helper MD.  [facility] 05/02/2018, 2:57 PM

## 2018-05-02 NOTE — Progress Notes (Signed)
Pt being discharged to [place] via [facility] and Biomedical scientist, niece present and pt belonging with her, vss, pt 02 2l/[place] sat 92% , copy of avs to niece as requested, packet with PTAR and DNR gold form as well, called friends home [place] to give report given to [place], all questions entertained and answerd.

## 2018-05-02 NOTE — NC FL2 (Signed)
[place] MEDICAID FL2 LEVEL OF CARE SCREENING TOOL     IDENTIFICATION  Patient Name: Katherine [name] Birthdate: [birth year] Sex: female Admission Date (Current Location): 04/27/2018  [facility] and [place] Number:  Herbalist and Address:  [facility] [place], [place] [address] [place], [place] [address]      Provider Number: [id number]  Attending Physician Name and Address:  Elodia Florence., *  Relative Name and Phone Number:       Current Level of Care: Hospital Recommended Level of Care: [place] Prior Approval Number:    Date Approved/Denied:   PASRR Number: [id number]  Discharge Plan: SNF    Current Diagnoses: Patient Active Problem List   Diagnosis Date Noted  . Pressure injury of skin 04/28/2018  . Acute on chronic diastolic (congestive) heart failure ([place]) 04/27/2018  . Diastolic CHF ([place]) 95/18/8416  . Fatigue 04/25/2018  . Edema 04/03/2018  . Acute on chronic respiratory failure with hypoxia ([place]) 03/17/2018  . Atrial fibrillation ([place]) 03/17/2018  . Volume overload 03/17/2018  . Chronic anemia 03/17/2018  . CKD (chronic kidney disease) stage 3, GFR 30-59 ml/min (HCC) 03/17/2018  . Breast cancer ([place]) 03/16/2013  . Paget's disease and intraductal carcinoma of left breast ([place]) 03/16/2013  . Aftercare following surgery of the circulatory system, [place] 01/03/2013  . COPD (chronic obstructive pulmonary disease) with acute bronchitis ([place]) 03/16/2012  . Altered mental status 03/16/2012  . Occlusion and stenosis of carotid artery without mention of cerebral infarction 01/06/2012  . Bronchiectasis without acute exacerbation ([place]) 08/20/2011  . Atypical mycobacterial disease 08/20/2011  . Hypothyroidism 07/24/2011  . Hyperlipidemia 07/24/2011  . Hypertension 07/24/2011  . CAP (community acquired pneumonia) 07/23/2011  . COPD (chronic obstructive pulmonary disease) ([place]) 07/23/2011  . Hemoptysis 07/23/2011     Orientation RESPIRATION BLADDER Height & Weight     Self, Time, Situation, Place  O2(Nasal Canula 3 L) Continent Weight: 101 lb 13.6 oz (46.2 kg) Height:  4\' 10"  (147.3 cm)  BEHAVIORAL SYMPTOMS/MOOD NEUROLOGICAL BOWEL NUTRITION STATUS  (None) (None) Incontinent Diet(Heart healthy)  AMBULATORY STATUS COMMUNICATION OF NEEDS Skin   Limited Assist Verbally PU Stage and Appropriate Care, Bruising, Other (Comment)(Cracking.) PU Stage 1 Dressing: (Medial mid back: Foam prn.)                     Personal Care Assistance Level of Assistance  Bathing, Feeding, Dressing Bathing Assistance: Limited assistance Feeding assistance: Independent Dressing Assistance: Limited assistance     Functional Limitations Info  Sight, Hearing, Speech Sight Info: Adequate Hearing Info: Adequate Speech Info: Adequate    SPECIAL CARE FACTORS FREQUENCY  PT (By licensed PT), OT (By licensed OT)     PT Frequency: 5 x week OT Frequency: 5 x week            Contractures Contractures Info: Not present    Additional Factors Info  Code Status, Allergies Code Status Info: DNR Allergies Info: Sulfa Antibiotics, Sulfamethoxazole, Amoxicillin, Penicillin G, Penicillins, Tape           Current Medications (05/02/2018):  This is the current hospital active medication list Current Facility-Administered Medications  Medication Dose Route Frequency Provider Last Rate Last Dose  . 0.9 %  sodium chloride infusion  250 mL Intravenous PRN Karmen Bongo, MD   Stopped at 04/29/18 1912  . acetaminophen (TYLENOL) tablet 650 mg  650 mg Oral Q4H PRN Karmen Bongo, MD   650 mg at 05/01/18 2331  .  albuterol (PROVENTIL) (2.5 MG/3ML) 0.083% nebulizer solution 2.5 mg  2.5 mg Nebulization Q4H PRN Elodia Florence., MD      . Derrill Memo ON 05/03/2018] amiodarone (PACERONE) tablet 400 mg  400 mg Oral Daily Daune Perch, NP      . apixaban Arne [place]) tablet 2.5 mg  2.5 mg Oral BID Elodia Florence., MD   2.5  mg at 05/02/18 0851  . carbamide peroxide (DEBROX) 6.5 % OTIC (EAR) solution 5 drop  5 drop Right EAR Daily PRN Elodia Florence., MD   5 drop at 05/02/18 [phone]  . cefTRIAXone (ROCEPHIN) 1 g in sodium chloride 0.9 % 100 mL IVPB  1 g Intravenous Q24H Monica Becton, MD   Stopped at 05/01/18 1630  . diltiazem (CARDIZEM CD) 24 hr capsule 120 mg  120 mg Oral Daily Karmen Bongo, MD   120 mg at 05/02/18 0851  . feeding supplement (ENSURE ENLIVE) (ENSURE ENLIVE) liquid 237 mL  237 mL Oral BID BM Elodia Florence., MD      . fluticasone [facility]) 50 MCG/ACT nasal spray 2 spray  2 spray Each Nare Daily Karmen Bongo, MD   2 spray at 05/02/18 0849  . furosemide (LASIX) tablet 40 mg  40 mg Oral Daily Croitoru, Mihai, MD   40 mg at 05/02/18 0850  . ipratropium-albuterol (DUONEB) 0.5-2.5 (3) MG/3ML nebulizer solution 3 mL  3 mL Nebulization TID Monica Becton, MD   3 mL at 05/02/18 0807  . levothyroxine (SYNTHROID, LEVOTHROID) tablet 100 mcg  100 mcg Oral Q0600 Elodia Florence., MD   100 mcg at 05/02/18 0556  . metoprolol tartrate (LOPRESSOR) tablet 12.5 mg  12.5 mg Oral BID Karmen Bongo, MD   12.5 mg at 05/02/18 0851  . mometasone-formoterol (DULERA) 200-5 MCG/ACT inhaler 2 puff  2 puff Inhalation BID Karmen Bongo, MD   2 puff at 05/02/18 0807  . ondansetron (ZOFRAN) injection 4 mg  4 mg Intravenous Q6H PRN Karmen Bongo, MD      . pantoprazole (PROTONIX) EC tablet 40 mg  40 mg Oral Daily Karmen Bongo, MD   40 mg at 05/02/18 0850  . polyethylene glycol (MIRALAX / GLYCOLAX) packet 17 g  17 g Oral Daily Elodia Florence., MD   17 g at 05/01/18 1059  . predniSONE (DELTASONE) tablet 40 mg  40 mg Oral Q breakfast Elodia Florence., MD   40 mg at 05/02/18 0841  . sodium chloride flush (NS) 0.9 % injection 3 mL  3 mL Intravenous Q12H Karmen Bongo, MD   3 mL at 05/02/18 0853  . sodium chloride flush (NS) 0.9 % injection 3 mL  3 mL Intravenous PRN Karmen Bongo, MD          Discharge Medications: Please see discharge summary for a list of discharge medications.  Relevant Imaging Results:  Relevant Lab Results:   Additional Information SS#: [id number]  Candie Chroman, LCSW

## 2018-05-02 NOTE — Progress Notes (Addendum)
SATURATION QUALIFICATIONS: (This note is used to comply with regulatory documentation for home oxygen)  Patient Saturations on Room Air at Rest = 80% 2l /[place] 88%  Patient Saturations on Room Air while Ambulating = NT due to base line  Patient Saturations on pt was 84% on 2/[place] sitting on side of bed, increased to 3l/[place] encouraged deep breating93% , while walking to door 91% on 3lnc  Please briefly explain why patient needs home oxygen pt ra sat is in 80's it is 3l/[place] to keep at  Ordered sat:

## 2018-05-02 NOTE — Progress Notes (Addendum)
Progress Note  Patient Name: Katherine [name] Date of Encounter: 05/02/2018  Primary Cardiologist: Pixie Casino, MD   Subjective   Pt is feeling better with no chest discomfort or shortness of breath. She says she is not quite back to normal as she is still feeling weak.   Inpatient Medications    Scheduled Meds: . amiodarone  400 mg Oral BID  . apixaban  2.5 mg Oral BID  . diltiazem  120 mg Oral Daily  . feeding supplement (ENSURE ENLIVE)  237 mL Oral BID BM  . fluticasone  2 spray Each Nare Daily  . furosemide  40 mg Oral Daily  . ipratropium-albuterol  3 mL Nebulization TID  . levothyroxine  100 mcg Oral Q0600  . metoprolol tartrate  12.5 mg Oral BID  . mometasone-formoterol  2 puff Inhalation BID  . pantoprazole  40 mg Oral Daily  . polyethylene glycol  17 g Oral Daily  . predniSONE  40 mg Oral Q breakfast  . sodium chloride flush  3 mL Intravenous Q12H   Continuous Infusions: . sodium chloride Stopped (04/29/18 1912)  . cefTRIAXone (ROCEPHIN)  IV Stopped (05/01/18 1630)   PRN Meds: sodium chloride, acetaminophen, albuterol, carbamide peroxide, ondansetron (ZOFRAN) IV, sodium chloride flush   Vital Signs    Vitals:   05/02/18 0306 05/02/18 0500 05/02/18 0807 05/02/18 0848  BP:    115/64  Pulse:    76  Resp:      Temp:      TempSrc:      SpO2: 93%  94% 95%  Weight:  46.2 kg    Height:        Intake/Output Summary (Last 24 hours) at 05/02/2018 1251 Last data filed at 05/02/2018 1244 Gross per 24 hour  Intake 1546 ml  Output 1375 ml  Net 171 ml   Last 3 Weights 05/02/2018 05/01/2018 04/30/2018  Weight (lbs) 101 lb 13.6 oz 101 lb 8 oz 101 lb 9.6 oz  Weight (kg) 46.2 kg 46.04 kg 46.085 kg      Telemetry    Atrial fib 70's-90's - Personally Reviewed  ECG    No new tracings - Personally Reviewed  Physical Exam   GEN: Frail, elderly female, No acute distress.   Neck: No JVD Cardiac: Irregularly irregular rhythm, no murmurs, rubs, or gallops.   Respiratory: Clear to auscultation bilaterally. GI: Soft, nontender, non-distended  MS: No edema; No deformity. Neuro:  Nonfocal  Psych: Normal affect   Labs    Chemistry Recent Labs  Lab 04/30/18 1333 05/01/18 0545 05/02/18 0402  NA 129* 134* 133*  K 4.0 3.9 4.4  CL 96* 95* 96*  CO2 29 29 27   GLUCOSE 156* 140* 131*  BUN 27* 34* 43*  CREATININE 1.21* 1.39* 1.50*  CALCIUM 8.7* 8.9 8.7*  PROT 5.8* 6.3* 5.7*  ALBUMIN 2.9* 3.0* 2.9*  AST 20 19 23   ALT 17 19 24   ALKPHOS 67 62 56  BILITOT 0.6 0.5 0.6  GFRNONAA 37* 31* 29*  GFRAA 43* 36* 33*  ANIONGAP 4* 10 10     Hematology Recent Labs  Lab 04/30/18 1333 05/01/18 0545 05/02/18 0402  WBC 5.3 10.2 9.2  RBC 3.44* 3.48* 3.22*  HGB 10.5* 10.7* 9.9*  HCT 32.3* 32.7* 30.2*  MCV 93.9 94.0 93.8  MCH 30.5 30.7 30.7  MCHC 32.5 32.7 32.8  RDW 14.2 14.4 14.2  PLT 358 371 348    Cardiac Enzymes Recent Labs  Lab 04/27/18 1123  TROPONINI <  0.03   No results for input(s): TROPIPOC in the last 168 hours.   BNP Recent Labs  Lab 04/27/18 1123 04/30/18 1333  BNP 289.4* 352.0*     DDimer No results for input(s): DDIMER in the last 168 hours.   Radiology    Dg Chest 2 View  Result Date: 05/02/2018 CLINICAL DATA:  Follow-up recent pneumonia. EXAM: CHEST - 2 VIEW COMPARISON:  05/01/2018 and 03/17/2018 as well as recent chest CT 04/29/2018 FINDINGS: Lungs are adequately inflated demonstrate stable patchy density over the right upper lobe known to represent area of scarring with associated suspicious 1.4 cm nodule by recent CT. Stable small to moderate right pleural effusion and stable small left pleural effusion. Likely associated bibasilar atelectasis. Infection in the lung bases is possible. Cardiomediastinal silhouette and remainder of the exam is unchanged. IMPRESSION: Stable patchy density in the right upper lobe known to represent scarring with associated suspicious 1.4 cm nodule by recent chest CT. Stable bilateral  pleural effusions likely with associated basilar atelectasis. Infection in the lung bases is possible. Electronically Signed   By: Marin Olp M.D.   On: 05/02/2018 09:07   Dg Chest 2 View  Result Date: 05/01/2018 CLINICAL DATA:  Hypoxia. EXAM: CHEST - 2 VIEW COMPARISON:  04/29/2018 FINDINGS: Improved aeration in the left lower chest is suggestive for decreasing pleural fluid. Cardiac silhouette remains enlarged. Right pleural effusion may slightly enlarged. Again noted are patchy densities in the right mid and upper lung. Severe kyphotic deformity near the thoracolumbar spine with probable compression deformity. Difficult to evaluate for interval change in the spine. IMPRESSION: Slightly increased patchy densities in the right lung are worrisome for pneumonia. Slightly increased right pleural fluid. Improved aeration in the left lower chest may represent decreased left pleural effusion. Electronically Signed   By: Markus Daft M.D.   On: 05/01/2018 08:21    Cardiac Studies   Echo 03/18/2018  1. Normal left ventricular size and systolic function. 2. Grade 2 diastolic dysfunction with elevated left atrial pressure. 3. Mildly dilated left atrial size. 4. Normal right atrial size. 5. Normal tricuspid valve. 6. Tricuspid regurgitation is mild. 7. The aortic valve tricuspid. There is mild thickening and mild calcification of the aortic valve. 8. The inferior vena cava was dilated in size with >50% respiratory variablity. 9. No atrial level shunt detected by color flow Doppler.  FINDINGS Left Ventricle: No evidence of left ventricular regional wall motion abnormalities. The left ventricle has normal systolic function of 62-95%. The cavity size is normal. There is normal left ventricular wall thickness. Echo evidence of pseudonormal  diastolic filling patterns. Elevated mean left atrial pressure. Right Ventricle: The right ventricle is normal in size. There is normal wall thickness.  There is normal systolic function. Right ventricular systolic pressure is normal with an estimated pressure of 49.0 mmHg....  Patient Profile     85 y.o. female with chronic respiratory failure w hypoxia due to COPD admitted w relapse of acute diastolic CHF exacerbation in setting of recurrent atrial fibrillation. Additional issues include history of CVA and carotid disease s/p L CEA, HTN, HLP.  Assessment & Plan    Acute diastolic CHF -Pt has been adequately diuresed and switched to oral lasix 40 mg daily. Wt is stable today at 101 lbs. Pt believes her baseline wt is 99 lbs.  -BUN and Cr are up a little today, may be getting a little dry.  -No chest discomfort or shortness of breath, still weak.  -Pt may be  ready for discharge back to her facility.   Persistent atrial fibrillation -Rate controlled on amiodarone 400 mg BID, diltiazem 120 mg daily. Rates 70's-90's. Amiodarone was started on 3/14. Decrease to 400 mg daily until 05/30/18 then decrease to 200 mg daily.  -On apixaban 2.5 mg BID, reduced dose for wt <60kg and age.  -Cardioversion was deferred on this admission due missing 2 doses of Eliquis. Can consider cardioversion in the future once uninterrupted anticoagulation for 3-4 weeks.    For questions or updates, please contact [place] Please consult [email or web address] for contact info under     Signed, Daune Perch, NP  05/02/2018, 12:51 PM    I have seen and examined the patient along with Daune Perch, NP .  I have reviewed the chart, notes and new data.  I agree with PA/NP's note.  Key new complaints: weak, not dyspneic Key examination changes: irregular rhythm, rate-controlled Key new findings / data: BUN and creat a little higher.  PLAN: Amiodarone 400 mg daily to complete one month, then reduce to 200 mg daily on April 14. Elective DCCV after 3-4 weeks of uninterrupted anticoagulation. Daily weights, call our office if wt >103 lb.  Sanda Klein, MD, [place] [phone] 05/02/2018, 1:04 PM

## 2018-05-02 NOTE — Plan of Care (Signed)

## 2018-05-02 NOTE — TOC Transition Note (Signed)
Transition of Care [facility]) - CM/SW Discharge Note   Patient Details  Name: JEREMY MCLAMB MRN: [id number] Date of Birth: 12-07-1917  Transition of Care [facility]) CM/SW Contact:  Candie Chroman, LCSW Phone Number: 05/02/2018, 3:18 PM   Clinical Narrative:  CSW facilitated patient discharge including contacting patient family and facility to confirm patient discharge plans. Clinical information faxed to facility and family agreeable with plan. CSW arranged ambulance transport via [facility] to [facility] SNF. RN to call report prior to discharge [phone]).  CSW will sign off for now as social work intervention is no longer needed. Please consult [place] again if new needs arise.  Final next level of care: Skilled Nursing Facility Barriers to Discharge: Barriers Resolved   Patient Goals and CMS Choice   CMS Medicare.gov Compare Post Acute Care list provided to:: Other (Comment Required)(Patient returning to previous SNF.)    Discharge Placement   Existing PASRR number confirmed : 05/02/18          Patient chooses bed at: [facility] Patient to be transferred to facility by: [place] Name of family member notified: Timoteo Gaul Patient and family notified of of transfer: 05/02/18  Discharge Plan and Services   Post Acute Care Choice: [place]                    Social Determinants of Health (SDOH) Interventions     Readmission Risk Interventions Readmission Risk Prevention Plan 05/02/2018 04/28/2018  Transportation Screening - Complete  PCP or Specialist Appt within 3-5 Days - Not Complete  Not Complete comments - Will check closer to discharge.  [place] or Home Care Consult - Not Complete  Social Work Consult for [place] Planning/Counseling - Complete  Palliative Care Screening - Not Applicable  Medication Review (RN Care Manager) - Referral to Pharmacy  HRI or [place] Not Complete -  [place] or Home Care Consult Pt Refusal Comments Going to SNF -   SW Recovery Care/Counseling Consult Complete -  Palliative Care Screening Not Applicable -  Skilled Nursing Facility Complete -  Some recent data might be hidden

## 2018-05-02 NOTE — TOC Progression Note (Signed)
Transition of Care [facility]) - Progression Note    Patient Details  Name: MASA LUBIN MRN: [id number] Date of Birth: 1917/02/27  Transition of Care [facility]) CM/SW [place], LCSW Phone Number: 05/02/2018, 2:17 PM  Clinical Narrative:   Per MD, patient is stable for discharge today. Patient and SNF admissions coordinator are aware. CSW offered to call patient's niece but she said she had already notified her. RN said she would call her as well. Patient can discharge to SNF once discharge summary and orders are in. Patient stated she would need PTAR to transport her back to the facility.  Expected Discharge Plan: [place] Barriers to Discharge: Continued Medical Work up  Expected Discharge Plan and Services Expected Discharge Plan: [place] Choice: [place] Living arrangements for the past 2 months: [place], [place]                           Social Determinants of Health (SDOH) Interventions    Readmission Risk Interventions 30 Day Unplanned Readmission Risk Score     ED to Hosp-Admission (Current) from 04/27/2018 in [place] CHF  30 Day Unplanned Readmission Risk Score (%)  34 Filed at 05/02/2018 1200     This score is the patient's risk of an unplanned readmission within 30 days of being discharged (0 -100%). The score is based on dignosis, age, lab data, medications, orders, and past utilization.   Low:  0-14.9   Medium: 15-21.9   High: 22-29.9   Extreme: 30 and above       Readmission Risk Prevention Plan 05/02/2018 04/28/2018  Transportation Screening - Complete  PCP or Specialist Appt within 3-5 Days - Not Complete  Not Complete comments - Will check closer to discharge.  [place] or Home Care Consult - Not Complete  Social Work Consult for [place] Planning/Counseling - Complete  Palliative Care Screening - Not Applicable  Medication  Review (RN Care Manager) - Referral to Pharmacy  HRI or [place] Not Complete -  [place] or Home Care Consult Pt Refusal Comments Going to SNF -  SW Recovery Care/Counseling Consult Complete -  Palliative Care Screening Not Applicable -  Skilled Nursing Facility Complete -  Some recent data might be hidden

## 2018-05-03 ENCOUNTER — Telehealth: Payer: Self-pay | Admitting: Hematology and Oncology

## 2018-05-03 NOTE — Telephone Encounter (Signed)
Per 3/18 sch message - left message of patient to call back to r/s

## 2018-05-04 ENCOUNTER — Inpatient Hospital Stay: Payer: Medicare Other | Admitting: Hematology and Oncology

## 2018-05-04 LAB — CULTURE, BODY FLUID W GRAM STAIN -BOTTLE: Culture: NO GROWTH

## 2018-05-05 ENCOUNTER — Encounter: Payer: Self-pay | Admitting: Internal Medicine

## 2018-05-05 ENCOUNTER — Non-Acute Institutional Stay (SKILLED_NURSING_FACILITY): Payer: Medicare Other | Admitting: Internal Medicine

## 2018-05-05 DIAGNOSIS — I5033 Acute on chronic diastolic (congestive) heart failure: Secondary | ICD-10-CM

## 2018-05-05 DIAGNOSIS — J9621 Acute and chronic respiratory failure with hypoxia: Secondary | ICD-10-CM

## 2018-05-05 DIAGNOSIS — N183 Chronic kidney disease, stage 3 unspecified: Secondary | ICD-10-CM

## 2018-05-05 DIAGNOSIS — I4819 Other persistent atrial fibrillation: Secondary | ICD-10-CM | POA: Diagnosis not present

## 2018-05-05 DIAGNOSIS — E032 Hypothyroidism due to medicaments and other exogenous substances: Secondary | ICD-10-CM

## 2018-05-05 DIAGNOSIS — J440 Chronic obstructive pulmonary disease with acute lower respiratory infection: Secondary | ICD-10-CM | POA: Diagnosis not present

## 2018-05-05 NOTE — Progress Notes (Signed)
Provider:  Veleta Miners L,MD  Location:  [facility] Room Number: 77 Place of Service:  SNF ([phone])  PCP: [name], MD Patient Care Team: [name], MD as PCP - General (Internal Medicine) Debara Pickett Nadean Corwin, MD as PCP - Cardiology (Cardiology) Nicholas Lose, MD as Consulting Physician (Hematology and Oncology)  Extended Emergency Contact Information Primary Emergency Contact: Little Ishikawa Mobile Phone: [phone] Relation: Niece Secondary Emergency Contact: Cromer,Chad Mobile Phone: [phone] Relation: Nephew  Code Status:DNR Goals of Care: Advanced Directive information Advanced Directives 05/05/2018  Does Patient Have a Medical Advance Directive? No  Type of Advance Directive -  Does patient want to make changes to medical advance directive? No - Patient declined  Copy of [place] in Chart? -  Would patient like information on creating a medical advance directive? No - Patient declined  Pre-existing out of facility DNR order (yellow form or pink MOST form) -      Chief Complaint  Patient presents with   Readmit To SNF    Readmit to facility     HPI: Patient is a 85 y.o. female seen today for admission to SNF for therapy. Patient was readmitted in the Hospital from 03/12-03/17 For CHF, And Rapid A Fib  Patient has h/o H/o TIA in 2010 s/p CEA in 2010, COPD on oxygen at night, Hypertension,GERD,Hypothyroidism, Gastritis and Paget's disease of Nipple  She was initially admitted in the hospital from 01/31-02/04 nfor CAP and CHF with New onset atrial fibrillation.  Patient did well with therapy for few days but then she started to have weight gain shortness of breath and cough.  She was started on low-dose of Lasix.  But she continued to get worse with worsening A. fib with rapid ventricular response.  She was sent back to the hospital.  In the hospital she was treated with diuresis.  For her rapid A. fib she was started  on Cardizem.  Aspirin was discontinued and she was continued on Eliquis.  She was also started on amiodarone loading dose.  There is a plan for cardioversion as outpatient after 30 days of being on Eliquis Also was treated for COPD exacerbation/possible pneumonia with steroid taper and cefdinir. Due to her bilateral pleural effusion right more than left and some suspicious nodule on chest CT she went to right thoracocentesis.  That cytology is so far negative for any malignant cells. Patient was seen today after therapy.  She said she still feels weak and tired.  But her breathing is much better.  Has dry cough.  No shortness of breath.  No chest pain no fever Per nurses patient HR does go up to 110 with exertion but her sats are mostly 95% on 3 L. Patient had come to SNF from independent apartment.     Past Medical History:  Diagnosis Date   Arthritis    Constipation    COPD (chronic obstructive pulmonary disease) (HCC)    Fatigue    Frequent UTI    GERD (gastroesophageal reflux disease)    History of shingles 2007   Hyperlipemia    Hypertension    Hypothyroidism    Multiple allergies    Osteoporosis    Paget's carcinoma of the nipple ([place]) 03/16/2013   Pneumonia    Seasonal allergies    Stroke ([place]) 2010   no residual   Thyroid disease    Uterine cancer [facility])    Past Surgical History:  Procedure Laterality Date  ABDOMINAL HYSTERECTOMY     BREAST LUMPECTOMY     left breast   CAROTID ENDARTERECTOMY Left June 10, 2008   CE   EYE SURGERY     cataracts removed bilaterally   TONSILLECTOMY      reports that she quit smoking about 37 years ago. Her smoking use included cigarettes. She has a 40.00 pack-year smoking history. She has never used smokeless tobacco. She reports current alcohol use. She reports that she does not use drugs. Social History   Socioeconomic History   Marital status: Widowed    Spouse name: Not on file   Number of children:  Not on file   Years of education: Not on file   Highest education level: Not on file  Occupational History   Not on file  Social Needs   Financial resource strain: Not on file   Food insecurity:    Worry: Not on file    Inability: Not on file   Transportation needs:    Medical: Not on file    Non-medical: Not on file  Tobacco Use   Smoking status: Former Smoker    Packs/day: 1.00    Years: 40.00    Pack years: 40.00    Types: Cigarettes    Last attempt to quit: 02/15/1981    Years since quitting: 37.2   Smokeless tobacco: Never Used  Substance and Sexual Activity   Alcohol use: Yes   Drug use: No   Sexual activity: Not Currently  Lifestyle   Physical activity:    Days per week: Not on file    Minutes per session: Not on file   Stress: Not on file  Relationships   Social connections:    Talks on phone: Not on file    Gets together: Not on file    Attends religious service: Not on file    Active member of club or organization: Not on file    Attends meetings of clubs or organizations: Not on file    Relationship status: Not on file   Intimate partner violence:    Fear of current or ex partner: Not on file    Emotionally abused: Not on file    Physically abused: Not on file    Forced sexual activity: Not on file  Other Topics Concern   Not on file  Social History Narrative   Lives at [facility] Status Survey:    Family History  Problem Relation Age of Onset   Heart failure Mother    Heart disease Mother    Heart failure Father    Heart disease Father    Heart disease Brother    Asthma Paternal Grandmother     Health Maintenance  Topic Date Due   TETANUS/TDAP  10/03/1936   PNA vac Low Risk Adult (1 of 2 - PCV13) 10/02/1984   INFLUENZA VACCINE  09/15/2017   DEXA SCAN  Completed    Allergies  Allergen Reactions   Sulfa Antibiotics Shortness Of Breath and Palpitations   Sulfamethoxazole Shortness Of  Breath and Palpitations   Amoxicillin Rash   Penicillin G Rash    Did it involve swelling of the face/tongue/throat, SOB, or low BP? Yes Did it involve sudden or severe rash/hives, skin peeling, or any reaction on the inside of your mouth or nose? Unk Did you need to seek medical attention at a hospital or doctor's office? Unk When did it last happen? "it was a long time ago" If all  above answers are NO, may proceed with cephalosporin use.    Penicillins Rash    Did it involve swelling of the face/tongue/throat, SOB, or low BP? Yes Did it involve sudden or severe rash/hives, skin peeling, or any reaction on the inside of your mouth or nose? Unk Did you need to seek medical attention at a hospital or doctor's office? Unk When did it last happen? "it was a long time ago" If all above answers are NO, may proceed with cephalosporin use.    Tape Other (See Comments)    SKIN IS VERY FRAGILE- PLEASE USE AN ALTERNATIVE TO TAPE, AS THE SKIN TEARS EASILY!!    Outpatient Encounter Medications as of 05/05/2018  Medication Sig   amiodarone (PACERONE) 400 MG tablet Take 1 tablet (400 mg total) by mouth daily for 27 days. (decrease to 200 mg daily on April 14th)   apixaban (ELIQUIS) 2.5 MG TABS tablet Take 1 tablet (2.5 mg total) by mouth 2 (two) times daily.   cefdinir (OMNICEF) 300 MG capsule Take 1 capsule (300 mg total) by mouth daily for 4 days.   diltiazem (CARDIZEM CD) 120 MG 24 hr capsule Take 1 capsule (120 mg total) by mouth daily.   ergocalciferol (VITAMIN D2) 1.25 MG (50000 UT) capsule Take 1,250 Units by mouth once a week. Once a day on Sunday   fluticasone (FLONASE) 50 MCG/ACT nasal spray Place 2 sprays into the nose daily.   Fluticasone-Salmeterol (ADVAIR) 250-50 MCG/DOSE AEPB Inhale 1 puff into the lungs every 12 (twelve) hours.   furosemide (LASIX) 40 MG tablet Take 1 tablet (40 mg total) by mouth daily for 30 days.   ipratropium-albuterol (DUONEB) 0.5-2.5 (3) MG/3ML  SOLN Take 3 mLs by nebulization every 6 (six) hours as needed (WHEEZING).   levothyroxine (SYNTHROID, LEVOTHROID) 100 MCG tablet Take 1 tablet (100 mcg total) by mouth daily at 6 (six) AM for 30 days.   metoprolol tartrate (LOPRESSOR) 25 MG tablet Take 12.5 mg by mouth 2 (two) times daily.   Multiple Vitamins-Minerals (CENTRUM SILVER PO) Take 1 tablet by mouth daily.   Multiple Vitamins-Minerals (SYSTANE ICAPS AREDS2) CAPS Take 1 capsule by mouth daily.    OXYGEN Inhale 2 L into the lungs continuous.   pantoprazole (PROTONIX) 40 MG tablet Take 40 mg by mouth daily.    predniSONE (DELTASONE) 20 MG tablet Take 2 tablets (40 mg total) by mouth daily with breakfast for 1 day, THEN 1.5 tablets (30 mg total) daily with breakfast for 2 days, THEN 1 tablet (20 mg total) daily with breakfast for 2 days, THEN 0.5 tablets (10 mg total) daily with breakfast for 2 days.   saccharomyces boulardii (FLORASTOR) 250 MG capsule Take 250 mg by mouth 2 (two) times daily.   tiotropium (SPIRIVA) 18 MCG inhalation capsule Place 18 mcg into inhaler and inhale daily.   zinc oxide 20 % ointment Apply 1 application topically as needed for irritation. Apply to buttocks after every incontinent episode and as needed for redness   No facility-administered encounter medications on file as of 05/05/2018.     Review of Systems  Constitutional: Positive for activity change and appetite change.  HENT: Negative.   Respiratory: Positive for cough and shortness of breath.   Cardiovascular: Negative for leg swelling.  Gastrointestinal: Negative.   Genitourinary: Negative.   Musculoskeletal: Negative.   Skin: Negative.   Neurological: Positive for weakness.  Psychiatric/Behavioral: Negative.     Vitals:   05/05/18 1108  BP: (!) 113/58  Pulse: 77  Resp: 18  Temp: (!) 97.2 F (36.2 C)  SpO2: 94%  Weight: 107 lb 8 oz (48.8 kg)   Body mass index is 22.47 kg/m. Physical Exam Vitals signs reviewed.    Constitutional:      Appearance: Normal appearance.  HENT:     Head: Normocephalic.     Mouth/Throat:     Mouth: Mucous membranes are moist.     Pharynx: Oropharynx is clear.  Eyes:     Pupils: Pupils are equal, round, and reactive to light.  Neck:     Musculoskeletal: Neck supple.  Cardiovascular:     Rate and Rhythm: Normal rate. Rhythm irregular.     Heart sounds: Normal heart sounds. No murmur.  Pulmonary:     Effort: Pulmonary effort is normal. No respiratory distress.     Breath sounds: No wheezing or rales.  Abdominal:     General: Abdomen is flat. Bowel sounds are normal. There is no distension.     Palpations: Abdomen is soft.     Tenderness: There is no abdominal tenderness.  Musculoskeletal:     Comments: Mild swelling Bilateral  Skin:    General: Skin is warm and dry.  Neurological:     General: No focal deficit present.     Mental Status: She is alert and oriented to person, place, and time.     Labs reviewed: Basic Metabolic Panel: Recent Labs    03/17/18 1753  04/30/18 1333 05/01/18 0545 05/02/18 0402  NA  --    < > 129* 134* 133*  K  --    < > 4.0 3.9 4.4  CL  --    < > 96* 95* 96*  CO2  --    < > 29 29 27   GLUCOSE  --    < > 156* 140* 131*  BUN  --    < > 27* 34* 43*  CREATININE  --    < > 1.21* 1.39* 1.50*  CALCIUM  --    < > 8.7* 8.9 8.7*  MG 1.7  --   --  2.0 1.9   < > = values in this interval not displayed.   Liver Function Tests: Recent Labs    04/30/18 1333 05/01/18 0545 05/02/18 0402  AST 20 19 23   ALT 17 19 24   ALKPHOS 67 62 56  BILITOT 0.6 0.5 0.6  PROT 5.8* 6.3* 5.7*  ALBUMIN 2.9* 3.0* 2.9*   No results for input(s): LIPASE, AMYLASE in the last 8760 hours. No results for input(s): AMMONIA in the last 8760 hours. CBC: Recent Labs    03/17/18 1604  04/30/18 1333 05/01/18 0545 05/02/18 0402  WBC 17.1*   < > 5.3 10.2 9.2  NEUTROABS 12.8*  --   --   --   --   HGB 11.3*   < > 10.5* 10.7* 9.9*  HCT 34.5*   < > 32.3*  32.7* 30.2*  MCV 93.8   < > 93.9 94.0 93.8  PLT 253   < > 358 371 348   < > = values in this interval not displayed.   Cardiac Enzymes: Recent Labs    03/17/18 1604 04/27/18 1123  TROPONINI <0.03 <0.03   BNP: Invalid input(s): POCBNP No results found for: HGBA1C Lab Results  Component Value Date   TSH 10.029 (H) 04/27/2018   Lab Results  Component Value Date   CHENIDPO24 235 04/07/2018   No results found for: FOLATE Lab Results  Component Value Date  IRON 78 04/06/2018   TIBC 244 04/06/2018    Imaging and Procedures obtained prior to SNF admission: Dg Chest Port 1 View  Result Date: 04/28/2018 CLINICAL DATA:  Shortness of breath. EXAM: PORTABLE CHEST 1 VIEW COMPARISON:  04/27/2018 and prior exams FINDINGS: Cardiomegaly, pulmonary vascular congestion, interstitial pulmonary edema, bilateral pleural effusions, RIGHT greater than LEFT, and bilateral LOWER lung atelectasis again noted. No pneumothorax. No significant change from the prior study. IMPRESSION: Unchanged appearance of the chest with pulmonary edema, bilateral pleural effusions, RIGHT greater than LEFT, and bibasilar atelectasis. Electronically Signed   By: Margarette Canada M.D.   On: 04/28/2018 09:54   Dg Chest Portable 1 View  Result Date: 04/27/2018 CLINICAL DATA:  Shortness of breath. EXAM: PORTABLE CHEST 1 VIEW COMPARISON:  Radiograph March 17, 2018. FINDINGS: Stable cardiomegaly. Atherosclerosis of thoracic aorta is noted. No pneumothorax is noted. Interval development of mild to moderate bilateral pleural effusions are noted. Bilateral interstitial densities are noted which may represent edema. Bony thorax is unremarkable. IMPRESSION: Stable cardiomegaly with probable bilateral pulmonary edema and interval development of mild to moderate bilateral pleural effusions. Electronically Signed   By: Marijo Conception, M.D.   On: 04/27/2018 13:35    Assessment/Plan Acute on chronic respiratory failure with hypoxia and  Pleural Effusion With Possible suspicious Upper right Lobe nodule ? Bronchogenic carcinoma Discussed with the patient She is aware. She said that at this time she does not want anything done.  Acute on chronic diastolic (congestive) heart failure  On Lasix Daily Weights Repeat BMP   Atrial fibrillation On Amiodarone loading dose On Eliquis Possible Cardioversion by Cardiology after 30 days Make follow up appointment  COPD with Infection On Cefdinir Prednisone taper On oxygen 3 l  Continue Duo Nebs and SPiriva Hypothyroidism  Synthyroid incrased Check TSH in 8 weeks  Right pleural effusion S/p thoracocentesis Most of the cultures are negative so far we will continue to follow  CKD (chronic kidney disease) stage 3, GFR 30-59 ml/min (HCC) BUN and creatinine are slightly elevated most likely due to diuresis Will repeat BMP Potassium stable Disposition Patient will continue to work with therapy She was aware that there is a chance she would not be able to go back to her apartment.  At this time she is DNR if she continues to worsen we will get hospice involved. Anemia Repeat CBC Hgb Stable [employer]   Pharmacist, hospital Communication:   Labs/tests ordered:  Total time spent in this patient care encounter was 45_ minutes; greater than 50% of the visit spent counseling patient, reviewing records , Labs and coordinating care for problems addressed at this encounter.

## 2018-05-11 LAB — HEPATIC FUNCTION PANEL
ALT: 12 (ref 7–35)
AST: 11 — AB (ref 13–35)
Alkaline Phosphatase: 56 (ref 25–125)
BILIRUBIN, TOTAL: 0.4

## 2018-05-11 LAB — BASIC METABOLIC PANEL
BUN: 26 — AB (ref 4–21)
Creatinine: 1.2 — AB (ref 0.5–1.1)
GLUCOSE: 87
Potassium: 4.2 (ref 3.4–5.3)
Sodium: 140 (ref 137–147)

## 2018-05-11 LAB — CBC AND DIFFERENTIAL
HCT: 32 — AB (ref 36–46)
Hemoglobin: 10.8 — AB (ref 12.0–16.0)
Platelets: 282 (ref 150–399)
WBC: 12.1

## 2018-05-12 ENCOUNTER — Encounter: Payer: Self-pay | Admitting: Family

## 2018-05-12 ENCOUNTER — Non-Acute Institutional Stay (SKILLED_NURSING_FACILITY): Payer: Medicare Other | Admitting: Family

## 2018-05-12 DIAGNOSIS — D72829 Elevated white blood cell count, unspecified: Secondary | ICD-10-CM | POA: Diagnosis not present

## 2018-05-12 DIAGNOSIS — N183 Chronic kidney disease, stage 3 unspecified: Secondary | ICD-10-CM

## 2018-05-12 DIAGNOSIS — I5032 Chronic diastolic (congestive) heart failure: Secondary | ICD-10-CM | POA: Diagnosis not present

## 2018-05-12 DIAGNOSIS — J449 Chronic obstructive pulmonary disease, unspecified: Secondary | ICD-10-CM

## 2018-05-12 DIAGNOSIS — D649 Anemia, unspecified: Secondary | ICD-10-CM | POA: Diagnosis not present

## 2018-05-12 NOTE — Progress Notes (Signed)
Location:  [place] Room Number: 69 Place of Service:  SNF [id number]) Provider: Haddie Bruhl FNP-C  [name], MD  Patient Care Team: [name], MD as PCP - General (Internal Medicine) Debara Pickett Nadean Corwin, MD as PCP - Cardiology (Cardiology) Nicholas Lose, MD as Consulting Physician (Hematology and Oncology)  Extended Emergency Contact Information Primary Emergency Contact: Little Ishikawa Mobile Phone: [phone] Relation: Niece Secondary Emergency Contact: Cromer,Chad Mobile Phone: [phone] Relation: Nephew  Code Status:  DNR Goals of care: Advanced Directive information Advanced Directives 05/12/2018  Does Patient Have a Medical Advance Directive? No  Type of Advance Directive -  Does patient want to make changes to medical advance directive? No - Patient declined  Copy of [place] in Chart? -  Would patient like information on creating a medical advance directive? -  Pre-existing out of facility DNR order (yellow form or pink MOST form) -     Chief Complaint  Patient presents with   Acute Visit    Abnormal labs     HPI:  Pt is a 85 y.o. female seen today at [facility] for an acute visit for evaluation of abnormal lab results.she is seen in her room today up on her wheelchair.she denies any acute issues during visit.she states feeling much better.Breathing has improved.she tells me has been able to do most care by herself.shortness of breath has improved overall though still requires continuous via nasal cannula.Facility Nurse reports no new concerns.Her recent CBC results showed elevated WBC 12.1 ( previous 9.2),Hgb 10.8( previous 9.9),HCT 32.2,BUN 26,CR 1.18 ( previous 43,1.50) 05/11/2018.Of note she is status post hospital admission 04/27/2018-05/02/2018 with acute on chronic diastolic congestive Heart failure,COPD exacerbation with possible pneumonia,bilateral pleural effusion and Afib.she has completed cefdinir 7  days course and tapered prednisone.Lab results reviewed with MD thought WBC could be from recent treatment with prednisone.she denies any fever,chills,cough or worsening edema.No abrupt weight gain.Nurse states no new concerns.        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   Pneumonia    Seasonal allergies    Stroke ([place]) 2010   no residual   Thyroid disease    Uterine cancer ([place])    Past Surgical History:  Procedure Laterality Date   ABDOMINAL HYSTERECTOMY     BREAST LUMPECTOMY     left breast   CAROTID ENDARTERECTOMY Left June 10, 2008   CE   EYE SURGERY     cataracts removed bilaterally   TONSILLECTOMY      Allergies  Allergen Reactions   Sulfa Antibiotics Shortness Of Breath and Palpitations   Sulfamethoxazole Shortness Of Breath and Palpitations   Amoxicillin Rash   Penicillin G Rash    Did it involve swelling of the face/tongue/throat, SOB, or low BP? Yes Did it involve sudden or severe rash/hives, skin peeling, or any reaction on the inside of your mouth or nose? Unk Did you need to seek medical attention at a hospital or doctor's office? Unk When did it last happen? "it was a long time ago" If all above answers are  NO, may proceed with cephalosporin use.    Penicillins Rash    Did it involve swelling of the face/tongue/throat, SOB, or low BP? Yes Did it involve sudden or severe rash/hives, skin peeling, or any reaction on the inside of your mouth or nose? Unk Did you need to seek medical attention at a hospital or doctor's office? Unk When did it last happen? "it was a long time ago" If all above answers are  NO, may proceed with cephalosporin use.    Tape Other (See Comments)    SKIN IS VERY FRAGILE- PLEASE USE AN ALTERNATIVE TO TAPE, AS THE SKIN TEARS EASILY!!    Outpatient Encounter Medications as of 05/12/2018  Medication Sig   amiodarone (PACERONE) 400 MG tablet Take 1 tablet (400 mg total) by mouth daily for 27 days. (decrease to 200 mg daily on April 14th)   apixaban (ELIQUIS) 2.5 MG TABS tablet Take 1 tablet (2.5 mg total) by mouth 2 (two) times daily.   diltiazem (CARDIZEM CD) 120 MG 24 hr capsule Take 1 capsule (120 mg total) by mouth daily.   ergocalciferol (VITAMIN D2) 1.25 MG (50000 UT) capsule Take 1,250 Units by mouth once a week. Once a day on Sunday   fluticasone (FLONASE) 50 MCG/ACT nasal spray Place 2 sprays into the nose daily.   Fluticasone-Salmeterol (ADVAIR) 250-50 MCG/DOSE AEPB Inhale 1 puff into the lungs every 12 (twelve) hours.   furosemide (LASIX) 40 MG tablet Take 1 tablet (40 mg total) by mouth daily for 30 days.   ipratropium-albuterol (DUONEB) 0.5-2.5 (3) MG/3ML SOLN Take 3 mLs by nebulization every 6 (six) hours as needed (WHEEZING).   levothyroxine (SYNTHROID, LEVOTHROID) 100 MCG tablet Take 1 tablet (100 mcg total) by mouth daily at 6 (six) AM for 30 days.   metoprolol tartrate (LOPRESSOR) 25 MG tablet Take 12.5 mg by mouth 2 (two) times daily.   Multiple Vitamins-Minerals (CENTRUM SILVER PO) Take 1 tablet by mouth daily.   Multiple Vitamins-Minerals (SYSTANE ICAPS AREDS2) CAPS Take 1 capsule by mouth daily.    OXYGEN Inhale 2 L into the lungs continuous.   pantoprazole (PROTONIX) 40 MG tablet Take 40 mg by mouth daily.    saccharomyces boulardii (FLORASTOR) 250 MG capsule Take 250 mg by mouth 2 (two) times daily.   tiotropium (SPIRIVA) 18 MCG inhalation capsule Place 18 mcg into inhaler and inhale daily.   zinc oxide 20 % ointment Apply 1 application topically as needed for irritation. Apply to buttocks after every incontinent episode and as  needed for redness   No facility-administered encounter medications on file as of 05/12/2018.     Review of Systems  Constitutional: Negative for activity change, appetite change, chills, fever and unexpected weight change.  HENT: Negative for congestion, rhinorrhea, sinus pressure, sinus pain, sneezing, sore throat and trouble swallowing.   Eyes: Positive for visual disturbance. Negative for pain, discharge, redness and itching.       Wears eye glasses   Respiratory: Negative for cough, chest tightness, shortness of breath and wheezing.        On oxygen   Cardiovascular: Positive for leg swelling. Negative for chest pain and palpitations.  Gastrointestinal: Negative for abdominal distention, abdominal pain, constipation, diarrhea, nausea and vomiting.  Genitourinary: Negative for difficulty urinating, dysuria, flank pain, frequency and urgency.  Musculoskeletal: Positive for gait problem.  Skin: Negative for color change, pallor and rash.  Neurological: Negative for dizziness, light-headedness and headaches.  Psychiatric/Behavioral: Negative for agitation, confusion and sleep disturbance. The patient  is not nervous/anxious.     Immunization History  Administered Date(s) Administered   Influenza-Unspecified 11/15/2013   PPD Test 07/24/2011   Zoster Recombinat (Shingrix) 12/10/2017   Pertinent  Health Maintenance Due  Topic Date Due   PNA vac Low Risk Adult (1 of 2 - PCV13) 10/02/1984   INFLUENZA VACCINE  09/15/2017   DEXA SCAN  Completed   Fall Risk  01/28/2014  Falls in the past year? Yes  Number falls in past yr: 1  Injury with Fall? No   Functional Status Survey:    Vitals:   05/12/18 0918  BP: 136/76  Pulse: 64  Resp: 18  Temp: (!) 97.5 F (36.4 C)  TempSrc: Oral  SpO2: 93%  Weight: 102 lb 12.8 oz (46.6 kg)  Height: 4\' 10"  (1.473 m)   Body mass index is 21.49 kg/m. Physical Exam Vitals signs and nursing note reviewed.  Constitutional:      General:  She is not in acute distress.    Appearance: She is normal weight. She is not ill-appearing.  HENT:     Head: Normocephalic.     Right Ear: Tympanic membrane, ear canal and external ear normal. There is no impacted cerumen.     Left Ear: Tympanic membrane, ear canal and external ear normal. There is no impacted cerumen.     Nose: No congestion or rhinorrhea.     Mouth/Throat:     Mouth: Mucous membranes are moist.     Pharynx: Oropharynx is clear. No oropharyngeal exudate or posterior oropharyngeal erythema.  Eyes:     General: No scleral icterus.       Right eye: No discharge.        Left eye: No discharge.     Conjunctiva/sclera: Conjunctivae normal.     Pupils: Pupils are equal, round, and reactive to light.     Comments: Corrective lens in place   Neck:     Musculoskeletal: Normal range of motion. No neck rigidity or muscular tenderness.  Cardiovascular:     Rate and Rhythm: Rhythm irregular.     Pulses: Normal pulses.     Heart sounds: Normal heart sounds. No murmur. No friction rub. No gallop.   Pulmonary:     Effort: Pulmonary effort is normal. No respiratory distress.     Breath sounds: No wheezing, rhonchi or rales.     Comments: Breath sounds diminished clear throughout.oxygen 3 Liters via nasal canula in place.  Chest:     Chest wall: No tenderness.  Abdominal:     General: Bowel sounds are normal. There is no distension.     Palpations: Abdomen is soft. There is no mass.     Tenderness: There is no abdominal tenderness. There is no right CVA tenderness, left CVA tenderness, guarding or rebound.  Musculoskeletal:        General: No tenderness.     Comments: Unsteady gait self propels on wheelchair.bilateral lower extremities trace edema.   Lymphadenopathy:     Cervical: No cervical adenopathy.  Skin:    General: Skin is warm and dry.     Coloration: Skin is not pale.     Findings: No erythema or rash.  Neurological:     Mental Status: She is alert and oriented to  person, place, and time.     Cranial Nerves: No cranial nerve deficit.     Sensory: No sensory deficit.     Gait: Gait abnormal.  Psychiatric:        Mood  and Affect: Mood normal.        Behavior: Behavior normal.        Thought Content: Thought content normal.        Judgment: Judgment normal.     Labs reviewed: Recent Labs    03/17/18 1753  04/30/18 1333 05/01/18 0545 05/02/18 0402 05/11/18  NA  --    < > 129* 134* 133* 140  K  --    < > 4.0 3.9 4.4 4.2  CL  --    < > 96* 95* 96*  --   CO2  --    < > 29 29 27   --   GLUCOSE  --    < > 156* 140* 131*  --   BUN  --    < > 27* 34* 43* 26*  CREATININE  --    < > 1.21* 1.39* 1.50* 1.2*  CALCIUM  --    < > 8.7* 8.9 8.7*  --   MG 1.7  --   --  2.0 1.9  --    < > = values in this interval not displayed.   Recent Labs    04/30/18 1333 05/01/18 0545 05/02/18 0402 05/11/18  AST 20 19 23  11*  ALT 17 19 24 12   ALKPHOS 67 62 56 56  BILITOT 0.6 0.5 0.6  --   PROT 5.8* 6.3* 5.7*  --   ALBUMIN 2.9* 3.0* 2.9*  --    Recent Labs    03/17/18 1604  04/30/18 1333 05/01/18 0545 05/02/18 0402 05/11/18  WBC 17.1*   < > 5.3 10.2 9.2 12.1  NEUTROABS 12.8*  --   --   --   --   --   HGB 11.3*   < > 10.5* 10.7* 9.9* 10.8*  HCT 34.5*   < > 32.3* 32.7* 30.2* 32*  MCV 93.8   < > 93.9 94.0 93.8  --   PLT 253   < > 358 371 348 282   < > = values in this interval not displayed.   Lab Results  Component Value Date   TSH 10.029 (H) 04/27/2018   Significant Diagnostic Results in last 30 days:  Dg Chest 1 View  Result Date: 04/29/2018 CLINICAL DATA:  Post right-sided thoracentesis. EXAM: CHEST  1 VIEW COMPARISON:  04/28/2018; chest CT-04/29/2018 FINDINGS: Grossly unchanged enlarged cardiac silhouette and mediastinal contours with atherosclerotic plaque within thoracic aorta. There is persistent rightward deviation the tracheal air, the level of the aortic arch. There is persistent thickening the right paratracheal stripe presumably to  prominent vasculature. Interval reduction in persistent small right-sided pleural effusion post thoracentesis. No pneumothorax. Improved aeration the right lung base with persistent right basilar opacities. Unchanged small to moderate size left-sided effusion and associated left basilar heterogeneous/consolidative opacities. Pulmonary vasculature remains indistinct with cephalization of flow. Moderate scoliotic curvature of the thoracolumbar spine, incompletely evaluated. No definite acute osseous abnormalities. IMPRESSION: 1. Interval reduction in persistent small right-sided effusion post thoracentesis. No pneumothorax. 2. Improved aeration the right lung base with persistent right basilar opacities, likely atelectasis. 3. Similar findings of pulmonary edema with small to moderate size left-sided effusion associated left basilar opacities, likely atelectasis. Electronically Signed   By: Sandi Mariscal M.D.   On: 04/29/2018 16:27   Dg Chest 2 View  Result Date: 05/02/2018 CLINICAL DATA:  Follow-up recent pneumonia. EXAM: CHEST - 2 VIEW COMPARISON:  05/01/2018 and 03/17/2018 as well as recent chest CT 04/29/2018 FINDINGS: Lungs are adequately inflated demonstrate  stable patchy density over the right upper lobe known to represent area of scarring with associated suspicious 1.4 cm nodule by recent CT. Stable small to moderate right pleural effusion and stable small left pleural effusion. Likely associated bibasilar atelectasis. Infection in the lung bases is possible. Cardiomediastinal silhouette and remainder of the exam is unchanged. IMPRESSION: Stable patchy density in the right upper lobe known to represent scarring with associated suspicious 1.4 cm nodule by recent chest CT. Stable bilateral pleural effusions likely with associated basilar atelectasis. Infection in the lung bases is possible. Electronically Signed   By: Marin Olp M.D.   On: 05/02/2018 09:07   Dg Chest 2 View  Result Date:  05/01/2018 CLINICAL DATA:  Hypoxia. EXAM: CHEST - 2 VIEW COMPARISON:  04/29/2018 FINDINGS: Improved aeration in the left lower chest is suggestive for decreasing pleural fluid. Cardiac silhouette remains enlarged. Right pleural effusion may slightly enlarged. Again noted are patchy densities in the right mid and upper lung. Severe kyphotic deformity near the thoracolumbar spine with probable compression deformity. Difficult to evaluate for interval change in the spine. IMPRESSION: Slightly increased patchy densities in the right lung are worrisome for pneumonia. Slightly increased right pleural fluid. Improved aeration in the left lower chest may represent decreased left pleural effusion. Electronically Signed   By: Markus Daft M.D.   On: 05/01/2018 08:21   Ct Chest Wo Contrast  Result Date: 04/29/2018 CLINICAL DATA:  Bilateral pleural effusions on chest radiograph EXAM: CT CHEST WITHOUT CONTRAST TECHNIQUE: Multidetector CT imaging of the chest was performed following the standard protocol without IV contrast. COMPARISON:  Chest radiograph dated 04/28/2018 FINDINGS: Cardiovascular: Mild cardiomegaly with left atrial enlargement. No evidence of thoracic aortic aneurysm. Atherosclerotic calcifications of the aortic arch. Three vessel coronary atherosclerosis. Mediastinum/Nodes: Mediastinal lymphadenopathy, including a dominant 2.5 cm short axis node in the low right paratracheal region (series 3/image 60) and 1.6 cm short axis subcarinal node (series 3/image 69). Visualized thyroid is notable for an 8 mm calcified right thyroid nodule. Lungs/Pleura: 13 x 14 mm central right upper lobe nodule (series 4/image 87), suspicious for primary bronchogenic neoplasm. Additional linear/platelike opacities in the right upper lobe, nonspecific. Right lower lobe opacity favors compressive atelectasis (series 3/image 86). Additional atelectasis in the right middle lobe and left lower lobe. Moderate right and small left pleural  effusions. Scattered interstitial opacities with interlobular septal thickening in the bilateral upper lobes, suggesting very mild edema. Mild to moderate centrilobular emphysematous changes, upper lobe predominant. No pneumothorax. Upper Abdomen: Visualized upper abdomen is notable for vascular calcifications. Musculoskeletal: Degenerative changes of the visualized thoracolumbar spine. IMPRESSION: 14 mm right upper lobe nodule, suspicious for primary bronchogenic neoplasm. Associated mediastinal lymphadenopathy, including a dominant 2.5 cm short axis node in the low right paratracheal region. Cardiomegaly with possible mild interstitial edema. Moderate right and small left pleural effusions. Aortic Atherosclerosis (ICD10-I70.0) and Emphysema (ICD10-J43.9). Electronically Signed   By: Julian Hy M.D.   On: 04/29/2018 11:09   Dg Chest Port 1 View  Result Date: 04/28/2018 CLINICAL DATA:  Shortness of breath. EXAM: PORTABLE CHEST 1 VIEW COMPARISON:  04/27/2018 and prior exams FINDINGS: Cardiomegaly, pulmonary vascular congestion, interstitial pulmonary edema, bilateral pleural effusions, RIGHT greater than LEFT, and bilateral LOWER lung atelectasis again noted. No pneumothorax. No significant change from the prior study. IMPRESSION: Unchanged appearance of the chest with pulmonary edema, bilateral pleural effusions, RIGHT greater than LEFT, and bibasilar atelectasis. Electronically Signed   By: Margarette Canada M.D.   On: 04/28/2018 09:54  Dg Chest Portable 1 View  Result Date: 04/27/2018 CLINICAL DATA:  Shortness of breath. EXAM: PORTABLE CHEST 1 VIEW COMPARISON:  Radiograph March 17, 2018. FINDINGS: Stable cardiomegaly. Atherosclerosis of thoracic aorta is noted. No pneumothorax is noted. Interval development of mild to moderate bilateral pleural effusions are noted. Bilateral interstitial densities are noted which may represent edema. Bony thorax is unremarkable. IMPRESSION: Stable cardiomegaly with  probable bilateral pulmonary edema and interval development of mild to moderate bilateral pleural effusions. Electronically Signed   By: Marijo Conception, M.D.   On: 04/27/2018 13:35   US Thoracentesis Asp Pleural Space W/img Guide  Result Date: 04/30/2018 INDICATION: Patient with history of acute on chronic diastolic HF, dyspnea, and right pleural effusion. Request made for diagnostic and therapeutic right thoracentesis. EXAM: ULTRASOUND GUIDED DIAGNOSTIC AND THERAPEUTIC RIGHT THORACENTESIS MEDICATIONS: 8 mL 1% lidocaine COMPLICATIONS: None immediate. PROCEDURE: An ultrasound guided thoracentesis was thoroughly discussed with the patient and questions answered. The benefits, risks, alternatives and complications were also discussed. The patient understands and wishes to proceed with the procedure. Written consent was obtained. Ultrasound was performed to localize and mark an adequate pocket of fluid in the right chest. The area was then prepped and draped in the normal sterile fashion. 1% Lidocaine was used for local anesthesia. Under ultrasound guidance a 6 Fr Safe-T-Centesis catheter was introduced. Thoracentesis was performed. The catheter was removed and a dressing applied. FINDINGS: A total of approximately 1.1 L of hazy amber fluid was removed. Samples were sent to the laboratory as requested by the clinical team. IMPRESSION: Successful ultrasound guided right thoracentesis yielding 1.1 L of pleural fluid. Read by: Earley Abide, PA-C Electronically Signed   By: Sandi Mariscal M.D.   On: 04/29/2018 16:31    Assessment/Plan 1. Chronic obstructive pulmonary disease, unspecified COPD type (HCC) Breathing stable on continuous oxygen 3 liters via nasal cannula.diminished clear  breath sounds through out lung fields.No worsening cough and remains afebrile.continue on Advair 250-50 mcg one puff every 12 hours,Spiriva 18 mcg capsule daily and Duonebs every 6 hrs as needed.     2. Chronic diastolic congestive  heart failure (HCC) No signs of fluid over load.continue on furosemide 40 mg tablet daily,metoprolol 12.5 mg tablet twice daily.continue to monitor weight.   3. CKD (chronic kidney disease) stage 3, GFR 30-59 ml/min (HCC) CR has improved from previous level down to 1.18 from 1.50 and BUN also down to 26 previous 43.continue to monitor.   4. Anemia  Has improved Hgb 10.8( previous 9.9).continue MV.   5. Leukocytosis  WBC 12.1.(05/11/2018).Afebrile.No acute issues.Status post tapered Prednisone.will continue to monitor Temp curve for now.Recheck CBC/diff 05/18/2018.   Family/ staff Communication: Reviewed plan of care with patient and facility Nurse.  Labs/tests ordered: CBC/diff 05/18/2018.    Sandrea Hughs, NP

## 2018-05-17 ENCOUNTER — Other Ambulatory Visit: Payer: Self-pay

## 2018-05-17 ENCOUNTER — Encounter: Payer: Medicare Other | Admitting: Internal Medicine

## 2018-05-17 ENCOUNTER — Encounter: Payer: Self-pay | Admitting: Internal Medicine

## 2018-05-17 ENCOUNTER — Encounter: Payer: Medicare Other | Admitting: Family

## 2018-05-17 DIAGNOSIS — Z8701 Personal history of pneumonia (recurrent): Secondary | ICD-10-CM | POA: Diagnosis not present

## 2018-05-17 DIAGNOSIS — R0902 Hypoxemia: Secondary | ICD-10-CM | POA: Diagnosis not present

## 2018-05-17 DIAGNOSIS — I4891 Unspecified atrial fibrillation: Secondary | ICD-10-CM | POA: Diagnosis present

## 2018-05-17 DIAGNOSIS — M6281 Muscle weakness (generalized): Secondary | ICD-10-CM | POA: Diagnosis not present

## 2018-05-17 DIAGNOSIS — R0603 Acute respiratory distress: Secondary | ICD-10-CM | POA: Diagnosis not present

## 2018-05-17 DIAGNOSIS — J90 Pleural effusion, not elsewhere classified: Secondary | ICD-10-CM | POA: Diagnosis not present

## 2018-05-17 DIAGNOSIS — I5033 Acute on chronic diastolic (congestive) heart failure: Secondary | ICD-10-CM | POA: Diagnosis present

## 2018-05-17 DIAGNOSIS — J441 Chronic obstructive pulmonary disease with (acute) exacerbation: Secondary | ICD-10-CM | POA: Diagnosis not present

## 2018-05-17 DIAGNOSIS — Z87440 Personal history of urinary (tract) infections: Secondary | ICD-10-CM | POA: Diagnosis not present

## 2018-05-17 DIAGNOSIS — R05 Cough: Secondary | ICD-10-CM | POA: Diagnosis not present

## 2018-05-17 DIAGNOSIS — E039 Hypothyroidism, unspecified: Secondary | ICD-10-CM | POA: Diagnosis present

## 2018-05-17 DIAGNOSIS — R0602 Shortness of breath: Secondary | ICD-10-CM | POA: Diagnosis present

## 2018-05-17 DIAGNOSIS — E785 Hyperlipidemia, unspecified: Secondary | ICD-10-CM | POA: Diagnosis present

## 2018-05-17 DIAGNOSIS — R2681 Unsteadiness on feet: Secondary | ICD-10-CM | POA: Diagnosis not present

## 2018-05-17 DIAGNOSIS — I4819 Other persistent atrial fibrillation: Secondary | ICD-10-CM | POA: Diagnosis not present

## 2018-05-17 DIAGNOSIS — J9611 Chronic respiratory failure with hypoxia: Secondary | ICD-10-CM | POA: Diagnosis not present

## 2018-05-17 DIAGNOSIS — I130 Hypertensive heart and chronic kidney disease with heart failure and stage 1 through stage 4 chronic kidney disease, or unspecified chronic kidney disease: Secondary | ICD-10-CM | POA: Diagnosis present

## 2018-05-17 DIAGNOSIS — Z90710 Acquired absence of both cervix and uterus: Secondary | ICD-10-CM | POA: Diagnosis not present

## 2018-05-17 DIAGNOSIS — R000 Tachycardia, unspecified: Secondary | ICD-10-CM | POA: Diagnosis not present

## 2018-05-17 DIAGNOSIS — I493 Ventricular premature depolarization: Secondary | ICD-10-CM | POA: Diagnosis present

## 2018-05-17 DIAGNOSIS — E032 Hypothyroidism due to medicaments and other exogenous substances: Secondary | ICD-10-CM | POA: Diagnosis not present

## 2018-05-17 DIAGNOSIS — R5381 Other malaise: Secondary | ICD-10-CM | POA: Diagnosis not present

## 2018-05-17 DIAGNOSIS — R0689 Other abnormalities of breathing: Secondary | ICD-10-CM | POA: Diagnosis not present

## 2018-05-17 DIAGNOSIS — J302 Other seasonal allergic rhinitis: Secondary | ICD-10-CM | POA: Diagnosis present

## 2018-05-17 DIAGNOSIS — R6889 Other general symptoms and signs: Secondary | ICD-10-CM | POA: Diagnosis not present

## 2018-05-17 DIAGNOSIS — I959 Hypotension, unspecified: Secondary | ICD-10-CM | POA: Diagnosis not present

## 2018-05-17 DIAGNOSIS — J440 Chronic obstructive pulmonary disease with acute lower respiratory infection: Secondary | ICD-10-CM | POA: Diagnosis not present

## 2018-05-17 DIAGNOSIS — J209 Acute bronchitis, unspecified: Secondary | ICD-10-CM | POA: Diagnosis not present

## 2018-05-17 DIAGNOSIS — J9621 Acute and chronic respiratory failure with hypoxia: Secondary | ICD-10-CM | POA: Diagnosis present

## 2018-05-17 DIAGNOSIS — I5032 Chronic diastolic (congestive) heart failure: Secondary | ICD-10-CM | POA: Diagnosis not present

## 2018-05-17 DIAGNOSIS — Z8619 Personal history of other infectious and parasitic diseases: Secondary | ICD-10-CM | POA: Diagnosis not present

## 2018-05-17 DIAGNOSIS — I10 Essential (primary) hypertension: Secondary | ICD-10-CM | POA: Diagnosis not present

## 2018-05-17 DIAGNOSIS — J9622 Acute and chronic respiratory failure with hypercapnia: Secondary | ICD-10-CM | POA: Diagnosis present

## 2018-05-17 DIAGNOSIS — M1990 Unspecified osteoarthritis, unspecified site: Secondary | ICD-10-CM | POA: Diagnosis present

## 2018-05-17 DIAGNOSIS — M810 Age-related osteoporosis without current pathological fracture: Secondary | ICD-10-CM | POA: Diagnosis present

## 2018-05-17 DIAGNOSIS — Z20828 Contact with and (suspected) exposure to other viral communicable diseases: Secondary | ICD-10-CM | POA: Diagnosis present

## 2018-05-17 DIAGNOSIS — Z853 Personal history of malignant neoplasm of breast: Secondary | ICD-10-CM | POA: Diagnosis not present

## 2018-05-17 DIAGNOSIS — Z7401 Bed confinement status: Secondary | ICD-10-CM | POA: Diagnosis not present

## 2018-05-17 DIAGNOSIS — J449 Chronic obstructive pulmonary disease, unspecified: Secondary | ICD-10-CM | POA: Diagnosis present

## 2018-05-17 DIAGNOSIS — K219 Gastro-esophageal reflux disease without esophagitis: Secondary | ICD-10-CM | POA: Diagnosis present

## 2018-05-17 DIAGNOSIS — N183 Chronic kidney disease, stage 3 (moderate): Secondary | ICD-10-CM | POA: Diagnosis present

## 2018-05-17 DIAGNOSIS — Z66 Do not resuscitate: Secondary | ICD-10-CM | POA: Diagnosis present

## 2018-05-17 DIAGNOSIS — J948 Other specified pleural conditions: Secondary | ICD-10-CM | POA: Diagnosis not present

## 2018-05-17 DIAGNOSIS — Z9842 Cataract extraction status, left eye: Secondary | ICD-10-CM | POA: Diagnosis not present

## 2018-05-17 DIAGNOSIS — I509 Heart failure, unspecified: Secondary | ICD-10-CM | POA: Diagnosis not present

## 2018-05-17 DIAGNOSIS — D631 Anemia in chronic kidney disease: Secondary | ICD-10-CM | POA: Diagnosis present

## 2018-05-17 DIAGNOSIS — M2550 Pain in unspecified joint: Secondary | ICD-10-CM | POA: Diagnosis not present

## 2018-05-17 DIAGNOSIS — Z9841 Cataract extraction status, right eye: Secondary | ICD-10-CM | POA: Diagnosis not present

## 2018-05-17 DIAGNOSIS — I480 Paroxysmal atrial fibrillation: Secondary | ICD-10-CM | POA: Diagnosis not present

## 2018-05-18 ENCOUNTER — Non-Acute Institutional Stay (SKILLED_NURSING_FACILITY): Payer: Medicare Other | Admitting: Family

## 2018-05-18 ENCOUNTER — Encounter: Payer: Self-pay | Admitting: Family

## 2018-05-18 DIAGNOSIS — E032 Hypothyroidism due to medicaments and other exogenous substances: Secondary | ICD-10-CM

## 2018-05-18 DIAGNOSIS — I5032 Chronic diastolic (congestive) heart failure: Secondary | ICD-10-CM | POA: Diagnosis not present

## 2018-05-18 DIAGNOSIS — I4819 Other persistent atrial fibrillation: Secondary | ICD-10-CM | POA: Diagnosis not present

## 2018-05-18 DIAGNOSIS — J449 Chronic obstructive pulmonary disease, unspecified: Secondary | ICD-10-CM | POA: Diagnosis not present

## 2018-05-18 NOTE — Progress Notes (Addendum)
Location:  [place] Room Number: [place] of Service:  SNF [phone]) Provider: Yitzchok Carriger FNP-C   [name], MD  Patient Care Team: [name], MD as PCP - General (Internal Medicine) Debara Pickett Nadean Corwin, MD as PCP - Cardiology (Cardiology) Nicholas Lose, MD as Consulting Physician (Hematology and Oncology)  Extended Emergency Contact Information Primary Emergency Contact: Little Ishikawa Mobile Phone: [phone] Relation: Niece Secondary Emergency Contact: Cromer,Chad Mobile Phone: [phone] Relation: Nephew  Code Status:  DNR Goals of care: Advanced Directive information Advanced Directives 05/18/2018  Does Patient Have a Medical Advance Directive? No  Type of Advance Directive -  Does patient want to make changes to medical advance directive? No - Guardian declined  Copy of [place] in Chart? -  Would patient like information on creating a medical advance directive? -  Pre-existing out of facility DNR order (yellow form or pink MOST form) -     Chief Complaint  Patient presents with   Medical Management of Chronic Issues    Routine Visit    HPI:  Pt is a 85 y.o. female seen today [facility] for medical management of chronic diseases.she is seen in her room today.she denies any acute issues during visit.she continues to work well with Therapy.states shortness of breath has improved.she has had no recent fall episodes.Her weight log reviewed weight stable.she states appetite has improved.she continue to require continuous oxygen via nasal cannula. Her WBC were 12.1 (05/11/2018) thought due to tapered prednisone.she had CBC drawn prior to visit for follow leukocytosis results pending.     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   Pneumonia    Seasonal allergies    Stroke ([place]) 2010   no residual   Thyroid disease    Uterine cancer ([place])    Past Surgical History:  Procedure Laterality Date   ABDOMINAL HYSTERECTOMY     BREAST LUMPECTOMY     left breast   CAROTID ENDARTERECTOMY Left June 10, 2008   CE   EYE SURGERY     cataracts removed bilaterally   TONSILLECTOMY      Allergies  Allergen Reactions   Sulfa Antibiotics Shortness Of Breath and Palpitations   Sulfamethoxazole Shortness Of Breath and Palpitations   Amoxicillin Rash   Penicillin G Rash    Did it involve swelling of the face/tongue/throat, SOB, or low BP? Yes Did it involve sudden or severe rash/hives, skin peeling, or any reaction on the inside of your mouth or nose? Unk Did you need to seek medical attention at a hospital or doctor's office? Unk When did it last happen? "it was a long time ago" If all above answers are NO, may proceed with cephalosporin use.    Penicillins Rash    Did it involve swelling of the face/tongue/throat, SOB, or low BP? Yes Did it involve sudden or severe rash/hives, skin peeling, or any reaction on the inside of your mouth or nose? Unk Did you need to seek medical attention at a hospital or doctor's office? Unk When did it last happen? "  it was a long time ago" If all above answers are NO, may proceed with cephalosporin use.    Tape Other (See Comments)    SKIN IS VERY FRAGILE- PLEASE USE AN ALTERNATIVE TO TAPE, AS THE SKIN TEARS EASILY!!    Allergies as of 05/18/2018      Reactions   Sulfa Antibiotics Shortness Of Breath, Palpitations   Sulfamethoxazole Shortness Of Breath, Palpitations   Amoxicillin Rash   Penicillin G Rash   Did it involve swelling of the face/tongue/throat, SOB, or low BP? Yes Did it involve sudden or severe  rash/hives, skin peeling, or any reaction on the inside of your mouth or nose? Unk Did you need to seek medical attention at a hospital or doctor's office? Unk When did it last happen? "it was a long time ago" If all above answers are NO, may proceed with cephalosporin use.   Penicillins Rash   Did it involve swelling of the face/tongue/throat, SOB, or low BP? Yes Did it involve sudden or severe rash/hives, skin peeling, or any reaction on the inside of your mouth or nose? Unk Did you need to seek medical attention at a hospital or doctor's office? Unk When did it last happen? "it was a long time ago" If all above answers are NO, may proceed with cephalosporin use.   Tape Other (See Comments)   SKIN IS VERY FRAGILE- PLEASE USE AN ALTERNATIVE TO TAPE, AS THE SKIN TEARS EASILY!!      Medication List       Accurate as of May 18, 2018  3:37 PM. Always use your most recent med list.        amiodarone 400 MG tablet Commonly known as:  PACERONE Take 1 tablet (400 mg total) by mouth daily for 27 days. (decrease to 200 mg daily on April 14th)   apixaban 2.5 MG Tabs tablet Commonly known as:  ELIQUIS Take 1 tablet (2.5 mg total) by mouth 2 (two) times daily.   Systane ICaps AREDS2 Caps Take 1 capsule by mouth daily.   CENTRUM SILVER PO Take 1 tablet by mouth daily.   diltiazem 120 MG 24 hr capsule Commonly known as:  CARDIZEM CD Take 1 capsule (120 mg total) by mouth daily.   ergocalciferol 1.25 MG (50000 UT) capsule Commonly known as:  VITAMIN D2 Take 1,250 Units by mouth once a week. Once a day on Sunday   fluticasone 50 MCG/ACT nasal spray Commonly known as:  FLONASE Place 2 sprays into the nose daily.   Fluticasone-Salmeterol 250-50 MCG/DOSE Aepb Commonly known as:  ADVAIR Inhale 1 puff into the lungs every 12 (twelve) hours.   furosemide 40 MG tablet Commonly known as:  LASIX Take 1 tablet (40 mg total) by mouth daily for 30 days.   ipratropium-albuterol  0.5-2.5 (3) MG/3ML Soln Commonly known as:  DUONEB Take 3 mLs by nebulization every 6 (six) hours as needed (WHEEZING).   levothyroxine 100 MCG tablet Commonly known as:  SYNTHROID, LEVOTHROID Take 1 tablet (100 mcg total) by mouth daily at 6 (six) AM for 30 days.   metoprolol tartrate 25 MG tablet Commonly known as:  LOPRESSOR Take 12.5 mg by mouth 2 (two) times daily.   OXYGEN Inhale 2 L into the lungs continuous.   pantoprazole 40 MG tablet Commonly known as:  PROTONIX Take 40 mg by mouth daily.   saccharomyces boulardii 250 MG capsule Commonly known as:  FLORASTOR Take 250 mg by mouth 2 (two) times daily.   tiotropium 18  MCG inhalation capsule Commonly known as:  SPIRIVA Place 18 mcg into inhaler and inhale daily.   zinc oxide 20 % ointment Apply 1 application topically as needed for irritation. Apply to buttocks after every incontinent episode and as needed for redness       Review of Systems  Constitutional: Negative for activity change, appetite change, chills, fatigue and unexpected weight change.  HENT: Negative for congestion, postnasal drip, rhinorrhea, sinus pressure, sinus pain, sneezing, sore throat and trouble swallowing.   Eyes: Positive for visual disturbance. Negative for pain, discharge, redness and itching.       Wears eye glasses   Respiratory: Negative for cough, chest tightness and wheezing.        SOB with exertion has improved   Cardiovascular: Positive for leg swelling. Negative for chest pain and palpitations.  Gastrointestinal: Negative for abdominal distention, abdominal pain, constipation, diarrhea, nausea and vomiting.  Endocrine: Negative for cold intolerance, heat intolerance, polydipsia, polyphagia and polyuria.  Genitourinary: Negative for difficulty urinating, dysuria, flank pain, frequency and urgency.  Musculoskeletal: Positive for gait problem.  Skin: Negative for color change, pallor, rash and wound.  Neurological: Negative for  dizziness, weakness, light-headedness and headaches.  Hematological: Does not bruise/bleed easily.  Psychiatric/Behavioral: Negative for agitation, confusion and sleep disturbance. The patient is not nervous/anxious.     Immunization History  Administered Date(s) Administered   Influenza-Unspecified 11/15/2013   PPD Test 07/24/2011   Zoster Recombinat (Shingrix) 12/10/2017   Pertinent  Health Maintenance Due  Topic Date Due   PNA vac Low Risk Adult (1 of 2 - PCV13) 10/02/1984   INFLUENZA VACCINE  09/16/2018   DEXA SCAN  Completed   Fall Risk  01/28/2014  Falls in the past year? Yes  Number falls in past yr: 1  Injury with Fall? No   Functional Status Survey:    Vitals:   05/18/18 1131  BP: 112/64  Pulse: 87  Resp: 18  Temp: (!) 97.5 F (36.4 C)  TempSrc: Oral  SpO2: 90%  Weight: 103 lb 11.2 oz (47 kg)  Height: 4\' 10"  (1.473 m)   Body mass index is 21.67 kg/m. Physical Exam Vitals signs and nursing note reviewed.  Constitutional:      General: She is not in acute distress.    Appearance: She is normal weight. She is not ill-appearing.  HENT:     Head: Normocephalic.     Right Ear: Tympanic membrane, ear canal and external ear normal. There is no impacted cerumen.     Left Ear: Tympanic membrane, ear canal and external ear normal. There is no impacted cerumen.     Nose: Nose normal. No congestion or rhinorrhea.     Mouth/Throat:     Mouth: Mucous membranes are moist.     Pharynx: Oropharynx is clear. No oropharyngeal exudate or posterior oropharyngeal erythema.  Eyes:     General: No scleral icterus.       Right eye: No discharge.        Left eye: No discharge.     Conjunctiva/sclera: Conjunctivae normal.     Pupils: Pupils are equal, round, and reactive to light.     Comments: Corrective lens in place   Neck:     Musculoskeletal: Normal range of motion. No neck rigidity or muscular tenderness.     Vascular: No carotid bruit.  Cardiovascular:      Rate and Rhythm: Normal rate and regular rhythm.     Pulses: Normal pulses.  Heart sounds: Normal heart sounds. No friction rub. No gallop.   Pulmonary:     Effort: Pulmonary effort is normal. No respiratory distress.     Breath sounds: No wheezing, rhonchi or rales.     Comments: Poor air entry oxygen 3 liters via nasal cannula in place.  Chest:     Chest wall: No tenderness.  Abdominal:     General: Bowel sounds are normal. There is no distension.     Palpations: Abdomen is soft. There is no mass.     Tenderness: There is no abdominal tenderness. There is no right CVA tenderness, left CVA tenderness, guarding or rebound.  Musculoskeletal:        General: No tenderness.     Comments: Gait steady with walker.bilateral lower extremities 2+ edema knee high ted hose in place   Lymphadenopathy:     Cervical: No cervical adenopathy.  Skin:    General: Skin is warm and dry.     Coloration: Skin is not pale.     Findings: No erythema or rash.     Comments: Skin intact   Neurological:     Mental Status: She is alert and oriented to person, place, and time.     Cranial Nerves: No cranial nerve deficit.     Sensory: No sensory deficit.     Motor: No weakness.     Coordination: Coordination normal.     Gait: Gait abnormal.  Psychiatric:        Mood and Affect: Mood normal.        Behavior: Behavior normal.        Thought Content: Thought content normal.        Judgment: Judgment normal.     Labs reviewed: Recent Labs    03/17/18 1753  04/30/18 1333 05/01/18 0545 05/02/18 0402 05/11/18  NA  --    < > 129* 134* 133* 140  K  --    < > 4.0 3.9 4.4 4.2  CL  --    < > 96* 95* 96*  --   CO2  --    < > 29 29 27   --   GLUCOSE  --    < > 156* 140* 131*  --   BUN  --    < > 27* 34* 43* 26*  CREATININE  --    < > 1.21* 1.39* 1.50* 1.2*  CALCIUM  --    < > 8.7* 8.9 8.7*  --   MG 1.7  --   --  2.0 1.9  --    < > = values in this interval not displayed.   Recent Labs     04/30/18 1333 05/01/18 0545 05/02/18 0402 05/11/18  AST 20 19 23  11*  ALT 17 19 24 12   ALKPHOS 67 62 56 56  BILITOT 0.6 0.5 0.6  --   PROT 5.8* 6.3* 5.7*  --   ALBUMIN 2.9* 3.0* 2.9*  --    Recent Labs    03/17/18 1604  04/30/18 1333 05/01/18 0545 05/02/18 0402 05/11/18  WBC 17.1*   < > 5.3 10.2 9.2 12.1  NEUTROABS 12.8*  --   --   --   --   --   HGB 11.3*   < > 10.5* 10.7* 9.9* 10.8*  HCT 34.5*   < > 32.3* 32.7* 30.2* 32*  MCV 93.8   < > 93.9 94.0 93.8  --   PLT 253   < > 358 371 348 282   < > =  values in this interval not displayed.   Lab Results  Component Value Date   TSH 10.029 (H) 04/27/2018   Significant Diagnostic Results in last 30 days:  Dg Chest 1 View  Result Date: 04/29/2018 CLINICAL DATA:  Post right-sided thoracentesis. EXAM: CHEST  1 VIEW COMPARISON:  04/28/2018; chest CT-04/29/2018 FINDINGS: Grossly unchanged enlarged cardiac silhouette and mediastinal contours with atherosclerotic plaque within thoracic aorta. There is persistent rightward deviation the tracheal air, the level of the aortic arch. There is persistent thickening the right paratracheal stripe presumably to prominent vasculature. Interval reduction in persistent small right-sided pleural effusion post thoracentesis. No pneumothorax. Improved aeration the right lung base with persistent right basilar opacities. Unchanged small to moderate size left-sided effusion and associated left basilar heterogeneous/consolidative opacities. Pulmonary vasculature remains indistinct with cephalization of flow. Moderate scoliotic curvature of the thoracolumbar spine, incompletely evaluated. No definite acute osseous abnormalities. IMPRESSION: 1. Interval reduction in persistent small right-sided effusion post thoracentesis. No pneumothorax. 2. Improved aeration the right lung base with persistent right basilar opacities, likely atelectasis. 3. Similar findings of pulmonary edema with small to moderate size left-sided  effusion associated left basilar opacities, likely atelectasis. Electronically Signed   By: Sandi Mariscal M.D.   On: 04/29/2018 16:27   Dg Chest 2 View  Result Date: 05/02/2018 CLINICAL DATA:  Follow-up recent pneumonia. EXAM: CHEST - 2 VIEW COMPARISON:  05/01/2018 and 03/17/2018 as well as recent chest CT 04/29/2018 FINDINGS: Lungs are adequately inflated demonstrate stable patchy density over the right upper lobe known to represent area of scarring with associated suspicious 1.4 cm nodule by recent CT. Stable small to moderate right pleural effusion and stable small left pleural effusion. Likely associated bibasilar atelectasis. Infection in the lung bases is possible. Cardiomediastinal silhouette and remainder of the exam is unchanged. IMPRESSION: Stable patchy density in the right upper lobe known to represent scarring with associated suspicious 1.4 cm nodule by recent chest CT. Stable bilateral pleural effusions likely with associated basilar atelectasis. Infection in the lung bases is possible. Electronically Signed   By: Marin Olp M.D.   On: 05/02/2018 09:07   Dg Chest 2 View  Result Date: 05/01/2018 CLINICAL DATA:  Hypoxia. EXAM: CHEST - 2 VIEW COMPARISON:  04/29/2018 FINDINGS: Improved aeration in the left lower chest is suggestive for decreasing pleural fluid. Cardiac silhouette remains enlarged. Right pleural effusion may slightly enlarged. Again noted are patchy densities in the right mid and upper lung. Severe kyphotic deformity near the thoracolumbar spine with probable compression deformity. Difficult to evaluate for interval change in the spine. IMPRESSION: Slightly increased patchy densities in the right lung are worrisome for pneumonia. Slightly increased right pleural fluid. Improved aeration in the left lower chest may represent decreased left pleural effusion. Electronically Signed   By: Markus Daft M.D.   On: 05/01/2018 08:21   Ct Chest Wo Contrast  Result Date: 04/29/2018 CLINICAL  DATA:  Bilateral pleural effusions on chest radiograph EXAM: CT CHEST WITHOUT CONTRAST TECHNIQUE: Multidetector CT imaging of the chest was performed following the standard protocol without IV contrast. COMPARISON:  Chest radiograph dated 04/28/2018 FINDINGS: Cardiovascular: Mild cardiomegaly with left atrial enlargement. No evidence of thoracic aortic aneurysm. Atherosclerotic calcifications of the aortic arch. Three vessel coronary atherosclerosis. Mediastinum/Nodes: Mediastinal lymphadenopathy, including a dominant 2.5 cm short axis node in the low right paratracheal region (series 3/image 60) and 1.6 cm short axis subcarinal node (series 3/image 69). Visualized thyroid is notable for an 8 mm calcified right thyroid nodule. Lungs/Pleura: 13  x 14 mm central right upper lobe nodule (series 4/image 87), suspicious for primary bronchogenic neoplasm. Additional linear/platelike opacities in the right upper lobe, nonspecific. Right lower lobe opacity favors compressive atelectasis (series 3/image 86). Additional atelectasis in the right middle lobe and left lower lobe. Moderate right and small left pleural effusions. Scattered interstitial opacities with interlobular septal thickening in the bilateral upper lobes, suggesting very mild edema. Mild to moderate centrilobular emphysematous changes, upper lobe predominant. No pneumothorax. Upper Abdomen: Visualized upper abdomen is notable for vascular calcifications. Musculoskeletal: Degenerative changes of the visualized thoracolumbar spine. IMPRESSION: 14 mm right upper lobe nodule, suspicious for primary bronchogenic neoplasm. Associated mediastinal lymphadenopathy, including a dominant 2.5 cm short axis node in the low right paratracheal region. Cardiomegaly with possible mild interstitial edema. Moderate right and small left pleural effusions. Aortic Atherosclerosis (ICD10-I70.0) and Emphysema (ICD10-J43.9). Electronically Signed   By: Julian Hy M.D.   On:  04/29/2018 11:09   Dg Chest Port 1 View  Result Date: 04/28/2018 CLINICAL DATA:  Shortness of breath. EXAM: PORTABLE CHEST 1 VIEW COMPARISON:  04/27/2018 and prior exams FINDINGS: Cardiomegaly, pulmonary vascular congestion, interstitial pulmonary edema, bilateral pleural effusions, RIGHT greater than LEFT, and bilateral LOWER lung atelectasis again noted. No pneumothorax. No significant change from the prior study. IMPRESSION: Unchanged appearance of the chest with pulmonary edema, bilateral pleural effusions, RIGHT greater than LEFT, and bibasilar atelectasis. Electronically Signed   By: Margarette Canada M.D.   On: 04/28/2018 09:54   Dg Chest Portable 1 View  Result Date: 04/27/2018 CLINICAL DATA:  Shortness of breath. EXAM: PORTABLE CHEST 1 VIEW COMPARISON:  Radiograph March 17, 2018. FINDINGS: Stable cardiomegaly. Atherosclerosis of thoracic aorta is noted. No pneumothorax is noted. Interval development of mild to moderate bilateral pleural effusions are noted. Bilateral interstitial densities are noted which may represent edema. Bony thorax is unremarkable. IMPRESSION: Stable cardiomegaly with probable bilateral pulmonary edema and interval development of mild to moderate bilateral pleural effusions. Electronically Signed   By: Marijo Conception, M.D.   On: 04/27/2018 13:35   US Thoracentesis Asp Pleural Space W/img Guide  Result Date: 04/30/2018 INDICATION: Patient with history of acute on chronic diastolic HF, dyspnea, and right pleural effusion. Request made for diagnostic and therapeutic right thoracentesis. EXAM: ULTRASOUND GUIDED DIAGNOSTIC AND THERAPEUTIC RIGHT THORACENTESIS MEDICATIONS: 8 mL 1% lidocaine COMPLICATIONS: None immediate. PROCEDURE: An ultrasound guided thoracentesis was thoroughly discussed with the patient and questions answered. The benefits, risks, alternatives and complications were also discussed. The patient understands and wishes to proceed with the procedure. Written consent  was obtained. Ultrasound was performed to localize and mark an adequate pocket of fluid in the right chest. The area was then prepped and draped in the normal sterile fashion. 1% Lidocaine was used for local anesthesia. Under ultrasound guidance a 6 Fr Safe-T-Centesis catheter was introduced. Thoracentesis was performed. The catheter was removed and a dressing applied. FINDINGS: A total of approximately 1.1 L of hazy amber fluid was removed. Samples were sent to the laboratory as requested by the clinical team. IMPRESSION: Successful ultrasound guided right thoracentesis yielding 1.1 L of pleural fluid. Read by: Earley Abide, PA-C Electronically Signed   By: Sandi Mariscal M.D.   On: 04/29/2018 16:31    Assessment/Plan 1. Chronic obstructive pulmonary disease, unspecified COPD type (HCC) Breathing stable on oxygen 3 liters via nasal cannula.continue on Duoneb every 6 hours as needed,Advair 250-50 mcg every 12 hours and Spiriva 18 mcg inhalation capsule.    2. Chronic diastolic congestive  heart failure (HCC) Bilateral lower extremities edema and weight stable.continue on Furosemide 40 mg tablet daily,diltiazem 120 mg capsule daily and metoprolol 12.5 mg tablet twice daily.continue to monitor weight.    3. Hypothyroidism due to medication Lab Results  Component Value Date   TSH 10.029 (H) 04/27/2018   Repeat TSH level 06/26/2018.   4. Other persistent atrial fibrillation HR regular.denies palpitation or chest pain.continue on amiodarone 400 mg tablet daily,apixaban 2.5 mg tablet twice daily, diltiazem 120 mg capsule daily and metoprolol 12.5 mg tablet twice daily.continue to follow up with cardiology as directed.   Family/ staff Communication: Reviewed plan of care with patient and facility Nurse.  Labs/tests ordered: TSH level 06/26/2018.  Addendum: 05/19/2018 Recently WBC result shows leukocytosis now resolved.Has completed tapered prednisone.Patient states more short of breath.Bilateral lung  wheezes oxygen saturation 90% on 3 Liters via nasal cannula.Duonebs administered with some improvement.Nurse reports patient refused weight check today.  - Add Furosemide 40 mg tablet x 1 dose at 2 pm and Furosemide 20 mg tablet daily at 2 pm. - continue on furosemide 40 mg tablet daily in the morning. - Add Potassium chloride 20 meq tablet twice daily. - Recheck BMP 05/22/2018  - Portable Chest X-ray 2 views rule out acute abnormalities.   Sandrea Hughs, NP

## 2018-05-19 ENCOUNTER — Encounter (HOSPITAL_COMMUNITY): Payer: Self-pay

## 2018-05-19 ENCOUNTER — Inpatient Hospital Stay (HOSPITAL_COMMUNITY)
Admission: EM | Admit: 2018-05-19 | Discharge: 2018-05-23 | DRG: 291 | Disposition: A | Discharge status: Skilled Nursing Facility | Payer: Medicare Other | Attending: Internal Medicine | Admitting: Internal Medicine

## 2018-05-19 ENCOUNTER — Emergency Department (HOSPITAL_COMMUNITY): Payer: Medicare Other

## 2018-05-19 ENCOUNTER — Other Ambulatory Visit: Payer: Self-pay

## 2018-05-19 DIAGNOSIS — R0602 Shortness of breath: Secondary | ICD-10-CM | POA: Diagnosis not present

## 2018-05-19 DIAGNOSIS — R0603 Acute respiratory distress: Secondary | ICD-10-CM

## 2018-05-19 DIAGNOSIS — E039 Hypothyroidism, unspecified: Secondary | ICD-10-CM | POA: Diagnosis present

## 2018-05-19 DIAGNOSIS — J302 Other seasonal allergic rhinitis: Secondary | ICD-10-CM | POA: Diagnosis present

## 2018-05-19 DIAGNOSIS — I509 Heart failure, unspecified: Secondary | ICD-10-CM

## 2018-05-19 DIAGNOSIS — J449 Chronic obstructive pulmonary disease, unspecified: Secondary | ICD-10-CM | POA: Diagnosis not present

## 2018-05-19 DIAGNOSIS — R0902 Hypoxemia: Secondary | ICD-10-CM | POA: Diagnosis not present

## 2018-05-19 DIAGNOSIS — Z9842 Cataract extraction status, left eye: Secondary | ICD-10-CM

## 2018-05-19 DIAGNOSIS — I4891 Unspecified atrial fibrillation: Secondary | ICD-10-CM | POA: Diagnosis not present

## 2018-05-19 DIAGNOSIS — N184 Chronic kidney disease, stage 4 (severe): Secondary | ICD-10-CM | POA: Diagnosis present

## 2018-05-19 DIAGNOSIS — J9621 Acute and chronic respiratory failure with hypoxia: Secondary | ICD-10-CM | POA: Diagnosis not present

## 2018-05-19 DIAGNOSIS — Z7951 Long term (current) use of inhaled steroids: Secondary | ICD-10-CM

## 2018-05-19 DIAGNOSIS — Z90710 Acquired absence of both cervix and uterus: Secondary | ICD-10-CM

## 2018-05-19 DIAGNOSIS — Z20828 Contact with and (suspected) exposure to other viral communicable diseases: Secondary | ICD-10-CM | POA: Diagnosis not present

## 2018-05-19 DIAGNOSIS — Z79890 Hormone replacement therapy (postmenopausal): Secondary | ICD-10-CM

## 2018-05-19 DIAGNOSIS — Z66 Do not resuscitate: Secondary | ICD-10-CM | POA: Diagnosis present

## 2018-05-19 DIAGNOSIS — J9622 Acute and chronic respiratory failure with hypercapnia: Secondary | ICD-10-CM | POA: Diagnosis present

## 2018-05-19 DIAGNOSIS — R0689 Other abnormalities of breathing: Secondary | ICD-10-CM | POA: Diagnosis not present

## 2018-05-19 DIAGNOSIS — Z8619 Personal history of other infectious and parasitic diseases: Secondary | ICD-10-CM

## 2018-05-19 DIAGNOSIS — Z8673 Personal history of transient ischemic attack (TIA), and cerebral infarction without residual deficits: Secondary | ICD-10-CM

## 2018-05-19 DIAGNOSIS — Z7901 Long term (current) use of anticoagulants: Secondary | ICD-10-CM

## 2018-05-19 DIAGNOSIS — I130 Hypertensive heart and chronic kidney disease with heart failure and stage 1 through stage 4 chronic kidney disease, or unspecified chronic kidney disease: Principal | ICD-10-CM | POA: Diagnosis present

## 2018-05-19 DIAGNOSIS — D631 Anemia in chronic kidney disease: Secondary | ICD-10-CM | POA: Diagnosis present

## 2018-05-19 DIAGNOSIS — Z8542 Personal history of malignant neoplasm of other parts of uterus: Secondary | ICD-10-CM

## 2018-05-19 DIAGNOSIS — Z853 Personal history of malignant neoplasm of breast: Secondary | ICD-10-CM

## 2018-05-19 DIAGNOSIS — Z87891 Personal history of nicotine dependence: Secondary | ICD-10-CM

## 2018-05-19 DIAGNOSIS — N183 Chronic kidney disease, stage 3 (moderate): Secondary | ICD-10-CM | POA: Diagnosis present

## 2018-05-19 DIAGNOSIS — I493 Ventricular premature depolarization: Secondary | ICD-10-CM | POA: Diagnosis present

## 2018-05-19 DIAGNOSIS — Z825 Family history of asthma and other chronic lower respiratory diseases: Secondary | ICD-10-CM

## 2018-05-19 DIAGNOSIS — Z9109 Other allergy status, other than to drugs and biological substances: Secondary | ICD-10-CM

## 2018-05-19 DIAGNOSIS — Z8249 Family history of ischemic heart disease and other diseases of the circulatory system: Secondary | ICD-10-CM

## 2018-05-19 DIAGNOSIS — R000 Tachycardia, unspecified: Secondary | ICD-10-CM | POA: Diagnosis not present

## 2018-05-19 DIAGNOSIS — Z880 Allergy status to penicillin: Secondary | ICD-10-CM

## 2018-05-19 DIAGNOSIS — I4819 Other persistent atrial fibrillation: Secondary | ICD-10-CM

## 2018-05-19 DIAGNOSIS — Z9841 Cataract extraction status, right eye: Secondary | ICD-10-CM

## 2018-05-19 DIAGNOSIS — Z8701 Personal history of pneumonia (recurrent): Secondary | ICD-10-CM

## 2018-05-19 DIAGNOSIS — J9601 Acute respiratory failure with hypoxia: Secondary | ICD-10-CM | POA: Diagnosis present

## 2018-05-19 DIAGNOSIS — J90 Pleural effusion, not elsewhere classified: Secondary | ICD-10-CM | POA: Diagnosis not present

## 2018-05-19 DIAGNOSIS — E785 Hyperlipidemia, unspecified: Secondary | ICD-10-CM | POA: Diagnosis present

## 2018-05-19 DIAGNOSIS — M1990 Unspecified osteoarthritis, unspecified site: Secondary | ICD-10-CM | POA: Diagnosis present

## 2018-05-19 DIAGNOSIS — E032 Hypothyroidism due to medicaments and other exogenous substances: Secondary | ICD-10-CM

## 2018-05-19 DIAGNOSIS — Z882 Allergy status to sulfonamides status: Secondary | ICD-10-CM

## 2018-05-19 DIAGNOSIS — Z87440 Personal history of urinary (tract) infections: Secondary | ICD-10-CM

## 2018-05-19 DIAGNOSIS — I5033 Acute on chronic diastolic (congestive) heart failure: Secondary | ICD-10-CM | POA: Diagnosis present

## 2018-05-19 DIAGNOSIS — M810 Age-related osteoporosis without current pathological fracture: Secondary | ICD-10-CM | POA: Diagnosis present

## 2018-05-19 DIAGNOSIS — K219 Gastro-esophageal reflux disease without esophagitis: Secondary | ICD-10-CM | POA: Diagnosis present

## 2018-05-19 DIAGNOSIS — J9602 Acute respiratory failure with hypercapnia: Secondary | ICD-10-CM

## 2018-05-19 DIAGNOSIS — J948 Other specified pleural conditions: Secondary | ICD-10-CM | POA: Diagnosis not present

## 2018-05-19 DIAGNOSIS — Z79899 Other long term (current) drug therapy: Secondary | ICD-10-CM

## 2018-05-19 LAB — CBC WITH DIFFERENTIAL/PLATELET
Abs Immature Granulocytes: 0.03 10*3/uL (ref 0.00–0.07)
Basophils Absolute: 0.1 10*3/uL (ref 0.0–0.1)
Basophils Relative: 1 %
Eosinophils Absolute: 0 10*3/uL (ref 0.0–0.5)
Eosinophils Relative: 0 %
HCT: 35.8 % — ABNORMAL LOW (ref 36.0–46.0)
Hemoglobin: 11.2 g/dL — ABNORMAL LOW (ref 12.0–15.0)
Immature Granulocytes: 0 %
Lymphocytes Relative: 10 %
Lymphs Abs: 1 10*3/uL (ref 0.7–4.0)
MCH: 29.6 pg (ref 26.0–34.0)
MCHC: 31.3 g/dL (ref 30.0–36.0)
MCV: 94.7 fL (ref 80.0–100.0)
Monocytes Absolute: 1 10*3/uL (ref 0.1–1.0)
Monocytes Relative: 10 %
Neutro Abs: 7.9 10*3/uL — ABNORMAL HIGH (ref 1.7–7.7)
Neutrophils Relative %: 79 %
Platelets: 248 10*3/uL (ref 150–400)
RBC: 3.78 MIL/uL — ABNORMAL LOW (ref 3.87–5.11)
RDW: 14.6 % (ref 11.5–15.5)
WBC: 10 10*3/uL (ref 4.0–10.5)
nRBC: 0 % (ref 0.0–0.2)

## 2018-05-19 LAB — GRAM STAIN

## 2018-05-19 LAB — COMPREHENSIVE METABOLIC PANEL
ALT: 19 U/L (ref 0–44)
AST: 19 U/L (ref 15–41)
Albumin: 3.1 g/dL — ABNORMAL LOW (ref 3.5–5.0)
Alkaline Phosphatase: 79 U/L (ref 38–126)
Anion gap: 13 (ref 5–15)
BUN: 18 mg/dL (ref 8–23)
CO2: 26 mmol/L (ref 22–32)
Calcium: 8.8 mg/dL — ABNORMAL LOW (ref 8.9–10.3)
Chloride: 95 mmol/L — ABNORMAL LOW (ref 98–111)
Creatinine, Ser: 1.1 mg/dL — ABNORMAL HIGH (ref 0.44–1.00)
GFR calc Af Amer: 48 mL/min — ABNORMAL LOW (ref >60)
GFR calc non Af Amer: 42 mL/min — ABNORMAL LOW (ref >60)
Glucose, Bld: 117 mg/dL — ABNORMAL HIGH (ref 70–99)
Potassium: 3.6 mmol/L (ref 3.5–5.1)
Sodium: 134 mmol/L — ABNORMAL LOW (ref 135–145)
Total Bilirubin: 0.7 mg/dL (ref 0.3–1.2)
Total Protein: 6.3 g/dL — ABNORMAL LOW (ref 6.5–8.1)

## 2018-05-19 LAB — BRAIN NATRIURETIC PEPTIDE: B Natriuretic Peptide: 315.7 pg/mL — ABNORMAL HIGH (ref 0.0–100.0)

## 2018-05-19 LAB — INFLUENZA PANEL BY PCR (TYPE A & B)
Influenza A By PCR: NEGATIVE
Influenza B By PCR: NEGATIVE

## 2018-05-19 LAB — TROPONIN I: Troponin I: <0.03 ng/mL (ref <0.03)

## 2018-05-19 MED ORDER — MOMETASONE FURO-FORMOTEROL FUM 200-5 MCG/ACT IN AERO
2.0000 | INHALATION_SPRAY | Freq: Two times a day (BID) | RESPIRATORY_TRACT | Status: DC
  Administered 2018-05-20 – 2018-05-23 (×6): 2 via RESPIRATORY_TRACT
  Filled 2018-05-19: qty 8.8

## 2018-05-19 MED ORDER — AMIODARONE HCL 200 MG PO TABS
400.0000 mg | ORAL_TABLET | Freq: Every day | ORAL | Status: DC
  Administered 2018-05-20 – 2018-05-23 (×4): 400 mg via ORAL
  Filled 2018-05-19 (×4): qty 2

## 2018-05-19 MED ORDER — SODIUM CHLORIDE 0.9% FLUSH: 3.0000 mL | INTRAVENOUS | Status: DC | PRN

## 2018-05-19 MED ORDER — TIOTROPIUM BROMIDE MONOHYDRATE 18 MCG IN CAPS: 18.0000 ug | ORAL_CAPSULE | Freq: Every day | RESPIRATORY_TRACT | Status: DC

## 2018-05-19 MED ORDER — FUROSEMIDE 10 MG/ML IJ SOLN
40.0000 mg | Freq: Two times a day (BID) | INTRAMUSCULAR | Status: DC
  Administered 2018-05-20 – 2018-05-23 (×7): 40 mg via INTRAVENOUS
  Filled 2018-05-19 (×7): qty 4

## 2018-05-19 MED ORDER — ALBUTEROL SULFATE HFA 108 (90 BASE) MCG/ACT IN AERS
2.0000 | INHALATION_SPRAY | RESPIRATORY_TRACT | Status: DC | PRN
  Filled 2018-05-19: qty 6.7

## 2018-05-19 MED ORDER — SODIUM CHLORIDE 0.9% FLUSH
3.0000 mL | Freq: Two times a day (BID) | INTRAVENOUS | Status: DC
  Administered 2018-05-19 – 2018-05-23 (×8): 3 mL via INTRAVENOUS

## 2018-05-19 MED ORDER — IPRATROPIUM BROMIDE HFA 17 MCG/ACT IN AERS
4.0000 | INHALATION_SPRAY | Freq: Once | RESPIRATORY_TRACT | Status: DC
  Filled 2018-05-19: qty 12.9

## 2018-05-19 MED ORDER — ACETAMINOPHEN 325 MG PO TABS: 650.0000 mg | ORAL_TABLET | ORAL | Status: DC | PRN

## 2018-05-19 MED ORDER — VANCOMYCIN HCL IN DEXTROSE 1-5 GM/200ML-% IV SOLN
1000.0000 mg | Freq: Once | INTRAVENOUS | Status: AC
  Administered 2018-05-19: 19:00:00 1000 mg via INTRAVENOUS
  Filled 2018-05-19: qty 200

## 2018-05-19 MED ORDER — APIXABAN 2.5 MG PO TABS
2.5000 mg | ORAL_TABLET | Freq: Two times a day (BID) | ORAL | Status: DC
  Administered 2018-05-19 – 2018-05-23 (×8): 2.5 mg via ORAL
  Filled 2018-05-19 (×8): qty 1

## 2018-05-19 MED ORDER — SODIUM CHLORIDE 0.9 % IV SOLN
1.0000 g | Freq: Once | INTRAVENOUS | Status: AC
  Administered 2018-05-19: 1 g via INTRAVENOUS
  Filled 2018-05-19: qty 1

## 2018-05-19 MED ORDER — SODIUM CHLORIDE 0.9 % IV SOLN: 250.0000 mL | INTRAVENOUS | Status: DC | PRN

## 2018-05-19 MED ORDER — PREDNISONE 20 MG PO TABS
60.0000 mg | ORAL_TABLET | Freq: Once | ORAL | Status: AC
  Administered 2018-05-19: 19:00:00 60 mg via ORAL
  Filled 2018-05-19: qty 3

## 2018-05-19 MED ORDER — UMECLIDINIUM BROMIDE 62.5 MCG/INH IN AEPB
1.0000 | INHALATION_SPRAY | Freq: Every day | RESPIRATORY_TRACT | Status: DC
  Administered 2018-05-20 – 2018-05-23 (×4): 1 via RESPIRATORY_TRACT
  Filled 2018-05-19: qty 7

## 2018-05-19 MED ORDER — LIDOCAINE-EPINEPHRINE (PF) 2 %-1:200000 IJ SOLN
INTRAMUSCULAR | Status: AC
  Administered 2018-05-19: 21:00:00
  Filled 2018-05-19: qty 20

## 2018-05-19 MED ORDER — LEVOTHYROXINE SODIUM 100 MCG PO TABS
100.0000 ug | ORAL_TABLET | Freq: Every day | ORAL | Status: DC
  Administered 2018-05-20 – 2018-05-23 (×4): 100 ug via ORAL
  Filled 2018-05-19 (×4): qty 1

## 2018-05-19 MED ORDER — FUROSEMIDE 10 MG/ML IJ SOLN
40.0000 mg | Freq: Once | INTRAMUSCULAR | Status: AC
  Administered 2018-05-19: 40 mg via INTRAVENOUS
  Filled 2018-05-19: qty 4

## 2018-05-19 MED ORDER — LIDOCAINE HCL (PF) 1 % IJ SOLN
INTRAMUSCULAR | Status: AC
  Filled 2018-05-19: qty 30

## 2018-05-19 MED ORDER — ONDANSETRON HCL 4 MG/2ML IJ SOLN: 4.0000 mg | Freq: Four times a day (QID) | INTRAMUSCULAR | Status: DC | PRN

## 2018-05-19 MED ORDER — ALBUTEROL SULFATE HFA 108 (90 BASE) MCG/ACT IN AERS
6.0000 | INHALATION_SPRAY | RESPIRATORY_TRACT | Status: DC
  Administered 2018-05-19: 19:00:00 6 via RESPIRATORY_TRACT
  Filled 2018-05-19: qty 6.7

## 2018-05-19 NOTE — H&P (Signed)
History and Physical    Katherine [name] EXB:[id number] DOB: 1918/01/23 DOA: 05/19/2018  PCP: [name], MD   Patient coming from: Nursing home   Chief Complaint: SOB  HPI: Katherine [name] is a 85 y.o. female with medical history significant for chronic diastolic CHF, atrial fibrillation on Eliquis, COPD, hypothyroidism, chronic kidney disease stage III, and chronic 3 L/min supplemental oxygen requirement, now presenting to the emergency department for evaluation of shortness of breath.  Patient was admitted to the hospital last month and discharged on 05/02/2018 after diuresis, treatment of COPD exacerbation, and right-sided thoracentesis.  She had been stable at her nursing facility on 3 L/min of supplemental oxygen since time of discharge until she recently experienced rapidly progressive worsening in her dyspnea.  She has been coughing, but has not noted any fevers or chills.  She also denies chest pain.  She reports bilateral lower extremity swelling that has been going on for some time.  EMS was called out to her nursing facility today, she was found to be saturating in the low 60s, and was transported to the hospital on nonrebreather.  ED Course: Upon arrival to the ED, patient is found to be afebrile, saturating 91% on 15 L/min of supplemental oxygen, breathing 36 times per minute, tachycardic in the low 100s, and with stable blood pressure.  EKG features atrial fibrillation with rate 103, PVC, and QTc interval 507 ms.  Chest x-ray is notable for stable cardiomegaly with bilateral interstitial and alveolar airspace opacities and pleural effusions.  Chemistry panel also notable for a slight hyponatremia and a creatinine of 1.10, consistent with her apparent baseline.  CBC features a mild normocytic anemia, troponin is undetectable, and BNP is elevated to 316.  Right-sided thoracentesis was performed in the emergency department with close to 900 cc of fluid removed.  She was also treated with  albuterol, prednisone, Atrovent, cefepime, vancomycin, and 40 mg IV Lasix in the ED.  She reports some improvement after the thoracentesis but continues to require more than 10 L/min of supplemental oxygen to maintain a saturation of 90% while at rest.  Review of Systems:  All other systems reviewed and apart from HPI, are negative.  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 Her smoking use included cigarettes. She has a 40.00 pack-year smoking history. She has never used smokeless tobacco. She reports current alcohol use. She reports that she does not use drugs.  Allergies  Allergen Reactions   Sulfa Antibiotics Shortness Of Breath and Palpitations   Sulfamethoxazole Shortness Of Breath and  Palpitations   Amoxicillin Rash   Penicillin G Rash    Did it involve swelling of the face/tongue/throat, SOB, or low BP? Yes Did it involve sudden or severe rash/hives, skin peeling, or any reaction on the inside of your mouth or nose? Unk Did you need to seek medical attention at a hospital or doctor's office? Unk When did it last happen? "it was a long time ago" If all above answers are NO, may proceed with cephalosporin use.    Penicillins  Rash    Did it involve swelling of the face/tongue/throat, SOB, or low BP? Yes Did it involve sudden or severe rash/hives, skin peeling, or any reaction on the inside of your mouth or nose? Unk Did you need to seek medical attention at a hospital or doctor's office? Unk When did it last happen? "it was a long time ago" If all above answers are NO, may proceed with cephalosporin use.    Tape Other (See Comments)    SKIN IS VERY FRAGILE- PLEASE USE AN ALTERNATIVE TO TAPE, AS THE SKIN TEARS EASILY!!    Family History  Problem Relation Age of Onset   Heart failure Mother    Heart disease Mother    Heart failure Father    Heart disease Father    Heart disease Brother    Asthma Paternal Grandmother      Prior to Admission medications   Medication Sig Start Date End Date Taking? Authorizing Provider  amiodarone (PACERONE) 400 MG tablet Take 1 tablet (400 mg total) by mouth daily for 27 days. (decrease to 200 mg daily on April 14th) 05/03/18 05/30/18  Elodia Florence., MD  apixaban (ELIQUIS) 2.5 MG TABS tablet Take 1 tablet (2.5 mg total) by mouth 2 (two) times daily. 03/21/18   Elgergawy, Silver Huguenin, MD  diltiazem (CARDIZEM CD) 120 MG 24 hr capsule Take 1 capsule (120 mg total) by mouth daily. 03/22/18   Elgergawy, Silver Huguenin, MD  ergocalciferol (VITAMIN D2) 1.25 MG (50000 UT) capsule Take 1,250 Units by mouth once a week. Once a day on Sunday    [provider]  fluticasone (FLONASE) 50 MCG/ACT nasal spray Place 2 sprays into the nose daily.    [provider]  Fluticasone-Salmeterol (ADVAIR) 250-50 MCG/DOSE AEPB Inhale 1 puff into the lungs every 12 (twelve) hours.    [provider]  furosemide (LASIX) 40 MG tablet Take 1 tablet (40 mg total) by mouth daily for 30 days. 05/03/18 06/02/18  Elodia Florence., MD  ipratropium-albuterol (DUONEB) 0.5-2.5 (3) MG/3ML SOLN Take 3 mLs by nebulization every 6 (six) hours as needed (WHEEZING). 03/21/18   Elgergawy,  Silver Huguenin, MD  levothyroxine (SYNTHROID, LEVOTHROID) 100 MCG tablet Take 1 tablet (100 mcg total) by mouth daily at 6 (six) AM for 30 days. 05/03/18 06/02/18  Elodia Florence., MD  metoprolol tartrate (LOPRESSOR) 25 MG tablet Take 12.5 mg by mouth 2 (two) times daily.    [provider]  Multiple Vitamins-Minerals (CENTRUM SILVER PO) Take 1 tablet by mouth daily.    [provider]  Multiple Vitamins-Minerals (SYSTANE ICAPS AREDS2) CAPS Take 1 capsule by mouth daily.     [provider]  OXYGEN Inhale 2 L into the lungs continuous.    [provider]  pantoprazole (PROTONIX) 40 MG tablet Take 40 mg by mouth daily.  12/10/17   [provider]  saccharomyces boulardii (FLORASTOR) 250 MG capsule Take 250 mg by mouth 2 (two) times daily.  [provider]  tiotropium (SPIRIVA) 18 MCG inhalation capsule Place 18 mcg into inhaler and inhale daily.    [provider]  zinc oxide 20 % ointment Apply 1 application topically as needed for irritation. Apply to buttocks after every incontinent episode and as needed for redness    [provider]    Physical Exam: Vitals:   05/19/18 2045 05/19/18 2100 05/19/18 2115 05/19/18 2145  BP: (!) 119/56 (!) 122/59 (!) 118/59 120/60  Pulse: (!) 108 (!) 108 74 99  Resp: (!) 31 (!) 21 19 (!) 31  Temp:      TempSrc:      SpO2: 94% 95% 92% 96%    Constitutional: NAD, calm  Eyes: PERTLA, lids and conjunctivae normal ENMT: Mucous membranes are moist. Posterior pharynx clear of any exudate or lesions.   Neck: normal, supple, no masses, no thyromegaly Respiratory: Tachypnea, mild dyspnea with speech. Coughing noted. No pallor or cyanosis. Symmetric chest wall expansion.  Cardiovascular: Rate ~100 and irregular. Edema to distal LE's bilterally. Abdomen: No distension, no tenderness, soft. Bowel sounds normal.  Musculoskeletal: no clubbing / cyanosis. No joint deformity upper and lower  extremities.    Skin: no significant rashes, lesions, ulcers. Warm, dry, well-perfused. Neurologic: No facial asymmetry. Sensation intact. Moving all extremities.  Psychiatric: Alert and oriented to person, place, and situation. Pleasant. Cooperative.    Labs on Admission: I have personally reviewed following labs and imaging studies  CBC: Recent Labs  Lab 05/19/18 2000  WBC 10.0  NEUTROABS 7.9*  HGB 11.2*  HCT 35.8*  MCV 94.7  PLT 809   Basic Metabolic Panel: Recent Labs  Lab 05/19/18 2000  NA 134*  K 3.6  CL 95*  CO2 26  GLUCOSE 117*  BUN 18  CREATININE 1.10*  CALCIUM 8.8*   GFR: Estimated Creatinine Clearance: 18.4 mL/min (A) (by C-G formula based on SCr of 1.1 mg/dL (H)). Liver Function Tests: Recent Labs  Lab 05/19/18 2000  AST 19  ALT 19  ALKPHOS 79  BILITOT 0.7  PROT 6.3*  ALBUMIN 3.1*   No results for input(s): LIPASE, AMYLASE in the last 168 hours. No results for input(s): AMMONIA in the last 168 hours. Coagulation Profile: No results for input(s): INR, PROTIME in the last 168 hours. Cardiac Enzymes: Recent Labs  Lab 05/19/18 2000  TROPONINI <0.03   BNP (last 3 results) No results for input(s): PROBNP in the last 8760 hours. HbA1C: No results for input(s): HGBA1C in the last 72 hours. CBG: No results for input(s): GLUCAP in the last 168 hours. Lipid Profile: No results for input(s): CHOL, HDL, LDLCALC, TRIG, CHOLHDL, LDLDIRECT in the last 72 hours. Thyroid Function Tests: No results for input(s): TSH, T4TOTAL, FREET4, T3FREE, THYROIDAB in the last 72 hours. Anemia Panel: No results for input(s): VITAMINB12, FOLATE, FERRITIN, TIBC, IRON, RETICCTPCT in the last 72 hours. Urine analysis:    Component Value Date/Time   COLORURINE YELLOW 03/16/2012 [place] 03/16/2012 1126   LABSPEC 1.020 03/16/2012 1126   PHURINE 7.5 03/16/2012 1126   GLUCOSEU NEGATIVE 03/16/2012 1126   [place] 03/16/2012 1126   BILIRUBINUR  NEGATIVE 03/16/2012 1126   KETONESUR TRACE (A) 03/16/2012 1126   PROTEINUR 30 (A) 03/16/2012 1126   UROBILINOGEN 1.0 03/16/2012 1126   NITRITE NEGATIVE 03/16/2012 1126   LEUKOCYTESUR NEGATIVE 03/16/2012 1126   Sepsis Labs: @LABRCNTIP (procalcitonin:4,lacticidven:4) )No results found for this or any previous visit (from the past 240 hour(s)).   Radiological Exams on Admission:  Dg Chest Portable 1 View  Result Date: 05/19/2018 CLINICAL DATA:  Evaluate for post thoracentesis. EXAM: PORTABLE CHEST 1 VIEW COMPARISON:  05/19/2018 FINDINGS: Lungs are adequately inflated demonstrate moderate interval improvement in the right-sided pleural effusion as there is a small amount of residual right pleural fluid likely with associated basilar atelectasis. Moderate left pleural effusion slightly worse. Likely associated left basilar atelectasis. No pneumothorax. Prominence of the perihilar markings suggesting mild interstitial edema and less likely infection. Mild stable cardiomegaly. Remainder of the exam is unchanged. IMPRESSION: Moderate improvement and right-sided pleural effusion with small residual right effusion likely with associated basilar atelectasis. No pneumothorax. Slight worsening moderate size left pleural effusion likely with associated basilar atelectasis. Mild cardiomegaly with suggestion of interstitial edema and less likely infection. Electronically Signed   By: Marin Olp M.D.   On: 05/19/2018 21:15   Dg Chest Portable 1 View  Result Date: 05/19/2018 CLINICAL DATA:  Shortness of breath EXAM: PORTABLE CHEST 1 VIEW COMPARISON:  05/02/2018 FINDINGS: Diffuse bilateral interstitial and patchy alveolar airspace opacities. Small bilateral pleural effusions right greater than left. No pneumothorax. Stable cardiomegaly. No acute osseous abnormality. IMPRESSION: Stable cardiomegaly with bilateral interstitial and alveolar airspace opacities with pleural effusions. Differential considerations include  pulmonary edema versus multilobar pneumonia. Electronically Signed   By: Kathreen Devoid   On: 05/19/2018 19:10    EKG: Independently reviewed. Atrial fibrillation, rate 103, PVC, QTc 507 ms.   Assessment/Plan  1. Acute on chronic hypoxic respiratory failure  - Presents from nursing home with SOB, requiring 15 Lpm of supplemental O2 in order to keep sat in low 90's  - She denies fevers or travel, but has been coughing, coming from nursing home, and was recently hospitalized (flu pcr neg during admission)  - She was recently discharged with 3 Lpm, now requiring 15 Lpm  - Most likely secondary to CHF, though in light of the current pandemic, it is difficult to exclude COVID in this patient with cough and potential exposures at nursing facility and hospital  - Treat CHF as below, send swab for COVID testing, continue infection-prevention measures    2. Acute on chronic diastolic CHF  - Presents with worsening SOB, found to be saturating 60's by EMS  - There is peripheral edema, elevated BNP, pleural effusions, and interstitial and alveolar opacity on CXR that most likely reflects edema  - She was given 40 mg IV Lasix and had right thoracentesis in ED  - Recent echo with preserved EF  - Continue diuresis with Lasix 40 mg IV q12h, follow daily wt and I/O's    3. Pleural effusions  - Right thoracentesis performed last month and again in ED 05/19/18 with ~900 cc off; she also has a moderate left-sided effusion noted on CXR   - There was concern for possible malignancy on recent imaging; pleural fluid cytology negative last month  - Most likely secondary to CHF  - Continue diuresis, consider left-sided thoracentesis if she fails to improve with diuresis    4. COPD  - No wheezing on admission  - Continue ICS/LABA, Spiriva, and as-needed albuterol MDI    5. Atrial fibrillation  - In atrial fibrillation in ED with rates 70's-110's  - Cardiology considering DCCV later this month  - CHADS-VASc is at  least 56 (age x2, gender, CHF)  - Continue Eliquis, continue amiodarone (recently started, 400 mg qD until 05/30/18, then 200 mg qD)     6. Hypothyroidism  - Synthroid recently increased to 100 mcg  daily, will continue    7. CKD stage III  - SCr is 1.1 on admission, consistent with her apparent baseline  - Renally-dose medications    PPE: Gloves, gown, CAPR worn in patient's room. Patient is wearing mask.  DVT prophylaxis: Eliquis  Code Status: DNR  Family Communication: Discussed with patient  Consults called: None Admission status: Observation     Vianne Bulls, MD [facility] Pager [phone]  If 7PM-7AM, please contact night-coverage [email or web address] Password [facility]  05/19/2018, 9:53 PM

## 2018-05-19 NOTE — ED Notes (Signed)
Purewick put in place

## 2018-05-19 NOTE — ED Notes (Signed)
  Attempted to ween O2 and patient dropped SPO2 to 86%.

## 2018-05-19 NOTE — ED Provider Notes (Signed)
[facility] EMERGENCY DEPARTMENT Provider Note   CSN: [id number] Arrival date & time: 05/19/18  1746    History   Chief Complaint No chief complaint on file.   HPI Katherine [name] is a 85 y.o. female.      Shortness of Breath  Severity:  Mild Onset quality:  Gradual Duration:  3 days Timing:  Constant Progression:  Worsening Chronicity:  New Relieved by:  None tried Worsened by:  Nothing Ineffective treatments:  None tried Associated symptoms: no abdominal pain, no chest pain, no cough, no fever and no sore throat   Risk factors: hx of cancer (likely, but undiagnosed)   Risk factors: no recent alcohol use and no hx of PE/DVT     Past Medical History:  Diagnosis Date  . Arthritis   . Constipation   . COPD (chronic obstructive pulmonary disease) ([place])   . Fatigue   . Frequent UTI   . GERD (gastroesophageal reflux disease)   . History of shingles 2007  . Hyperlipemia   . Hypertension   . Hypothyroidism   . Multiple allergies   . Osteoporosis   . Paget's carcinoma of the nipple ([place]) 03/16/2013  . Pneumonia   . Seasonal allergies   . Stroke [facility]) 2010   no residual  . Thyroid disease   . Uterine cancer [facility])     Patient Active Problem List   Diagnosis Date Noted  . Pleural effusion due to CHF (congestive heart failure) ([place]) 05/19/2018  . Pressure injury of skin 04/28/2018  . Acute on chronic diastolic (congestive) heart failure ([place]) 04/27/2018  . Diastolic CHF ([place]) 67/61/9509  . Fatigue 04/25/2018  . Edema 04/03/2018  . Acute on chronic respiratory failure with hypoxia ([place]) 03/17/2018  . Atrial fibrillation ([place]) 03/17/2018  . Volume overload 03/17/2018  . Chronic anemia 03/17/2018  . CKD (chronic kidney disease) stage 3, GFR 30-59 ml/min (H[facility]) 03/17/2018  . Breast cancer ([place]) 03/16/2013  . Paget's disease and intraductal carcinoma of left breast ([place]) 03/16/2013  . Aftercare following surgery of the circulatory system, [place]  01/03/2013  . COPD (chronic obstructive pulmonary disease) with acute bronchitis ([place]) 03/16/2012  . Altered mental status 03/16/2012  . Occlusion and stenosis of carotid artery without mention of cerebral infarction 01/06/2012  . Bronchiectasis without acute exacerbation ([place]) 08/20/2011  . Atypical mycobacterial disease 08/20/2011  . Hypothyroidism 07/24/2011  . Hyperlipidemia 07/24/2011  . Hypertension 07/24/2011  . CAP (community acquired pneumonia) 07/23/2011  . COPD (chronic obstructive pulmonary disease) ([place]) 07/23/2011  . Hemoptysis 07/23/2011    Past Surgical History:  Procedure Laterality Date  . ABDOMINAL HYSTERECTOMY    . BREAST LUMPECTOMY     left breast  . CAROTID ENDARTERECTOMY Left June 10, 2008   CE  . EYE SURGERY     cataracts removed bilaterally  . TONSILLECTOMY       OB History   No obstetric history on file.      Home Medications    Prior to Admission medications   Medication Sig Start Date End Date Taking? Authorizing Provider  amiodarone (PACERONE) 400 MG tablet Take 1 tablet (400 mg total) by mouth daily for 27 days. (decrease to 200 mg daily on April 14th) 05/03/18 05/30/18 Yes Elodia Florence., MD  apixaban (ELIQUIS) 2.5 MG TABS tablet Take 1 tablet (2.5 mg total) by mouth 2 (two) times daily. 03/21/18  Yes Elgergawy, Silver Huguenin, MD  diltiazem (CARDIZEM CD) 120 MG 24 hr capsule Take 1 capsule (  120 mg total) by mouth daily. 03/22/18  Yes Elgergawy, Silver Huguenin, MD  fluticasone (FLONASE) 50 MCG/ACT nasal spray Place 2 sprays into both nostrils daily.    Yes [provider]  Fluticasone-Salmeterol (ADVAIR) 250-50 MCG/DOSE AEPB Inhale 1 puff into the lungs every 12 (twelve) hours.   Yes [provider]  furosemide (LASIX) 40 MG tablet Take 1 tablet (40 mg total) by mouth daily for 30 days. 05/03/18 06/02/18 Yes Elodia Florence., MD  ipratropium-albuterol (DUONEB) 0.5-2.5 (3) MG/3ML SOLN Take 3 mLs by nebulization every 6 (six) hours  as needed (WHEEZING). 03/21/18  Yes Elgergawy, Silver Huguenin, MD  levothyroxine (SYNTHROID, LEVOTHROID) 100 MCG tablet Take 1 tablet (100 mcg total) by mouth daily at 6 (six) AM for 30 days. 05/03/18 06/02/18 Yes Elodia Florence., MD  metoprolol tartrate (LOPRESSOR) 25 MG tablet Take 12.5 mg by mouth 2 (two) times daily.   Yes [provider]  Multiple Vitamins-Minerals (CENTRUM SILVER 50+WOMEN) TABS Take 1 tablet by mouth daily with breakfast.   Yes [provider]  Multiple Vitamins-Minerals (ICAPS AREDS 2 PO) Take 1 tablet by mouth daily.   Yes [provider]  Nutritional Supplements (RESOURCE SUPPORT) LIQD Take 90 mLs by mouth 3 (three) times daily.   Yes [provider]  OXYGEN Inhale 2 L into the lungs continuous. SAT MUST BE >90%   Yes [provider]  pantoprazole (PROTONIX) 40 MG tablet Take 40 mg by mouth daily.  12/10/17  Yes [provider]  potassium chloride (KLOR-CON) 20 MEQ packet Take 20 mEq by mouth 2 (two) times daily.   Yes [provider]  saccharomyces boulardii (FLORASTOR) 250 MG capsule Take 250 mg by mouth 2 (two) times daily.   Yes [provider]  tiotropium (SPIRIVA) 18 MCG inhalation capsule Place 18 mcg into inhaler and inhale daily.   Yes [provider]  zinc oxide 20 % ointment Apply 1 application topically as needed for irritation. Apply to buttocks after every incontinent episode and as needed for redness   Yes [provider]  ergocalciferol (VITAMIN D2) 1.25 MG (50000 UT) capsule Take 1,250 Units by mouth once a week. Once a day on Sunday    [provider]    Family History Family History  Problem Relation Age of Onset  . Heart failure Mother   . Heart disease Mother   . Heart failure Father   . Heart disease Father   . Heart disease Brother   . Asthma Paternal Grandmother     Social History Social History   Tobacco Use  . Smoking status: Former Smoker     Packs/day: 1.00    Years: 40.00    Pack years: 40.00    Types: Cigarettes    Last attempt to quit: 02/15/1981    Years since quitting: 37.2  . Smokeless tobacco: Never Used  Substance Use Topics  . Alcohol use: Yes  . Drug use: No     Allergies   Sulfa antibiotics; Sulfamethoxazole; Sulfur; Amoxicillin; Penicillin g; Penicillins; and Tape   Review of Systems Review of Systems  Constitutional: Negative for fever.  HENT: Negative for sore throat.   Respiratory: Positive for shortness of breath. Negative for cough.   Cardiovascular: Negative for chest pain.  Gastrointestinal: Negative for abdominal pain.     Physical Exam Updated Vital Signs BP (!) 124/59   Pulse 100   Temp (!) 97.2 F (36.2 C) (Oral)   Resp 15  SpO2 95%   Physical Exam Vitals signs and nursing note reviewed.  Constitutional:      Appearance: She is well-developed.  HENT:     Head: Normocephalic and atraumatic.  Neck:     Musculoskeletal: Normal range of motion.  Cardiovascular:     Rate and Rhythm: Normal rate and regular rhythm.  Pulmonary:     Effort: Tachypnea, accessory muscle usage, respiratory distress and retractions present.     Breath sounds: Decreased air movement present. No stridor. Decreased breath sounds present.  Abdominal:     General: There is no distension.  Neurological:     Mental Status: She is alert.      ED Treatments / Results  Labs (all labs ordered are listed, but only abnormal results are displayed) Labs Reviewed  CBC WITH DIFFERENTIAL/PLATELET - Abnormal; Notable for the following components:      Result Value   RBC 3.78 (*)    Hemoglobin 11.2 (*)    HCT 35.8 (*)    Neutro Abs 7.9 (*)    All other components within normal limits  COMPREHENSIVE METABOLIC PANEL - Abnormal; Notable for the following components:   Sodium 134 (*)    Chloride 95 (*)    Glucose, Bld 117 (*)    Creatinine, Ser 1.10 (*)    Calcium 8.8 (*)    Total Protein 6.3 (*)    Albumin  3.1 (*)    GFR calc non Af Amer 42 (*)    GFR calc Af Amer 48 (*)    All other components within normal limits  BRAIN NATRIURETIC PEPTIDE - Abnormal; Notable for the following components:   B Natriuretic Peptide 315.7 (*)    All other components within normal limits  BODY FLUID CELL COUNT WITH DIFFERENTIAL - Abnormal; Notable for the following components:   Color, Fluid ORANGE (*)    Appearance, Fluid HAZY (*)    Monocyte-Macrophage-Serous Fluid 16 (*)    All other components within normal limits  GRAM STAIN  CULTURE, BLOOD (ROUTINE X 2)  CULTURE, BLOOD (ROUTINE X 2)  CULTURE, BODY FLUID-BOTTLE  EXPECTORATED SPUTUM ASSESSMENT W REFEX TO RESP CULTURE  GRAM STAIN  NOVEL CORONAVIRUS, NAA (HOSPITAL ORDER, SEND-OUT TO REF LAB)  TROPONIN I  INFLUENZA PANEL BY PCR (TYPE A & B)  GLUCOSE, PLEURAL OR PERITONEAL FLUID  BASIC METABOLIC PANEL  STREP PNEUMONIAE URINARY ANTIGEN  CYTOLOGY - NON PAP    EKG EKG Interpretation  Date/Time:  Friday May 19 2018 18:02:56 EDT Ventricular Rate:  103 PR Interval:    QRS Duration: 92 QT Interval:  387 QTC Calculation: 507 R Axis:   90 Text Interpretation:  Atrial fibrillation Ventricular premature complex Borderline right axis deviation Prolonged QT interval faster rate.  No acute changes Confirmed by Merrily Pew [phone]) on 05/19/2018 6:31:14 PM   Radiology Dg Chest Portable 1 View  Result Date: 05/19/2018 CLINICAL DATA:  Evaluate for post thoracentesis. EXAM: PORTABLE CHEST 1 VIEW COMPARISON:  05/19/2018 FINDINGS: Lungs are adequately inflated demonstrate moderate interval improvement in the right-sided pleural effusion as there is a small amount of residual right pleural fluid likely with associated basilar atelectasis. Moderate left pleural effusion slightly worse. Likely associated left basilar atelectasis. No pneumothorax. Prominence of the perihilar markings suggesting mild interstitial edema and less likely infection. Mild stable  cardiomegaly. Remainder of the exam is unchanged. IMPRESSION: Moderate improvement and right-sided pleural effusion with small residual right effusion likely with associated basilar atelectasis. No pneumothorax. Slight worsening moderate size left  pleural effusion likely with associated basilar atelectasis. Mild cardiomegaly with suggestion of interstitial edema and less likely infection. Electronically Signed   By: Marin Olp M.D.   On: 05/19/2018 21:15   Dg Chest Portable 1 View  Result Date: 05/19/2018 CLINICAL DATA:  Shortness of breath EXAM: PORTABLE CHEST 1 VIEW COMPARISON:  05/02/2018 FINDINGS: Diffuse bilateral interstitial and patchy alveolar airspace opacities. Small bilateral pleural effusions right greater than left. No pneumothorax. Stable cardiomegaly. No acute osseous abnormality. IMPRESSION: Stable cardiomegaly with bilateral interstitial and alveolar airspace opacities with pleural effusions. Differential considerations include pulmonary edema versus multilobar pneumonia. Electronically Signed   By: Kathreen Devoid   On: 05/19/2018 19:10    Procedures .Critical Care Performed by: Merrily Pew, MD Authorized by: Merrily Pew, MD   Critical care provider statement:    Critical care time (minutes):  45   Critical care was time spent personally by me on the following activities:  Discussions with consultants, evaluation of patient's response to treatment, examination of patient, ordering and performing treatments and interventions, ordering and review of laboratory studies, ordering and review of radiographic studies, pulse oximetry, re-evaluation of patient's condition, obtaining history from patient or surrogate and review of old charts   I assumed direction of critical care for this patient from another provider in my specialty: no   THORACENTESIS BEDSIDE Date/Time: 05/19/2018 10:21 PM Performed by: Merrily Pew, MD Authorized by: Merrily Pew, MD   Consent:    Consent  obtained:  Verbal and emergent situation   Consent given by:  Patient   Risks discussed:  Bleeding, infection, nerve damage, pneumothorax, pain and incomplete drainage   Alternatives discussed:  Alternative treatment and observation Anesthesia (see MAR for exact dosages):    Anesthesia method:  Local infiltration   Local anesthetic:  Lidocaine 1% WITH epi Procedure details:    Preparation: Patient was prepped and draped in usual sterile fashion     Patient position:  Sitting   Location:  R midscapular line   Intercostal space:  5th   Puncture method:  Over-the-needle catheter   Ultrasound guidance: yes     Indwelling catheter placed: no     Number of attempts:  1   Drainage characteristics:  Bloody and cloudy Post-procedure details:    Chest x-ray performed: yes     Chest x-ray findings:  Pleural effusion improved   Patient tolerance of procedure:  Tolerated well, no immediate complications   (including critical care time)  Medications Ordered in ED Medications  ipratropium (ATROVENT HFA) inhaler 4 puff (has no administration in time range)  apixaban (ELIQUIS) tablet 2.5 mg (has no administration in time range)  mometasone-formoterol (DULERA) 200-5 MCG/ACT inhaler 2 puff (2 puffs Inhalation Not Given 05/19/18 2229)  tiotropium (SPIRIVA) inhalation capsule ([facility] use ONLY) 18 mcg (18 mcg Inhalation Not Given 05/19/18 2230)  sodium chloride flush (NS) 0.9 % injection 3 mL (has no administration in time range)  sodium chloride flush (NS) 0.9 % injection 3 mL (has no administration in time range)  0.9 %  sodium chloride infusion (has no administration in time range)  acetaminophen (TYLENOL) tablet 650 mg (has no administration in time range)  ondansetron (ZOFRAN) injection 4 mg (has no administration in time range)  furosemide (LASIX) injection 40 mg (has no administration in time range)  albuterol (PROVENTIL HFA;VENTOLIN HFA) 108 (90 Base) MCG/ACT inhaler 2 puff (has no administration in  time range)  levothyroxine (SYNTHROID, LEVOTHROID) tablet 100 mcg (has no administration in time range)  amiodarone (PACERONE) tablet 400 mg (has no administration in time range)  predniSONE (DELTASONE) tablet 60 mg (60 mg Oral Given 05/19/18 1909)  vancomycin (VANCOCIN) IVPB 1000 mg/200 mL premix (0 mg Intravenous Stopped 05/19/18 2013)  ceFEPIme (MAXIPIME) 1 g in sodium chloride 0.9 % 100 mL IVPB (0 g Intravenous Stopped 05/19/18 2004)  furosemide (LASIX) injection 40 mg (40 mg Intravenous Given 05/19/18 2039)  lidocaine-EPINEPHrine (XYLOCAINE W/EPI) 2 %-1:200000 (PF) injection (  Given 05/19/18 2041)     Initial Impression / Assessment and Plan / ED Course  I have reviewed the triage vital signs and the nursing notes.  Pertinent labs & imaging results that were available during my care of the patient were reviewed by me and considered in my medical decision making (see chart for details).  Katherine [name] was evaluated in Emergency Department on 05/19/2018 for the symptoms described in the history of present illness. She was evaluated in the context of the global COVID-19 pandemic, which necessitated consideration that the patient might be at risk for infection with the SARS-CoV-2 virus that causes COVID-19. Institutional protocols and algorithms that pertain to the evaluation of patients at risk for COVID-19 are in a state of rapid change based on information released by regulatory bodies including the CDC and federal and state organizations. These policies and algorithms were followed during the patient's care in the ED.   DNR/DNI. Initially considered infectious cause for her symptoms however after the x-ray showing this large right pleural effusion felt that was more likely.  Drained approximately 900 cc of bloody cloudy fluid with improvement in her respiratory rate but still hypoxic.  Her BNP is little bit elevated but not as high as it was before so started on some Lasix.  Give some breathing  treatments, steroids and antibiotics as she has a history of COPD and was pretty diminished on arrival.  Her lungs did seem to sound better with that.  Discussed with hospitalist, will admit.   Final Clinical Impressions(s) / ED Diagnoses   Final diagnoses:  Acute on chronic respiratory failure with hypoxia [facility])  Respiratory distress    ED Discharge Orders    None       Herley Bernardini, Corene Cornea, MD 05/19/18 2307

## 2018-05-19 NOTE — ED Notes (Signed)
Niece and POA, Little Ishikawa, would like an update as soon as possible at [phone]

## 2018-05-19 NOTE — ED Notes (Signed)
ED TO INPATIENT HANDOFF REPORT  ED Nurse Name and Phone #:  Lenice Pressman [phone]  S Name/Age/Gender Katherine [name] 85 y.o. female Room/Bed: 020C/020C  Code Status   Code Status: DNR  Home/SNF/Other Skilled nursing facility Patient oriented to: self, place, time and situation Is this baseline? Yes   Triage Complete: Triage complete  Chief Complaint Increasing SOB  Triage Note EMS called to [facility] for pt who had SOB and her O2 was 68% on RA (per EMS), EMS had\ her on 12L O2 via non-rebreather while in route to ED.    Allergies Allergies  Allergen Reactions  . Sulfa Antibiotics Shortness Of Breath and Palpitations  . Sulfamethoxazole Shortness Of Breath and Palpitations  . Amoxicillin Rash  . Penicillin G Rash    Did it involve swelling of the face/tongue/throat, SOB, or low BP? Yes Did it involve sudden or severe rash/hives, skin peeling, or any reaction on the inside of your mouth or nose? Unk Did you need to seek medical attention at a hospital or doctor's office? Unk When did it last happen? "it was a long time ago" If all above answers are "NO", may proceed with cephalosporin use.   Marland Kitchen Penicillins Rash    Did it involve swelling of the face/tongue/throat, SOB, or low BP? Yes Did it involve sudden or severe rash/hives, skin peeling, or any reaction on the inside of your mouth or nose? Unk Did you need to seek medical attention at a hospital or doctor's office? Unk When did it last happen? "it was a long time ago" If all above answers are "NO", may proceed with cephalosporin use.   . Tape Other (See Comments)    SKIN IS VERY FRAGILE- PLEASE USE AN ALTERNATIVE TO TAPE, AS THE SKIN TEARS EASILY!!    Level of Care/Admitting Diagnosis ED Disposition    ED Disposition Condition Comment   Admit  Hospital Area: [facility] [[id number]]  Level of Care: Progressive [102]  I expect the patient will be discharged within 24 hours: No (not a candidate for  5C-Observation unit)  Diagnosis: Acute on chronic respiratory failure with hypoxia [facility]) [[id number]]  Admitting Physician: Vianne Bulls [[id number]]  Attending Physician: Vianne Bulls [[id number]]  Bed request comments: high-risk COVID r/o  PT Class (Do Not Modify): Observation [104]  PT Acc Code (Do Not Modify): Observation [10022]       B Medical/Surgery History Past Medical History:  Diagnosis Date  . Arthritis   . Constipation   . COPD (chronic obstructive pulmonary disease) ([place])   . Fatigue   . Frequent UTI   . GERD (gastroesophageal reflux disease)   . History of shingles 2007  . Hyperlipemia   . Hypertension   . Hypothyroidism   . Multiple allergies   . Osteoporosis   . Paget's carcinoma of the nipple ([place]) 03/16/2013  . Pneumonia   . Seasonal allergies   . Stroke [facility]) 2010   no residual  . Thyroid disease   . Uterine cancer [facility])    Past Surgical History:  Procedure Laterality Date  . ABDOMINAL HYSTERECTOMY    . BREAST LUMPECTOMY     left breast  . CAROTID ENDARTERECTOMY Left June 10, 2008   CE  . EYE SURGERY     cataracts removed bilaterally  . TONSILLECTOMY       A IV Location/Drains/Wounds Patient Lines/Drains/Airways Status   Active Line/Drains/Airways    Name:   Placement date:   Placement time:  Site:   Days:   Peripheral IV 05/19/18 Left Forearm   05/19/18    1910    Forearm   less than 1   Peripheral IV 05/19/18 Right Antecubital   05/19/18    1934    Antecubital   less than 1   Pressure Injury 04/27/18 Stage I -  Intact skin with non-blanchable redness of a localized area usually over a bony prominence. red area over pt's spine   04/27/18    1517     22          Intake/Output Last 24 hours No intake or output data in the 24 hours ending 05/19/18 2150  Labs/Imaging Results for orders placed or performed during the hospital encounter of 05/19/18 (from the past 48 hour(s))  CBC with Differential     Status: Abnormal   Collection Time:  05/19/18  8:00 PM  Result Value Ref Range   WBC 10.0 4.0 - 10.5 K/uL   RBC 3.78 (L) 3.87 - 5.11 MIL/uL   Hemoglobin 11.2 (L) 12.0 - 15.0 g/dL   HCT 35.8 (L) 36.0 - 46.0 %   MCV 94.7 80.0 - 100.0 fL   MCH 29.6 26.0 - 34.0 pg   MCHC 31.3 30.0 - 36.0 g/dL   RDW 14.6 11.5 - 15.5 %   Platelets 248 150 - 400 K/uL   nRBC 0.0 0.0 - 0.2 %   Neutrophils Relative % 79 %   Neutro Abs 7.9 (H) 1.7 - 7.7 K/uL   Lymphocytes Relative 10 %   Lymphs Abs 1.0 0.7 - 4.0 K/uL   Monocytes Relative 10 %   Monocytes Absolute 1.0 0.1 - 1.0 K/uL   Eosinophils Relative 0 %   Eosinophils Absolute 0.0 0.0 - 0.5 K/uL   Basophils Relative 1 %   Basophils Absolute 0.1 0.0 - 0.1 K/uL   Immature Granulocytes 0 %   Abs Immature Granulocytes 0.03 0.00 - 0.07 K/uL    Comment: Performed at [facility], [address], [place], [place] [address]  Comprehensive metabolic panel     Status: Abnormal   Collection Time: 05/19/18  8:00 PM  Result Value Ref Range   Sodium 134 (L) 135 - 145 mmol/L   Potassium 3.6 3.5 - 5.1 mmol/L   Chloride 95 (L) 98 - 111 mmol/L   CO2 26 22 - 32 mmol/L   Glucose, Bld 117 (H) 70 - 99 mg/dL   BUN 18 8 - 23 mg/dL   Creatinine, Ser 1.10 (H) 0.44 - 1.00 mg/dL   Calcium 8.8 (L) 8.9 - 10.3 mg/dL   Total Protein 6.3 (L) 6.5 - 8.1 g/dL   Albumin 3.1 (L) 3.5 - 5.0 g/dL   AST 19 15 - 41 U/L   ALT 19 0 - 44 U/L   Alkaline Phosphatase 79 38 - 126 U/L   Total Bilirubin 0.7 0.3 - 1.2 mg/dL   GFR calc non Af Amer 42 (L) >60 mL/min   GFR calc Af Amer 48 (L) >60 mL/min   Anion gap 13 5 - 15    Comment: Performed at [facility], [place] [address], [place], [place] [address]  Troponin I - ONCE - STAT     Status: None   Collection Time: 05/19/18  8:00 PM  Result Value Ref Range   Troponin I <0.03 <0.03 ng/mL    Comment: Performed at [facility], [place] [address] [place], [place] [address]  Brain natriuretic peptide  Status: Abnormal   Collection Time: 05/19/18  8:00 PM  Result  Value Ref Range   B Natriuretic Peptide 315.7 (H) 0.0 - 100.0 pg/mL    Comment: Performed at [facility] [address], [place], [place] [address]  Glucose, pleural or peritoneal fluid     Status: None   Collection Time: 05/19/18  8:45 PM  Result Value Ref Range   Glucose, Fluid 129 mg/dL    Comment: (NOTE) No normal range established for this test Results should be evaluated in conjunction with serum values    Fluid Type-FGLU PERITONEAL     Comment: FLUID Performed at [facility] [address], [place], [place] [address]    Dg Chest Portable 1 View  Result Date: 05/19/2018 CLINICAL DATA:  Evaluate for post thoracentesis. EXAM: PORTABLE CHEST 1 VIEW COMPARISON:  05/19/2018 FINDINGS: Lungs are adequately inflated demonstrate moderate interval improvement in the right-sided pleural effusion as there is a small amount of residual right pleural fluid likely with associated basilar atelectasis. Moderate left pleural effusion slightly worse. Likely associated left basilar atelectasis. No pneumothorax. Prominence of the perihilar markings suggesting mild interstitial edema and less likely infection. Mild stable cardiomegaly. Remainder of the exam is unchanged. IMPRESSION: Moderate improvement and right-sided pleural effusion with small residual right effusion likely with associated basilar atelectasis. No pneumothorax. Slight worsening moderate size left pleural effusion likely with associated basilar atelectasis. Mild cardiomegaly with suggestion of interstitial edema and less likely infection. Electronically Signed   By: Marin Olp M.D.   On: 05/19/2018 21:15   Dg Chest Portable 1 View  Result Date: 05/19/2018 CLINICAL DATA:  Shortness of breath EXAM: PORTABLE CHEST 1 VIEW COMPARISON:  05/02/2018 FINDINGS: Diffuse bilateral interstitial and patchy alveolar airspace opacities. Small bilateral pleural effusions right greater than left. No pneumothorax. Stable cardiomegaly. No acute  osseous abnormality. IMPRESSION: Stable cardiomegaly with bilateral interstitial and alveolar airspace opacities with pleural effusions. Differential considerations include pulmonary edema versus multilobar pneumonia. Electronically Signed   By: Kathreen Devoid   On: 05/19/2018 19:10    Pending Labs Unresulted Labs (From admission, onward)    Start     Ordered   05/19/18 2045  Body fluid cell count with differential  Once,   R     05/19/18 2045   05/19/18 2045  Culture, body fluid-bottle  Once,   R     05/19/18 2045   05/19/18 1931  Gram stain  ([facility] ED BODY FLUID PANEL)  ONCE - STAT,   STAT    Comments:  Stat order placed in ED    05/19/18 1932   05/19/18 1822  Blood culture (routine x 2)  BLOOD CULTURE X 2,   STAT     05/19/18 1821   05/19/18 1822  Influenza panel by PCR (type A & B)  ONCE - STAT,   STAT     05/19/18 1821          Vitals/Pain Today's Vitals   05/19/18 2045 05/19/18 2100 05/19/18 2106 05/19/18 2115  BP: (!) 119/56 (!) 122/59  (!) 118/59  Pulse: (!) 108 (!) 108  74  Resp: (!) 31 (!) 21  19  Temp:      TempSrc:      SpO2: 94% 95%  92%  PainSc:   0-No pain     Isolation Precautions No active isolations  Medications Medications  albuterol (PROVENTIL HFA;VENTOLIN HFA) 108 (90 Base) MCG/ACT inhaler 6 puff (6 puffs Inhalation Given 05/19/18 1911)  ipratropium (ATROVENT HFA) inhaler 4 puff (has no administration in time range)  predniSONE (DELTASONE) tablet 60 mg (60 mg Oral Given 05/19/18 1909)  vancomycin (VANCOCIN) IVPB 1000 mg/200 mL premix (0 mg Intravenous Stopped 05/19/18 2013)  ceFEPIme (MAXIPIME) 1 g in sodium chloride 0.9 % 100 mL IVPB (0 g Intravenous Stopped 05/19/18 2004)  furosemide (LASIX) injection 40 mg (40 mg Intravenous Given 05/19/18 2039)  lidocaine-EPINEPHrine (XYLOCAINE W/EPI) 2 %-1:200000 (PF) injection (  Given 05/19/18 2041)    Mobility walks with device     Focused Assessments Pulmonary Assessment Handoff:  Lung sounds: Bilateral Breath  Sounds: Diminished O2 Device: NRB O2 Flow Rate (L/min): 15 L/min      R Recommendations: See Admitting Provider Note  Report given to:   Additional Notes:

## 2018-05-19 NOTE — ED Triage Notes (Signed)
EMS called to [facility] for pt who had SOB and her O2 was 68% on RA (per EMS), EMS had\ her on 12L O2 via non-rebreather while in route to ED.

## 2018-05-19 NOTE — ED Notes (Signed)
Please call pts niece POA Little Ishikawa [phone] to give an update

## 2018-05-19 NOTE — ED Notes (Signed)
Niece and POA, Katherine [name], would like an update as soon as possible at [phone]

## 2018-05-20 DIAGNOSIS — Z66 Do not resuscitate: Secondary | ICD-10-CM | POA: Diagnosis present

## 2018-05-20 DIAGNOSIS — M1990 Unspecified osteoarthritis, unspecified site: Secondary | ICD-10-CM | POA: Diagnosis present

## 2018-05-20 DIAGNOSIS — N183 Chronic kidney disease, stage 3 (moderate): Secondary | ICD-10-CM | POA: Diagnosis not present

## 2018-05-20 DIAGNOSIS — Z90710 Acquired absence of both cervix and uterus: Secondary | ICD-10-CM | POA: Diagnosis not present

## 2018-05-20 DIAGNOSIS — J9601 Acute respiratory failure with hypoxia: Secondary | ICD-10-CM | POA: Diagnosis present

## 2018-05-20 DIAGNOSIS — I4891 Unspecified atrial fibrillation: Secondary | ICD-10-CM | POA: Diagnosis present

## 2018-05-20 DIAGNOSIS — R5381 Other malaise: Secondary | ICD-10-CM | POA: Diagnosis not present

## 2018-05-20 DIAGNOSIS — E039 Hypothyroidism, unspecified: Secondary | ICD-10-CM | POA: Diagnosis present

## 2018-05-20 DIAGNOSIS — Z20828 Contact with and (suspected) exposure to other viral communicable diseases: Secondary | ICD-10-CM | POA: Diagnosis present

## 2018-05-20 DIAGNOSIS — D631 Anemia in chronic kidney disease: Secondary | ICD-10-CM | POA: Diagnosis present

## 2018-05-20 DIAGNOSIS — Z87440 Personal history of urinary (tract) infections: Secondary | ICD-10-CM | POA: Diagnosis not present

## 2018-05-20 DIAGNOSIS — I130 Hypertensive heart and chronic kidney disease with heart failure and stage 1 through stage 4 chronic kidney disease, or unspecified chronic kidney disease: Secondary | ICD-10-CM | POA: Diagnosis present

## 2018-05-20 DIAGNOSIS — I493 Ventricular premature depolarization: Secondary | ICD-10-CM | POA: Diagnosis present

## 2018-05-20 DIAGNOSIS — M2550 Pain in unspecified joint: Secondary | ICD-10-CM | POA: Diagnosis not present

## 2018-05-20 DIAGNOSIS — Z7401 Bed confinement status: Secondary | ICD-10-CM | POA: Diagnosis not present

## 2018-05-20 DIAGNOSIS — K219 Gastro-esophageal reflux disease without esophagitis: Secondary | ICD-10-CM | POA: Diagnosis present

## 2018-05-20 DIAGNOSIS — J302 Other seasonal allergic rhinitis: Secondary | ICD-10-CM | POA: Diagnosis present

## 2018-05-20 DIAGNOSIS — J9602 Acute respiratory failure with hypercapnia: Secondary | ICD-10-CM

## 2018-05-20 DIAGNOSIS — R0602 Shortness of breath: Secondary | ICD-10-CM | POA: Diagnosis not present

## 2018-05-20 DIAGNOSIS — E785 Hyperlipidemia, unspecified: Secondary | ICD-10-CM | POA: Diagnosis present

## 2018-05-20 DIAGNOSIS — Z9841 Cataract extraction status, right eye: Secondary | ICD-10-CM | POA: Diagnosis not present

## 2018-05-20 DIAGNOSIS — I5033 Acute on chronic diastolic (congestive) heart failure: Secondary | ICD-10-CM | POA: Diagnosis not present

## 2018-05-20 DIAGNOSIS — J9621 Acute and chronic respiratory failure with hypoxia: Secondary | ICD-10-CM | POA: Diagnosis not present

## 2018-05-20 DIAGNOSIS — I4819 Other persistent atrial fibrillation: Secondary | ICD-10-CM | POA: Diagnosis not present

## 2018-05-20 DIAGNOSIS — I509 Heart failure, unspecified: Secondary | ICD-10-CM | POA: Diagnosis not present

## 2018-05-20 DIAGNOSIS — Z9842 Cataract extraction status, left eye: Secondary | ICD-10-CM | POA: Diagnosis not present

## 2018-05-20 DIAGNOSIS — E032 Hypothyroidism due to medicaments and other exogenous substances: Secondary | ICD-10-CM | POA: Diagnosis not present

## 2018-05-20 DIAGNOSIS — I959 Hypotension, unspecified: Secondary | ICD-10-CM | POA: Diagnosis not present

## 2018-05-20 DIAGNOSIS — R0902 Hypoxemia: Secondary | ICD-10-CM | POA: Diagnosis not present

## 2018-05-20 DIAGNOSIS — Z853 Personal history of malignant neoplasm of breast: Secondary | ICD-10-CM | POA: Diagnosis not present

## 2018-05-20 DIAGNOSIS — J449 Chronic obstructive pulmonary disease, unspecified: Secondary | ICD-10-CM | POA: Diagnosis not present

## 2018-05-20 DIAGNOSIS — J9622 Acute and chronic respiratory failure with hypercapnia: Secondary | ICD-10-CM | POA: Diagnosis present

## 2018-05-20 DIAGNOSIS — M810 Age-related osteoporosis without current pathological fracture: Secondary | ICD-10-CM | POA: Diagnosis present

## 2018-05-20 DIAGNOSIS — Z8619 Personal history of other infectious and parasitic diseases: Secondary | ICD-10-CM | POA: Diagnosis not present

## 2018-05-20 DIAGNOSIS — Z8701 Personal history of pneumonia (recurrent): Secondary | ICD-10-CM | POA: Diagnosis not present

## 2018-05-20 LAB — BASIC METABOLIC PANEL
Anion gap: 14 (ref 5–15)
BUN: 17 mg/dL (ref 8–23)
CO2: 26 mmol/L (ref 22–32)
Calcium: 8.5 mg/dL — ABNORMAL LOW (ref 8.9–10.3)
Chloride: 94 mmol/L — ABNORMAL LOW (ref 98–111)
Creatinine, Ser: 1.11 mg/dL — ABNORMAL HIGH (ref 0.44–1.00)
GFR calc Af Amer: 48 mL/min — ABNORMAL LOW (ref >60)
GFR calc non Af Amer: 41 mL/min — ABNORMAL LOW (ref >60)
Glucose, Bld: 157 mg/dL — ABNORMAL HIGH (ref 70–99)
Potassium: 3.5 mmol/L (ref 3.5–5.1)
Sodium: 134 mmol/L — ABNORMAL LOW (ref 135–145)

## 2018-05-20 LAB — STREP PNEUMONIAE URINARY ANTIGEN: Strep Pneumo Urinary Antigen: NEGATIVE

## 2018-05-20 LAB — RESPIRATORY PANEL BY PCR

## 2018-05-20 LAB — MRSA PCR SCREENING: MRSA by PCR: NEGATIVE

## 2018-05-20 NOTE — Progress Notes (Addendum)
PROGRESS NOTE    Katherine [name]  TKP:[id number] DOB: 21-May-1917 DOA: 05/19/2018 PCP: [name], MD   Brief Narrative:  Per admitting provider: BYRDIE [name] is a 85 y.o. female with medical history significant for chronic diastolic CHF, atrial fibrillation on Eliquis, COPD, hypothyroidism, chronic kidney disease stage III, and chronic 3 L/min supplemental oxygen requirement, now presenting to the emergency department for evaluation of shortness of breath.  Patient was admitted to the hospital last month and discharged on 05/02/2018 after diuresis, treatment of COPD exacerbation, and right-sided thoracentesis.  She had been stable at her nursing facility on 3 L/min of supplemental oxygen since time of discharge until she recently experienced rapidly progressive worsening in her dyspnea.  She has been coughing, but has not noted any fevers or chills.  She also denies chest pain.  She reports bilateral lower extremity swelling that has been going on for some time.  EMS was called out to her nursing facility today, she was found to be saturating in the low 60s, and was transported to the hospital on nonrebreather.  ED Course: Upon arrival to the ED, patient is found to be afebrile, saturating 91% on 15 L/min of supplemental oxygen, breathing 36 times per minute, tachycardic in the low 100s, and with stable blood pressure.  EKG features atrial fibrillation with rate 103, PVC, and QTc interval 507 ms.  Chest x-ray is notable for stable cardiomegaly with bilateral interstitial and alveolar airspace opacities and pleural effusions.  Chemistry panel also notable for a slight hyponatremia and a creatinine of 1.10, consistent with her apparent baseline.  CBC features a mild normocytic anemia, troponin is undetectable, and BNP is elevated to 316.  Right-sided thoracentesis was performed in the emergency department with close to 900 cc of fluid removed.  She was also treated with albuterol, prednisone, Atrovent,  cefepime, vancomycin, and 40 mg IV Lasix in the ED.  She reports some improvement after the thoracentesis but continues to require more than 10 L/min of supplemental oxygen to maintain a saturation of 90% while at rest.   Assessment & Plan:   Principal Problem:   Acute on chronic respiratory failure with hypoxia (HCC) Active Problems:   COPD (chronic obstructive pulmonary disease) (HCC)   Hypothyroidism   Atrial fibrillation (HCC)   CKD (chronic kidney disease) stage 3, GFR 30-59 ml/min (HCC)   Acute on chronic diastolic (congestive) heart failure (HCC)   Pleural effusion due to CHF (congestive heart failure) ([place])  1. Acute on chronic hypoxic respiratory failure  - As noted by admitting provider, presented from nursing home with SOB, requiring 15 Lpm of supplemental O2 in order to keep sat in low 90's  - She denies fevers or travel, but has been coughing, coming from nursing home, and was recently hospitalized (flu pcr neg during admission)  - She was recently discharged with 3 Lpm, now requiring 15 Lpm  - I agree, most likely secondary to CHF, though in light of the current pandemic, it is difficult to exclude COVID in this patient with cough and potential exposures at nursing facility and hospital  - Treat CHF as below, send swab for COVID testing, continue infection-prevention measures   with droplet and contact precautions  2. Acute on chronic diastolic CHF  - Presents with worsening SOB, found to be saturating 60's by EMS  - There is peripheral edema, elevated BNP, pleural effusions, and interstitial and alveolar opacity on CXR that most likely reflects edema  - She was given  40 mg IV Lasix and had right thoracentesis in ED  but still required 10 L after thoracentesis - Recent echo with preserved EF  - Continue diuresis with Lasix 40 mg IV q12h, follow daily wt and I/O's    3. Pleural effusions  - Right thoracentesis performed last month and again in ED 05/19/18 with ~900 cc off;  she also has a moderate left-sided effusion noted on CXR   - There was concern for possible malignancy on recent imaging; pleural fluid cytology negative last month  - Most likely secondary to CHF  - Continue diuresis, consider left-sided thoracentesis if she fails to improve with diuresis    4. COPD  - No wheezing on admission  - Continue ICS/LABA, Spiriva, and as-needed albuterol MDI    5. Atrial fibrillation  - In atrial fibrillation in ED with rates 70's-110's  - Cardiology considering DCCV later this month  - CHADS-VASc is at least 64 (age x2, gender, CHF)  - Continue Eliquis, continue amiodarone (recently started, 400 mg qD until 05/30/18, then 200 mg qD)     6. Hypothyroidism  - Synthroid recently increased to 100 mcg daily, will continue    7. CKD stage III  - SCr is 1.1 on admission, consistent with her apparent baseline  - Renally-dose medications and recheck labs tomorrow morning  PPE: HAIR NET, CAPR, GOWN, GLOVES   DVT prophylaxis: [id number]  Code Status: DNR    Code Status Orders  (From admission, onward)         Start     Ordered   05/19/18 2151  Do not attempt resuscitation (DNR)  Continuous    Question Answer Comment  In the event of cardiac or respiratory ARREST Do not call a code blue   In the event of cardiac or respiratory ARREST Do not perform Intubation, CPR, defibrillation or ACLS   In the event of cardiac or respiratory ARREST Use medication by any route, position, wound care, and other measures to relive pain and suffering. May use oxygen, suction and manual treatment of airway obstruction as needed for comfort.      05/19/18 2153        Code Status History    Date Active Date Inactive Code Status Order ID Comments User Context   05/19/2018 1821 05/19/2018 2153 DNR [id number]  Katherine Pew, MD ED   04/27/2018 1445 05/02/2018 2047 DNR [id number]  Katherine Bongo, MD ED   03/17/2018 1912 03/21/2018 1710 DNR [id number]  Katherine Leff, MD ED    03/16/2012 1431 03/20/2012 1717 DNR [id number]  Katherine Curtis, RN Inpatient   07/23/2011 2340 07/27/2011 1831 DNR [id number]  Katherine Moore, RN Inpatient    Advance Directive Documentation     Most Recent Value  Type of Advance Directive  Out of facility DNR (pink MOST or yellow form), Healthcare Power of Attorney  Pre-existing out of facility DNR order (yellow form or pink MOST form)  --  "MOST" Form in Place?  --     Family Communication: Left message with patient's niece, no answer on phone Disposition Plan:   Changed to inpatient for acute hypoxic respiratory failure requiring aggressive supplemental oxygen with 100% nonrebreather complicated by CHF exacerbation, possible underlying coronavirus infection.  Patient require frequent monitoring, electrolyte monitoring, IV diuresis, and supportive care. Consults called: None Admission status: inpt   Consultants:   None  Procedures:  Dg Chest 1 View  Result Date: 04/29/2018 CLINICAL DATA:  Post right-sided thoracentesis. EXAM: CHEST  1 VIEW  COMPARISON:  04/28/2018; chest CT-04/29/2018 FINDINGS: Grossly unchanged enlarged cardiac silhouette and mediastinal contours with atherosclerotic plaque within thoracic aorta. There is persistent rightward deviation the tracheal air, the level of the aortic arch. There is persistent thickening the right paratracheal stripe presumably to prominent vasculature. Interval reduction in persistent small right-sided pleural effusion post thoracentesis. No pneumothorax. Improved aeration the right lung base with persistent right basilar opacities. Unchanged small to moderate size left-sided effusion and associated left basilar heterogeneous/consolidative opacities. Pulmonary vasculature remains indistinct with cephalization of flow. Moderate scoliotic curvature of the thoracolumbar spine, incompletely evaluated. No definite acute osseous abnormalities. IMPRESSION: 1. Interval reduction in persistent small  right-sided effusion post thoracentesis. No pneumothorax. 2. Improved aeration the right lung base with persistent right basilar opacities, likely atelectasis. 3. Similar findings of pulmonary edema with small to moderate size left-sided effusion associated left basilar opacities, likely atelectasis. Electronically Signed   By: Sandi Mariscal M.D.   On: 04/29/2018 16:27   Dg Chest 2 View  Result Date: 05/02/2018 CLINICAL DATA:  Follow-up recent pneumonia. EXAM: CHEST - 2 VIEW COMPARISON:  05/01/2018 and 03/17/2018 as well as recent chest CT 04/29/2018 FINDINGS: Lungs are adequately inflated demonstrate stable patchy density over the right upper lobe known to represent area of scarring with associated suspicious 1.4 cm nodule by recent CT. Stable small to moderate right pleural effusion and stable small left pleural effusion. Likely associated bibasilar atelectasis. Infection in the lung bases is possible. Cardiomediastinal silhouette and remainder of the exam is unchanged. IMPRESSION: Stable patchy density in the right upper lobe known to represent scarring with associated suspicious 1.4 cm nodule by recent chest CT. Stable bilateral pleural effusions likely with associated basilar atelectasis. Infection in the lung bases is possible. Electronically Signed   By: Marin Olp M.D.   On: 05/02/2018 09:07   Dg Chest 2 View  Result Date: 05/01/2018 CLINICAL DATA:  Hypoxia. EXAM: CHEST - 2 VIEW COMPARISON:  04/29/2018 FINDINGS: Improved aeration in the left lower chest is suggestive for decreasing pleural fluid. Cardiac silhouette remains enlarged. Right pleural effusion may slightly enlarged. Again noted are patchy densities in the right mid and upper lung. Severe kyphotic deformity near the thoracolumbar spine with probable compression deformity. Difficult to evaluate for interval change in the spine. IMPRESSION: Slightly increased patchy densities in the right lung are worrisome for pneumonia. Slightly increased  right pleural fluid. Improved aeration in the left lower chest may represent decreased left pleural effusion. Electronically Signed   By: Markus Daft M.D.   On: 05/01/2018 08:21   Ct Chest Wo Contrast  Result Date: 04/29/2018 CLINICAL DATA:  Bilateral pleural effusions on chest radiograph EXAM: CT CHEST WITHOUT CONTRAST TECHNIQUE: Multidetector CT imaging of the chest was performed following the standard protocol without IV contrast. COMPARISON:  Chest radiograph dated 04/28/2018 FINDINGS: Cardiovascular: Mild cardiomegaly with left atrial enlargement. No evidence of thoracic aortic aneurysm. Atherosclerotic calcifications of the aortic arch. Three vessel coronary atherosclerosis. Mediastinum/Nodes: Mediastinal lymphadenopathy, including a dominant 2.5 cm short axis node in the low right paratracheal region (series 3/image 60) and 1.6 cm short axis subcarinal node (series 3/image 69). Visualized thyroid is notable for an 8 mm calcified right thyroid nodule. Lungs/Pleura: 13 x 14 mm central right upper lobe nodule (series 4/image 87), suspicious for primary bronchogenic neoplasm. Additional linear/platelike opacities in the right upper lobe, nonspecific. Right lower lobe opacity favors compressive atelectasis (series 3/image 86). Additional atelectasis in the right middle lobe and left lower lobe. Moderate right and  small left pleural effusions. Scattered interstitial opacities with interlobular septal thickening in the bilateral upper lobes, suggesting very mild edema. Mild to moderate centrilobular emphysematous changes, upper lobe predominant. No pneumothorax. Upper Abdomen: Visualized upper abdomen is notable for vascular calcifications. Musculoskeletal: Degenerative changes of the visualized thoracolumbar spine. IMPRESSION: 14 mm right upper lobe nodule, suspicious for primary bronchogenic neoplasm. Associated mediastinal lymphadenopathy, including a dominant 2.5 cm short axis node in the low right  paratracheal region. Cardiomegaly with possible mild interstitial edema. Moderate right and small left pleural effusions. Aortic Atherosclerosis (ICD10-I70.0) and Emphysema (ICD10-J43.9). Electronically Signed   By: Julian Hy M.D.   On: 04/29/2018 11:09   Dg Chest Portable 1 View  Result Date: 05/19/2018 CLINICAL DATA:  Evaluate for post thoracentesis. EXAM: PORTABLE CHEST 1 VIEW COMPARISON:  05/19/2018 FINDINGS: Lungs are adequately inflated demonstrate moderate interval improvement in the right-sided pleural effusion as there is a small amount of residual right pleural fluid likely with associated basilar atelectasis. Moderate left pleural effusion slightly worse. Likely associated left basilar atelectasis. No pneumothorax. Prominence of the perihilar markings suggesting mild interstitial edema and less likely infection. Mild stable cardiomegaly. Remainder of the exam is unchanged. IMPRESSION: Moderate improvement and right-sided pleural effusion with small residual right effusion likely with associated basilar atelectasis. No pneumothorax. Slight worsening moderate size left pleural effusion likely with associated basilar atelectasis. Mild cardiomegaly with suggestion of interstitial edema and less likely infection. Electronically Signed   By: Marin Olp M.D.   On: 05/19/2018 21:15   Dg Chest Portable 1 View  Result Date: 05/19/2018 CLINICAL DATA:  Shortness of breath EXAM: PORTABLE CHEST 1 VIEW COMPARISON:  05/02/2018 FINDINGS: Diffuse bilateral interstitial and patchy alveolar airspace opacities. Small bilateral pleural effusions right greater than left. No pneumothorax. Stable cardiomegaly. No acute osseous abnormality. IMPRESSION: Stable cardiomegaly with bilateral interstitial and alveolar airspace opacities with pleural effusions. Differential considerations include pulmonary edema versus multilobar pneumonia. Electronically Signed   By: Kathreen Devoid   On: 05/19/2018 19:10   Dg Chest Port  1 View  Result Date: 04/28/2018 CLINICAL DATA:  Shortness of breath. EXAM: PORTABLE CHEST 1 VIEW COMPARISON:  04/27/2018 and prior exams FINDINGS: Cardiomegaly, pulmonary vascular congestion, interstitial pulmonary edema, bilateral pleural effusions, RIGHT greater than LEFT, and bilateral LOWER lung atelectasis again noted. No pneumothorax. No significant change from the prior study. IMPRESSION: Unchanged appearance of the chest with pulmonary edema, bilateral pleural effusions, RIGHT greater than LEFT, and bibasilar atelectasis. Electronically Signed   By: Margarette Canada M.D.   On: 04/28/2018 09:54   Dg Chest Portable 1 View  Result Date: 04/27/2018 CLINICAL DATA:  Shortness of breath. EXAM: PORTABLE CHEST 1 VIEW COMPARISON:  Radiograph March 17, 2018. FINDINGS: Stable cardiomegaly. Atherosclerosis of thoracic aorta is noted. No pneumothorax is noted. Interval development of mild to moderate bilateral pleural effusions are noted. Bilateral interstitial densities are noted which may represent edema. Bony thorax is unremarkable. IMPRESSION: Stable cardiomegaly with probable bilateral pulmonary edema and interval development of mild to moderate bilateral pleural effusions. Electronically Signed   By: Marijo Conception, M.D.   On: 04/27/2018 13:35   US Thoracentesis Asp Pleural Space W/img Guide  Result Date: 04/30/2018 INDICATION: Patient with history of acute on chronic diastolic HF, dyspnea, and right pleural effusion. Request made for diagnostic and therapeutic right thoracentesis. EXAM: ULTRASOUND GUIDED DIAGNOSTIC AND THERAPEUTIC RIGHT THORACENTESIS MEDICATIONS: 8 mL 1% lidocaine COMPLICATIONS: None immediate. PROCEDURE: An ultrasound guided thoracentesis was thoroughly discussed with the patient and questions answered.  The benefits, risks, alternatives and complications were also discussed. The patient understands and wishes to proceed with the procedure. Written consent was obtained. Ultrasound was  performed to localize and mark an adequate pocket of fluid in the right chest. The area was then prepped and draped in the normal sterile fashion. 1% Lidocaine was used for local anesthesia. Under ultrasound guidance a 6 Fr Safe-T-Centesis catheter was introduced. Thoracentesis was performed. The catheter was removed and a dressing applied. FINDINGS: A total of approximately 1.1 L of hazy amber fluid was removed. Samples were sent to the laboratory as requested by the clinical team. IMPRESSION: Successful ultrasound guided right thoracentesis yielding 1.1 L of pleural fluid. Read by: Earley Abide, PA-C Electronically Signed   By: Sandi Mariscal M.D.   On: 04/29/2018 16:31     Antimicrobials:   Vancomycin and cefepime day #2   Subjective: Patient reports breathing is somewhat improved this morning.  Still short of breath on a nonrebreather but improving,  Objective: Vitals:   05/20/18 0500 05/20/18 0632 05/20/18 0900 05/20/18 1000  BP:  (!) 114/57 126/68 (!) 105/47  Pulse:  (!) 109 85 98  Resp:  (!) 22 15 (!) 21  Temp:   97.8 F (36.6 C)   TempSrc:   Oral   SpO2:  95% 100% 97%  Weight: 46.7 kg     Height:        Intake/Output Summary (Last 24 hours) at 05/20/2018 1115 Last data filed at 05/20/2018 0900 Gross per 24 hour  Intake 0 ml  Output 400 ml  Net -400 ml   Filed Weights   05/19/18 2324 05/20/18 0500  Weight: 46.7 kg 46.7 kg    Examination:  General exam: Appears calm and comfortable  Respiratory system: Rhonchi bilaterally, rales bilaterally, decreased breath sounds left side. Cardiovascular system: Sinus tachycardia, no murmur noted  gastrointestinal system: Abdomen is nondistended, soft and nontender. No organomegaly or masses felt. Normal bowel sounds heard. Central nervous system: Alert and oriented. No focal neurological deficits. Extremities: Symmetric 5 x 5 power, no contractures. Skin: No rashes, lesions or ulcers Psychiatry: Judgement and insight appear normal.  Mood & affect appropriate.     Data Reviewed: I have personally reviewed following labs and imaging studies  CBC: Recent Labs  Lab 05/19/18 2000  WBC 10.0  NEUTROABS 7.9*  HGB 11.2*  HCT 35.8*  MCV 94.7  PLT 850   Basic Metabolic Panel: Recent Labs  Lab 05/19/18 2000 05/20/18 0054  NA 134* 134*  K 3.6 3.5  CL 95* 94*  CO2 26 26  GLUCOSE 117* 157*  BUN 18 17  CREATININE 1.10* 1.11*  CALCIUM 8.8* 8.5*   GFR: Estimated Creatinine Clearance: 18.3 mL/min (A) (by C-G formula based on SCr of 1.11 mg/dL (H)). Liver Function Tests: Recent Labs  Lab 05/19/18 2000  AST 19  ALT 19  ALKPHOS 79  BILITOT 0.7  PROT 6.3*  ALBUMIN 3.1*   No results for input(s): LIPASE, AMYLASE in the last 168 hours. No results for input(s): AMMONIA in the last 168 hours. Coagulation Profile: No results for input(s): INR, PROTIME in the last 168 hours. Cardiac Enzymes: Recent Labs  Lab 05/19/18 2000  TROPONINI <0.03   BNP (last 3 results) No results for input(s): PROBNP in the last 8760 hours. HbA1C: No results for input(s): HGBA1C in the last 72 hours. CBG: No results for input(s): GLUCAP in the last 168 hours. Lipid Profile: No results for input(s): CHOL, HDL, LDLCALC, TRIG, CHOLHDL, LDLDIRECT  in the last 72 hours. Thyroid Function Tests: No results for input(s): TSH, T4TOTAL, FREET4, T3FREE, THYROIDAB in the last 72 hours. Anemia Panel: No results for input(s): VITAMINB12, FOLATE, FERRITIN, TIBC, IRON, RETICCTPCT in the last 72 hours. Sepsis Labs: No results for input(s): PROCALCITON, LATICACIDVEN in the last 168 hours.  Recent Results (from the past 240 hour(s))  Gram stain     Status: None   Collection Time: 05/19/18  7:31 PM  Result Value Ref Range Status   Specimen Description PLEURAL  Final   Special Requests NONE  Final   Gram Stain   Final    WBC PRESENT, PREDOMINANTLY MONONUCLEAR NO ORGANISMS SEEN Performed at [facility], [address] [place],  [place] [address]    Report Status 05/19/2018 FINAL  Final  MRSA PCR Screening     Status: None   Collection Time: 05/20/18 12:01 AM  Result Value Ref Range Status   MRSA by PCR NEGATIVE NEGATIVE Final    Comment:        The GeneXpert MRSA Assay (FDA approved for NASAL specimens only), is one component of a comprehensive MRSA colonization surveillance program. It is not intended to diagnose MRSA infection nor to guide or monitor treatment for MRSA infections. Performed at [facility], [place] [address] [place], [place] [address]   Blood culture (routine x 2)     Status: None (Preliminary result)   Collection Time: 05/20/18 12:54 AM  Result Value Ref Range Status   Specimen Description BLOOD RIGHT FOREARM  Final   Special Requests   Final    BOTTLES DRAWN AEROBIC AND ANAEROBIC Blood Culture adequate volume Performed at [facility], [place] [address] [place], [place] [address]    Culture PENDING  Incomplete   Report Status PENDING  Incomplete         Radiology Studies: Dg Chest Portable 1 View  Result Date: 05/19/2018 CLINICAL DATA:  Evaluate for post thoracentesis. EXAM: PORTABLE CHEST 1 VIEW COMPARISON:  05/19/2018 FINDINGS: Lungs are adequately inflated demonstrate moderate interval improvement in the right-sided pleural effusion as there is a small amount of residual right pleural fluid likely with associated basilar atelectasis. Moderate left pleural effusion slightly worse. Likely associated left basilar atelectasis. No pneumothorax. Prominence of the perihilar markings suggesting mild interstitial edema and less likely infection. Mild stable cardiomegaly. Remainder of the exam is unchanged. IMPRESSION: Moderate improvement and right-sided pleural effusion with small residual right effusion likely with associated basilar atelectasis. No pneumothorax. Slight worsening moderate size left pleural effusion likely with associated basilar atelectasis. Mild cardiomegaly with  suggestion of interstitial edema and less likely infection. Electronically Signed   By: Marin Olp M.D.   On: 05/19/2018 21:15   Dg Chest Portable 1 View  Result Date: 05/19/2018 CLINICAL DATA:  Shortness of breath EXAM: PORTABLE CHEST 1 VIEW COMPARISON:  05/02/2018 FINDINGS: Diffuse bilateral interstitial and patchy alveolar airspace opacities. Small bilateral pleural effusions right greater than left. No pneumothorax. Stable cardiomegaly. No acute osseous abnormality. IMPRESSION: Stable cardiomegaly with bilateral interstitial and alveolar airspace opacities with pleural effusions. Differential considerations include pulmonary edema versus multilobar pneumonia. Electronically Signed   By: Kathreen Devoid   On: 05/19/2018 19:10        Scheduled Meds:  amiodarone  400 mg Oral Daily   apixaban  2.5 mg Oral BID   furosemide  40 mg Intravenous Q12H   ipratropium  4 puff Inhalation Once   levothyroxine  100 mcg Oral Q0600  mometasone-formoterol  2 puff Inhalation BID   sodium chloride flush  3 mL Intravenous Q12H   umeclidinium bromide  1 puff Inhalation Daily   Continuous Infusions:  sodium chloride       LOS: 0 days    Time spent: [place], MD [facility]  If 7PM-7AM, please contact night-coverage  05/20/2018, 11:15 AM

## 2018-05-21 LAB — CBC WITH DIFFERENTIAL/PLATELET
Abs Immature Granulocytes: 0.05 10*3/uL (ref 0.00–0.07)
Basophils Absolute: 0.1 10*3/uL (ref 0.0–0.1)
Basophils Relative: 0 %
Eosinophils Absolute: 0.1 10*3/uL (ref 0.0–0.5)
Eosinophils Relative: 1 %
HCT: 31.9 % — ABNORMAL LOW (ref 36.0–46.0)
Hemoglobin: 10.4 g/dL — ABNORMAL LOW (ref 12.0–15.0)
Immature Granulocytes: 0 %
Lymphocytes Relative: 12 %
Lymphs Abs: 1.4 10*3/uL (ref 0.7–4.0)
MCH: 30.8 pg (ref 26.0–34.0)
MCHC: 32.6 g/dL (ref 30.0–36.0)
MCV: 94.4 fL (ref 80.0–100.0)
Monocytes Absolute: 1.3 10*3/uL — ABNORMAL HIGH (ref 0.1–1.0)
Monocytes Relative: 11 %
Neutro Abs: 9.1 10*3/uL — ABNORMAL HIGH (ref 1.7–7.7)
Neutrophils Relative %: 76 %
Platelets: 289 10*3/uL (ref 150–400)
RBC: 3.38 MIL/uL — ABNORMAL LOW (ref 3.87–5.11)
RDW: 14.6 % (ref 11.5–15.5)
WBC: 12 10*3/uL — ABNORMAL HIGH (ref 4.0–10.5)
nRBC: 0 % (ref 0.0–0.2)

## 2018-05-21 LAB — BASIC METABOLIC PANEL
Anion gap: 10 (ref 5–15)
BUN: 26 mg/dL — ABNORMAL HIGH (ref 8–23)
CO2: 33 mmol/L — ABNORMAL HIGH (ref 22–32)
Calcium: 9.1 mg/dL (ref 8.9–10.3)
Chloride: 95 mmol/L — ABNORMAL LOW (ref 98–111)
Creatinine, Ser: 1.48 mg/dL — ABNORMAL HIGH (ref 0.44–1.00)
GFR calc Af Amer: 34 mL/min — ABNORMAL LOW (ref >60)
GFR calc non Af Amer: 29 mL/min — ABNORMAL LOW (ref >60)
Glucose, Bld: 96 mg/dL (ref 70–99)
Potassium: 3.8 mmol/L (ref 3.5–5.1)
Sodium: 138 mmol/L (ref 135–145)

## 2018-05-21 NOTE — Progress Notes (Signed)
PROGRESS NOTE    Katherine [name]  ASN:[id number] DOB: [birth year] DOA: 05/19/2018 PCP: [name], MD   Brief Narrative:  Per admitting provider: Netanya Yazdani Smithis a 85 y.o.femalewith medical history significant forchronic diastolic CHF, atrial fibrillation on Eliquis, COPD, hypothyroidism, chronic kidney disease stage III, and chronic 3 L/min supplemental oxygen requirement, now presenting to the emergency department for evaluation of shortness of breath. Patient was admitted to the hospital last month and discharged on 05/02/2018 after diuresis, treatment of COPD exacerbation, and right-sided thoracentesis. She had been stable at her nursing facility on 3 L/min of supplemental oxygen since time of discharge until she recently experienced rapidly progressive worsening in her dyspnea. She has been coughing, but has not noted any fevers or chills. She also denies chest pain. She reports bilateral lower extremity swelling that has been going on for some time. EMS was called out to her nursing facility today, she was found to be saturating in the low 60s, and was transported to the hospital on nonrebreather.  Since admission to the hospital patient is still requiring significant amounts of oxygen currently on 13 L nonrebreather although was not in distress clinically when being evaluated.  Case was discussed with critical care with recommendation to continue current level of care.  Patient is DNR  Assessment & Plan:   Principal Problem:   Acute on chronic respiratory failure with hypoxia (HCC) Active Problems:   COPD (chronic obstructive pulmonary disease) (HCC)   Hypothyroidism   Atrial fibrillation (HCC)   CKD (chronic kidney disease) stage 3, GFR 30-59 ml/min (HCC)   Acute on chronic diastolic (congestive) heart failure (HCC)   Pleural effusion due to CHF (congestive heart failure) (HCC)   Acute respiratory failure with hypoxia and hypercapnia ([place])   1.Acute on chronic hypoxic  respiratory failure -As noted by admitting provider, presented from nursing home with SOB, requiring 15 Lpm of supplemental O2 in order to keep sat in low 90's -She denies fevers or travel, but has been coughing, coming from nursing home, and was recently hospitalized (flu pcr neg during admission) -She was recently discharged with 3 Lpm, now requiring 15 Lpm -I agree, most likely secondary to CHF, though in light of the current pandemic, it is difficult to exclude COVID in this patient with cough and potential exposures at nursing facility and hospital -Treat CHF as below, FOLLOW swab for COVID testing-we will need results before being able to transfer back to her skilled nursing facility, continue infection-prevention measures with droplet and contact precautions  2.Acute on chronic diastolic CHF -Presents with worsening SOB, found to be saturating 60's by EMS -There is peripheral edema, elevated BNP, pleural effusions, and interstitial and alveolar opacity on CXR that most likely reflects edema -She was given 40 mg IV Lasix and had right thoracentesis in ED but still required 10 L after thoracentesis -Recent echo with preserved EF -Continue diuresis with Lasix 40 mg IV q12h, follow daily wt and I/O's -Check chest x-ray in the morning to eval for left THORA-although unlikely that a moderate left pleural effusion explains CURRENT O2 need  3.Pleural effusions -Right thoracentesis performed last month and again in ED 05/19/18 with ~900 cc off; she also has a moderate left-sided effusion noted on CXR -There was concern for possible malignancy on recent imaging; pleural fluid cytology negative last month -Most likely secondary to CHF -Continue diuresis, consider left-sided thoracentesis if she fails to improve with diuresisas above.  4.COPD -No wheezing on admission -Continue ICS/LABA, Spiriva, and  as-needed albuterol MDI  5.Atrial  fibrillation -In atrial fibrillation with intermittent breakthru RVR - Cardiology considering DCCV later this month -CHADS-VASc is at least 53 (age x2, gender, CHF) -Continue Eliquis, continue amiodarone (recently started, 400 mg qD until 05/30/18, then 200 mg qD)  6.Hypothyroidism -Synthroid recently increased to 100 mcg daily, will continue  7.CKD stage III -SCr is 1.1 on admission, consistent with her apparent baseline -Renally-dose medications and recheck labs tomorrow morning  PPE: HAIR NET, CAPR, GOWN, GLOVES  DVT prophylaxis: [id number]  Code Status: DNR    Code Status Orders  (From admission, onward)         Start     Ordered   05/19/18 2151  Do not attempt resuscitation (DNR)  Continuous    Question Answer Comment  In the event of cardiac or respiratory ARREST Do not call a code blue   In the event of cardiac or respiratory ARREST Do not perform Intubation, CPR, defibrillation or ACLS   In the event of cardiac or respiratory ARREST Use medication by any route, position, wound care, and other measures to relive pain and suffering. May use oxygen, suction and manual treatment of airway obstruction as needed for comfort.      05/19/18 2153        Code Status History    Date Active Date Inactive Code Status Order ID Comments User Context   05/19/2018 1821 05/19/2018 2153 DNR [id number]  Merrily Pew, MD ED   04/27/2018 1445 05/02/2018 2047 DNR [id number]  Karmen Bongo, MD ED   03/17/2018 1912 03/21/2018 1710 DNR [id number]  Shela Leff, MD ED   03/16/2012 1431 03/20/2012 1717 DNR [id number]  Wilfred Curtis, RN Inpatient   07/23/2011 2340 07/27/2011 1831 DNR [id number]  Tresa Moore, RN Inpatient    Advance Directive Documentation     Most Recent Value  Type of Advance Directive  Out of facility DNR (pink MOST or yellow form), Healthcare Power of Attorney  Pre-existing out of facility DNR order (yellow form or pink MOST form)  --  "MOST"  Form in Place?  --     Family Communication: With her niece Ms. cheek, Disposition Plan:   Continue as inpatient for acute hypoxic respiratory failure requiring aggressive supplemental oxygen with 100% nonrebreather complicated by CHF exacerbation, and possible underlying coronavirus infection.  Patient require frequent monitoring, electrolyte monitoring, IV diuresis, respiratory support and and supportive care Consults called: Case discussed with PCCM Admission status: Inpatient   Consultants:   As above  Procedures:  Dg Chest 1 View  Result Date: 04/29/2018 CLINICAL DATA:  Post right-sided thoracentesis. EXAM: CHEST  1 VIEW COMPARISON:  04/28/2018; chest CT-04/29/2018 FINDINGS: Grossly unchanged enlarged cardiac silhouette and mediastinal contours with atherosclerotic plaque within thoracic aorta. There is persistent rightward deviation the tracheal air, the level of the aortic arch. There is persistent thickening the right paratracheal stripe presumably to prominent vasculature. Interval reduction in persistent small right-sided pleural effusion post thoracentesis. No pneumothorax. Improved aeration the right lung base with persistent right basilar opacities. Unchanged small to moderate size left-sided effusion and associated left basilar heterogeneous/consolidative opacities. Pulmonary vasculature remains indistinct with cephalization of flow. Moderate scoliotic curvature of the thoracolumbar spine, incompletely evaluated. No definite acute osseous abnormalities. IMPRESSION: 1. Interval reduction in persistent small right-sided effusion post thoracentesis. No pneumothorax. 2. Improved aeration the right lung base with persistent right basilar opacities, likely atelectasis. 3. Similar findings of pulmonary edema with small to moderate size left-sided effusion associated left basilar opacities,  likely atelectasis. Electronically Signed   By: Sandi Mariscal M.D.   On: 04/29/2018 16:27   Dg Chest 2  View  Result Date: 05/02/2018 CLINICAL DATA:  Follow-up recent pneumonia. EXAM: CHEST - 2 VIEW COMPARISON:  05/01/2018 and 03/17/2018 as well as recent chest CT 04/29/2018 FINDINGS: Lungs are adequately inflated demonstrate stable patchy density over the right upper lobe known to represent area of scarring with associated suspicious 1.4 cm nodule by recent CT. Stable small to moderate right pleural effusion and stable small left pleural effusion. Likely associated bibasilar atelectasis. Infection in the lung bases is possible. Cardiomediastinal silhouette and remainder of the exam is unchanged. IMPRESSION: Stable patchy density in the right upper lobe known to represent scarring with associated suspicious 1.4 cm nodule by recent chest CT. Stable bilateral pleural effusions likely with associated basilar atelectasis. Infection in the lung bases is possible. Electronically Signed   By: Marin Olp M.D.   On: 05/02/2018 09:07   Dg Chest 2 View  Result Date: 05/01/2018 CLINICAL DATA:  Hypoxia. EXAM: CHEST - 2 VIEW COMPARISON:  04/29/2018 FINDINGS: Improved aeration in the left lower chest is suggestive for decreasing pleural fluid. Cardiac silhouette remains enlarged. Right pleural effusion may slightly enlarged. Again noted are patchy densities in the right mid and upper lung. Severe kyphotic deformity near the thoracolumbar spine with probable compression deformity. Difficult to evaluate for interval change in the spine. IMPRESSION: Slightly increased patchy densities in the right lung are worrisome for pneumonia. Slightly increased right pleural fluid. Improved aeration in the left lower chest may represent decreased left pleural effusion. Electronically Signed   By: Markus Daft M.D.   On: 05/01/2018 08:21   Ct Chest Wo Contrast  Result Date: 04/29/2018 CLINICAL DATA:  Bilateral pleural effusions on chest radiograph EXAM: CT CHEST WITHOUT CONTRAST TECHNIQUE: Multidetector CT imaging of the chest was  performed following the standard protocol without IV contrast. COMPARISON:  Chest radiograph dated 04/28/2018 FINDINGS: Cardiovascular: Mild cardiomegaly with left atrial enlargement. No evidence of thoracic aortic aneurysm. Atherosclerotic calcifications of the aortic arch. Three vessel coronary atherosclerosis. Mediastinum/Nodes: Mediastinal lymphadenopathy, including a dominant 2.5 cm short axis node in the low right paratracheal region (series 3/image 60) and 1.6 cm short axis subcarinal node (series 3/image 69). Visualized thyroid is notable for an 8 mm calcified right thyroid nodule. Lungs/Pleura: 13 x 14 mm central right upper lobe nodule (series 4/image 87), suspicious for primary bronchogenic neoplasm. Additional linear/platelike opacities in the right upper lobe, nonspecific. Right lower lobe opacity favors compressive atelectasis (series 3/image 86). Additional atelectasis in the right middle lobe and left lower lobe. Moderate right and small left pleural effusions. Scattered interstitial opacities with interlobular septal thickening in the bilateral upper lobes, suggesting very mild edema. Mild to moderate centrilobular emphysematous changes, upper lobe predominant. No pneumothorax. Upper Abdomen: Visualized upper abdomen is notable for vascular calcifications. Musculoskeletal: Degenerative changes of the visualized thoracolumbar spine. IMPRESSION: 14 mm right upper lobe nodule, suspicious for primary bronchogenic neoplasm. Associated mediastinal lymphadenopathy, including a dominant 2.5 cm short axis node in the low right paratracheal region. Cardiomegaly with possible mild interstitial edema. Moderate right and small left pleural effusions. Aortic Atherosclerosis (ICD10-I70.0) and Emphysema (ICD10-J43.9). Electronically Signed   By: Julian Hy M.D.   On: 04/29/2018 11:09   Dg Chest Portable 1 View  Result Date: 05/19/2018 CLINICAL DATA:  Evaluate for post thoracentesis. EXAM: PORTABLE CHEST 1  VIEW COMPARISON:  05/19/2018 FINDINGS: Lungs are adequately inflated demonstrate moderate interval improvement in  the right-sided pleural effusion as there is a small amount of residual right pleural fluid likely with associated basilar atelectasis. Moderate left pleural effusion slightly worse. Likely associated left basilar atelectasis. No pneumothorax. Prominence of the perihilar markings suggesting mild interstitial edema and less likely infection. Mild stable cardiomegaly. Remainder of the exam is unchanged. IMPRESSION: Moderate improvement and right-sided pleural effusion with small residual right effusion likely with associated basilar atelectasis. No pneumothorax. Slight worsening moderate size left pleural effusion likely with associated basilar atelectasis. Mild cardiomegaly with suggestion of interstitial edema and less likely infection. Electronically Signed   By: Marin Olp M.D.   On: 05/19/2018 21:15   Dg Chest Portable 1 View  Result Date: 05/19/2018 CLINICAL DATA:  Shortness of breath EXAM: PORTABLE CHEST 1 VIEW COMPARISON:  05/02/2018 FINDINGS: Diffuse bilateral interstitial and patchy alveolar airspace opacities. Small bilateral pleural effusions right greater than left. No pneumothorax. Stable cardiomegaly. No acute osseous abnormality. IMPRESSION: Stable cardiomegaly with bilateral interstitial and alveolar airspace opacities with pleural effusions. Differential considerations include pulmonary edema versus multilobar pneumonia. Electronically Signed   By: Kathreen Devoid   On: 05/19/2018 19:10   Dg Chest Port 1 View  Result Date: 04/28/2018 CLINICAL DATA:  Shortness of breath. EXAM: PORTABLE CHEST 1 VIEW COMPARISON:  04/27/2018 and prior exams FINDINGS: Cardiomegaly, pulmonary vascular congestion, interstitial pulmonary edema, bilateral pleural effusions, RIGHT greater than LEFT, and bilateral LOWER lung atelectasis again noted. No pneumothorax. No significant change from the prior study.  IMPRESSION: Unchanged appearance of the chest with pulmonary edema, bilateral pleural effusions, RIGHT greater than LEFT, and bibasilar atelectasis. Electronically Signed   By: Margarette Canada M.D.   On: 04/28/2018 09:54   Dg Chest Portable 1 View  Result Date: 04/27/2018 CLINICAL DATA:  Shortness of breath. EXAM: PORTABLE CHEST 1 VIEW COMPARISON:  Radiograph March 17, 2018. FINDINGS: Stable cardiomegaly. Atherosclerosis of thoracic aorta is noted. No pneumothorax is noted. Interval development of mild to moderate bilateral pleural effusions are noted. Bilateral interstitial densities are noted which may represent edema. Bony thorax is unremarkable. IMPRESSION: Stable cardiomegaly with probable bilateral pulmonary edema and interval development of mild to moderate bilateral pleural effusions. Electronically Signed   By: Marijo Conception, M.D.   On: 04/27/2018 13:35   US Thoracentesis Asp Pleural Space W/img Guide  Result Date: 04/30/2018 INDICATION: Patient with history of acute on chronic diastolic HF, dyspnea, and right pleural effusion. Request made for diagnostic and therapeutic right thoracentesis. EXAM: ULTRASOUND GUIDED DIAGNOSTIC AND THERAPEUTIC RIGHT THORACENTESIS MEDICATIONS: 8 mL 1% lidocaine COMPLICATIONS: None immediate. PROCEDURE: An ultrasound guided thoracentesis was thoroughly discussed with the patient and questions answered. The benefits, risks, alternatives and complications were also discussed. The patient understands and wishes to proceed with the procedure. Written consent was obtained. Ultrasound was performed to localize and mark an adequate pocket of fluid in the right chest. The area was then prepped and draped in the normal sterile fashion. 1% Lidocaine was used for local anesthesia. Under ultrasound guidance a 6 Fr Safe-T-Centesis catheter was introduced. Thoracentesis was performed. The catheter was removed and a dressing applied. FINDINGS: A total of approximately 1.1 L of hazy  amber fluid was removed. Samples were sent to the laboratory as requested by the clinical team. IMPRESSION: Successful ultrasound guided right thoracentesis yielding 1.1 L of pleural fluid. Read by: Earley Abide, PA-C Electronically Signed   By: Sandi Mariscal M.D.   On: 04/29/2018 16:31     Antimicrobials:    No current antibiotics, possible  COVID-19, chest x-ray concerning for edema unlikely infection   Subjective: On exam patient reports she feels well no compromise breathing although is on 13 L nonrebreather.  Objective: Vitals:   05/21/18 0000 05/21/18 0400 05/21/18 0600 05/21/18 0950  BP: (!) 100/54 (!) 148/77 135/60 (!) 133/58  Pulse: (!) 121 (!) 101 81 (!) 128  Resp: (!) 22 (!) 23 (!) 22 14  Temp: 98.1 F (36.7 C) 97.6 F (36.4 C)  97.8 F (36.6 C)  TempSrc: Oral Oral  Oral  SpO2: 99% 98% 98% 98%  Weight:      Height:        Intake/Output Summary (Last 24 hours) at 05/21/2018 1126 Last data filed at 05/21/2018 1000 Gross per 24 hour  Intake 240 ml  Output 1250 ml  Net -1010 ml   Filed Weights   05/19/18 2324 05/20/18 0500  Weight: 46.7 kg 46.7 kg    Examination:  General exam: Appears calm and comfortable  Respiratory system: Mild to moderate rhonchi bilaterally, no accessory muscle use  cardiovascular system: Irregularly irregular rate controlled on my eval  gastrointestinal system: Abdomen is nondistended, soft and nontender. No organomegaly or masses felt. Normal bowel sounds heard. Central nervous system: Alert and oriented. No focal neurological deficits. Extremities: Symmetric 5 x 5 power.  No contractures Skin: No rashes, lesions or ulcers Psychiatry: Judgement and insight appear normal. Mood & affect appropriate.     Data Reviewed: I have personally reviewed following labs and imaging studies  CBC: Recent Labs  Lab 05/19/18 2000 05/21/18 0815  WBC 10.0 12.0*  NEUTROABS 7.9* 9.1*  HGB 11.2* 10.4*  HCT 35.8* 31.9*  MCV 94.7 94.4  PLT 248 308    Basic Metabolic Panel: Recent Labs  Lab 05/19/18 2000 05/20/18 0054 05/21/18 0815  NA 134* 134* 138  K 3.6 3.5 3.8  CL 95* 94* 95*  CO2 26 26 33*  GLUCOSE 117* 157* 96  BUN 18 17 26*  CREATININE 1.10* 1.11* 1.48*  CALCIUM 8.8* 8.5* 9.1   GFR: Estimated Creatinine Clearance: 13.7 mL/min (A) (by C-G formula based on SCr of 1.48 mg/dL (H)). Liver Function Tests: Recent Labs  Lab 05/19/18 2000  AST 19  ALT 19  ALKPHOS 79  BILITOT 0.7  PROT 6.3*  ALBUMIN 3.1*   No results for input(s): LIPASE, AMYLASE in the last 168 hours. No results for input(s): AMMONIA in the last 168 hours. Coagulation Profile: No results for input(s): INR, PROTIME in the last 168 hours. Cardiac Enzymes: Recent Labs  Lab 05/19/18 2000  TROPONINI <0.03   BNP (last 3 results) No results for input(s): PROBNP in the last 8760 hours. HbA1C: No results for input(s): HGBA1C in the last 72 hours. CBG: No results for input(s): GLUCAP in the last 168 hours. Lipid Profile: No results for input(s): CHOL, HDL, LDLCALC, TRIG, CHOLHDL, LDLDIRECT in the last 72 hours. Thyroid Function Tests: No results for input(s): TSH, T4TOTAL, FREET4, T3FREE, THYROIDAB in the last 72 hours. Anemia Panel: No results for input(s): VITAMINB12, FOLATE, FERRITIN, TIBC, IRON, RETICCTPCT in the last 72 hours. Sepsis Labs: No results for input(s): PROCALCITON, LATICACIDVEN in the last 168 hours.  Recent Results (from the past 240 hour(s))  Gram stain     Status: None   Collection Time: 05/19/18  7:31 PM  Result Value Ref Range Status   Specimen Description PLEURAL  Final   Special Requests NONE  Final   Gram Stain   Final    WBC PRESENT, PREDOMINANTLY MONONUCLEAR  NO ORGANISMS SEEN Performed at [facility], [place] [address], [place], [place] [address]    Report Status 05/19/2018 FINAL  Final  Culture, body fluid-bottle     Status: None (Preliminary result)   Collection Time: 05/19/18  8:45 PM  Result Value Ref  Range Status   Specimen Description PLEURAL  Final   Special Requests NONE  Final   Culture   Final    NO GROWTH < 24 HOURS Performed at [facility], [place] [address], [place], [place] [address]    Report Status PENDING  Incomplete  MRSA PCR Screening     Status: None   Collection Time: 05/20/18 12:01 AM  Result Value Ref Range Status   MRSA by PCR NEGATIVE NEGATIVE Final    Comment:        The GeneXpert MRSA Assay (FDA approved for NASAL specimens only), is one component of a comprehensive MRSA colonization surveillance program. It is not intended to diagnose MRSA infection nor to guide or monitor treatment for MRSA infections. Performed at [facility], [place] [address], [place], [place] [address]   Blood culture (routine x 2)     Status: None (Preliminary result)   Collection Time: 05/20/18 12:54 AM  Result Value Ref Range Status   Specimen Description BLOOD RIGHT FOREARM  Final   Special Requests   Final    BOTTLES DRAWN AEROBIC AND ANAEROBIC Blood Culture adequate volume Performed at [facility], [place] [address], [place], [place] [address]    Culture PENDING  Incomplete   Report Status PENDING  Incomplete  Respiratory Panel by PCR     Status: None   Collection Time: 05/20/18  9:44 AM  Result Value Ref Range Status   Adenovirus NOT DETECTED NOT DETECTED Final   Coronavirus 229E NOT DETECTED NOT DETECTED Final    Comment: (NOTE) The Coronavirus on the Respiratory Panel, DOES NOT test for the novel  Coronavirus (2019 nCoV)    Coronavirus HKU1 NOT DETECTED NOT DETECTED Final   Coronavirus NL63 NOT DETECTED NOT DETECTED Final   Coronavirus OC43 NOT DETECTED NOT DETECTED Final   Metapneumovirus NOT DETECTED NOT DETECTED Final   Rhinovirus / Enterovirus NOT DETECTED NOT DETECTED Final   Influenza A NOT DETECTED NOT DETECTED Final   Influenza B NOT DETECTED NOT DETECTED Final   Parainfluenza Virus 1 NOT DETECTED NOT DETECTED Final   Parainfluenza Virus 2  NOT DETECTED NOT DETECTED Final   Parainfluenza Virus 3 NOT DETECTED NOT DETECTED Final   Parainfluenza Virus 4 NOT DETECTED NOT DETECTED Final   Respiratory Syncytial Virus NOT DETECTED NOT DETECTED Final   Bordetella pertussis NOT DETECTED NOT DETECTED Final   Chlamydophila pneumoniae NOT DETECTED NOT DETECTED Final   Mycoplasma pneumoniae NOT DETECTED NOT DETECTED Final    Comment: Performed at [facility], [address], [place], [place] [address]         Radiology Studies: Dg Chest Portable 1 View  Result Date: 05/19/2018 CLINICAL DATA:  Evaluate for post thoracentesis. EXAM: PORTABLE CHEST 1 VIEW COMPARISON:  05/19/2018 FINDINGS: Lungs are adequately inflated demonstrate moderate interval improvement in the right-sided pleural effusion as there is a small amount of residual right pleural fluid likely with associated basilar atelectasis. Moderate left pleural effusion slightly worse. Likely associated left basilar atelectasis. No pneumothorax. Prominence of the perihilar markings suggesting mild interstitial edema and less likely infection. Mild stable cardiomegaly. Remainder of the exam is unchanged. IMPRESSION: Moderate improvement  and right-sided pleural effusion with small residual right effusion likely with associated basilar atelectasis. No pneumothorax. Slight worsening moderate size left pleural effusion likely with associated basilar atelectasis. Mild cardiomegaly with suggestion of interstitial edema and less likely infection. Electronically Signed   By: Marin Olp M.D.   On: 05/19/2018 21:15   Dg Chest Portable 1 View  Result Date: 05/19/2018 CLINICAL DATA:  Shortness of breath EXAM: PORTABLE CHEST 1 VIEW COMPARISON:  05/02/2018 FINDINGS: Diffuse bilateral interstitial and patchy alveolar airspace opacities. Small bilateral pleural effusions right greater than left. No pneumothorax. Stable cardiomegaly. No acute osseous abnormality. IMPRESSION: Stable cardiomegaly with  bilateral interstitial and alveolar airspace opacities with pleural effusions. Differential considerations include pulmonary edema versus multilobar pneumonia. Electronically Signed   By: Kathreen Devoid   On: 05/19/2018 19:10        Scheduled Meds:  amiodarone  400 mg Oral Daily   apixaban  2.5 mg Oral BID   furosemide  40 mg Intravenous Q12H   ipratropium  4 puff Inhalation Once   levothyroxine  100 mcg Oral Q0600   mometasone-formoterol  2 puff Inhalation BID   sodium chloride flush  3 mL Intravenous Q12H   umeclidinium bromide  1 puff Inhalation Daily   Continuous Infusions:  sodium chloride       LOS: 1 day    Time spent: [place], MD [facility]  If 7PM-7AM, please contact night-coverage  05/21/2018, 11:26 AM

## 2018-05-21 NOTE — Progress Notes (Signed)
Patient came from [facility]. Will need to rule out COVID before the patient is able to return.   CSW will continue to follow.   Domenic Schwab, MSW, [place]

## 2018-05-21 NOTE — Progress Notes (Signed)
Call from [facility] MD  Covid rule out LOS 1 days Now on 13 LNC Results for HAVEN, PYLANT (MRN [id number]) as of 05/21/2018 11:27  Ref. Range 05/01/2018 05:45 05/02/2018 04:02 05/11/2018 00:00 05/19/2018 20:00 05/21/2018 08:15  Lymphocyte # Latest Ref Range: 0.7 - 4.0 K/uL    1.0 1.4    No results for input(s): PROCALCITON in the last 168 hours.   Plan  - recommend medical care with palliation      SIGNATURE    Dr. Brand Males, M.D., F.C.C.P,  Pulmonary and Critical Care Medicine Staff Physician, [place] Director - Interstitial Lung Disease  Program  Pulmonary [place] [facility] [place], [place], [address]  Pager: [phone], If no answer or between  15:00h - 7:00h: call [phone] Telephone: [phone]  11:27 AM 05/21/2018

## 2018-05-22 ENCOUNTER — Inpatient Hospital Stay (HOSPITAL_COMMUNITY): Payer: Medicare Other

## 2018-05-22 LAB — BASIC METABOLIC PANEL
Anion gap: 9 (ref 5–15)
BUN: 26 mg/dL — ABNORMAL HIGH (ref 8–23)
CO2: 35 mmol/L — ABNORMAL HIGH (ref 22–32)
Calcium: 8.6 mg/dL — ABNORMAL LOW (ref 8.9–10.3)
Chloride: 93 mmol/L — ABNORMAL LOW (ref 98–111)
Creatinine, Ser: 1.32 mg/dL — ABNORMAL HIGH (ref 0.44–1.00)
GFR calc Af Amer: 39 mL/min — ABNORMAL LOW (ref >60)
GFR calc non Af Amer: 33 mL/min — ABNORMAL LOW (ref >60)
Glucose, Bld: 95 mg/dL (ref 70–99)
Potassium: 3.3 mmol/L — ABNORMAL LOW (ref 3.5–5.1)
Sodium: 137 mmol/L (ref 135–145)

## 2018-05-22 LAB — NOVEL CORONAVIRUS, NAA (HOSP ORDER, SEND-OUT TO REF LAB; TAT 18-24 HRS): SARS-CoV-2, NAA: NOT DETECTED

## 2018-05-22 LAB — GLUCOSE, CAPILLARY: Glucose-Capillary: 114 mg/dL — ABNORMAL HIGH (ref 70–99)

## 2018-05-22 MED ORDER — DILTIAZEM HCL ER COATED BEADS 120 MG PO CP24
120.0000 mg | ORAL_CAPSULE | Freq: Every day | ORAL | Status: DC
  Administered 2018-05-23: 120 mg via ORAL
  Filled 2018-05-22: qty 1

## 2018-05-22 MED ORDER — SODIUM CHLORIDE 0.9 % IV BOLUS
250.0000 mL | Freq: Once | INTRAVENOUS | Status: AC
  Administered 2018-05-23: 250 mL via INTRAVENOUS

## 2018-05-22 MED ORDER — DOCUSATE SODIUM 100 MG PO CAPS
100.0000 mg | ORAL_CAPSULE | Freq: Two times a day (BID) | ORAL | Status: DC
  Administered 2018-05-22 – 2018-05-23 (×3): 100 mg via ORAL
  Filled 2018-05-22 (×3): qty 1

## 2018-05-22 NOTE — NC FL2 (Signed)
[place] MEDICAID FL2 LEVEL OF CARE SCREENING TOOL     IDENTIFICATION  Patient Name: Katherine [name] Birthdate: 1917-03-25 Sex: female Admission Date (Current Location): 05/19/2018  [facility] and [place] Number:  Herbalist and Address:  [facility] [place] [address], [place], [place] [address]      Provider Number: [id number]  Attending Physician Name and Address:  Marcell Anger*  Relative Name and Phone Number:       Current Level of Care: Hospital Recommended Level of Care: [place] Prior Approval Number:    Date Approved/Denied:   PASRR Number:    Discharge Plan: SNF    Current Diagnoses: Patient Active Problem List   Diagnosis Date Noted  . Acute respiratory failure with hypoxia and hypercapnia ([place]) 05/20/2018  . Pleural effusion due to CHF (congestive heart failure) ([place]) 05/19/2018  . Pressure injury of skin 04/28/2018  . Acute on chronic diastolic (congestive) heart failure ([place]) 04/27/2018  . Diastolic CHF ([place]) 29/56/2130  . Fatigue 04/25/2018  . Edema 04/03/2018  . Acute on chronic respiratory failure with hypoxia ([place]) 03/17/2018  . Atrial fibrillation ([place]) 03/17/2018  . Volume overload 03/17/2018  . Chronic anemia 03/17/2018  . CKD (chronic kidney disease) stage 3, GFR 30-59 ml/min (HCC) 03/17/2018  . Breast cancer ([place]) 03/16/2013  . Paget's disease and intraductal carcinoma of left breast ([place]) 03/16/2013  . Aftercare following surgery of the circulatory system, [place] 01/03/2013  . COPD (chronic obstructive pulmonary disease) with acute bronchitis ([place]) 03/16/2012  . Altered mental status 03/16/2012  . Occlusion and stenosis of carotid artery without mention of cerebral infarction 01/06/2012  . Bronchiectasis without acute exacerbation ([place]) 08/20/2011  . Atypical mycobacterial disease 08/20/2011  . Hypothyroidism 07/24/2011  . Hyperlipidemia 07/24/2011  . Hypertension 07/24/2011  . CAP  (community acquired pneumonia) 07/23/2011  . COPD (chronic obstructive pulmonary disease) ([place]) 07/23/2011  . Hemoptysis 07/23/2011    Orientation RESPIRATION BLADDER Height & Weight     Self, Time, Situation, Place  (Nasal cannula 6 liters) Continent Weight: 45.5 kg Height:  4\' 10"  (147.3 cm)  BEHAVIORAL SYMPTOMS/MOOD NEUROLOGICAL BOWEL NUTRITION STATUS  (None) (None) Continent (Heart Healthy)  AMBULATORY STATUS COMMUNICATION OF NEEDS Skin   Limited Assist Verbally PU Stage and Appropriate Care, Bruising, Other (Comment)(cracking) PU Stage 1 Dressing: (Medial midback, foam prn)                     Personal Care Assistance Level of Assistance  Bathing, Feeding, Dressing Bathing Assistance: Limited assistance Feeding assistance: Independent Dressing Assistance: Limited assistance     Functional Limitations Info  Sight, Hearing Sight Info: Adequate Hearing Info: Adequate Speech Info: Adequate    SPECIAL CARE FACTORS FREQUENCY  PT (By licensed PT), OT (By licensed OT)     PT Frequency: (5 x week) OT Frequency: (5 x week)            Contractures Contractures Info: Not present    Additional Factors Info  Code Status, Allergies Code Status Info: (DNR) Allergies Info: ( Sulfa Antibiotics, Sulfamethoxazole, Sulfur, Amoxicillin, Penicillin G, Penicillins, Tape)           Current Medications (05/22/2018):  This is the current hospital active medication list Current Facility-Administered Medications  Medication Dose Route Frequency Provider Last Rate Last Dose  . 0.9 %  sodium chloride infusion  250 mL Intravenous PRN Opyd, Ilene Qua, MD      . acetaminophen (TYLENOL) tablet 650 mg  650 mg Oral Q4H PRN Opyd, Ilene Qua, MD      . albuterol (PROVENTIL HFA;VENTOLIN HFA) 108 (90 Base) MCG/ACT inhaler 2 puff  2 puff Inhalation Q4H PRN Opyd, Ilene Qua, MD      . amiodarone (PACERONE) tablet 400 mg  400 mg Oral Daily Opyd, Ilene Qua, MD   400 mg at 05/22/18 0730  . apixaban  (ELIQUIS) tablet 2.5 mg  2.5 mg Oral BID Vianne Bulls, MD   2.5 mg at 05/22/18 0731  . docusate sodium (COLACE) capsule 100 mg  100 mg Oral BID Marcell Anger, MD      . furosemide (LASIX) injection 40 mg  40 mg Intravenous Q12H Opyd, Ilene Qua, MD   40 mg at 05/22/18 0536  . levothyroxine (SYNTHROID, LEVOTHROID) tablet 100 mcg  100 mcg Oral Q0600 Vianne Bulls, MD   100 mcg at 05/22/18 0535  . mometasone-formoterol (DULERA) 200-5 MCG/ACT inhaler 2 puff  2 puff Inhalation BID Opyd, Ilene Qua, MD   2 puff at 05/22/18 8453  . ondansetron (ZOFRAN) injection 4 mg  4 mg Intravenous Q6H PRN Opyd, Ilene Qua, MD      . sodium chloride flush (NS) 0.9 % injection 3 mL  3 mL Intravenous Q12H Opyd, Ilene Qua, MD   3 mL at 05/22/18 0742  . sodium chloride flush (NS) 0.9 % injection 3 mL  3 mL Intravenous PRN Opyd, Ilene Qua, MD      . umeclidinium bromide (INCRUSE ELLIPTA) 62.5 MCG/INH 1 puff  1 puff Inhalation Daily Opyd, Ilene Qua, MD   1 puff at 05/22/18 6468     Discharge Medications: Please see discharge summary for a list of discharge medications.  Relevant Imaging Results:  Relevant Lab Results:   Additional Information (soc sec [phone]; pending covid)  Zenon Mayo, RN

## 2018-05-22 NOTE — TOC Initial Note (Signed)
Transition of Care [facility]) - Initial/Assessment Note    Patient Details  Name: Katherine [name] MRN: [id number] Date of Birth: 1917/05/20  [facility] CM/SW Contact:    Zenon Mayo, RN Phone Number: 05/22/2018, 11:44 AM  Clinical Narrative:                 Patient is from [facility], and plans to return there at discharge.  She will have to be covide negative before she returns.  Expected Discharge Plan: Skilled Nursing Facility Barriers to Discharge: Continued Medical Work up   Patient Goals and CMS Choice Patient states their goals for this hospitalization and ongoing recovery are:: [facility] CMS Medicare.gov Compare Post Acute Care list provided to:: Other (Comment Required)(patient returning to previous snf)    Expected Discharge Plan and Services Expected Discharge Plan: [place]   Discharge Planning Services: CM Consult Post Acute Care Choice: [place] Living arrangements for the past 2 months: [place]                          Prior Living Arrangements/Services Living arrangements for the past 2 months: [place] Lives with:: Self Patient language and need for interpreter reviewed:: No Do you feel safe going back to the place where you live?: Yes          Current home services: DME(oxygen with [employer]) Criminal Activity/Legal Involvement Pertinent to Current Situation/Hospitalization: No - Comment as needed  Activities of Daily Living Home Assistive Devices/Equipment: Eyeglasses, Gilford Rile (specify type) ADL Screening (condition at time of admission) Patient's cognitive ability adequate to safely complete daily activities?: Yes Is the patient deaf or have difficulty hearing?: No Does the patient have difficulty seeing, even when wearing glasses/contacts?: No Does the patient have difficulty concentrating, remembering, or making decisions?: No Patient  able to express need for assistance with ADLs?: Yes Does the patient have difficulty dressing or bathing?: No Independently performs ADLs?: No Communication: Independent Dressing (OT): Needs assistance Is this a change from baseline?: Pre-admission baseline Grooming: Needs assistance Is this a change from baseline?: Pre-admission baseline Feeding: Independent Bathing: Needs assistance Is this a change from baseline?: Pre-admission baseline Is this a change from baseline?: Pre-admission baseline In/Out Bed: Needs assistance Is this a change from baseline?: Pre-admission baseline Walks in Home: Needs assistance Is this a change from baseline?: Pre-admission baseline Does the patient have difficulty walking or climbing stairs?: Yes Weakness of Legs: Both Weakness of Arms/Hands: Both  Permission Sought/Granted Permission sought to share information with : (facility contace representative) Permission granted to share information with : Yes, Verbal Permission Granted     Permission granted to share info w AGENCY: [facility]        Emotional Assessment Appearance:: Appears stated age Attitude/Demeanor/Rapport: Engaged Affect (typically observed): Accepting, Appropriate Orientation: : Oriented to Self, Oriented to Place, Oriented to  Time, Oriented to Situation Alcohol / Substance Use: Never Used Psych Involvement: No (comment)  Admission diagnosis:  Respiratory distress [R06.03] Acute on chronic respiratory failure with hypoxia ([place]) [J96.21] Patient Active Problem List   Diagnosis Date Noted  . Acute respiratory failure with hypoxia and hypercapnia ([place]) 05/20/2018  . Pleural effusion due to CHF (congestive heart failure) ([place]) 05/19/2018  . Pressure injury of skin 04/28/2018  . Acute on chronic diastolic (congestive) heart failure ([place]) 04/27/2018  . Diastolic CHF ([place]) 62/95/2841  . Fatigue 04/25/2018  .  Edema 04/03/2018  . Acute on chronic respiratory failure  with hypoxia ([place]) 03/17/2018  . Atrial fibrillation ([place]) 03/17/2018  . Volume overload 03/17/2018  . Chronic anemia 03/17/2018  . CKD (chronic kidney disease) stage 3, GFR 30-59 ml/min (HCC) 03/17/2018  . Breast cancer ([place]) 03/16/2013  . Paget's disease and intraductal carcinoma of left breast ([place]) 03/16/2013  . Aftercare following surgery of the circulatory system, [place] 01/03/2013  . COPD (chronic obstructive pulmonary disease) with acute bronchitis ([place]) 03/16/2012  . Altered mental status 03/16/2012  . Occlusion and stenosis of carotid artery without mention of cerebral infarction 01/06/2012  . Bronchiectasis without acute exacerbation ([place]) 08/20/2011  . Atypical mycobacterial disease 08/20/2011  . Hypothyroidism 07/24/2011  . Hyperlipidemia 07/24/2011  . Hypertension 07/24/2011  . CAP (community acquired pneumonia) 07/23/2011  . COPD (chronic obstructive pulmonary disease) ([place]) 07/23/2011  . Hemoptysis 07/23/2011   PCP:  [name], MD Pharmacy:  No Pharmacies Listed    Social Determinants of Health (SDOH) Interventions    Readmission Risk Interventions Readmission Risk Prevention Plan 05/02/2018 04/28/2018  Transportation Screening - Complete  PCP or Specialist Appt within 3-5 Days - Not Complete  Not Complete comments - Will check closer to discharge.  [place] or Home Care Consult - Not Complete  Social Work Consult for [place] Planning/Counseling - Complete  Palliative Care Screening - Not Applicable  Medication Review (RN Care Manager) - Referral to Pharmacy  [place]RI or [place] Not Complete -  [place] or Home Care Consult Pt Refusal Comments Going to SNF -  SW Recovery Care/Counseling Consult Complete -  Palliative Care Screening Not Applicable -  Skilled Nursing Facility Complete -  Some recent data might be hidden

## 2018-05-22 NOTE — Progress Notes (Signed)
PROGRESS NOTE    AHTZIRY SAATHOFF  WRU:[id number] DOB: 1917/11/30 DOA: 05/19/2018 PCP: [name], MD   Brief Narrative:  Per admitting provider: Azizi Bally Smithis a 85 y.o.femalewith medical history significant forchronic diastolic CHF, atrial fibrillation on Eliquis, COPD, hypothyroidism, chronic kidney disease stage III, and chronic 3 L/min supplemental oxygen requirement, now presenting to the emergency department for evaluation of shortness of breath. Patient was admitted to the hospital last month and discharged on 05/02/2018 after diuresis, treatment of COPD exacerbation, and right-sided thoracentesis. She had been stable at her nursing facility on 3 L/min of supplemental oxygen since time of discharge until she recently experienced rapidly progressive worsening in her dyspnea. She has been coughing, but has not noted any fevers or chills. She also denies chest pain. She reports bilateral lower extremity swelling that has been going on for some time. EMS was called out to her nursing facility today, she was found to be saturating in the low 60s, and was transported to the hospital on nonrebreather.  Since admission to the hospital patient is still requiring significant amounts of oxygen currently on 13 L nonrebreather although was not in distress clinically when being evaluated.  Case was discussed with critical care with recommendation to continue current level of care, recommended against intubation. Patient is DNR.  Over the last 24 hours patient is O2 requirements have decreased considerably to between 6 to 8 L.  Clinically patient reports she started to feel better.   Assessment & Plan:   Principal Problem:   Acute on chronic respiratory failure with hypoxia (HCC) Active Problems:   COPD (chronic obstructive pulmonary disease) (HCC)   Hypothyroidism   Atrial fibrillation (HCC)   CKD (chronic kidney disease) stage 3, GFR 30-59 ml/min (HCC)   Acute on chronic diastolic  (congestive) heart failure (HCC)   Pleural effusion due to CHF (congestive heart failure) (HCC)   Acute respiratory failure with hypoxia and hypercapnia ([place])   1.Acute on chronic hypoxic respiratory failure -As noted by admitting provider, presentedfrom nursing home with SOB, requiring 15 Lpm of supplemental O2 in order to keep sat in low 90's -She denied fevers or travel, but has been coughing, coming from nursing home, and was recently hospitalized (flu pcr neg during admission) -She was recently discharged with 3 Lpm,  -Treat CHF as below, FOLLOW swab for COVID testing-we will need results before being able to transfer back to her skilled nursing facility, continue infection-prevention measureswith droplet and contact precautions  2.Acute on chronic diastolic CHF -Presents with worsening SOB, found to be saturating 60's by EMS -There is peripheral edema, elevated BNP, pleural effusions, and interstitial and alveolar opacity on CXR that most likely reflects edema -She was given 40 mg IV Lasix and had right thoracentesis in EDbut still required 10 L after thoracentesis -Recent echo with preserved EF -Continue diuresis with Lasix 40 mg IV q12h, follow daily wt and I/O's - cxr 4/6 shows:  Small bilateral pleural effusions with bilateral interstitial and              alveolar airspace opacities. Differential considerations include              pulmonary edema versus multilobar pneumonia  3.Pleural effusions -Right thoracentesis performed last month and again in ED 05/19/18 with ~900 cc off; she also has a moderate left-sided effusion noted on CXR -There was concern for possible malignancy on recent imaging; pleural fluid cytology negative last month -Most likely secondary to CHF -Continue diuresis, repeat  cxr as above  4.COPD -No wheezing on admission -Continue ICS/LABA, Spiriva, and as-needed albuterol MDI  5.Atrial fibrillation -In  atrial fibrillation with intermittent breakthru RVR - Cardiology considering DCCV later this month -CHADS-VASc is at least 10 (age x2, gender, CHF) -Continue Eliquis, continue amiodarone (recently started, 400 mg qD until 05/30/18, then 200 mg qD)  6.Hypothyroidism -Synthroid recently increased to 100 mcg daily, will continue  7.CKD stage III -SCr is 1.1 on admission, consistent with her apparent baseline -Renally-dose medicationsand recheck labs tomorrow morning  PPE: HAIR NET, CAPR, GOWN, GLOVES  DVT prophylaxis: [id number]  Code Status: DNR    Code Status Orders  (From admission, onward)         Start     Ordered   05/19/18 2151  Do not attempt resuscitation (DNR)  Continuous    Question Answer Comment  In the event of cardiac or respiratory ARREST Do not call a "code blue"   In the event of cardiac or respiratory ARREST Do not perform Intubation, CPR, defibrillation or ACLS   In the event of cardiac or respiratory ARREST Use medication by any route, position, wound care, and other measures to relive pain and suffering. May use oxygen, suction and manual treatment of airway obstruction as needed for comfort.      05/19/18 2153        Code Status History    Date Active Date Inactive Code Status Order ID Comments User Context   05/19/2018 1821 05/19/2018 2153 DNR [id number]  Merrily Pew, MD ED   04/27/2018 1445 05/02/2018 2047 DNR [id number]  Karmen Bongo, MD ED   03/17/2018 1912 03/21/2018 1710 DNR [id number]  Shela Leff, MD ED   03/16/2012 1431 03/20/2012 1717 DNR [id number]  Wilfred Curtis, RN Inpatient   07/23/2011 2340 07/27/2011 1831 DNR [id number]  Tresa Moore, RN Inpatient    Advance Directive Documentation     Most Recent Value  Type of Advance Directive  Out of facility DNR (pink MOST or yellow form), Healthcare Power of Attorney  Pre-existing out of facility DNR order (yellow form or pink MOST form)  -  "MOST" Form in Place?  -      Family Communication: With her niece Ms. Darnell Level Disposition Plan:   Continue as inpatient for acute hypoxic respiratory failure requiring aggressive high flow supplemental oxygen complicated by CHF exacerbation, and possible underlying coronavirus infection. Patient require frequent monitoring, electrolyte monitoring, IV diuresis, respiratory support and and supportive care. Consults called: None Admission status: Inpatient   Consultants:   Case initially discussed with PCCM  Procedures:  Dg Chest 1 View  Result Date: 04/29/2018 CLINICAL DATA:  Post right-sided thoracentesis. EXAM: CHEST  1 VIEW COMPARISON:  04/28/2018; chest CT-04/29/2018 FINDINGS: Grossly unchanged enlarged cardiac silhouette and mediastinal contours with atherosclerotic plaque within thoracic aorta. There is persistent rightward deviation the tracheal air, the level of the aortic arch. There is persistent thickening the right paratracheal stripe presumably to prominent vasculature. Interval reduction in persistent small right-sided pleural effusion post thoracentesis. No pneumothorax. Improved aeration the right lung base with persistent right basilar opacities. Unchanged small to moderate size left-sided effusion and associated left basilar heterogeneous/consolidative opacities. Pulmonary vasculature remains indistinct with cephalization of flow. Moderate scoliotic curvature of the thoracolumbar spine, incompletely evaluated. No definite acute osseous abnormalities. IMPRESSION: 1. Interval reduction in persistent small right-sided effusion post thoracentesis. No pneumothorax. 2. Improved aeration the right lung base with persistent right basilar opacities, likely atelectasis. 3. Similar findings of pulmonary edema with  small to moderate size left-sided effusion associated left basilar opacities, likely atelectasis. Electronically Signed   By: Sandi Mariscal M.D.   On: 04/29/2018 16:27   Dg Chest 2 View  Result Date: 05/02/2018  CLINICAL DATA:  Follow-up recent pneumonia. EXAM: CHEST - 2 VIEW COMPARISON:  05/01/2018 and 03/17/2018 as well as recent chest CT 04/29/2018 FINDINGS: Lungs are adequately inflated demonstrate stable patchy density over the right upper lobe known to represent area of scarring with associated suspicious 1.4 cm nodule by recent CT. Stable small to moderate right pleural effusion and stable small left pleural effusion. Likely associated bibasilar atelectasis. Infection in the lung bases is possible. Cardiomediastinal silhouette and remainder of the exam is unchanged. IMPRESSION: Stable patchy density in the right upper lobe known to represent scarring with associated suspicious 1.4 cm nodule by recent chest CT. Stable bilateral pleural effusions likely with associated basilar atelectasis. Infection in the lung bases is possible. Electronically Signed   By: Marin Olp M.D.   On: 05/02/2018 09:07   Dg Chest 2 View  Result Date: 05/01/2018 CLINICAL DATA:  Hypoxia. EXAM: CHEST - 2 VIEW COMPARISON:  04/29/2018 FINDINGS: Improved aeration in the left lower chest is suggestive for decreasing pleural fluid. Cardiac silhouette remains enlarged. Right pleural effusion may slightly enlarged. Again noted are patchy densities in the right mid and upper lung. Severe kyphotic deformity near the thoracolumbar spine with probable compression deformity. Difficult to evaluate for interval change in the spine. IMPRESSION: Slightly increased patchy densities in the right lung are worrisome for pneumonia. Slightly increased right pleural fluid. Improved aeration in the left lower chest may represent decreased left pleural effusion. Electronically Signed   By: Markus Daft M.D.   On: 05/01/2018 08:21   Ct Chest Wo Contrast  Result Date: 04/29/2018 CLINICAL DATA:  Bilateral pleural effusions on chest radiograph EXAM: CT CHEST WITHOUT CONTRAST TECHNIQUE: Multidetector CT imaging of the chest was performed following the standard  protocol without IV contrast. COMPARISON:  Chest radiograph dated 04/28/2018 FINDINGS: Cardiovascular: Mild cardiomegaly with left atrial enlargement. No evidence of thoracic aortic aneurysm. Atherosclerotic calcifications of the aortic arch. Three vessel coronary atherosclerosis. Mediastinum/Nodes: Mediastinal lymphadenopathy, including a dominant 2.5 cm short axis node in the low right paratracheal region (series 3/image 60) and 1.6 cm short axis subcarinal node (series 3/image 69). Visualized thyroid is notable for an 8 mm calcified right thyroid nodule. Lungs/Pleura: 13 x 14 mm central right upper lobe nodule (series 4/image 87), suspicious for primary bronchogenic neoplasm. Additional linear/platelike opacities in the right upper lobe, nonspecific. Right lower lobe opacity favors compressive atelectasis (series 3/image 86). Additional atelectasis in the right middle lobe and left lower lobe. Moderate right and small left pleural effusions. Scattered interstitial opacities with interlobular septal thickening in the bilateral upper lobes, suggesting very mild edema. Mild to moderate centrilobular emphysematous changes, upper lobe predominant. No pneumothorax. Upper Abdomen: Visualized upper abdomen is notable for vascular calcifications. Musculoskeletal: Degenerative changes of the visualized thoracolumbar spine. IMPRESSION: 14 mm right upper lobe nodule, suspicious for primary bronchogenic neoplasm. Associated mediastinal lymphadenopathy, including a dominant 2.5 cm short axis node in the low right paratracheal region. Cardiomegaly with possible mild interstitial edema. Moderate right and small left pleural effusions. Aortic Atherosclerosis (ICD10-I70.0) and Emphysema (ICD10-J43.9). Electronically Signed   By: Julian Hy M.D.   On: 04/29/2018 11:09   Dg Chest Port 1 View  Result Date: 05/22/2018 CLINICAL DATA:  Shortness of breath EXAM: PORTABLE CHEST 1 VIEW COMPARISON:  05/19/2018 FINDINGS: Small  bilateral pleural effusions. Bilateral interstitial and alveolar airspace opacities worse in the right lung. No pneumothorax. Stable cardiomediastinal silhouette. No acute osseous abnormality. IMPRESSION: Small bilateral pleural effusions with bilateral interstitial and alveolar airspace opacities. Differential considerations include pulmonary edema versus multilobar pneumonia. Electronically Signed   By: Kathreen Devoid   On: 05/22/2018 08:05   Dg Chest Portable 1 View  Result Date: 05/19/2018 CLINICAL DATA:  Evaluate for post thoracentesis. EXAM: PORTABLE CHEST 1 VIEW COMPARISON:  05/19/2018 FINDINGS: Lungs are adequately inflated demonstrate moderate interval improvement in the right-sided pleural effusion as there is a small amount of residual right pleural fluid likely with associated basilar atelectasis. Moderate left pleural effusion slightly worse. Likely associated left basilar atelectasis. No pneumothorax. Prominence of the perihilar markings suggesting mild interstitial edema and less likely infection. Mild stable cardiomegaly. Remainder of the exam is unchanged. IMPRESSION: Moderate improvement and right-sided pleural effusion with small residual right effusion likely with associated basilar atelectasis. No pneumothorax. Slight worsening moderate size left pleural effusion likely with associated basilar atelectasis. Mild cardiomegaly with suggestion of interstitial edema and less likely infection. Electronically Signed   By: Marin Olp M.D.   On: 05/19/2018 21:15   Dg Chest Portable 1 View  Result Date: 05/19/2018 CLINICAL DATA:  Shortness of breath EXAM: PORTABLE CHEST 1 VIEW COMPARISON:  05/02/2018 FINDINGS: Diffuse bilateral interstitial and patchy alveolar airspace opacities. Small bilateral pleural effusions right greater than left. No pneumothorax. Stable cardiomegaly. No acute osseous abnormality. IMPRESSION: Stable cardiomegaly with bilateral interstitial and alveolar airspace opacities  with pleural effusions. Differential considerations include pulmonary edema versus multilobar pneumonia. Electronically Signed   By: Kathreen Devoid   On: 05/19/2018 19:10   Dg Chest Port 1 View  Result Date: 04/28/2018 CLINICAL DATA:  Shortness of breath. EXAM: PORTABLE CHEST 1 VIEW COMPARISON:  04/27/2018 and prior exams FINDINGS: Cardiomegaly, pulmonary vascular congestion, interstitial pulmonary edema, bilateral pleural effusions, RIGHT greater than LEFT, and bilateral LOWER lung atelectasis again noted. No pneumothorax. No significant change from the prior study. IMPRESSION: Unchanged appearance of the chest with pulmonary edema, bilateral pleural effusions, RIGHT greater than LEFT, and bibasilar atelectasis. Electronically Signed   By: Margarette Canada M.D.   On: 04/28/2018 09:54   Dg Chest Portable 1 View  Result Date: 04/27/2018 CLINICAL DATA:  Shortness of breath. EXAM: PORTABLE CHEST 1 VIEW COMPARISON:  Radiograph March 17, 2018. FINDINGS: Stable cardiomegaly. Atherosclerosis of thoracic aorta is noted. No pneumothorax is noted. Interval development of mild to moderate bilateral pleural effusions are noted. Bilateral interstitial densities are noted which may represent edema. Bony thorax is unremarkable. IMPRESSION: Stable cardiomegaly with probable bilateral pulmonary edema and interval development of mild to moderate bilateral pleural effusions. Electronically Signed   By: Marijo Conception, M.D.   On: 04/27/2018 13:35   US Thoracentesis Asp Pleural Space W/img Guide  Result Date: 04/30/2018 INDICATION: Patient with history of acute on chronic diastolic HF, dyspnea, and right pleural effusion. Request made for diagnostic and therapeutic right thoracentesis. EXAM: ULTRASOUND GUIDED DIAGNOSTIC AND THERAPEUTIC RIGHT THORACENTESIS MEDICATIONS: 8 mL 1% lidocaine COMPLICATIONS: None immediate. PROCEDURE: An ultrasound guided thoracentesis was thoroughly discussed with the patient and questions answered.  The benefits, risks, alternatives and complications were also discussed. The patient understands and wishes to proceed with the procedure. Written consent was obtained. Ultrasound was performed to localize and mark an adequate pocket of fluid in the right chest. The area was then prepped and draped in the normal sterile fashion. 1% Lidocaine was used  for local anesthesia. Under ultrasound guidance a 6 Fr Safe-T-Centesis catheter was introduced. Thoracentesis was performed. The catheter was removed and a dressing applied. FINDINGS: A total of approximately 1.1 L of hazy amber fluid was removed. Samples were sent to the laboratory as requested by the clinical team. IMPRESSION: Successful ultrasound guided right thoracentesis yielding 1.1 L of pleural fluid. Read by: Earley Abide, PA-C Electronically Signed   By: Sandi Mariscal M.D.   On: 04/29/2018 16:31     Antimicrobials:   No current antibiotics x-ray more consistent with edema, patient afebrile, normotensive, respiratory status improving with diuresis   Subjective: Patient doing well today.  Weaning oxygen down to 6 L from 13 high flow, subjectively does not feel short of breath.  Objective: Vitals:   05/22/18 0500 05/22/18 0723 05/22/18 0724 05/22/18 0747  BP:    134/76  Pulse:    95  Resp:    (!) 22  Temp:    97.8 F (36.6 C)  TempSrc:    Oral  SpO2:  100% 100% 92%  Weight: 45.5 kg     Height:        Intake/Output Summary (Last 24 hours) at 05/22/2018 1204 Last data filed at 05/22/2018 1100 Gross per 24 hour  Intake 70 ml  Output 1202 ml  Net -1132 ml   Filed Weights   05/19/18 2324 05/20/18 0500 05/22/18 0500  Weight: 46.7 kg 46.7 kg 45.5 kg    Examination:  General exam: Appears calm and comfortable  Respiratory system: Clear to auscultation. Respiratory effort normal.  Mild rales Cardiovascular system: S1 & S2 heard, RRR. No JVD, murmurs, rubs, gallops or clicks. No pedal edema. Gastrointestinal system: Abdomen is  nondistended, soft and nontender. No organomegaly or masses felt. Normal bowel sounds heard. Central nervous system: Alert and oriented. No focal neurological deficits. Extremities: Symmetric 5 x 5 power. Skin: No rashes, lesions or ulcers Psychiatry: Judgement and insight appear normal. Mood & affect appropriate.     Data Reviewed: I have personally reviewed following labs and imaging studies  CBC: Recent Labs  Lab 05/19/18 2000 05/21/18 0815  WBC 10.0 12.0*  NEUTROABS 7.9* 9.1*  HGB 11.2* 10.4*  HCT 35.8* 31.9*  MCV 94.7 94.4  PLT 248 956   Basic Metabolic Panel: Recent Labs  Lab 05/19/18 2000 05/20/18 0054 05/21/18 0815 05/22/18 0623  NA 134* 134* 138 137  K 3.6 3.5 3.8 3.3*  CL 95* 94* 95* 93*  CO2 26 26 33* 35*  GLUCOSE 117* 157* 96 95  BUN 18 17 26* 26*  CREATININE 1.10* 1.11* 1.48* 1.32*  CALCIUM 8.8* 8.5* 9.1 8.6*   GFR: Estimated Creatinine Clearance: 15.4 mL/min (A) (by C-G formula based on SCr of 1.32 mg/dL (H)). Liver Function Tests: Recent Labs  Lab 05/19/18 2000  AST 19  ALT 19  ALKPHOS 79  BILITOT 0.7  PROT 6.3*  ALBUMIN 3.1*   No results for input(s): LIPASE, AMYLASE in the last 168 hours. No results for input(s): AMMONIA in the last 168 hours. Coagulation Profile: No results for input(s): INR, PROTIME in the last 168 hours. Cardiac Enzymes: Recent Labs  Lab 05/19/18 2000  TROPONINI <0.03   BNP (last 3 results) No results for input(s): PROBNP in the last 8760 hours. HbA1C: No results for input(s): HGBA1C in the last 72 hours. CBG: No results for input(s): GLUCAP in the last 168 hours. Lipid Profile: No results for input(s): CHOL, HDL, LDLCALC, TRIG, CHOLHDL, LDLDIRECT in the last 72 hours. Thyroid  Function Tests: No results for input(s): TSH, T4TOTAL, FREET4, T3FREE, THYROIDAB in the last 72 hours. Anemia Panel: No results for input(s): VITAMINB12, FOLATE, FERRITIN, TIBC, IRON, RETICCTPCT in the last 72 hours. Sepsis Labs: No  results for input(s): PROCALCITON, LATICACIDVEN in the last 168 hours.  Recent Results (from the past 240 hour(s))  Gram stain     Status: None   Collection Time: 05/19/18  7:31 PM  Result Value Ref Range Status   Specimen Description PLEURAL  Final   Special Requests NONE  Final   Gram Stain   Final    WBC PRESENT, PREDOMINANTLY MONONUCLEAR NO ORGANISMS SEEN Performed at [facility], [address], [place], [place] [address]    Report Status 05/19/2018 FINAL  Final  Culture, body fluid-bottle     Status: None (Preliminary result)   Collection Time: 05/19/18  8:45 PM  Result Value Ref Range Status   Specimen Description PLEURAL  Final   Special Requests NONE  Final   Culture   Final    NO GROWTH 3 DAYS Performed at [facility] [address] [place], [place] [address]    Report Status PENDING  Incomplete  MRSA PCR Screening     Status: None   Collection Time: 05/20/18 12:01 AM  Result Value Ref Range Status   MRSA by PCR NEGATIVE NEGATIVE Final    Comment:        The GeneXpert MRSA Assay (FDA approved for NASAL specimens only), is one component of a comprehensive MRSA colonization surveillance program. It is not intended to diagnose MRSA infection nor to guide or monitor treatment for MRSA infections. Performed at [facility], [place] [address], [place], [place] [address]   Blood culture (routine x 2)     Status: None (Preliminary result)   Collection Time: 05/20/18 12:54 AM  Result Value Ref Range Status   Specimen Description BLOOD RIGHT FOREARM  Final   Special Requests   Final    BOTTLES DRAWN AEROBIC AND ANAEROBIC Blood Culture adequate volume   Culture   Final    NO GROWTH 2 DAYS Performed at [facility], [place] [address], [place], [place] [address]    Report Status PENDING  Incomplete  Blood culture (routine x 2)     Status: None (Preliminary result)   Collection Time: 05/20/18 12:54 AM  Result Value Ref Range Status   Specimen  Description BLOOD LEFT ANTECUBITAL  Final   Special Requests   Final    BOTTLES DRAWN AEROBIC AND ANAEROBIC Blood Culture adequate volume   Culture   Final    NO GROWTH 2 DAYS Performed at [facility], [place] [address], [place],  [address]    Report Status PENDING  Incomplete  Respiratory Panel by PCR     Status: None   Collection Time: 05/20/18  9:44 AM  Result Value Ref Range Status   Adenovirus NOT DETECTED NOT DETECTED Final   Coronavirus 229E NOT DETECTED NOT DETECTED Final    Comment: (NOTE) The Coronavirus on the Respiratory Panel, DOES NOT test for the novel  Coronavirus (2019 nCoV)    Coronavirus HKU1 NOT DETECTED NOT DETECTED Final   Coronavirus NL63 NOT DETECTED NOT DETECTED Final   Coronavirus OC43 NOT DETECTED NOT DETECTED Final   Metapneumovirus NOT DETECTED NOT DETECTED Final   Rhinovirus / Enterovirus NOT DETECTED NOT DETECTED Final   Influenza A NOT DETECTED NOT DETECTED Final   Influenza B NOT  DETECTED NOT DETECTED Final   Parainfluenza Virus 1 NOT DETECTED NOT DETECTED Final   Parainfluenza Virus 2 NOT DETECTED NOT DETECTED Final   Parainfluenza Virus 3 NOT DETECTED NOT DETECTED Final   Parainfluenza Virus 4 NOT DETECTED NOT DETECTED Final   Respiratory Syncytial Virus NOT DETECTED NOT DETECTED Final   Bordetella pertussis NOT DETECTED NOT DETECTED Final   Chlamydophila pneumoniae NOT DETECTED NOT DETECTED Final   Mycoplasma pneumoniae NOT DETECTED NOT DETECTED Final    Comment: Performed at [facility], [place] [address], [place], [place] [address]         Radiology Studies: Dg Chest Port 1 View  Result Date: 05/22/2018 CLINICAL DATA:  Shortness of breath EXAM: PORTABLE CHEST 1 VIEW COMPARISON:  05/19/2018 FINDINGS: Small bilateral pleural effusions. Bilateral interstitial and alveolar airspace opacities worse in the right lung. No pneumothorax. Stable cardiomediastinal silhouette. No acute osseous abnormality. IMPRESSION: Small bilateral  pleural effusions with bilateral interstitial and alveolar airspace opacities. Differential considerations include pulmonary edema versus multilobar pneumonia. Electronically Signed   By: Kathreen Devoid   On: 05/22/2018 08:05        Scheduled Meds: . amiodarone  400 mg Oral Daily  . apixaban  2.5 mg Oral BID  . furosemide  40 mg Intravenous Q12H  . levothyroxine  100 mcg Oral Q0600  . mometasone-formoterol  2 puff Inhalation BID  . sodium chloride flush  3 mL Intravenous Q12H  . umeclidinium bromide  1 puff Inhalation Daily   Continuous Infusions: . sodium chloride       LOS: 2 days    Time spent: 35 min    Nicolette Bang, MD [facility]  If 7PM-7AM, please contact night-coverage  05/22/2018, 12:04 PM

## 2018-05-22 NOTE — Progress Notes (Signed)
Daily Nursing Note  Received report from [place], [place]. Evaluated patient at bedside who endorsed feeling her breathing was better today. Per discussion with patient our goal for the day was to wean oxygen and get out of bed. Patient observed to be on 15L non-rebreather FM. Took off and placed on 6LPM [place] in AM then further weaned to 3LPM in afternoon, maintaining sats of 92-95%. Gotten OOB to chair with 1 person assistance. Received a call from the lab to clarify fluid from 4/3 which was pleural fluid from thoracentesis. Patient had a hard time passing a stool, noted to be straining w/ some red on stool. Notable external hemorrhoids --> requested stool softener.  Imaging: CXR (05/22/2018):  Small bilateral pleural effusions with bilateral interstitial and alveolar airspace opacities. Differential considerations include pulmonary edema versus multilobar pneumonia.  Pending Test: Novel Coronavirus  Dispo: [place]

## 2018-05-22 NOTE — Discharge Instructions (Signed)

## 2018-05-23 LAB — BASIC METABOLIC PANEL
Anion gap: 10 (ref 5–15)
BUN: 25 mg/dL — ABNORMAL HIGH (ref 8–23)
CO2: 33 mmol/L — ABNORMAL HIGH (ref 22–32)
Calcium: 8.5 mg/dL — ABNORMAL LOW (ref 8.9–10.3)
Chloride: 93 mmol/L — ABNORMAL LOW (ref 98–111)
Creatinine, Ser: 1.17 mg/dL — ABNORMAL HIGH (ref 0.44–1.00)
GFR calc Af Amer: 45 mL/min — ABNORMAL LOW (ref >60)
GFR calc non Af Amer: 39 mL/min — ABNORMAL LOW (ref >60)
Glucose, Bld: 94 mg/dL (ref 70–99)
Potassium: 3 mmol/L — ABNORMAL LOW (ref 3.5–5.1)
Sodium: 136 mmol/L (ref 135–145)

## 2018-05-23 MED ORDER — POTASSIUM CHLORIDE CRYS ER 20 MEQ PO TBCR
40.0000 meq | EXTENDED_RELEASE_TABLET | Freq: Once | ORAL | Status: AC
  Administered 2018-05-23: 40 meq via ORAL
  Filled 2018-05-23: qty 2

## 2018-05-23 NOTE — Consult Note (Signed)
   [facility] CM Inpatient Consult   05/23/2018  Katherine [name] [birth year] [id number]  Patient chart reviewed for extreme high risk score for unplanned readmissions and a less than 30 day hospitalizations.  MD chart review of progress per Dr. Wyonia Hough:  Katherine [name] a 85 y.o.femalewith medical history significant forchronic diastolic CHF, atrial fibrillation on Eliquis, COPD, hypothyroidism, chronic kidney disease stage III, and chronic 3 L/min supplemental oxygen requirement, now presenting to the emergency department for evaluation of shortness of breath. Patient was admitted to the hospital last month and discharged on 05/02/2018 after diuresis, treatment of COPD exacerbation, and right-sided thoracentesis. She had been stable at her nursing facility on 3 L/min of supplemental oxygen since time of discharge until she recently experienced rapidly progressive worsening in her dyspnea. She has been coughing, but has not noted any fevers or chills. She also denies chest pain. She reports bilateral lower extremity swelling that has been going on for some time. EMS was called out to her nursing facility today, she was found to be saturating in the low 60s, and was transported to the hospital on nonrebreather.  Patient is from [place] facility. Her current disposition is to return to skilled nursing facility.  No current [facility] Care Management needs noted for any community barriers for follow up.   Please place a [facility] Care Management consult or for questions contact:   Natividad Brood, RN BSN [facility] Liaison  [phone] business mobile phone Toll free office [phone]

## 2018-05-23 NOTE — Progress Notes (Signed)
Pt informed she will be transferring to a new bed and room on [address]. Questions answered. Pt also informed her Covid 19 test came back negative

## 2018-05-23 NOTE — Progress Notes (Addendum)
Report given to SNF RN. Waiting for PTAR

## 2018-05-23 NOTE — TOC Transition Note (Signed)
Transition of Care [facility]) - CM/SW Discharge Note   Patient Details  Name: Katherine [name] MRN: [id number] Date of Birth: December 20, 1917  Transition of Care [facility]) CM/SW Contact:  Sherrilyn Rist Phone Number:  [phone] 05/23/2018, 12:12 PM   Clinical Narrative:    [facility], disposition is SNF at discharge.   Final next level of care: Skilled Nursing Facility Barriers to Discharge: Barriers Resolved   Patient Goals and CMS Choice Patient states their goals for this hospitalization and ongoing recovery are:: [facility] CMS Medicare.gov Compare Post Acute Care list provided to:: Other (Comment Required)(patient returning to previous snf)    Discharge Placement              Patient chooses bed at: [facility] Patient to be transferred to facility by: [place] Name of family member notified: She said there was no need to call her niece. Patient and family notified of of transfer: 05/23/18  Discharge Plan and Services   Discharge Planning Services: CM Consult Post Acute Care Choice: [place]          DME Arranged: N/A DME Agency: NA HH Arranged: NA HH Agency: NA   Social Determinants of Health (SDOH) Interventions     Readmission Risk Interventions Readmission Risk Prevention Plan 05/22/2018 05/02/2018 04/28/2018  Transportation Screening Complete - Complete  PCP or Specialist Appt within 3-5 Days - - Not Complete  Not Complete comments - - Will check closer to discharge.  [place] or Home Care Consult - - Not Complete  Social Work Consult for [place] Planning/Counseling - - Complete  Palliative Care Screening - - Not Applicable  Medication Review (RN Care Manager) - - Referral to Pharmacy  PCP or Specialist appointment within 3-5 days of discharge Not Complete - -  [place]RI or [place] Not Complete Not Complete -  HRI or Home Care Consult Pt Refusal Comments (No Data) Going to SNF -  SW Recovery  Care/Counseling Consult Complete Complete -  Palliative Care Screening Not Applicable Not Applicable -  Skilled Nursing Facility Complete Complete -  Some recent data might be hidden

## 2018-05-23 NOTE — Discharge Summary (Signed)
Physician Discharge Summary  Katherine [name]:[id number] DOB: Jan 02, 1918 DOA: 05/19/2018  PCP: Katherine Dad, MD  Admit date: 05/19/2018 Discharge date: 05/23/2018  Admitted From: Inpatient Disposition: SNF  Recommendations for Outpatient Follow-up:  1. Follow up with PCP in 1-2 weeks 2. Please obtain BMP/CBC in one week 3. Please follow up on the following pending results:  Home Health:No Equipment/Devices: None Discharge Condition:Stable CODE STATUS:DNR Diet recommendation: Regular healthy diet  Brief/Interim Summary: Per admitting provider: Tabytha Gradillas Smithis a 85 y.o.femalewith medical history significant forchronic diastolic CHF, atrial fibrillation on Eliquis, COPD, hypothyroidism, chronic kidney disease stage III, and chronic 3 L/min supplemental oxygen requirement, now presenting to the emergency department for evaluation of shortness of breath. Patient was admitted to the hospital last month and discharged on 05/02/2018 after diuresis, treatment of COPD exacerbation, and right-sided thoracentesis. She had been stable at her nursing facility on 3 L/min of supplemental oxygen since time of discharge until she recently experienced rapidly progressive worsening in her dyspnea. She has been coughing, but has not noted any fevers or chills. She also denies chest pain. She reports bilateral lower extremity swelling that has been going on for some time. EMS was called out to her nursing facility today, she was found to be saturating in the low 60s, and was transported to the hospital on nonrebreather.  Hospital course: While in the hospital patient required significant amounts of oxygen up to 13 L nonrebreather.  She was slowly titrated  with diuresis and is currently on her home dose of oxygen of 2 to 3 L she was treated as follows in the hospital:  Acute on chronic hypoxic respiratory failure Acute on chronic diastolic heart failure exacerbation Pleural effusions COPD chronic not  in exacerbation Atrial fibrillation Hypothyroidism CKD stage III  .  Respiratory failure  was secondary to volume overload.  She was treated for CHF exacerbation for chronic diastolic heart failure with acute worsening.  She was diuresed appropriately and had a right thoracentesis done with removal of 900 cc of fluid.  She had COVID-19 testing done which was negative.  With continued diuresis during hospital stay patient returned to her baseline as above.  She is currently on baseline oxygen at 2 to 3 L.  Repeat chest x-ray showed small bilateral pleural effusions not requiring any further thoracentesis and mild edema.  Patient should be fluid restricted at receiving facility with close monitoring of weights and input and output.  Will defer to receiving physician whether to initiate Lasix treatment. Patient is also noted to have atrial fibrillation with intermittent breakthrough RVR.  She currently is on amiodarone which is to be 40 mg p.o. daily until April 14 and then 200 mg daily after that.  Patient will also continue her Synthroid for hypothyroidism and will require continued monitoring of her renal function for chronic kidney disease stage III.  Patient stable for discharge back to a skilled nursing facility today.  Discharge Diagnoses:  Principal Problem:   Acute on chronic respiratory failure with hypoxia (HCC) Active Problems:   COPD (chronic obstructive pulmonary disease) (HCC)   Hypothyroidism   Atrial fibrillation (HCC)   CKD (chronic kidney disease) stage 3, GFR 30-59 ml/min (HCC)   Acute on chronic diastolic (congestive) heart failure (HCC)   Pleural effusion due to CHF (congestive heart failure) ([place])   Acute respiratory failure with hypoxia and hypercapnia [facility])    Discharge Instructions  Discharge Instructions    Call MD for:   Complete by:  As  directed    Any acute change in medical condition   Diet - low sodium heart healthy   Complete by:  As directed    Increase  activity slowly   Complete by:  As directed      Allergies as of 05/23/2018      Reactions   Sulfa Antibiotics Shortness Of Breath, Palpitations   Sulfamethoxazole Shortness Of Breath, Palpitations   Sulfur Shortness Of Breath, Palpitations   "Allergic," per MAR (although I believe they might've meant "Sulfa"??)   Amoxicillin Rash   Penicillin G Rash   Did it involve swelling of the face/tongue/throat, SOB, or low BP? Yes Did it involve sudden or severe rash/hives, skin peeling, or any reaction on the inside of your mouth or nose? Unk Did you need to seek medical attention at a hospital or doctor's office? Unk When did it last happen? "it was a long time ago" If all above answers are "NO", may proceed with cephalosporin use.   Penicillins Rash   Did it involve swelling of the face/tongue/throat, SOB, or low BP? Yes Did it involve sudden or severe rash/hives, skin peeling, or any reaction on the inside of your mouth or nose? Unk Did you need to seek medical attention at a hospital or doctor's office? Unk When did it last happen? "it was a long time ago" If all above answers are "NO", may proceed with cephalosporin use.   Tape Other (See Comments)   SKIN IS VERY FRAGILE- PLEASE USE AN ALTERNATIVE TO TAPE, AS THE SKIN TEARS EASILY!!      Medication List    TAKE these medications   amiodarone 400 MG tablet Commonly known as:  PACERONE Take 1 tablet (400 mg total) by mouth daily for 27 days. (decrease to 200 mg daily on April 14th)   apixaban 2.5 MG Tabs tablet Commonly known as:  ELIQUIS Take 1 tablet (2.5 mg total) by mouth 2 (two) times daily.   diltiazem 120 MG 24 hr capsule Commonly known as:  CARDIZEM CD Take 1 capsule (120 mg total) by mouth daily.   ergocalciferol 1.25 MG (50000 UT) capsule Commonly known as:  VITAMIN D2 Take 1,250 Units by mouth once a week. Once a day on Sunday   fluticasone 50 MCG/ACT nasal spray Commonly known as:  FLONASE Place 2 sprays into  both nostrils daily.   Fluticasone-Salmeterol 250-50 MCG/DOSE Aepb Commonly known as:  ADVAIR Inhale 1 puff into the lungs every 12 (twelve) hours.   furosemide 40 MG tablet Commonly known as:  LASIX Take 1 tablet (40 mg total) by mouth daily for 30 days.   ICAPS AREDS 2 PO Take 1 tablet by mouth daily.   Centrum Silver 50+Women Tabs Take 1 tablet by mouth daily with breakfast.   ipratropium-albuterol 0.5-2.5 (3) MG/3ML Soln Commonly known as:  DUONEB Take 3 mLs by nebulization every 6 (six) hours as needed (WHEEZING).   levothyroxine 100 MCG tablet Commonly known as:  SYNTHROID, LEVOTHROID Take 1 tablet (100 mcg total) by mouth daily at 6 (six) AM for 30 days.   metoprolol tartrate 25 MG tablet Commonly known as:  LOPRESSOR Take 12.5 mg by mouth 2 (two) times daily.   OXYGEN Inhale 2 L into the lungs continuous. SAT MUST BE >90%   pantoprazole 40 MG tablet Commonly known as:  PROTONIX Take 40 mg by mouth daily.   potassium chloride 20 MEQ packet Commonly known as:  KLOR-CON Take 20 mEq by mouth 2 (two) times daily.  Resource Support Liqd Take 90 mLs by mouth 3 (three) times daily.   saccharomyces boulardii 250 MG capsule Commonly known as:  FLORASTOR Take 250 mg by mouth 2 (two) times daily.   tiotropium 18 MCG inhalation capsule Commonly known as:  SPIRIVA Place 18 mcg into inhaler and inhale daily.   zinc oxide 20 % ointment Apply 1 application topically as needed for irritation. Apply to buttocks after every incontinent episode and as needed for redness       Allergies  Allergen Reactions  . Sulfa Antibiotics Shortness Of Breath and Palpitations  . Sulfamethoxazole Shortness Of Breath and Palpitations  . Sulfur Shortness Of Breath and Palpitations    "Allergic," per MAR (although I believe they might've meant "Sulfa"??)  . Amoxicillin Rash  . Penicillin G Rash    Did it involve swelling of the face/tongue/throat, SOB, or low BP? Yes Did it involve  sudden or severe rash/hives, skin peeling, or any reaction on the inside of your mouth or nose? Unk Did you need to seek medical attention at a hospital or doctor's office? Unk When did it last happen? "it was a long time ago" If all above answers are "NO", may proceed with cephalosporin use.   Marland Kitchen Penicillins Rash    Did it involve swelling of the face/tongue/throat, SOB, or low BP? Yes Did it involve sudden or severe rash/hives, skin peeling, or any reaction on the inside of your mouth or nose? Unk Did you need to seek medical attention at a hospital or doctor's office? Unk When did it last happen? "it was a long time ago" If all above answers are "NO", may proceed with cephalosporin use.   . Tape Other (See Comments)    SKIN IS VERY FRAGILE- PLEASE USE AN ALTERNATIVE TO TAPE, AS THE SKIN TEARS EASILY!!    Consultations:  Case discussed with PCCM with recommendations against intubation   Procedures/Studies: Dg Chest 1 View  Result Date: 04/29/2018 CLINICAL DATA:  Post right-sided thoracentesis. EXAM: CHEST  1 VIEW COMPARISON:  04/28/2018; chest CT-04/29/2018 FINDINGS: Grossly unchanged enlarged cardiac silhouette and mediastinal contours with atherosclerotic plaque within thoracic aorta. There is persistent rightward deviation the tracheal air, the level of the aortic arch. There is persistent thickening the right paratracheal stripe presumably to prominent vasculature. Interval reduction in persistent small right-sided pleural effusion post thoracentesis. No pneumothorax. Improved aeration the right lung base with persistent right basilar opacities. Unchanged small to moderate size left-sided effusion and associated left basilar heterogeneous/consolidative opacities. Pulmonary vasculature remains indistinct with cephalization of flow. Moderate scoliotic curvature of the thoracolumbar spine, incompletely evaluated. No definite acute osseous abnormalities. IMPRESSION: 1. Interval reduction  in persistent small right-sided effusion post thoracentesis. No pneumothorax. 2. Improved aeration the right lung base with persistent right basilar opacities, likely atelectasis. 3. Similar findings of pulmonary edema with small to moderate size left-sided effusion associated left basilar opacities, likely atelectasis. Electronically Signed   By: Sandi Mariscal M.D.   On: 04/29/2018 16:27   Dg Chest 2 View  Result Date: 05/02/2018 CLINICAL DATA:  Follow-up recent pneumonia. EXAM: CHEST - 2 VIEW COMPARISON:  05/01/2018 and 03/17/2018 as well as recent chest CT 04/29/2018 FINDINGS: Lungs are adequately inflated demonstrate stable patchy density over the right upper lobe known to represent area of scarring with associated suspicious 1.4 cm nodule by recent CT. Stable small to moderate right pleural effusion and stable small left pleural effusion. Likely associated bibasilar atelectasis. Infection in the lung bases is possible. Cardiomediastinal silhouette  and remainder of the exam is unchanged. IMPRESSION: Stable patchy density in the right upper lobe known to represent scarring with associated suspicious 1.4 cm nodule by recent chest CT. Stable bilateral pleural effusions likely with associated basilar atelectasis. Infection in the lung bases is possible. Electronically Signed   By: Marin Olp M.D.   On: 05/02/2018 09:07   Dg Chest 2 View  Result Date: 05/01/2018 CLINICAL DATA:  Hypoxia. EXAM: CHEST - 2 VIEW COMPARISON:  04/29/2018 FINDINGS: Improved aeration in the left lower chest is suggestive for decreasing pleural fluid. Cardiac silhouette remains enlarged. Right pleural effusion may slightly enlarged. Again noted are patchy densities in the right mid and upper lung. Severe kyphotic deformity near the thoracolumbar spine with probable compression deformity. Difficult to evaluate for interval change in the spine. IMPRESSION: Slightly increased patchy densities in the right lung are worrisome for  pneumonia. Slightly increased right pleural fluid. Improved aeration in the left lower chest may represent decreased left pleural effusion. Electronically Signed   By: Markus Daft M.D.   On: 05/01/2018 08:21   Ct Chest Wo Contrast  Result Date: 04/29/2018 CLINICAL DATA:  Bilateral pleural effusions on chest radiograph EXAM: CT CHEST WITHOUT CONTRAST TECHNIQUE: Multidetector CT imaging of the chest was performed following the standard protocol without IV contrast. COMPARISON:  Chest radiograph dated 04/28/2018 FINDINGS: Cardiovascular: Mild cardiomegaly with left atrial enlargement. No evidence of thoracic aortic aneurysm. Atherosclerotic calcifications of the aortic arch. Three vessel coronary atherosclerosis. Mediastinum/Nodes: Mediastinal lymphadenopathy, including a dominant 2.5 cm short axis node in the low right paratracheal region (series 3/image 60) and 1.6 cm short axis subcarinal node (series 3/image 69). Visualized thyroid is notable for an 8 mm calcified right thyroid nodule. Lungs/Pleura: 13 x 14 mm central right upper lobe nodule (series 4/image 87), suspicious for primary bronchogenic neoplasm. Additional linear/platelike opacities in the right upper lobe, nonspecific. Right lower lobe opacity favors compressive atelectasis (series 3/image 86). Additional atelectasis in the right middle lobe and left lower lobe. Moderate right and small left pleural effusions. Scattered interstitial opacities with interlobular septal thickening in the bilateral upper lobes, suggesting very mild edema. Mild to moderate centrilobular emphysematous changes, upper lobe predominant. No pneumothorax. Upper Abdomen: Visualized upper abdomen is notable for vascular calcifications. Musculoskeletal: Degenerative changes of the visualized thoracolumbar spine. IMPRESSION: 14 mm right upper lobe nodule, suspicious for primary bronchogenic neoplasm. Associated mediastinal lymphadenopathy, including a dominant 2.5 cm short axis  node in the low right paratracheal region. Cardiomegaly with possible mild interstitial edema. Moderate right and small left pleural effusions. Aortic Atherosclerosis (ICD10-I70.0) and Emphysema (ICD10-J43.9). Electronically Signed   By: Julian Hy M.D.   On: 04/29/2018 11:09   Dg Chest Port 1 View  Result Date: 05/22/2018 CLINICAL DATA:  Shortness of breath EXAM: PORTABLE CHEST 1 VIEW COMPARISON:  05/19/2018 FINDINGS: Small bilateral pleural effusions. Bilateral interstitial and alveolar airspace opacities worse in the right lung. No pneumothorax. Stable cardiomediastinal silhouette. No acute osseous abnormality. IMPRESSION: Small bilateral pleural effusions with bilateral interstitial and alveolar airspace opacities. Differential considerations include pulmonary edema versus multilobar pneumonia. Electronically Signed   By: Kathreen Devoid   On: 05/22/2018 08:05   Dg Chest Portable 1 View  Result Date: 05/19/2018 CLINICAL DATA:  Evaluate for post thoracentesis. EXAM: PORTABLE CHEST 1 VIEW COMPARISON:  05/19/2018 FINDINGS: Lungs are adequately inflated demonstrate moderate interval improvement in the right-sided pleural effusion as there is a small amount of residual right pleural fluid likely with associated basilar atelectasis. Moderate left  pleural effusion slightly worse. Likely associated left basilar atelectasis. No pneumothorax. Prominence of the perihilar markings suggesting mild interstitial edema and less likely infection. Mild stable cardiomegaly. Remainder of the exam is unchanged. IMPRESSION: Moderate improvement and right-sided pleural effusion with small residual right effusion likely with associated basilar atelectasis. No pneumothorax. Slight worsening moderate size left pleural effusion likely with associated basilar atelectasis. Mild cardiomegaly with suggestion of interstitial edema and less likely infection. Electronically Signed   By: Marin Olp M.D.   On: 05/19/2018 21:15   Dg  Chest Portable 1 View  Result Date: 05/19/2018 CLINICAL DATA:  Shortness of breath EXAM: PORTABLE CHEST 1 VIEW COMPARISON:  05/02/2018 FINDINGS: Diffuse bilateral interstitial and patchy alveolar airspace opacities. Small bilateral pleural effusions right greater than left. No pneumothorax. Stable cardiomegaly. No acute osseous abnormality. IMPRESSION: Stable cardiomegaly with bilateral interstitial and alveolar airspace opacities with pleural effusions. Differential considerations include pulmonary edema versus multilobar pneumonia. Electronically Signed   By: Kathreen Devoid   On: 05/19/2018 19:10   Dg Chest Port 1 View  Result Date: 04/28/2018 CLINICAL DATA:  Shortness of breath. EXAM: PORTABLE CHEST 1 VIEW COMPARISON:  04/27/2018 and prior exams FINDINGS: Cardiomegaly, pulmonary vascular congestion, interstitial pulmonary edema, bilateral pleural effusions, RIGHT greater than LEFT, and bilateral LOWER lung atelectasis again noted. No pneumothorax. No significant change from the prior study. IMPRESSION: Unchanged appearance of the chest with pulmonary edema, bilateral pleural effusions, RIGHT greater than LEFT, and bibasilar atelectasis. Electronically Signed   By: Margarette Canada M.D.   On: 04/28/2018 09:54   Dg Chest Portable 1 View  Result Date: 04/27/2018 CLINICAL DATA:  Shortness of breath. EXAM: PORTABLE CHEST 1 VIEW COMPARISON:  Radiograph March 17, 2018. FINDINGS: Stable cardiomegaly. Atherosclerosis of thoracic aorta is noted. No pneumothorax is noted. Interval development of mild to moderate bilateral pleural effusions are noted. Bilateral interstitial densities are noted which may represent edema. Bony thorax is unremarkable. IMPRESSION: Stable cardiomegaly with probable bilateral pulmonary edema and interval development of mild to moderate bilateral pleural effusions. Electronically Signed   By: Marijo Conception, M.D.   On: 04/27/2018 13:35   US Thoracentesis Asp Pleural Space W/img  Guide  Result Date: 04/30/2018 INDICATION: Patient with history of acute on chronic diastolic HF, dyspnea, and right pleural effusion. Request made for diagnostic and therapeutic right thoracentesis. EXAM: ULTRASOUND GUIDED DIAGNOSTIC AND THERAPEUTIC RIGHT THORACENTESIS MEDICATIONS: 8 mL 1% lidocaine COMPLICATIONS: None immediate. PROCEDURE: An ultrasound guided thoracentesis was thoroughly discussed with the patient and questions answered. The benefits, risks, alternatives and complications were also discussed. The patient understands and wishes to proceed with the procedure. Written consent was obtained. Ultrasound was performed to localize and mark an adequate pocket of fluid in the right chest. The area was then prepped and draped in the normal sterile fashion. 1% Lidocaine was used for local anesthesia. Under ultrasound guidance a 6 Fr Safe-T-Centesis catheter was introduced. Thoracentesis was performed. The catheter was removed and a dressing applied. FINDINGS: A total of approximately 1.1 L of hazy amber fluid was removed. Samples were sent to the laboratory as requested by the clinical team. IMPRESSION: Successful ultrasound guided right thoracentesis yielding 1.1 L of pleural fluid. Read by: Earley Abide, PA-C Electronically Signed   By: Sandi Mariscal M.D.   On: 04/29/2018 16:31       Subjective: No acute events overnight.  Patient reports she feels great at her baseline.  Discharge Exam: Vitals:   05/23/18 0812 05/23/18 0844  BP: 108/63  Pulse: 88 82  Resp:  18  Temp: 97.7 F (36.5 C)   SpO2: 93% 93%   Vitals:   05/23/18 0650 05/23/18 0743 05/23/18 0812 05/23/18 0844  BP:  129/86 108/63   Pulse:  75 88 82  Resp:  17  18  Temp:  98 F (36.7 C) 97.7 F (36.5 C)   TempSrc:  Oral Oral   SpO2:  93% 93% 93%  Weight: 44.2 kg     Height:        General: Pt is alert, awake, not in acute distress Cardiovascular: RRR, S1/S2 +, no rubs, no gallops Respiratory: CTA bilaterally, no  wheezing, no rhonchi Abdominal: Soft, NT, ND, bowel sounds + Extremities: no edema, no cyanosis    The results of significant diagnostics from this hospitalization (including imaging, microbiology, ancillary and laboratory) are listed below for reference.     Microbiology: Recent Results (from the past 240 hour(s))  Gram stain     Status: None   Collection Time: 05/19/18  7:31 PM  Result Value Ref Range Status   Specimen Description PLEURAL  Final   Special Requests NONE  Final   Gram Stain   Final    WBC PRESENT, PREDOMINANTLY MONONUCLEAR NO ORGANISMS SEEN Performed at [facility], [address], [place], [place] [address]    Report Status 05/19/2018 FINAL  Final  Culture, body fluid-bottle     Status: None (Preliminary result)   Collection Time: 05/19/18  8:45 PM  Result Value Ref Range Status   Specimen Description PLEURAL  Final   Special Requests NONE  Final   Culture   Final    NO GROWTH 4 DAYS Performed at [facility] [address] [place], [place] [address]    Report Status PENDING  Incomplete  Novel Coronavirus, NAA (hospital order; send-out to ref lab)     Status: None   Collection Time: 05/19/18  9:05 PM  Result Value Ref Range Status   SARS-CoV-2, NAA NOT DETECTED NOT DETECTED Final    Comment: Performed at [place]  Final    Comment: Performed at [facility], [place] [address], [place], [place] [address]  MRSA PCR Screening     Status: None   Collection Time: 05/20/18 12:01 AM  Result Value Ref Range Status   MRSA by PCR NEGATIVE NEGATIVE Final    Comment:        The GeneXpert MRSA Assay (FDA approved for NASAL specimens only), is one component of a comprehensive MRSA colonization surveillance program. It is not intended to diagnose MRSA infection nor to guide or monitor treatment for MRSA infections. Performed at [facility], [place] [address] [place], [place] [address]   Blood  culture (routine x 2)     Status: None (Preliminary result)   Collection Time: 05/20/18 12:54 AM  Result Value Ref Range Status   Specimen Description BLOOD RIGHT FOREARM  Final   Special Requests   Final    BOTTLES DRAWN AEROBIC AND ANAEROBIC Blood Culture adequate volume   Culture   Final    NO GROWTH 3 DAYS Performed at [facility], [place] [address] [place], [place] [address]    Report Status PENDING  Incomplete  Blood culture (routine x 2)     Status: None (Preliminary result)   Collection Time: 05/20/18 12:54 AM  Result Value Ref Range Status   Specimen Description BLOOD LEFT ANTECUBITAL  Final   Special Requests   Final    BOTTLES DRAWN AEROBIC AND ANAEROBIC Blood Culture adequate volume   Culture   Final    NO GROWTH 3 DAYS Performed at [facility], [address] [place], [place] [address]    Report Status PENDING  Incomplete  Respiratory Panel by PCR     Status: None   Collection Time: 05/20/18  9:44 AM  Result Value Ref Range Status   Adenovirus NOT DETECTED NOT DETECTED Final   Coronavirus 229E NOT DETECTED NOT DETECTED Final    Comment: (NOTE) The Coronavirus on the Respiratory Panel, DOES NOT test for the novel  Coronavirus (2019 nCoV)    Coronavirus HKU1 NOT DETECTED NOT DETECTED Final   Coronavirus NL63 NOT DETECTED NOT DETECTED Final   Coronavirus OC43 NOT DETECTED NOT DETECTED Final   Metapneumovirus NOT DETECTED NOT DETECTED Final   Rhinovirus / Enterovirus NOT DETECTED NOT DETECTED Final   Influenza A NOT DETECTED NOT DETECTED Final   Influenza B NOT DETECTED NOT DETECTED Final   Parainfluenza Virus 1 NOT DETECTED NOT DETECTED Final   Parainfluenza Virus 2 NOT DETECTED NOT DETECTED Final   Parainfluenza Virus 3 NOT DETECTED NOT DETECTED Final   Parainfluenza Virus 4 NOT DETECTED NOT DETECTED Final   Respiratory Syncytial Virus NOT DETECTED NOT DETECTED Final   Bordetella pertussis NOT DETECTED NOT DETECTED Final   Chlamydophila pneumoniae  NOT DETECTED NOT DETECTED Final   Mycoplasma pneumoniae NOT DETECTED NOT DETECTED Final    Comment: Performed at [facility], [address] [place], [place] [address]     Labs: BNP (last 3 results) Recent Labs    04/27/18 1123 04/30/18 1333 05/19/18 2000  BNP 289.4* 352.0* 324.4*   Basic Metabolic Panel: Recent Labs  Lab 05/19/18 2000 05/20/18 0054 05/21/18 0815 05/22/18 0623 05/23/18 0326  NA 134* 134* 138 137 136  K 3.6 3.5 3.8 3.3* 3.0*  CL 95* 94* 95* 93* 93*  CO2 26 26 33* 35* 33*  GLUCOSE 117* 157* 96 95 94  BUN 18 17 26* 26* 25*  CREATININE 1.10* 1.11* 1.48* 1.32* 1.17*  CALCIUM 8.8* 8.5* 9.1 8.6* 8.5*   Liver Function Tests: Recent Labs  Lab 05/19/18 2000  AST 19  ALT 19  ALKPHOS 79  BILITOT 0.7  PROT 6.3*  ALBUMIN 3.1*   No results for input(s): LIPASE, AMYLASE in the last 168 hours. No results for input(s): AMMONIA in the last 168 hours. CBC: Recent Labs  Lab 05/19/18 2000 05/21/18 0815  WBC 10.0 12.0*  NEUTROABS 7.9* 9.1*  HGB 11.2* 10.4*  HCT 35.8* 31.9*  MCV 94.7 94.4  PLT 248 289   Cardiac Enzymes: Recent Labs  Lab 05/19/18 2000  TROPONINI <0.03   BNP: Invalid input(s): POCBNP CBG: Recent Labs  Lab 05/22/18 2058  GLUCAP 114*   D-Dimer No results for input(s): DDIMER in the last 72 hours. Hgb A1c No results for input(s): HGBA1C in the last 72 hours. Lipid Profile No results for input(s): CHOL, HDL, LDLCALC, TRIG, CHOLHDL, LDLDIRECT in the last 72 hours. Thyroid function studies No results for input(s): TSH, T4TOTAL, T3FREE, THYROIDAB in the last 72 hours.  Invalid input(s): FREET3 Anemia work up No results for input(s): VITAMINB12, FOLATE, FERRITIN, TIBC, IRON, RETICCTPCT in the last 72 hours. Urinalysis    Component Value Date/Time   COLORURINE YELLOW 03/16/2012 [place] 03/16/2012 1126   LABSPEC 1.020 03/16/2012 1126   PHURINE 7.5 03/16/2012  [place] 03/16/2012 [place] 03/16/2012 [place] 03/16/2012 1126   KETONESUR TRACE (A) 03/16/2012 1126   PROTEINUR 30 (A) 03/16/2012 1126   UROBILINOGEN 1.0 03/16/2012 1126   NITRITE NEGATIVE 03/16/2012 1126   LEUKOCYTESUR NEGATIVE 03/16/2012 1126   Sepsis Labs Invalid input(s): PROCALCITONIN,  WBC,  LACTICIDVEN Microbiology Recent Results (from the past 240 hour(s))  Gram stain     Status: None   Collection Time: 05/19/18  7:31 PM  Result Value Ref Range Status   Specimen Description PLEURAL  Final   Special Requests NONE  Final   Gram Stain   Final    WBC PRESENT, PREDOMINANTLY MONONUCLEAR NO ORGANISMS SEEN Performed at [facility], [place] [address], [place], [place] [address]    Report Status 05/19/2018 FINAL  Final  Culture, body fluid-bottle     Status: None (Preliminary result)   Collection Time: 05/19/18  8:45 PM  Result Value Ref Range Status   Specimen Description PLEURAL  Final   Special Requests NONE  Final   Culture   Final    NO GROWTH 4 DAYS Performed at [place] [address], [place], [place] [address]    Report Status PENDING  Incomplete  Novel Coronavirus, NAA (hospital order; send-out to ref lab)     Status: None   Collection Time: 05/19/18  9:05 PM  Result Value Ref Range Status   SARS-CoV-2, NAA NOT DETECTED NOT DETECTED Final    Comment: Performed at [facility]  Final    Comment: Performed at [facility], [place] [address] [place], [place] [address]  MRSA PCR Screening     Status: None   Collection Time: 05/20/18 12:01 AM  Result Value Ref Range Status   MRSA by PCR NEGATIVE NEGATIVE Final    Comment:        The GeneXpert MRSA Assay (FDA approved for NASAL specimens only), is one component of a comprehensive MRSA colonization surveillance program. It is not intended to diagnose MRSA infection nor to guide or monitor treatment for MRSA infections. Performed at [facility], [place] [address] [place], [place] [address]   Blood culture (routine x 2)     Status: None (Preliminary result)   Collection Time: 05/20/18 12:54 AM  Result Value Ref Range Status   Specimen Description BLOOD RIGHT FOREARM  Final   Special Requests   Final    BOTTLES DRAWN AEROBIC AND ANAEROBIC Blood Culture adequate volume   Culture   Final    NO GROWTH 3 DAYS Performed at [facility], [place] [address], [place], [place] [address]    Report Status PENDING  Incomplete  Blood culture (routine x 2)     Status: None (Preliminary result)   Collection Time: 05/20/18 12:54 AM  Result Value Ref Range Status   Specimen Description BLOOD LEFT ANTECUBITAL  Final   Special Requests   Final    BOTTLES DRAWN AEROBIC AND ANAEROBIC Blood Culture adequate volume   Culture   Final    NO GROWTH 3 DAYS Performed at [facility], [place] [address], [place], [place] [address]    Report Status PENDING  Incomplete  Respiratory Panel by PCR     Status: None   Collection Time: 05/20/18  9:44 AM  Result Value Ref Range Status   Adenovirus NOT DETECTED NOT DETECTED Final  Coronavirus 229E NOT DETECTED NOT DETECTED Final    Comment: (NOTE) The Coronavirus on the Respiratory Panel, DOES NOT test for the novel  Coronavirus (2019 nCoV)    Coronavirus HKU1 NOT DETECTED NOT DETECTED Final   Coronavirus NL63 NOT DETECTED NOT DETECTED Final   Coronavirus OC43 NOT DETECTED NOT DETECTED Final   Metapneumovirus NOT DETECTED NOT DETECTED Final   Rhinovirus / Enterovirus NOT DETECTED NOT DETECTED Final   Influenza A NOT DETECTED NOT DETECTED Final   Influenza B NOT DETECTED NOT DETECTED Final   Parainfluenza Virus 1 NOT DETECTED NOT DETECTED Final   Parainfluenza Virus 2 NOT DETECTED NOT DETECTED Final   Parainfluenza Virus 3 NOT DETECTED NOT DETECTED Final   Parainfluenza Virus 4 NOT DETECTED NOT DETECTED Final   Respiratory Syncytial Virus NOT DETECTED NOT DETECTED Final   Bordetella pertussis  NOT DETECTED NOT DETECTED Final   Chlamydophila pneumoniae NOT DETECTED NOT DETECTED Final   Mycoplasma pneumoniae NOT DETECTED NOT DETECTED Final    Comment: Performed at [facility], [place] [address] [place], [place] [address]     Time coordinating discharge: Over 30 minutes  SIGNED:   Nicolette Bang, MD  [facility] 05/23/2018, 10:57 AM Pager   If 7PM-7AM, please contact night-coverage [email or web address] Password [email or web address]

## 2018-05-23 NOTE — TOC Transition Note (Signed)
Transition of Care [facility]) - CM/SW Discharge Note   Patient Details  Name: BIRDIE FETTY MRN: [id number] Date of Birth: 12-29-1917  Transition of Care [facility]) CM/SW Contact:  Candie Chroman, LCSW Phone Number: 05/23/2018, 12:03 PM   Clinical Narrative: CSW facilitated patient discharge including contacting patient family and facility to confirm patient discharge plans. Clinical information faxed to facility and family agreeable with plan. CSW arranged ambulance transport via [facility] to [facility]. RN to call report prior to discharge [phone]).  CSW will sign off for now as social work intervention is no longer needed. Please consult [place] again if new needs arise.  Final next level of care: Skilled Nursing Facility Barriers to Discharge: Barriers Resolved   Patient Goals and CMS Choice Patient states their goals for this hospitalization and ongoing recovery are:: to get back to friends home CMS Medicare.gov Compare Post Acute Care list provided to:: Other (Comment Required)(patient returning to previous snf)    Discharge Placement              Patient chooses bed at: [facility] Patient to be transferred to facility by: [facility] Name of family member notified: She said there was no need to call her niece. Patient and family notified of of transfer: 05/23/18  Discharge Plan and Services   Discharge Planning Services: CM Consult Post Acute Care Choice: [place]          DME Arranged: N/A DME Agency: NA HH Arranged: NA HH Agency: NA   Social Determinants of Health (SDOH) Interventions     Readmission Risk Interventions Readmission Risk Prevention Plan 05/22/2018 05/02/2018 04/28/2018  Transportation Screening Complete - Complete  PCP or Specialist Appt within 3-5 Days - - Not Complete  Not Complete comments - - Will check closer to discharge.  [place] or Home Care Consult - - Not Complete  Social Work Consult for [place] Planning/Counseling - -  Complete  Palliative Care Screening - - Not Applicable  Medication Review (RN Care Manager) - - Referral to Pharmacy  PCP or Specialist appointment within 3-5 days of discharge Not Complete - -  HRI or [place] Not Complete Not Complete -  HRI or Home Care Consult Pt Refusal Comments (No Data) Going to SNF -  SW Recovery Care/Counseling Consult Complete Complete -  Palliative Care Screening Not Applicable Not Applicable -  Skilled Nursing Facility Complete Complete -  Some recent data might be hidden

## 2018-05-23 NOTE — Progress Notes (Signed)
RN spoke with Baltazar Najjar, NP about negative Covid-19 test result, per NP it is ok to transfer the patient to another unit.

## 2018-05-24 LAB — CULTURE, BODY FLUID W GRAM STAIN -BOTTLE: Culture: NO GROWTH

## 2018-05-25 ENCOUNTER — Encounter: Payer: Self-pay | Admitting: Internal Medicine

## 2018-05-25 LAB — CULTURE, BLOOD (ROUTINE X 2)
Culture: NO GROWTH
Culture: NO GROWTH
Special Requests: ADEQUATE
Special Requests: ADEQUATE

## 2018-05-25 NOTE — Progress Notes (Signed)
A user error has taken place.

## 2018-05-26 ENCOUNTER — Ambulatory Visit: Payer: Medicare Other | Admitting: Physician Assistant

## 2018-05-26 LAB — BODY FLUID CELL COUNT WITH DIFFERENTIAL
Eos, Fluid: 0 %
Lymphs, Fluid: 83 %
Monocyte-Macrophage-Serous Fluid: 16 % — ABNORMAL LOW (ref 50–90)
Neutrophil Count, Fluid: 1 % (ref 0–25)
Total Nucleated Cell Count, Fluid: 630 cu mm (ref 0–1000)

## 2018-05-26 LAB — GLUCOSE, PLEURAL OR PERITONEAL FLUID: Glucose, Fluid: 129 mg/dL

## 2018-05-29 LAB — CBC AND DIFFERENTIAL
HCT: 29 — AB (ref 36–46)
Hemoglobin: 9.6 — AB (ref 12.0–16.0)
Platelets: 339 (ref 150–399)

## 2018-05-29 LAB — FUNGUS CULTURE WITH STAIN

## 2018-05-29 LAB — BASIC METABOLIC PANEL
BUN: 37 — AB (ref 4–21)
Creatinine: 1.6 — AB (ref 0.5–1.1)
Glucose: 89
Potassium: 4.8 (ref 3.4–5.3)
Sodium: 138 (ref 137–147)

## 2018-05-29 LAB — FUNGUS CULTURE RESULT

## 2018-05-29 LAB — FUNGAL ORGANISM REFLEX

## 2018-05-31 ENCOUNTER — Encounter: Payer: Self-pay | Admitting: Internal Medicine

## 2018-05-31 ENCOUNTER — Non-Acute Institutional Stay (SKILLED_NURSING_FACILITY): Payer: Medicare Other | Admitting: Internal Medicine

## 2018-05-31 DIAGNOSIS — I509 Heart failure, unspecified: Secondary | ICD-10-CM | POA: Diagnosis not present

## 2018-05-31 DIAGNOSIS — I4819 Other persistent atrial fibrillation: Secondary | ICD-10-CM | POA: Diagnosis not present

## 2018-05-31 DIAGNOSIS — E032 Hypothyroidism due to medicaments and other exogenous substances: Secondary | ICD-10-CM | POA: Diagnosis not present

## 2018-05-31 DIAGNOSIS — J449 Chronic obstructive pulmonary disease, unspecified: Secondary | ICD-10-CM

## 2018-05-31 DIAGNOSIS — I5033 Acute on chronic diastolic (congestive) heart failure: Secondary | ICD-10-CM | POA: Diagnosis not present

## 2018-05-31 DIAGNOSIS — N183 Chronic kidney disease, stage 3 unspecified: Secondary | ICD-10-CM

## 2018-05-31 NOTE — Progress Notes (Signed)
Provider:Gupta Rene Kocher, MD    Location:  [facility] Room Number: 103 Place of Service:  SNF [phone])  PCP: [name], MD Patient Care Team: [name], MD as PCP - General (Internal Medicine) Debara Pickett Nadean Corwin, MD as PCP - Cardiology (Cardiology) Nicholas Lose, MD as Consulting Physician (Hematology and Oncology)  Extended Emergency Contact Information Primary Emergency Contact: Little Ishikawa Mobile Phone: [phone] Relation: Niece Secondary Emergency Contact: Cromer,Chad Mobile Phone: [phone] Relation: Nephew  Code Status:DNR Goals of Care: Advanced Directive information Advanced Directives 05/31/2018  Does Patient Have a Medical Advance Directive? No  Type of Advance Directive -  Does patient want to make changes to medical advance directive? No - Patient declined  Copy of [place] in Chart? -  Would patient like information on creating a medical advance directive? No - Patient declined  Pre-existing out of facility DNR order (yellow form or pink MOST form) -      Chief Complaint  Patient presents with  . Readmit To SNF    Readmit to facility     HPI: Patient is a 85 y.o. female seen today for Readmission to Facility. Patient has had number of admissions in the hospital  She was again send to the hospital and was admitted from 04/03-04/07 SOB and was found to be Volume Overload and Right Thoracocentesis.   Patient has h/o H/o TIA in 2010 s/p CEA in 2010, COPD on oxygen at night, Hypertension,GERD,Hypothyroidism, Gastritis and Paget's disease of Nipple  And Recently she has been diagnosed with New onset Atrial Fibrillation, CHF, bilateral pleural effusions requiring thoracocentesis and right upper lobe nodule suspicious of cancer. Patient was diuresed again in the hospital and underwent Right sided thoracocentesis. She feels much better though still is on 4 l of Oxygen. Has some cough but No chest pain or fever.  Walking with therapy almost 100 feet. Continues to be very weak. Appetite has improved also Past Medical History:  Diagnosis Date  . Arthritis   . Constipation   . COPD (chronic obstructive pulmonary disease) ([place])   . Fatigue   . Frequent UTI   . GERD (gastroesophageal reflux disease)   . History of shingles 2007  . Hyperlipemia   . Hypertension   . Hypothyroidism   . Multiple allergies   . Osteoporosis   . 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 Pneumonia   . Seasonal allergies   . Stroke [facility]) 2010   no residual  . Thyroid disease   . Uterine cancer [facility])    Past Surgical History:  Procedure Laterality Date  . ABDOMINAL HYSTERECTOMY    . BREAST LUMPECTOMY     left breast  . CAROTID ENDARTERECTOMY Left June 10, 2008   CE  . EYE SURGERY     cataracts removed bilaterally  . TONSILLECTOMY      reports that she quit smoking about 37 years ago. Her smoking use included cigarettes. She has a 40.00 pack-year smoking history. She has never used smokeless tobacco. She reports current alcohol use. She reports that she does not use drugs. Social History   Socioeconomic History  . Marital status: Widowed    Spouse name: Not on file  . Number of children: Not on file  . Years of education: Not on file  . Highest education level: Not on file  Occupational History  . Not on file  Social Needs  . Financial resource strain: Not on file  .  Food insecurity:    Worry: Not on file    Inability: Not on file  . Transportation needs:    Medical: Not on file    Non-medical: Not on file  Tobacco Use  . Smoking status: Former Smoker    Packs/day: 1.00    Years: 40.00    Pack years: 40.00    Types: Cigarettes    Last attempt to quit: 02/15/1981    Years since quitting: 37.3  . Smokeless tobacco: Never Used  Substance and Sexual Activity  . Alcohol use: Yes  . Drug use: No  . Sexual activity: Not Currently  Lifestyle  . Physical activity:    Days per week: Not on  file    Minutes per session: Not on file  . Stress: Not on file  Relationships  . Social connections:    Talks on phone: Not on file    Gets together: Not on file    Attends religious service: Not on file    Active member of club or organization: Not on file    Attends meetings of clubs or organizations: Not on file    Relationship status: Not on file  . Intimate partner violence:    Fear of current or ex partner: Not on file    Emotionally abused: Not on file    Physically abused: Not on file    Forced sexual activity: Not on file  Other Topics Concern  . Not on file  Social History Narrative   Lives at [facility] Status Survey:    Family History  Problem Relation Age of Onset  . Heart failure Mother   . Heart disease Mother   . Heart failure Father   . Heart disease Father   . Heart disease Brother   . Asthma Paternal Grandmother     Health Maintenance  Topic Date Due  . Samul Dada  10/03/1936  . PNA vac Low Risk Adult (1 of 2 - PCV13) 10/02/1984  . INFLUENZA VACCINE  09/16/2018  . DEXA SCAN  Completed    Allergies  Allergen Reactions  . Sulfa Antibiotics Shortness Of Breath and Palpitations  . Sulfamethoxazole Shortness Of Breath and Palpitations  . Sulfur Shortness Of Breath and Palpitations    "Allergic," per MAR (although I believe they might've meant "Sulfa"??)  . Amoxicillin Rash  . Penicillin G Rash    Did it involve swelling of the face/tongue/throat, SOB, or low BP? Yes Did it involve sudden or severe rash/hives, skin peeling, or any reaction on the inside of your mouth or nose? Unk Did you need to seek medical attention at a hospital or doctor's office? Unk When did it last happen? "it was a long time ago" If all above answers are "NO", may proceed with cephalosporin use.   Marland Kitchen Penicillins Rash    Did it involve swelling of the face/tongue/throat, SOB, or low BP? Yes Did it involve sudden or severe rash/hives, skin peeling,  or any reaction on the inside of your mouth or nose? Unk Did you need to seek medical attention at a hospital or doctor's office? Unk When did it last happen? "it was a long time ago" If all above answers are "NO", may proceed with cephalosporin use.   . Tape Other (See Comments)    SKIN IS VERY FRAGILE- PLEASE USE AN ALTERNATIVE TO TAPE, AS THE SKIN TEARS EASILY!!    Outpatient Encounter Medications as of 05/31/2018  Medication Sig  . amiodarone ([place])  200 MG tablet Take 200 mg by mouth daily.  Marland Kitchen apixaban (ELIQUIS) 2.5 MG TABS tablet Take 1 tablet (2.5 mg total) by mouth 2 (two) times daily.  Marland Kitchen diltiazem (CARDIZEM CD) 120 MG 24 hr capsule Take 1 capsule (120 mg total) by mouth daily.  . ergocalciferol (VITAMIN D2) 1.25 MG (50000 UT) capsule Take 1,250 Units by mouth once a week. Once a day on Sunday  . fluticasone (FLONASE) 50 MCG/ACT nasal spray Place 2 sprays into both nostrils daily.   . Fluticasone-Salmeterol (ADVAIR) 250-50 MCG/DOSE AEPB Inhale 1 puff into the lungs every 12 (twelve) hours.  . furosemide (LASIX) 40 MG tablet Take 1 tablet (40 mg total) by mouth daily for 30 days.  Marland Kitchen ipratropium-albuterol (DUONEB) 0.5-2.5 (3) MG/3ML SOLN Take 3 mLs by nebulization every 6 (six) hours as needed (WHEEZING).  Marland Kitchen levothyroxine (SYNTHROID, LEVOTHROID) 100 MCG tablet Take 1 tablet (100 mcg total) by mouth daily at 6 (six) AM for 30 days.  . metoprolol tartrate (LOPRESSOR) 25 MG tablet Take 12.5 mg by mouth 2 (two) times daily.  . Multiple Vitamins-Minerals (CENTRUM SILVER 50+WOMEN) TABS Take 1 tablet by mouth daily with breakfast.  . Multiple Vitamins-Minerals (ICAPS AREDS 2 PO) Take 1 tablet by mouth daily.  . Nutritional Supplements (RESOURCE SUPPORT) LIQD Take 90 mLs by mouth 3 (three) times daily.  . OXYGEN Inhale 2 L into the lungs continuous. SAT MUST BE >90%  . pantoprazole (PROTONIX) 40 MG tablet Take 40 mg by mouth daily.   . potassium chloride (KLOR-CON) 20 MEQ packet Take 20 mEq  by mouth 2 (two) times daily.  Marland Kitchen saccharomyces boulardii (FLORASTOR) 250 MG capsule Take 250 mg by mouth 2 (two) times daily.  Marland Kitchen tiotropium (SPIRIVA) 18 MCG inhalation capsule Place 18 mcg into inhaler and inhale daily.  Marland Kitchen zinc oxide 20 % ointment Apply 1 application topically as needed for irritation. Apply to buttocks after every incontinent episode and as needed for redness  . amiodarone (PACERONE) 400 MG tablet Take 1 tablet (400 mg total) by mouth daily for 27 days. (decrease to 200 mg daily on April 14th)   No facility-administered encounter medications on file as of 05/31/2018.     Review of Systems  Constitutional: Positive for activity change and unexpected weight change.  HENT: Negative.   Respiratory: Positive for cough and shortness of breath.   Cardiovascular: Negative.   Gastrointestinal: Negative.   Genitourinary: Negative.   Musculoskeletal: Negative.   Skin: Negative.   Neurological: Positive for weakness.  Psychiatric/Behavioral: Negative.     Vitals:   05/31/18 1111  BP: 118/78  Pulse: 92  Resp: 20  Temp: 98 F (36.7 C)  SpO2: 91%  Weight: 98 lb (44.5 kg)  Height: 4\' 10"  (1.473 m)   Body mass index is 20.48 kg/m. Physical Exam Vitals signs reviewed.  Constitutional:      Appearance: She is normal weight.  HENT:     Head: Normocephalic.     Nose: Nose normal.     Mouth/Throat:     Mouth: Mucous membranes are dry.     Pharynx: Oropharynx is clear.  Eyes:     Pupils: Pupils are equal, round, and reactive to light.  Neck:     Musculoskeletal: Neck supple.  Cardiovascular:     Rate and Rhythm: Normal rate and regular rhythm.     Pulses: Normal pulses.     Heart sounds: Normal heart sounds.  Pulmonary:     Effort: Pulmonary effort is normal.  Comments: Has Rale sin Left Lower Lobe Abdominal:     General: Abdomen is flat. Bowel sounds are normal. There is no distension.     Palpations: Abdomen is soft.     Tenderness: There is no abdominal  tenderness.  Musculoskeletal:        General: No swelling.  Skin:    General: Skin is warm and dry.  Neurological:     General: No focal deficit present.     Mental Status: She is alert and oriented to person, place, and time.  Psychiatric:        Mood and Affect: Mood normal.        Thought Content: Thought content normal.     Labs reviewed: Basic Metabolic Panel: Recent Labs    03/17/18 1753  05/01/18 0545 05/02/18 0402  05/21/18 0815 05/22/18 0626 05/23/18 0326 05/29/18  NA  --    < > 134* 133*   < > 138 137 136 138  K  --    < > 3.9 4.4   < > 3.8 3.3* 3.0* 4.8  CL  --    < > 95* 96*   < > 95* 93* 93*  --   CO2  --    < > 29 27   < > 33* 35* 33*  --   GLUCOSE  --    < > 140* 131*   < > 96 95 94  --   BUN  --    < > 34* 43*   < > 26* 26* 25* 37*  CREATININE  --    < > 1.39* 1.50*   < > 1.48* 1.32* 1.17* 1.6*  CALCIUM  --    < > 8.9 8.7*   < > 9.1 8.6* 8.5*  --   MG 1.7  --  2.0 1.9  --   --   --   --   --    < > = values in this interval not displayed.   Liver Function Tests: Recent Labs    05/01/18 0545 05/02/18 0402 05/11/18 05/19/18 2000  AST 19 23 11* 19  ALT 19 24 12 19   ALKPHOS 62 56 56 79  BILITOT 0.5 0.6  --  0.7  PROT 6.3* 5.7*  --  6.3*  ALBUMIN 3.0* 2.9*  --  3.1*   No results for input(s): LIPASE, AMYLASE in the last 8760 hours. No results for input(s): AMMONIA in the last 8760 hours. CBC: Recent Labs    03/17/18 1604  05/02/18 0402 05/11/18 05/19/18 2000 05/21/18 0815 05/29/18  WBC 17.1*   < > 9.2 12.1 10.0 12.0*  --   NEUTROABS 12.8*  --   --   --  7.9* 9.1*  --   HGB 11.3*   < > 9.9* 10.8* 11.2* 10.4* 9.6*  HCT 34.5*   < > 30.2* 32* 35.8* 31.9* 29*  MCV 93.8   < > 93.8  --  94.7 94.4  --   PLT 253   < > 348 282 248 289 339   < > = values in this interval not displayed.   Cardiac Enzymes: Recent Labs    03/17/18 1604 04/27/18 1123 05/19/18 2000  TROPONINI <0.03 <0.03 <0.03   BNP: Invalid input(s): POCBNP No results found for:  HGBA1C Lab Results  Component Value Date   TSH 10.029 (H) 04/27/2018   Lab Results  Component Value Date   RSWNIOEV03 500 04/07/2018   No results found for: FOLATE Lab  Results  Component Value Date   IRON 78 04/06/2018   TIBC 244 04/06/2018    Imaging and Procedures obtained prior to SNF admission: Dg Chest Portable 1 View  Result Date: 05/19/2018 CLINICAL DATA:  Evaluate for post thoracentesis. EXAM: PORTABLE CHEST 1 VIEW COMPARISON:  05/19/2018 FINDINGS: Lungs are adequately inflated demonstrate moderate interval improvement in the right-sided pleural effusion as there is a small amount of residual right pleural fluid likely with associated basilar atelectasis. Moderate left pleural effusion slightly worse. Likely associated left basilar atelectasis. No pneumothorax. Prominence of the perihilar markings suggesting mild interstitial edema and less likely infection. Mild stable cardiomegaly. Remainder of the exam is unchanged. IMPRESSION: Moderate improvement and right-sided pleural effusion with small residual right effusion likely with associated basilar atelectasis. No pneumothorax. Slight worsening moderate size left pleural effusion likely with associated basilar atelectasis. Mild cardiomegaly with suggestion of interstitial edema and less likely infection. Electronically Signed   By: Marin Olp M.D.   On: 05/19/2018 21:15   Dg Chest Portable 1 View  Result Date: 05/19/2018 CLINICAL DATA:  Shortness of breath EXAM: PORTABLE CHEST 1 VIEW COMPARISON:  05/02/2018 FINDINGS: Diffuse bilateral interstitial and patchy alveolar airspace opacities. Small bilateral pleural effusions right greater than left. No pneumothorax. Stable cardiomegaly. No acute osseous abnormality. IMPRESSION: Stable cardiomegaly with bilateral interstitial and alveolar airspace opacities with pleural effusions. Differential considerations include pulmonary edema versus multilobar pneumonia. Electronically Signed   By:  Kathreen Devoid   On: 05/19/2018 19:10    Assessment/Plan Acute on chronic diastolic (congestive) heart failure  Her weight is up again today to 101 lbs Will start her on Torsemide with her lasix for few days to  Keep her weight less then 100 lbs Patient gets volume overload very easily Continues to be very frail Continue to monitor her weights Repeat BMP in 1 week Start on  1200 cc /24 FR   Pleural effusion due to CHF  S/P Thoracocentsis With Possible suspicious Upper right Lobe nodule ? Bronchogenic carcinoma Patient is aware and does not want any treatment right now Chronic obstructive pulmonary disease,  Continue on Spiriva  On 3-4 l of oxygen Hypothyroidism  Dose was increased in 03/20 Repeat TSH in 8 weeks  CKD (chronic kidney disease) stage 3,  Last creat was at baseline BUN was slightly up at 37 Repeat BMP on toresimide   Atrial Fibrillation On amiodarone and Eliquis Also she is on Cardizem and low dose of Lopressor Anemia Hgb is low but stable   Family/ staff Communication:   Labs/tests ordered:BMp in 1 week Total time spent in this patient care encounter was  45_  minutes; greater than 50% of the visit spent counseling patient and staff, reviewing records , Labs and coordinating care for problems addressed at this encounter.

## 2018-06-02 DIAGNOSIS — R6889 Other general symptoms and signs: Secondary | ICD-10-CM | POA: Diagnosis not present

## 2018-06-08 DIAGNOSIS — J9611 Chronic respiratory failure with hypoxia: Secondary | ICD-10-CM | POA: Diagnosis not present

## 2018-06-08 LAB — BASIC METABOLIC PANEL
BUN: 38 — AB (ref 4–21)
Creatinine: 1.5 — AB (ref 0.5–1.1)
Glucose: 90
Potassium: 4.8 (ref 3.4–5.3)
Sodium: 137 (ref 137–147)

## 2018-06-11 ENCOUNTER — Encounter: Payer: Self-pay | Admitting: Internal Medicine

## 2018-06-13 LAB — ACID FAST CULTURE WITH REFLEXED SENSITIVITIES (MYCOBACTERIA)

## 2018-06-13 LAB — ACID FAST CULTURE WITH REFLEXED SENSITIVITIES: ACID FAST CULTURE - AFSCU3: NEGATIVE

## 2018-06-14 ENCOUNTER — Non-Acute Institutional Stay (SKILLED_NURSING_FACILITY): Payer: Medicare Other | Admitting: Internal Medicine

## 2018-06-14 ENCOUNTER — Other Ambulatory Visit: Payer: Self-pay | Admitting: *Deleted

## 2018-06-14 ENCOUNTER — Encounter: Payer: Self-pay | Admitting: Internal Medicine

## 2018-06-14 DIAGNOSIS — J209 Acute bronchitis, unspecified: Secondary | ICD-10-CM | POA: Diagnosis not present

## 2018-06-14 DIAGNOSIS — N183 Chronic kidney disease, stage 3 unspecified: Secondary | ICD-10-CM

## 2018-06-14 DIAGNOSIS — I5033 Acute on chronic diastolic (congestive) heart failure: Secondary | ICD-10-CM

## 2018-06-14 DIAGNOSIS — I509 Heart failure, unspecified: Secondary | ICD-10-CM | POA: Diagnosis not present

## 2018-06-14 DIAGNOSIS — J440 Chronic obstructive pulmonary disease with acute lower respiratory infection: Secondary | ICD-10-CM

## 2018-06-14 LAB — BASIC METABOLIC PANEL
Calcium: 8.7
Carbon Dioxide, Total: 32
Chloride: 98
EGFR (Non-African Amer.): 28

## 2018-06-14 NOTE — Progress Notes (Signed)
Location:  [facility] Room Number: 107 Place of Service:  SNF [phone]) Provider:  Veleta Miners  MD  [name], MD  Patient Care Team: [name], MD as PCP - General (Internal Medicine) Pixie Casino, MD as PCP - Cardiology (Cardiology) Nicholas Lose, MD as Consulting Physician (Hematology and Oncology)  Extended Emergency Contact Information Primary Emergency Contact: Little Ishikawa Mobile Phone: [phone] Relation: Niece Secondary Emergency Contact: Cromer,Chad Mobile Phone: [phone] Relation: Nephew  Code Status:  DNR Goals of care: Advanced Directive information Advanced Directives 06/14/2018  Does Patient Have a Medical Advance Directive? Yes  Type of Advance Directive Out of facility DNR (pink MOST or yellow form);Healthcare Power of Attorney  Does patient want to make changes to medical advance directive? No - Patient declined  Copy of [place] in Chart? Yes - validated most recent copy scanned in chart (See row information)  Would patient like information on creating a medical advance directive? -  Pre-existing out of facility DNR order (yellow form or pink MOST form) Yellow form placed in chart (order not valid for inpatient use);Pink MOST form placed in chart (order not valid for inpatient use)     Chief Complaint  Patient presents with   Acute Visit    C/o - SOB    HPI:  Pt is a 85 y.o. female seen today for an acute visit for SOB and Weight Gain  Patient has had number of admissions in the hospital due to SOB , Pleural Effusion and CHF  She was again send to the hospital and was admitted from 04/03-04/07 SOB and was found to be Volume Overload and Needed Right Thoracocentesis.  Patient has PMH of  TIA in 2010 s/p CEA in 2010, COPD on oxygen at night, Hypertension,GERD,Hypothyroidism, Gastritis and Paget's disease of Nipple  And Recently she has been diagnosed with New onset Atrial Fibrillation, CHF,  bilateral pleural effusions requiring thoracocentesis and right upper lobe nodule suspicious of cancer.  Patient is doing well in facility. But for past few days she is having Worsening of her SOB, Cough and Weakness Appetite is good. 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   Pneumonia    Seasonal allergies    Stroke ([place]) 2010   no residual   Thyroid disease    Uterine cancer [facility])    Past Surgical History:  Procedure Laterality Date   ABDOMINAL HYSTERECTOMY     BREAST LUMPECTOMY     left breast   CAROTID ENDARTERECTOMY Left June 10, 2008   CE   EYE SURGERY     cataracts removed bilaterally   TONSILLECTOMY      Allergies  Allergen Reactions   Sulfa Antibiotics Shortness Of Breath and Palpitations   Sulfamethoxazole Shortness Of Breath and Palpitations   Sulfur Shortness Of Breath and Palpitations    "Allergic," per MAR (although I believe they might've meant "Sulfa"??)  Amoxicillin Rash   Penicillin G Rash    Did it involve swelling of the face/tongue/throat, SOB, or low BP? Yes Did it involve sudden or severe rash/hives, skin peeling, or any reaction on the inside of your mouth or nose? Unk Did you need to seek medical attention at a hospital or doctor's office? Unk When did it last happen? "it was a long time ago" If all above answers are NO, may proceed with cephalosporin use.    Penicillins Rash    Did it involve swelling of the face/tongue/throat, SOB, or low BP?  Yes Did it involve sudden or severe rash/hives, skin peeling, or any reaction on the inside of your mouth or nose? Unk Did you need to seek medical attention at a hospital or doctor's office? Unk When did it last happen? "it was a long time ago" If all above answers are NO, may proceed with cephalosporin use.    Tape Other (See Comments)    SKIN IS VERY FRAGILE- PLEASE USE AN ALTERNATIVE TO TAPE, AS THE SKIN TEARS EASILY!!    Outpatient Encounter Medications as of 06/14/2018  Medication Sig   amiodarone (PACERONE) 200 MG tablet Take 200 mg by mouth daily.   apixaban (ELIQUIS) 2.5 MG TABS tablet Take 1 tablet (2.5 mg total) by mouth 2 (two) times daily.   diltiazem (CARDIZEM CD) 120 MG 24 hr capsule Take 1 capsule (120 mg total) by mouth daily.   ergocalciferol (VITAMIN D2) 1.25 MG (50000 UT) capsule Take 1,250 Units by mouth once a week. Once a day on Sunday   fluticasone (FLONASE) 50 MCG/ACT nasal spray Place 2 sprays into both nostrils daily.    Fluticasone-Salmeterol (ADVAIR) 250-50 MCG/DOSE AEPB Inhale 1 puff into the lungs every 12 (twelve) hours.   furosemide (LASIX) 40 MG tablet Take 40 mg by mouth daily.   ipratropium-albuterol (DUONEB) 0.5-2.5 (3) MG/3ML SOLN Take 3 mLs by nebulization every 6 (six) hours as needed (WHEEZING).   levothyroxine (SYNTHROID) 100 MCG tablet Take 100 mcg by mouth daily before breakfast.   metoprolol tartrate (LOPRESSOR) 25 MG tablet Take 12.5 mg by mouth 2 (two) times daily.   Multiple Vitamins-Minerals (CENTRUM SILVER 50+WOMEN) TABS Take 1 tablet by mouth daily with breakfast.   Multiple Vitamins-Minerals (ICAPS AREDS 2 PO) Take 1 tablet by mouth daily.   Nutritional Supplements (RESOURCE SUPPORT) LIQD Take 90 mLs by mouth every 6 (six) hours.    OXYGEN Inhale 2 L into the lungs continuous. SAT MUST BE >90%   pantoprazole (PROTONIX) 40 MG tablet Take 40 mg by mouth daily.    potassium chloride (KLOR-CON) 20 MEQ packet Take 20 mEq by  mouth 2 (two) times daily.   tiotropium (SPIRIVA) 18 MCG inhalation capsule Place 18 mcg into inhaler and inhale daily.   zinc oxide 20 % ointment Apply 1 application topically as needed for irritation. Apply to buttocks after every incontinent episode and as needed for redness   amiodarone (PACERONE) 400 MG tablet Take 1 tablet (400 mg total) by mouth daily for 27 days. (decrease to 200 mg daily on April 14th)   levothyroxine (SYNTHROID, LEVOTHROID) 100 MCG tablet Take 1 tablet (100 mcg total) by mouth daily at 6 (six) AM for 30 days.   [DISCONTINUED] furosemide (LASIX) 40 MG tablet Take 1 tablet (40 mg total) by mouth daily for 30 days.   [DISCONTINUED] saccharomyces boulardii (FLORASTOR) 250 MG capsule Take 250 mg by mouth 2 (two) times daily.   No facility-administered encounter medications on file  as of 06/14/2018.     Review of Systems  Constitutional: Negative.  Negative for fever.  HENT: Negative.   Respiratory: Positive for cough and shortness of breath.   Cardiovascular: Positive for leg swelling.  Gastrointestinal: Negative.   Genitourinary: Negative.   Musculoskeletal: Negative.   Skin: Negative.   Neurological: Positive for weakness.  Psychiatric/Behavioral: Negative.     Immunization History  Administered Date(s) Administered   Influenza-Unspecified 11/15/2013   PPD Test 07/24/2011   Zoster Recombinat (Shingrix) 12/10/2017   Pertinent  Health Maintenance Due  Topic Date Due   PNA vac Low Risk Adult (1 of 2 - PCV13) 10/02/1984   INFLUENZA VACCINE  09/16/2018   DEXA SCAN  Completed   Fall Risk  01/28/2014  Falls in the past year? Yes  Number falls in past yr: 1  Injury with Fall? No   Functional Status Survey:    Vitals:   06/14/18 1153  BP: (!) 97/52  Pulse: 98  Resp: 20  Temp: (!) 97.1 F (36.2 C)  SpO2: 95%  Weight: 98 lb 11.2 oz (44.8 kg)  Height: 4\' 10"  (1.473 m)   Body mass index is 20.63 kg/m. Physical Exam Vitals signs reviewed.   Constitutional:      Appearance: Normal appearance.  HENT:     Head: Normocephalic.     Nose: Nose normal.     Mouth/Throat:     Mouth: Mucous membranes are moist.     Pharynx: Oropharynx is clear.  Eyes:     Pupils: Pupils are equal, round, and reactive to light.  Neck:     Musculoskeletal: Neck supple.  Cardiovascular:     Rate and Rhythm: Normal rate and regular rhythm.     Pulses: Normal pulses.     Heart sounds: Normal heart sounds.  Pulmonary:     Comments: Has rales Bilateral at her bases Abdominal:     General: Abdomen is flat. Bowel sounds are normal.     Palpations: Abdomen is soft.  Musculoskeletal:     Comments: Mild swelling Bilateral  Skin:    General: Skin is warm and dry.  Neurological:     General: No focal deficit present.     Mental Status: She is alert and oriented to person, place, and time.  Psychiatric:        Mood and Affect: Mood normal.        Thought Content: Thought content normal.     Labs reviewed: Recent Labs    03/17/18 1753  05/01/18 0545 05/02/18 0402  05/21/18 0815 05/22/18 7989 05/23/18 0326 05/29/18 06/08/18  NA  --    < > 134* 133*   < > 138 137 136 138 137  K  --    < > 3.9 4.4   < > 3.8 3.3* 3.0* 4.8 4.8  CL  --    < > 95* 96*   < > 95* 93* 93*  --  98  CO2  --    < > 29 27   < > 33* 35* 33*  --  32  GLUCOSE  --    < > 140* 131*   < > 96 95 94  --   --   BUN  --    < > 34* 43*   < > 26* 26* 25* 37* 38*  CREATININE  --    < > 1.39* 1.50*   < > 1.48* 1.32* 1.17* 1.6* 1.5*  CALCIUM  --    < >  8.9 8.7*   < > 9.1 8.6* 8.5*  --  8.7  MG 1.7  --  2.0 1.9  --   --   --   --   --   --    < > = values in this interval not displayed.   Recent Labs    05/01/18 0545 05/02/18 0402 05/11/18 05/19/18 2000  AST 19 23 11* 19  ALT 19 24 12 19   ALKPHOS 62 56 56 79  BILITOT 0.5 0.6  --  0.7  PROT 6.3* 5.7*  --  6.3*  ALBUMIN 3.0* 2.9*  --  3.1*   Recent Labs    03/17/18 1604  05/02/18 0402 05/11/18 05/19/18 2000 05/21/18 0815  05/29/18  WBC 17.1*   < > 9.2 12.1 10.0 12.0*  --   NEUTROABS 12.8*  --   --   --  7.9* 9.1*  --   HGB 11.3*   < > 9.9* 10.8* 11.2* 10.4* 9.6*  HCT 34.5*   < > 30.2* 32* 35.8* 31.9* 29*  MCV 93.8   < > 93.8  --  94.7 94.4  --   PLT 253   < > 348 282 248 289 339   < > = values in this interval not displayed.   Lab Results  Component Value Date   TSH 10.029 (H) 04/27/2018   No results found for: HGBA1C No results found for: CHOL, HDL, LDLCALC, LDLDIRECT, TRIG, CHOLHDL  Significant Diagnostic Results in last 30 days:  Dg Chest Port 1 View  Result Date: 05/22/2018 CLINICAL DATA:  Shortness of breath EXAM: PORTABLE CHEST 1 VIEW COMPARISON:  05/19/2018 FINDINGS: Small bilateral pleural effusions. Bilateral interstitial and alveolar airspace opacities worse in the right lung. No pneumothorax. Stable cardiomediastinal silhouette. No acute osseous abnormality. IMPRESSION: Small bilateral pleural effusions with bilateral interstitial and alveolar airspace opacities. Differential considerations include pulmonary edema versus multilobar pneumonia. Electronically Signed   By: Kathreen Devoid   On: 05/22/2018 08:05   Dg Chest Portable 1 View  Result Date: 05/19/2018 CLINICAL DATA:  Evaluate for post thoracentesis. EXAM: PORTABLE CHEST 1 VIEW COMPARISON:  05/19/2018 FINDINGS: Lungs are adequately inflated demonstrate moderate interval improvement in the right-sided pleural effusion as there is a small amount of residual right pleural fluid likely with associated basilar atelectasis. Moderate left pleural effusion slightly worse. Likely associated left basilar atelectasis. No pneumothorax. Prominence of the perihilar markings suggesting mild interstitial edema and less likely infection. Mild stable cardiomegaly. Remainder of the exam is unchanged. IMPRESSION: Moderate improvement and right-sided pleural effusion with small residual right effusion likely with associated basilar atelectasis. No pneumothorax. Slight  worsening moderate size left pleural effusion likely with associated basilar atelectasis. Mild cardiomegaly with suggestion of interstitial edema and less likely infection. Electronically Signed   By: Marin Olp M.D.   On: 05/19/2018 21:15   Dg Chest Portable 1 View  Result Date: 05/19/2018 CLINICAL DATA:  Shortness of breath EXAM: PORTABLE CHEST 1 VIEW COMPARISON:  05/02/2018 FINDINGS: Diffuse bilateral interstitial and patchy alveolar airspace opacities. Small bilateral pleural effusions right greater than left. No pneumothorax. Stable cardiomegaly. No acute osseous abnormality. IMPRESSION: Stable cardiomegaly with bilateral interstitial and alveolar airspace opacities with pleural effusions. Differential considerations include pulmonary edema versus multilobar pneumonia. Electronically Signed   By: Kathreen Devoid   On: 05/19/2018 19:10    Assessment/Plan  Acute on chronic diastolic (congestive) heart failure  Though She has gained only few pounds Patient gets volume overload very easily She continues  to have Pleural effusion  Will start her on Torsemide 20 mg 3 times a week.  Continue on lasix also FR to 1500 cc/24 hours Continues to be very frail Continue to monitor her weights Repeat BMP in 2 week Pleural effusion due to CHF  S/P Thoracocentesis in hospital Repeat Chest Xray pending With Possible suspicious Upper right Lobe nodule ? Bronchogenic carcinoma Patient is aware and does not want any treatment right now Chronic obstructive pulmonary disease,  Continue on Spiriva and Advair On 3-4 l of oxygen Hypothyroidism  Dose was increased in 03/20 Repeat TSH in 8 weeks  CKD (chronic kidney disease) stage 3,  Last creat was at baseline BUN was slightly up at 38 Repeat BMP on toresimide   Atrial Fibrillation On amiodarone and Eliquis Also she is on Cardizem and low dose of Lopressor Anemia Hgb is low but stable  Family/ staff Communication:   Labs/tests ordered:  BMP

## 2018-06-19 ENCOUNTER — Encounter: Payer: Self-pay | Admitting: Family

## 2018-06-19 ENCOUNTER — Non-Acute Institutional Stay (SKILLED_NURSING_FACILITY): Payer: Medicare Other | Admitting: Family

## 2018-06-19 DIAGNOSIS — I5032 Chronic diastolic (congestive) heart failure: Secondary | ICD-10-CM | POA: Diagnosis not present

## 2018-06-19 DIAGNOSIS — E032 Hypothyroidism due to medicaments and other exogenous substances: Secondary | ICD-10-CM

## 2018-06-19 DIAGNOSIS — I10 Essential (primary) hypertension: Secondary | ICD-10-CM | POA: Diagnosis not present

## 2018-06-19 DIAGNOSIS — I4819 Other persistent atrial fibrillation: Secondary | ICD-10-CM | POA: Diagnosis not present

## 2018-06-19 DIAGNOSIS — J449 Chronic obstructive pulmonary disease, unspecified: Secondary | ICD-10-CM

## 2018-06-19 DIAGNOSIS — K219 Gastro-esophageal reflux disease without esophagitis: Secondary | ICD-10-CM | POA: Diagnosis not present

## 2018-06-19 NOTE — Progress Notes (Signed)
Location:  [place] Room Number: 109 Place of Service:  SNF (31) Provider:  Kelby Fam, Lenda Baratta NP  [name], MD  Patient Care Team: [name], MD as PCP - General (Internal Medicine) Pixie Casino, MD as PCP - Cardiology (Cardiology) Nicholas Lose, MD as Consulting Physician (Hematology and Oncology)  Extended Emergency Contact Information Primary Emergency Contact: Little Ishikawa Mobile Phone: [phone] Relation: Niece Secondary Emergency Contact: Cromer,Chad Mobile Phone: [phone] Relation: Nephew  Code Status:  DNR Goals of care: Advanced Directive information Advanced Directives 06/19/2018  Does Patient Have a Medical Advance Directive? Yes  Type of Paramedic of [place];Out of facility DNR (pink MOST or yellow form)  Does patient want to make changes to medical advance directive? No - Patient declined  Copy of [place] in Chart? Yes - validated most recent copy scanned in chart (See row information)  Would patient like information on creating a medical advance directive? No - Patient declined  Pre-existing out of facility DNR order (yellow form or pink MOST form) Yellow form placed in chart (order not valid for inpatient use);Pink MOST form placed in chart (order not valid for inpatient use)     Chief Complaint  Patient presents with  . Medical Management of Chronic Issues    HPI:  Pt is a 85 y.o. female seen today for medical management of chronic diseases.she has a medical history of Hypertension,chronic diastolic congestive heart failure,Hy[perlipidemia ,COPD on oxygen ,persistent Afib,hypothyroidism,GERD,CKD stage 3, right upper lobe lung nodule per CT scan 04/29/18 with mediastinal lymphadenopathy 2.5 cm short axis node in the right paratracheal region among other conditions.she denies any acute issues today.she continues to work with Physical/OT  Therapy 5 X per week for bed mobility,gait  with walker cues for safety and bilateral lower extremities strengthening.she states gets shortness of breath with exertion requiring to stop for few minutes then proceeds with walking.she has had no recent fall episodes.she continues to require 4 liters of continuous oxygen via nasal cannula.she has had a two pound weight gain over one month.no worsening edema.           Past Medical History:  Diagnosis Date  . Arthritis   . Constipation   . COPD (chronic obstructive pulmonary disease) ([place])   . Fatigue   . Frequent UTI   . GERD (gastroesophageal reflux disease)   . History of shingles 2007  . Hyperlipemia   . Hypertension   . Hypothyroidism   . Multiple allergies   . Osteoporosis   . Paget's carcinoma of the nipple ([place] [facility]) 03/16/2013  . Pneumonia   . Seasonal allergies   . Stroke [facility]) 2010   no residual  . Thyroid disease   . Uterine cancer [facility])    Past Surgical History:  Procedure Laterality Date  . ABDOMINAL HYSTERECTOMY    . BREAST LUMPECTOMY     left breast  . CAROTID ENDARTERECTOMY Left June 10, 2008   CE  . EYE SURGERY     cataracts removed bilaterally  . TONSILLECTOMY      Allergies  Allergen Reactions  . Sulfa Antibiotics Shortness Of Breath and Palpitations  . Sulfamethoxazole Shortness Of Breath and Palpitations  . Sulfur Shortness Of Breath and Palpitations    "Allergic," per MAR (although I believe they might've meant "Sulfa"??)  . Amoxicillin Rash  . Penicillin G Rash    Did it involve swelling of the face/tongue/throat, SOB, or low BP? Yes Did it involve  sudden or severe rash/hives, skin peeling, or any reaction on the inside of your mouth or nose? Unk Did you need to seek medical attention at a hospital or doctor's office? Unk When did it last happen? "it was a long time ago" If all above answers are "NO", may proceed with cephalosporin use.   Marland Kitchen Penicillins Rash    Did it involve swelling of the face/tongue/throat, SOB, or low BP? Yes Did it  involve sudden or severe rash/hives, skin peeling, or any reaction on the inside of your mouth or nose? Unk Did you need to seek medical attention at a hospital or doctor's office? Unk When did it last happen? "it was a long time ago" If all above answers are "NO", may proceed with cephalosporin use.   . Tape Other (See Comments)    SKIN IS VERY FRAGILE- PLEASE USE AN ALTERNATIVE TO TAPE, AS THE SKIN TEARS EASILY!!    Outpatient Encounter Medications as of 06/19/2018  Medication Sig  . amiodarone (PACERONE) 200 MG tablet Take 200 mg by mouth daily.  Marland Kitchen apixaban (ELIQUIS) 2.5 MG TABS tablet Take 1 tablet (2.5 mg total) by mouth 2 (two) times daily.  Marland Kitchen diltiazem (CARDIZEM CD) 120 MG 24 hr capsule Take 1 capsule (120 mg total) by mouth daily.  . ergocalciferol (VITAMIN D2) 1.25 MG (50000 UT) capsule Take 1,250 Units by mouth once a week. Once a day on Sunday  . fluticasone (FLONASE) 50 MCG/ACT nasal spray Place 2 sprays into both nostrils daily.   . Fluticasone-Salmeterol (ADVAIR) 250-50 MCG/DOSE AEPB Inhale 1 puff into the lungs every 12 (twelve) hours.  . furosemide (LASIX) 40 MG tablet Take 40 mg by mouth daily.  Marland Kitchen ipratropium-albuterol (DUONEB) 0.5-2.5 (3) MG/3ML SOLN Take 3 mLs by nebulization every 6 (six) hours as needed (WHEEZING).  Marland Kitchen levothyroxine (SYNTHROID) 100 MCG tablet Take 100 mcg by mouth daily before breakfast.  . metoprolol tartrate (LOPRESSOR) 25 MG tablet Take 12.5 mg by mouth 2 (two) times daily.  . Multiple Vitamins-Minerals (CENTRUM SILVER 50+WOMEN) TABS Take 1 tablet by mouth daily with breakfast.  . Multiple Vitamins-Minerals (ICAPS AREDS 2 PO) Take 1 tablet by mouth daily.  . Nutritional Supplements (RESOURCE SUPPORT) LIQD Take 90 mLs by mouth 3 (three) times daily between meals.   . OXYGEN Inhale 2 L into the lungs continuous. SAT MUST BE >90%  . pantoprazole (PROTONIX) 40 MG tablet Take 40 mg by mouth daily.   . potassium chloride (KLOR-CON) 20 MEQ packet Take 20 mEq  by mouth 2 (two) times daily.  Marland Kitchen tiotropium (SPIRIVA) 18 MCG inhalation capsule Place 18 mcg into inhaler and inhale daily.  Marland Kitchen torsemide (DEMADEX) 20 MG tablet Take 20 mg by mouth daily. On Monday, Wednesday, and Friday  . zinc oxide 20 % ointment Apply 1 application topically as needed for irritation. Apply to buttocks after every incontinent episode and as needed for redness  . [DISCONTINUED] amiodarone (PACERONE) 400 MG tablet Take 1 tablet (400 mg total) by mouth daily for 27 days. (decrease to 200 mg daily on April 14th)   No facility-administered encounter medications on file as of 06/19/2018.     Review of Systems  Constitutional: Positive for chills. Negative for activity change, appetite change, fatigue and fever.  HENT: Positive for hearing loss. Negative for congestion, rhinorrhea, sinus pressure, sinus pain, sneezing, sore throat and trouble swallowing.   Eyes: Positive for visual disturbance. Negative for discharge, redness and itching.       Wears eye glasses  Respiratory: Negative for cough, chest tightness, shortness of breath and wheezing.   Cardiovascular: Negative for chest pain and leg swelling.       Persistent Afib no worsening palpitation   Gastrointestinal: Negative for abdominal distention, abdominal pain, constipation, diarrhea, nausea and vomiting.  Endocrine: Negative for cold intolerance, heat intolerance, polydipsia, polyphagia and polyuria.  Genitourinary: Negative for difficulty urinating, dysuria, flank pain, frequency and urgency.  Musculoskeletal: Positive for gait problem. Negative for arthralgias.  Skin: Negative for color change, pallor and rash.       Foam dressing to mid spine for extra cushioning and protection   Neurological: Negative for dizziness, light-headedness and headaches.  Hematological: Does not bruise/bleed easily.  Psychiatric/Behavioral: Negative for agitation, confusion and sleep disturbance. The patient is not nervous/anxious.      Immunization History  Administered Date(s) Administered  . Influenza-Unspecified 11/15/2013  . PPD Test 07/24/2011  . Zoster Recombinat (Shingrix) 12/10/2017   Pertinent  Health Maintenance Due  Topic Date Due  . PNA vac Low Risk Adult (1 of 2 - PCV13) 10/02/1984  . INFLUENZA VACCINE  09/16/2018  . DEXA SCAN  Completed   Fall Risk  01/28/2014  Falls in the past year? Yes  Number falls in past yr: 1  Injury with Fall? No   Functional Status Survey:    Vitals:   06/19/18 1039  BP: (!) 86/57  Pulse: 90  Resp: 18  Temp: 98.1 F (36.7 C)  SpO2: 91%  Weight: 106 lb (48.1 kg)  Height: 4\' 10"  (1.473 m)   Body mass index is 22.15 kg/m. Physical Exam Vitals signs and nursing note reviewed.  Constitutional:      General: She is not in acute distress.    Appearance: She is not ill-appearing.  HENT:     Head: Normocephalic.     Right Ear: Tympanic membrane, ear canal and external ear normal. There is no impacted cerumen.     Left Ear: Tympanic membrane, ear canal and external ear normal. There is no impacted cerumen.     Nose: Nose normal. No congestion or rhinorrhea.     Mouth/Throat:     Mouth: Mucous membranes are moist.     Pharynx: Oropharynx is clear. No oropharyngeal exudate or posterior oropharyngeal erythema.  Eyes:     General: No scleral icterus.       Right eye: No discharge.        Left eye: No discharge.     Conjunctiva/sclera: Conjunctivae normal.     Pupils: Pupils are equal, round, and reactive to light.  Neck:     Musculoskeletal: Normal range of motion. No neck rigidity or muscular tenderness.  Cardiovascular:     Rate and Rhythm: Normal rate. Rhythm irregular.     Pulses: Normal pulses.     Heart sounds: Murmur present. No friction rub. No gallop.   Pulmonary:     Effort: Pulmonary effort is normal. No respiratory distress.     Breath sounds: No wheezing, rhonchi or rales.     Comments: Bilateral lung Poor air entry.on continuous oxygen 4 liters  via Nasal cannula  Chest:     Chest wall: No tenderness.  Abdominal:     General: Bowel sounds are normal. There is no distension.     Palpations: Abdomen is soft. There is no mass.     Tenderness: There is no abdominal tenderness. There is no right CVA tenderness, left CVA tenderness, guarding or rebound.  Musculoskeletal:  General: No tenderness.     Right lower leg: No edema.     Left lower leg: No edema.     Comments: Unsteady gait ambulates with walker with assistance.spends most time on wheelchair.knee high ted hose in place   Lymphadenopathy:     Cervical: No cervical adenopathy.  Skin:    General: Skin is warm and dry.     Coloration: Skin is not pale.     Findings: No bruising or rash.     Comments: Mid spine skin redness.foam dressing in place.   Neurological:     Mental Status: She is alert and oriented to person, place, and time.     Sensory: No sensory deficit.     Coordination: Coordination normal.     Gait: Gait abnormal.  Psychiatric:        Mood and Affect: Mood normal.        Behavior: Behavior normal.        Thought Content: Thought content normal.        Judgment: Judgment normal.     Labs reviewed: Recent Labs    03/17/18 1753  05/01/18 0545 05/02/18 0402  05/21/18 0815 05/22/18 5631 05/23/18 0326 05/29/18 06/08/18  NA  --    < > 134* 133*   < > 138 137 136 138 137  K  --    < > 3.9 4.4   < > 3.8 3.3* 3.0* 4.8 4.8  CL  --    < > 95* 96*   < > 95* 93* 93*  --  98  CO2  --    < > 29 27   < > 33* 35* 33*  --  32  GLUCOSE  --    < > 140* 131*   < > 96 95 94  --   --   BUN  --    < > 34* 43*   < > 26* 26* 25* 37* 38*  CREATININE  --    < > 1.39* 1.50*   < > 1.48* 1.32* 1.17* 1.6* 1.5*  CALCIUM  --    < > 8.9 8.7*   < > 9.1 8.6* 8.5*  --  8.7  MG 1.7  --  2.0 1.9  --   --   --   --   --   --    < > = values in this interval not displayed.   Recent Labs    05/01/18 0545 05/02/18 0402 05/11/18 05/19/18 2000  AST 19 23 11* 19  ALT 19 24 12  19   ALKPHOS 62 56 56 79  BILITOT 0.5 0.6  --  0.7  PROT 6.3* 5.7*  --  6.3*  ALBUMIN 3.0* 2.9*  --  3.1*   Recent Labs    03/17/18 1604  05/02/18 0402 05/11/18 05/19/18 2000 05/21/18 0815 05/29/18  WBC 17.1*   < > 9.2 12.1 10.0 12.0*  --   NEUTROABS 12.8*  --   --   --  7.9* 9.1*  --   HGB 11.3*   < > 9.9* 10.8* 11.2* 10.4* 9.6*  HCT 34.5*   < > 30.2* 32* 35.8* 31.9* 29*  MCV 93.8   < > 93.8  --  94.7 94.4  --   PLT 253   < > 348 282 248 289 339   < > = values in this interval not displayed.   Lab Results  Component Value Date   TSH 10.029 (H) 04/27/2018  Significant Diagnostic Results in last 30 days:  Dg Chest Port 1 View  Result Date: 05/22/2018 CLINICAL DATA:  Shortness of breath EXAM: PORTABLE CHEST 1 VIEW COMPARISON:  05/19/2018 FINDINGS: Small bilateral pleural effusions. Bilateral interstitial and alveolar airspace opacities worse in the right lung. No pneumothorax. Stable cardiomediastinal silhouette. No acute osseous abnormality. IMPRESSION: Small bilateral pleural effusions with bilateral interstitial and alveolar airspace opacities. Differential considerations include pulmonary edema versus multilobar pneumonia. Electronically Signed   By: Kathreen Devoid   On: 05/22/2018 08:05    Assessment/Plan 1. Essential hypertension B/p reviewed reading stable.continue on metoprolol 12.5 mg tablet twice daily,Torsemide 20 mg tablet daily and diltiazem 120 mg capsule daily.latest CR 1.5 at her baseline.    2. Chronic diastolic congestive heart failure (H[facility]) No abrupt weight gain or worsening edema.still reports shortness of breath with exertion.continue on metoprolol 12.5 mg tablet twice daily,Torsemide 20 mg tablet daily and diltiazem 120 mg capsule daily.on Potassium 20 meq tablet twice daily. - continue with fluid restrictions 1500 cc per day with NAD diet.  - continue to monitor for weight gain.  3. Chronic obstructive pulmonary disease, unspecified COPD type ([place])  Breathing stable.Presence of lung nodule with pleural effusion per CT scan 04/29/2018 as above.continue on Duoneb and Spiriva.    4. Other persistent atrial fibrillation HR irregular.continue on apixaban 2.5 mg tablet twice daily.follw up with cardiology as directed.   5. Hypothyroidism due to medication Lab Results  Component Value Date   TSH 10.029 (H) 04/27/2018  Continue on Levothyroxine 100 mcg tablet daily before breakfast.TSH level lab pending.   6. Gastroesophageal reflux disease without esophagitis Stable.continue on on Protonix 40 mg tablet daily.  Family/ staff Communication: Reviewed plan of care with patient and facility Nurse.   Labs/tests ordered: None

## 2018-06-23 DIAGNOSIS — R05 Cough: Secondary | ICD-10-CM | POA: Diagnosis not present

## 2018-06-26 LAB — TSH: TSH: 1.16 (ref 0.41–5.90)

## 2018-06-27 ENCOUNTER — Encounter: Payer: Self-pay | Admitting: Nurse Practitioner

## 2018-06-27 ENCOUNTER — Non-Acute Institutional Stay (SKILLED_NURSING_FACILITY): Payer: Medicare Other | Admitting: Nurse Practitioner

## 2018-06-27 DIAGNOSIS — I5032 Chronic diastolic (congestive) heart failure: Secondary | ICD-10-CM | POA: Diagnosis not present

## 2018-06-27 DIAGNOSIS — I4819 Other persistent atrial fibrillation: Secondary | ICD-10-CM

## 2018-06-27 DIAGNOSIS — I10 Essential (primary) hypertension: Secondary | ICD-10-CM | POA: Diagnosis not present

## 2018-06-27 DIAGNOSIS — J449 Chronic obstructive pulmonary disease, unspecified: Secondary | ICD-10-CM

## 2018-06-28 ENCOUNTER — Encounter: Payer: Self-pay | Admitting: Nurse Practitioner

## 2018-06-28 NOTE — Progress Notes (Signed)
Location:   [facility] Room Number: 54 Place of Service:  SNF (31) Provider: Lennie Odor Shi Blankenship NP  [name], MD  Patient Care Team: [name], MD as PCP - General (Internal Medicine) Debara Pickett Nadean Corwin, MD as PCP - Cardiology (Cardiology) Nicholas Lose, MD as Consulting Physician (Hematology and Oncology)  Extended Emergency Contact Information Primary Emergency Contact: Little Ishikawa Mobile Phone: [phone] Relation: Niece Secondary Emergency Contact: Cromer,Chad Mobile Phone: [phone] Relation: Nephew  Code Status:  DNR Goals of care: Advanced Directive information Advanced Directives 06/27/2018  Does Patient Have a Medical Advance Directive? Yes  Type of Paramedic of [place];Out of facility DNR (pink MOST or yellow form)  Does patient want to make changes to medical advance directive? No - Patient declined  Copy of [place] in Chart? No - copy requested  Would patient like information on creating a medical advance directive? No - Patient declined  Pre-existing out of facility DNR order (yellow form or pink MOST form) Yellow form placed in chart (order not valid for inpatient use);Pink MOST form placed in chart (order not valid for inpatient use)     Chief Complaint  Patient presents with  . Acute Visit    low blood pressure     HPI:  Pt is a 85 y.o. female seen today for low blood pressure measurements,  95/52, 86/57, 97/52, 90/55, 92/38, 90/66. On Metoprolol Tartrate 12.5mg  bid, Diltiazem 120mg  qd. Hx of CHF, chronic DOE, trace edema BLE, crackles in lungs, on Furosemide 40mg  qd, Torsemide 20mg  qd. Afib, heart rate is in control, on Amiodarone 200mg  qd. COPD, on Spiriva, Advair for maintenance.     Past Medical History:  Diagnosis Date  . Arthritis   . Constipation   . COPD (chronic obstructive pulmonary disease) ([place])   . Fatigue   . Frequent UTI   . GERD (gastroesophageal reflux disease)   .  History of shingles 2007  . Hyperlipemia   . Hypertension   . Hypothyroidism   . Multiple allergies   . Osteoporosis   . Paget's carcinoma of the nipple ([place]) 03/16/2013  . Pneumonia   . Seasonal allergies   . Stroke [facility]) 2010   no residual  . Thyroid disease   . Uterine cancer [facility])    Past Surgical History:  Procedure Laterality Date  . ABDOMINAL HYSTERECTOMY    . BREAST LUMPECTOMY     left breast  . CAROTID ENDARTERECTOMY Left June 10, 2008   CE  . EYE SURGERY     cataracts removed bilaterally  . TONSILLECTOMY      Allergies  Allergen Reactions  . Sulfa Antibiotics Shortness Of Breath and Palpitations  . Sulfamethoxazole Shortness Of Breath and Palpitations  . Sulfur Shortness Of Breath and Palpitations    "Allergic," per MAR (although I believe they might've meant "Sulfa"??)  . Amoxicillin Rash  . Penicillin G Rash    Did it involve swelling of the face/tongue/throat, SOB, or low BP? Yes Did it involve sudden or severe rash/hives, skin peeling, or any reaction on the inside of your mouth or nose? Unk Did you need to seek medical attention at a hospital or doctor's office? Unk When did it last happen? "it was a long time ago" If all above answers are "NO", may proceed with cephalosporin use.   Marland Kitchen Penicillins Rash    Did it involve swelling of the face/tongue/throat, SOB, or low BP? Yes Did it involve sudden or severe  rash/hives, skin peeling, or any reaction on the inside of your mouth or nose? Unk Did you need to seek medical attention at a hospital or doctor's office? Unk When did it last happen? "it was a long time ago" If all above answers are "NO", may proceed with cephalosporin use.   . Tape Other (See Comments)    SKIN IS VERY FRAGILE- PLEASE USE AN ALTERNATIVE TO TAPE, AS THE SKIN TEARS EASILY!!    Allergies as of 06/27/2018      Reactions   Sulfa Antibiotics Shortness Of Breath, Palpitations   Sulfamethoxazole Shortness Of Breath, Palpitations    Sulfur Shortness Of Breath, Palpitations   "Allergic," per MAR (although I believe they might've meant "Sulfa"??)   Amoxicillin Rash   Penicillin G Rash   Did it involve swelling of the face/tongue/throat, SOB, or low BP? Yes Did it involve sudden or severe rash/hives, skin peeling, or any reaction on the inside of your mouth or nose? Unk Did you need to seek medical attention at a hospital or doctor's office? Unk When did it last happen? "it was a long time ago" If all above answers are "NO", may proceed with cephalosporin use.   Penicillins Rash   Did it involve swelling of the face/tongue/throat, SOB, or low BP? Yes Did it involve sudden or severe rash/hives, skin peeling, or any reaction on the inside of your mouth or nose? Unk Did you need to seek medical attention at a hospital or doctor's office? Unk When did it last happen? "it was a long time ago" If all above answers are "NO", may proceed with cephalosporin use.   Tape Other (See Comments)   SKIN IS VERY FRAGILE- PLEASE USE AN ALTERNATIVE TO TAPE, AS THE SKIN TEARS EASILY!!      Medication List       Accurate as of Jun 27, 2018 11:59 PM. If you have any questions, ask your nurse or doctor.        STOP taking these medications   levothyroxine 100 MCG tablet Commonly known as:  SYNTHROID Stopped by:  Kjersten Ormiston X Sumit Branham, NP     TAKE these medications   amiodarone 200 MG tablet Commonly known as:  PACERONE Take 200 mg by mouth daily.   apixaban 2.5 MG Tabs tablet Commonly known as:  ELIQUIS Take 1 tablet (2.5 mg total) by mouth 2 (two) times daily.   diltiazem 120 MG 24 hr capsule Commonly known as:  CARDIZEM CD Take 1 capsule (120 mg total) by mouth daily.   DOXYCYCLINE HYCLATE PO Take 100 mg by mouth 2 (two) times a day. For 10 days   ergocalciferol 1.25 MG (50000 UT) capsule Commonly known as:  VITAMIN D2 Take 1,250 Units by mouth once a week. Once a day on Sunday   fluticasone 50 MCG/ACT nasal spray Commonly  known as:  FLONASE Place 2 sprays into both nostrils daily.   Fluticasone-Salmeterol 250-50 MCG/DOSE Aepb Commonly known as:  ADVAIR Inhale 1 puff into the lungs every 12 (twelve) hours.   furosemide 40 MG tablet Commonly known as:  LASIX Take 40 mg by mouth daily.   furosemide 40 MG tablet Commonly known as:  LASIX Take 40 mg by mouth daily.   ICAPS AREDS 2 PO Take 1 tablet by mouth daily.   Centrum Silver 50+Women Tabs Take 1 tablet by mouth daily with breakfast.   ipratropium-albuterol 0.5-2.5 (3) MG/3ML Soln Commonly known as:  DUONEB Take 3 mLs by nebulization every 6 (six) hours  as needed (WHEEZING).   metoprolol tartrate 25 MG tablet Commonly known as:  LOPRESSOR Take 12.5 mg by mouth 2 (two) times daily.   OXYGEN Inhale 2 L into the lungs continuous. SAT MUST BE >90%   pantoprazole 40 MG tablet Commonly known as:  PROTONIX Take 40 mg by mouth daily.   potassium chloride 20 MEQ packet Commonly known as:  KLOR-CON Take 20 mEq by mouth 2 (two) times daily.   Resource Support Liqd Take 90 mLs by mouth 3 (three) times daily between meals.   saccharomyces boulardii 250 MG capsule Commonly known as:  FLORASTOR Take 250 mg by mouth 2 (two) times daily.   tiotropium 18 MCG inhalation capsule Commonly known as:  SPIRIVA Place 18 mcg into inhaler and inhale daily.   torsemide 20 MG tablet Commonly known as:  DEMADEX Take 20 mg by mouth daily. On Monday, Wednesday, and Friday   zinc oxide 20 % ointment Apply 1 application topically as needed for irritation. Apply to buttocks after every incontinent episode and as needed for redness      ROS was provided with assistance of staff.  Review of Systems  Constitutional: Negative for activity change, appetite change, chills, diaphoresis, fatigue and fever.  HENT: Positive for hearing loss. Negative for congestion and voice change.   Respiratory: Positive for shortness of breath. Negative for cough and wheezing.         DOE  Cardiovascular: Positive for leg swelling. Negative for chest pain and palpitations.  Gastrointestinal: Negative for abdominal distention, abdominal pain, constipation, diarrhea, nausea and vomiting.  Genitourinary: Negative for difficulty urinating, dysuria and urgency.  Musculoskeletal: Positive for gait problem.  Skin: Negative for color change and pallor.  Neurological: Negative for dizziness, syncope, speech difficulty, weakness, light-headedness and headaches.       Memory lapses.   Psychiatric/Behavioral: Negative for agitation, behavioral problems, hallucinations and sleep disturbance. The patient is not nervous/anxious.     Immunization History  Administered Date(s) Administered  . Influenza-Unspecified 11/15/2013  . PPD Test 07/24/2011  . Zoster Recombinat (Shingrix) 12/10/2017   Pertinent  Health Maintenance Due  Topic Date Due  . PNA vac Low Risk Adult (1 of 2 - PCV13) 10/02/1984  . INFLUENZA VACCINE  09/16/2018  . DEXA SCAN  Completed   Fall Risk  01/28/2014  Falls in the past year? Yes  Number falls in past yr: 1  Injury with Fall? No   Functional Status Survey:    Vitals:   06/27/18 1640  BP: 96/60  Pulse: 72  Resp: 18  Temp: (!) 97.2 F (36.2 C)  SpO2: (!) 84%  Weight: 94 lb 14.4 oz (43 kg)  Height: 4\' 10"  (1.473 m)   Body mass index is 19.83 kg/m. Physical Exam Vitals signs and nursing note reviewed.  Constitutional:      General: She is not in acute distress.    Appearance: Normal appearance. She is not ill-appearing, toxic-appearing or diaphoretic.  HENT:     Head: Normocephalic and atraumatic.     Nose: Nose normal. No congestion or rhinorrhea.     Mouth/Throat:     Mouth: Mucous membranes are moist.  Eyes:     Extraocular Movements: Extraocular movements intact.     Conjunctiva/sclera: Conjunctivae normal.     Pupils: Pupils are equal, round, and reactive to light.  Neck:     Musculoskeletal: Normal range of motion and neck  supple.  Cardiovascular:     Rate and Rhythm: Normal rate. Rhythm irregular.  Heart sounds: Murmur present.  Pulmonary:     Breath sounds: Rales present. No wheezing or rhonchi.     Comments: O2 dependent, decreased air entry R+L lungs, bibasilar rales.  Abdominal:     General: There is no distension.     Palpations: Abdomen is soft.     Tenderness: There is no abdominal tenderness. There is no right CVA tenderness, left CVA tenderness, guarding or rebound.  Musculoskeletal:     Right lower leg: Edema present.     Left lower leg: Edema present.     Comments: Trace edema BLE. The patient was examined in bed. Overlapped 1st and 2nd toes R+L  Skin:    General: Skin is warm and dry.  Neurological:     General: No focal deficit present.     Mental Status: She is alert. Mental status is at baseline.     Comments: Oriented to person and place.   Psychiatric:        Mood and Affect: Mood normal.        Behavior: Behavior normal.        Thought Content: Thought content normal.        Judgment: Judgment normal.     Labs reviewed: Recent Labs    03/17/18 1753  05/01/18 0545 05/02/18 0402  05/21/18 0815 05/22/18 0092 05/23/18 0326 05/29/18 06/08/18  NA  --    < > 134* 133*   < > 138 137 136 138 137  K  --    < > 3.9 4.4   < > 3.8 3.3* 3.0* 4.8 4.8  CL  --    < > 95* 96*   < > 95* 93* 93*  --  98  CO2  --    < > 29 27   < > 33* 35* 33*  --  32  GLUCOSE  --    < > 140* 131*   < > 96 95 94  --   --   BUN  --    < > 34* 43*   < > 26* 26* 25* 37* 38*  CREATININE  --    < > 1.39* 1.50*   < > 1.48* 1.32* 1.17* 1.6* 1.5*  CALCIUM  --    < > 8.9 8.7*   < > 9.1 8.6* 8.5*  --  8.7  MG 1.7  --  2.0 1.9  --   --   --   --   --   --    < > = values in this interval not displayed.   Recent Labs    05/01/18 0545 05/02/18 0402 05/11/18 05/19/18 2000  AST 19 23 11* 19  ALT 19 24 12 19   ALKPHOS 62 56 56 79  BILITOT 0.5 0.6  --  0.7  PROT 6.3* 5.7*  --  6.3*  ALBUMIN 3.0* 2.9*  --  3.1*    Recent Labs    03/17/18 1604  05/02/18 0402 05/11/18 05/19/18 2000 05/21/18 0815 05/29/18  WBC 17.1*   < > 9.2 12.1 10.0 12.0*  --   NEUTROABS 12.8*  --   --   --  7.9* 9.1*  --   HGB 11.3*   < > 9.9* 10.8* 11.2* 10.4* 9.6*  HCT 34.5*   < > 30.2* 32* 35.8* 31.9* 29*  MCV 93.8   < > 93.8  --  94.7 94.4  --   PLT 253   < > 348 282 248 289 339   < > =  values in this interval not displayed.   Lab Results  Component Value Date   TSH 10.029 (H) 04/27/2018   No results found for: HGBA1C No results found for: CHOL, HDL, LDLCALC, LDLDIRECT, TRIG, CHOLHDL  Significant Diagnostic Results in last 30 days:  No results found.  Assessment/Plan Hypertension 06/27/18 low blood pressure measurements: 95/52, 86/57, 97/52, 90/55, 92/38, 90/66. Will dc Metoprolol Tartrate 12.5mg  bid, starts Metoprolol Succinate 12.5mg  qd. Continue Diltiazem 120mg  qd.  Observe.    Atrial fibrillation (HCC) Heart rate is in control, continue Amiodarone 200mg  qd.   Diastolic CHF (HCC) Chronic DOE, trace edema BLE, bibasilar rales. Continue Furosemide 40mg  qd, Torsemide 20mg  qd.   COPD (chronic obstructive pulmonary disease) (HCC) Chronic DOE, cough, continue Spiriva, Advair.    Family/ staff Communication: plan of care reviewed with the patient and charge nurse.   Labs/tests ordered: none  Time spend 25 minutes.

## 2018-06-28 NOTE — Assessment & Plan Note (Signed)
Chronic DOE, trace edema BLE, bibasilar rales. Continue Furosemide 40mg  qd, Torsemide 20mg  qd.

## 2018-06-28 NOTE — Assessment & Plan Note (Signed)
06/27/18 low blood pressure measurements: 95/52, 86/57, 97/52, 90/55, 92/38, 90/66. Will dc Metoprolol Tartrate 12.5mg  bid, starts Metoprolol Succinate 12.5mg  qd. Continue Diltiazem 120mg  qd.  Observe.

## 2018-06-28 NOTE — Assessment & Plan Note (Signed)
Heart rate is in control, continue Amiodarone 200mg  qd.

## 2018-06-28 NOTE — Assessment & Plan Note (Signed)
Chronic DOE, cough, continue Spiriva, Advair.

## 2018-06-29 LAB — BASIC METABOLIC PANEL
BUN: 45 — AB (ref 4–21)
Creatinine: 1.9 — AB (ref 0.5–1.1)
Glucose: 87
Potassium: 4.4 (ref 3.4–5.3)
Sodium: 135 — AB (ref 137–147)

## 2018-06-30 ENCOUNTER — Non-Acute Institutional Stay (SKILLED_NURSING_FACILITY): Payer: Medicare Other | Admitting: Internal Medicine

## 2018-06-30 ENCOUNTER — Encounter: Payer: Self-pay | Admitting: Internal Medicine

## 2018-06-30 DIAGNOSIS — J449 Chronic obstructive pulmonary disease, unspecified: Secondary | ICD-10-CM | POA: Diagnosis not present

## 2018-06-30 DIAGNOSIS — I5032 Chronic diastolic (congestive) heart failure: Secondary | ICD-10-CM

## 2018-06-30 DIAGNOSIS — I4819 Other persistent atrial fibrillation: Secondary | ICD-10-CM

## 2018-06-30 DIAGNOSIS — E032 Hypothyroidism due to medicaments and other exogenous substances: Secondary | ICD-10-CM

## 2018-06-30 DIAGNOSIS — N183 Chronic kidney disease, stage 3 unspecified: Secondary | ICD-10-CM

## 2018-06-30 NOTE — Progress Notes (Signed)
Location:  [place] Room Number: 39/A Place of Service:  SNF [phone]) Provider: Veleta Miners L,MD   [name], MD  Patient Care Team: [name], MD as PCP - General (Internal Medicine) Debara Pickett Nadean Corwin, MD as PCP - Cardiology (Cardiology) Nicholas Lose, MD as Consulting Physician (Hematology and Oncology)  Extended Emergency Contact Information Primary Emergency Contact: Little Ishikawa Mobile Phone: [phone] Relation: Niece Secondary Emergency Contact: Cromer,Chad Mobile Phone: [phone] Relation: Nephew   Code Status: DNR  Goals of care: Advanced Directive information Advanced Directives 06/30/2018  Does Patient Have a Medical Advance Directive? Yes  Type of Paramedic of [place];Out of facility DNR (pink MOST or yellow form)  Does patient want to make changes to medical advance directive? No - Patient declined  Copy of [place] in Chart? No - copy requested  Would patient like information on creating a medical advance directive? No - Patient declined  Pre-existing out of facility DNR order (yellow form or pink MOST form) Pink MOST form placed in chart (order not valid for inpatient use)     Chief Complaint  Patient presents with  . Acute Visit    Follow up     HPI:  Pt is a 85 y.o. female seen today for an acute visit for Follow up of Her BP , Tachycardia and SOB  Patient has PMH of  TIA in 2010 s/p CEA in 2010, COPD on oxygen at night, Hypertension,GERD,Hypothyroidism, Gastritis and Paget's disease of Nipple  And Recently she has been diagnosed withNew onset Atrial Fibrillation, CHF, bilateral pleural effusions requiring thoracocentesis and right upper lobe nodule suspicious of cancer. Patient has been to the hospital number of times due to Pleural Effusion and CHF. In Facility we are watching her weight very carefully   Past Medical History:  Diagnosis Date  . Arthritis   .  Constipation   . COPD (chronic obstructive pulmonary disease) ([place])   . Fatigue   . Frequent UTI   . GERD (gastroesophageal reflux disease)   . History of shingles 2007  . Hyperlipemia   . Hypertension   . Hypothyroidism   . Multiple allergies   . Osteoporosis   . Paget's carcinoma of the nipple ([place]) 03/16/2013  . Pneumonia   . Seasonal allergies   . Stroke [facility]) 2010   no residual  . Thyroid disease   . Uterine cancer [facility])    Past Surgical History:  Procedure Laterality Date  . ABDOMINAL HYSTERECTOMY    . BREAST LUMPECTOMY     left breast  . CAROTID ENDARTERECTOMY Left June 10, 2008   CE  . EYE SURGERY     cataracts removed bilaterally  . TONSILLECTOMY      Allergies  Allergen Reactions  . Sulfa Antibiotics Shortness Of Breath and Palpitations  . Sulfamethoxazole Shortness Of Breath and Palpitations  . Sulfur Shortness Of Breath and Palpitations    "Allergic," per MAR (although I believe they might've meant "Sulfa"??)  . Amoxicillin Rash  . Penicillin G Rash    Did it involve swelling of the face/tongue/throat, SOB, or low BP? Yes Did it involve sudden or severe rash/hives, skin peeling, or any reaction on the inside of your mouth or nose? Unk Did you need to seek medical attention at a hospital or doctor's office? Unk When did it last happen? "it was a long time ago" If all above answers are "NO", may proceed with cephalosporin use.   Marland Kitchen  Penicillins Rash    Did it involve swelling of the face/tongue/throat, SOB, or low BP? Yes Did it involve sudden or severe rash/hives, skin peeling, or any reaction on the inside of your mouth or nose? Unk Did you need to seek medical attention at a hospital or doctor's office? Unk When did it last happen? "it was a long time ago" If all above answers are "NO", may proceed with cephalosporin use.   . Tape Other (See Comments)    SKIN IS VERY FRAGILE- PLEASE USE AN ALTERNATIVE TO TAPE, AS THE SKIN TEARS EASILY!!     Outpatient Encounter Medications as of 06/30/2018  Medication Sig  . amiodarone (PACERONE) 200 MG tablet Take 200 mg by mouth daily.  Marland Kitchen apixaban (ELIQUIS) 2.5 MG TABS tablet Take 1 tablet (2.5 mg total) by mouth 2 (two) times daily.  Marland Kitchen diltiazem (CARDIZEM CD) 120 MG 24 hr capsule Take 1 capsule (120 mg total) by mouth daily.  Marland Kitchen DOXYCYCLINE HYCLATE PO Take 100 mg by mouth 2 (two) times a day. For 10 days  . ergocalciferol (VITAMIN D2) 1.25 MG (50000 UT) capsule Take 1,250 Units by mouth once a week. Once a day on Sunday  . fluticasone (FLONASE) 50 MCG/ACT nasal spray Place 2 sprays into both nostrils daily.   . Fluticasone-Salmeterol (ADVAIR) 250-50 MCG/DOSE AEPB Inhale 1 puff into the lungs every 12 (twelve) hours.  . furosemide (LASIX) 40 MG tablet Take 40 mg by mouth daily.  Marland Kitchen ipratropium-albuterol (DUONEB) 0.5-2.5 (3) MG/3ML SOLN Take 3 mLs by nebulization every 6 (six) hours as needed (WHEEZING).  Marland Kitchen metoprolol tartrate (LOPRESSOR) 25 MG tablet Take 12.5 mg by mouth daily.   . Multiple Vitamins-Minerals (CENTRUM SILVER 50+WOMEN) TABS Take 1 tablet by mouth daily with breakfast.  . Multiple Vitamins-Minerals (ICAPS AREDS 2 PO) Take 1 tablet by mouth daily.  . Nutritional Supplements (RESOURCE SUPPORT) LIQD Take 90 mLs by mouth 3 (three) times daily between meals.   . OXYGEN Inhale 2 L into the lungs continuous. SAT MUST BE >90%  . pantoprazole (PROTONIX) 40 MG tablet Take 40 mg by mouth daily.   . potassium chloride (KLOR-CON) 20 MEQ packet Take 20 mEq by mouth 2 (two) times daily.  Marland Kitchen saccharomyces boulardii (FLORASTOR) 250 MG capsule Take 250 mg by mouth 2 (two) times daily.  Marland Kitchen tiotropium (SPIRIVA) 18 MCG inhalation capsule Place 18 mcg into inhaler and inhale daily.  Marland Kitchen torsemide (DEMADEX) 20 MG tablet Take 20 mg by mouth daily. On Monday, Wednesday, and Friday  . zinc oxide 20 % ointment Apply 1 application topically as needed for irritation. Apply to buttocks after every incontinent episode  and as needed for redness  . furosemide (LASIX) 40 MG tablet Take 40 mg by mouth daily.   No facility-administered encounter medications on file as of 06/30/2018.     Review of Systems  Constitutional: Positive for activity change and appetite change.  HENT: Negative.   Respiratory: Positive for cough and shortness of breath.   Cardiovascular: Negative.   Gastrointestinal: Positive for constipation.  Genitourinary: Negative.   Musculoskeletal: Negative.   Skin: Negative.   Neurological: Positive for weakness.  Psychiatric/Behavioral: Negative.     Immunization History  Administered Date(s) Administered  . Influenza-Unspecified 11/15/2013  . PPD Test 07/24/2011  . Zoster Recombinat (Shingrix) 12/10/2017   Pertinent  Health Maintenance Due  Topic Date Due  . PNA vac Low Risk Adult (1 of 2 - PCV13) 10/02/1984  . INFLUENZA VACCINE  09/16/2018  . DEXA  SCAN  Completed   Fall Risk  01/28/2014  Falls in the past year? Yes  Number falls in past yr: 1  Injury with Fall? No   Functional Status Survey:    Vitals:   06/30/18 0956  BP: 107/64  Pulse: (!) 102  Resp: 20  Temp: (!) 97.1 F (36.2 C)  SpO2: 90%  Weight: 96 lb 8 oz (43.8 kg)  Height: 4\' 10"  (1.473 m)   Body mass index is 20.17 kg/m. Physical Exam Vitals signs reviewed.  HENT:     Head: Normocephalic.     Nose: Nose normal.     Mouth/Throat:     Mouth: Mucous membranes are moist.     Pharynx: Oropharynx is clear.  Eyes:     Pupils: Pupils are equal, round, and reactive to light.  Neck:     Musculoskeletal: Neck supple.  Cardiovascular:     Rate and Rhythm: Normal rate. Rhythm irregular.     Pulses: Normal pulses.     Heart sounds: Normal heart sounds.  Pulmonary:     Effort: Pulmonary effort is normal.     Comments: Decrease breadth Sounds Bilateral but no rales or Crackles On 4 L of Oxygen Abdominal:     General: Abdomen is flat. Bowel sounds are normal.     Palpations: Abdomen is soft.   Musculoskeletal:     Comments: Mild swelling Bilateral  Skin:    General: Skin is warm and dry.  Neurological:     General: No focal deficit present.     Mental Status: She is alert and oriented to person, place, and time.  Psychiatric:        Mood and Affect: Mood normal.        Thought Content: Thought content normal.        Judgment: Judgment normal.     Labs reviewed: Recent Labs    03/17/18 1753  05/01/18 0545 05/02/18 0402  05/21/18 0815 05/22/18 7829 05/23/18 0326 05/29/18 06/08/18  NA  --    < > 134* 133*   < > 138 137 136 138 137  K  --    < > 3.9 4.4   < > 3.8 3.3* 3.0* 4.8 4.8  CL  --    < > 95* 96*   < > 95* 93* 93*  --  98  CO2  --    < > 29 27   < > 33* 35* 33*  --  32  GLUCOSE  --    < > 140* 131*   < > 96 95 94  --   --   BUN  --    < > 34* 43*   < > 26* 26* 25* 37* 38*  CREATININE  --    < > 1.39* 1.50*   < > 1.48* 1.32* 1.17* 1.6* 1.5*  CALCIUM  --    < > 8.9 8.7*   < > 9.1 8.6* 8.5*  --  8.7  MG 1.7  --  2.0 1.9  --   --   --   --   --   --    < > = values in this interval not displayed.   Recent Labs    05/01/18 0545 05/02/18 0402 05/11/18 05/19/18 2000  AST 19 23 11* 19  ALT 19 24 12 19   ALKPHOS 62 56 56 79  BILITOT 0.5 0.6  --  0.7  PROT 6.3* 5.7*  --  6.3*  ALBUMIN 3.0* 2.9*  --  3.1*   Recent Labs    03/17/18 1604  05/02/18 0402 05/11/18 05/19/18 2000 05/21/18 0815 05/29/18  WBC 17.1*   < > 9.2 12.1 10.0 12.0*  --   NEUTROABS 12.8*  --   --   --  7.9* 9.1*  --   HGB 11.3*   < > 9.9* 10.8* 11.2* 10.4* 9.6*  HCT 34.5*   < > 30.2* 32* 35.8* 31.9* 29*  MCV 93.8   < > 93.8  --  94.7 94.4  --   PLT 253   < > 348 282 248 289 339   < > = values in this interval not displayed.   Lab Results  Component Value Date   TSH 1.16 06/26/2018   No results found for: HGBA1C No results found for: CHOL, HDL, LDLCALC, LDLDIRECT, TRIG, CHOLHDL  Significant Diagnostic Results in last 30 days:  No results found.  Assessment/Plan Acute on chronic  diastolic (congestive) heart failure Patient is slightly on drier side but she feels much better and her breathing is more Stable Will discontinue her lasix and change it to Torsemide 20 mg for 4 days alternate with 40 mg 3 days Continue to monitor her weight closely 1500 cc Water restriction  Pleural effusion due to CHF S/P Thoracocentesis in hospital Repeat Chest Xray in facility shows Moderate Effusion in Right lower lobe Continue Diuresis  Possible suspicious Upper right Lobe nodule ? Bronchogenic carcinoma Patient is aware and does not want any treatment right now Chronic obstructive pulmonary disease, Continue on Spiriva and Advair On 3-4 l of oxygen Hypothyroidism Dose was increased in 03/20 Repeat TSH in facility showed TSH of 1.16  CKD (chronic kidney disease) stage 3, Last creat was at baseline BUN was slightly up at 45 Would not change Diuretic dose as she continues to have Pleural effusions Repeat BMP in 4 weeks  AtrialFibrillation On amiodarone and Eliquis Also she is on Cardizem and low dose of Lopressor Anemia Hgb is low but stable   Family/ staff Communication:   Labs/tests ordered:  BMP in 4 weeks  Total time spent in this patient care encounter was  25_  minutes; greater than 50% of the visit spent counseling patient and staff, reviewing records , Labs and coordinating care for problems addressed at this encounter.

## 2018-07-06 DIAGNOSIS — E039 Hypothyroidism, unspecified: Secondary | ICD-10-CM | POA: Diagnosis not present

## 2018-07-06 DIAGNOSIS — J9611 Chronic respiratory failure with hypoxia: Secondary | ICD-10-CM | POA: Diagnosis not present

## 2018-07-06 LAB — TSH: TSH: 2.84 (ref 0.41–5.90)

## 2018-07-12 ENCOUNTER — Encounter: Payer: Self-pay | Admitting: Internal Medicine

## 2018-07-12 ENCOUNTER — Non-Acute Institutional Stay (SKILLED_NURSING_FACILITY): Payer: Medicare Other | Admitting: Internal Medicine

## 2018-07-12 DIAGNOSIS — I5032 Chronic diastolic (congestive) heart failure: Secondary | ICD-10-CM | POA: Diagnosis not present

## 2018-07-12 DIAGNOSIS — J449 Chronic obstructive pulmonary disease, unspecified: Secondary | ICD-10-CM

## 2018-07-12 DIAGNOSIS — I509 Heart failure, unspecified: Secondary | ICD-10-CM | POA: Diagnosis not present

## 2018-07-12 DIAGNOSIS — I4819 Other persistent atrial fibrillation: Secondary | ICD-10-CM

## 2018-07-12 NOTE — Progress Notes (Signed)
Location:  [place] Room Number: 39/A Place of Service:  SNF (31) Provider:Gupta Anjali L,MD   [name], MD  Patient Care Team: [name], MD as PCP - General (Internal Medicine) Debara Pickett Nadean Corwin, MD as PCP - Cardiology (Cardiology) Nicholas Lose, MD as Consulting Physician (Hematology and Oncology)  Extended Emergency Contact Information Primary Emergency Contact: Little Ishikawa Mobile Phone: [phone] Relation: Niece Secondary Emergency Contact: Cromer,Chad Mobile Phone: [phone] Relation: Nephew  Code Status:DNR  Goals of care: Advanced Directive information Advanced Directives 07/12/2018  Does Patient Have a Medical Advance Directive? Yes  Type of Paramedic of [place];Out of facility DNR (pink MOST or yellow form)  Does patient want to make changes to medical advance directive? No - Patient declined  Copy of [place] in Chart? No - copy requested  Would patient like information on creating a medical advance directive? No - Patient declined  Pre-existing out of facility DNR order (yellow form or pink MOST form) Yellow form placed in chart (order not valid for inpatient use);Pink MOST form placed in chart (order not valid for inpatient use)     Chief Complaint  Patient presents with  . Acute Visit    Follow up with weight loss     HPI:  Pt is a 85 y.o. female seen today for an acute visit for Follow up of her Weight Loss  Patient has PMH ofTIA in 2010 s/p CEA in 2010, COPD on oxygen at night, Hypertension,GERD,Hypothyroidism, Gastritis and Paget's disease of Nipple  And Recently she has been diagnosed withNew onset Atrial Fibrillation, CHF, bilateral pleural effusions requiring thoracocentesis and right upper lobe nodule suspicious of cancer. Patient has been to the hospital number of times due to Pleural Effusion and CHF.  Patient is doing well. Eating fair. Her weight is down  now to 93 lbs from 98 lbs She is on Demadex for her CHF and Pleural effusion. She Continues to have effusion in her Chest Xray. But Clinically she is doing better. Denies any SOB. Though she continues on 4 l Of Oxygen. Walking with Gilford Rile. Does have cough but no Fever. Her main complain was [employer] all the time and wants to know if we can change her Fluid restrictions    Past Medical History:  Diagnosis Date  . Arthritis   . Constipation   . COPD (chronic obstructive pulmonary disease) ([place])   . Fatigue   . Frequent UTI   . GERD (gastroesophageal reflux disease)   . History of shingles 2007  . Hyperlipemia   . Hypertension   . Hypothyroidism   . Multiple allergies   . Osteoporosis   . Paget's carcinoma of the nipple ([place]) 03/16/2013  . Pneumonia   . Seasonal allergies   . Stroke [facility]) 2010   no residual  . Thyroid disease   . Uterine cancer [facility])    Past Surgical History:  Procedure Laterality Date  . ABDOMINAL HYSTERECTOMY    . BREAST LUMPECTOMY     left breast  . CAROTID ENDARTERECTOMY Left June 10, 2008   CE  . EYE SURGERY     cataracts removed bilaterally  . TONSILLECTOMY      Allergies  Allergen Reactions  . Sulfa Antibiotics Shortness Of Breath and Palpitations  . Sulfamethoxazole Shortness Of Breath and Palpitations  . Sulfur Shortness Of Breath and Palpitations    "Allergic," per MAR (although I believe they might've meant "Sulfa"??)  . Amoxicillin Rash  .  Penicillin G Rash    Did it involve swelling of the face/tongue/throat, SOB, or low BP? Yes Did it involve sudden or severe rash/hives, skin peeling, or any reaction on the inside of your mouth or nose? Unk Did you need to seek medical attention at a hospital or doctor's office? Unk When did it last happen? "it was a long time ago" If all above answers are "NO", may proceed with cephalosporin use.   Marland Kitchen Penicillins Rash    Did it involve swelling of the face/tongue/throat, SOB, or low BP? Yes  Did it involve sudden or severe rash/hives, skin peeling, or any reaction on the inside of your mouth or nose? Unk Did you need to seek medical attention at a hospital or doctor's office? Unk When did it last happen? "it was a long time ago" If all above answers are "NO", may proceed with cephalosporin use.   . Tape Other (See Comments)    SKIN IS VERY FRAGILE- PLEASE USE AN ALTERNATIVE TO TAPE, AS THE SKIN TEARS EASILY!!    Outpatient Encounter Medications as of 07/12/2018  Medication Sig  . amiodarone (PACERONE) 200 MG tablet Take 200 mg by mouth daily.  Marland Kitchen apixaban (ELIQUIS) 2.5 MG TABS tablet Take 1 tablet (2.5 mg total) by mouth 2 (two) times daily.  Marland Kitchen diltiazem (CARDIZEM CD) 120 MG 24 hr capsule Take 1 capsule (120 mg total) by mouth daily.  . ergocalciferol (VITAMIN D2) 1.25 MG (50000 UT) capsule Take 1,250 Units by mouth once a week. Once a day on Sunday  . fluticasone (FLONASE) 50 MCG/ACT nasal spray Place 2 sprays into both nostrils daily.   . Fluticasone-Salmeterol (ADVAIR) 250-50 MCG/DOSE AEPB Inhale 1 puff into the lungs 2 (two) times a day.   . ipratropium-albuterol (DUONEB) 0.5-2.5 (3) MG/3ML SOLN Take 3 mLs by nebulization every 6 (six) hours as needed (WHEEZING).  Marland Kitchen levothyroxine (SYNTHROID) 100 MCG tablet Take 100 mcg by mouth daily before breakfast.  . metoprolol tartrate (LOPRESSOR) 25 MG tablet Take 12.5 mg by mouth daily.   . Multiple Vitamins-Minerals (CENTRUM SILVER 50+WOMEN) TABS Take 1 tablet by mouth daily with breakfast.  . Multiple Vitamins-Minerals (ICAPS AREDS 2 PO) Take 1 tablet by mouth daily.  . OXYGEN Inhale 2 L into the lungs continuous. SAT MUST BE >90%  . pantoprazole (PROTONIX) 40 MG tablet Take 40 mg by mouth daily.   . potassium chloride (KLOR-CON) 20 MEQ packet Take 20 mEq by mouth 2 (two) times daily.  Marland Kitchen tiotropium (SPIRIVA) 18 MCG inhalation capsule Place 18 mcg into inhaler and inhale daily.  Marland Kitchen torsemide (DEMADEX) 20 MG tablet Take 20 mg by mouth  daily. On Monday, Wednesday, and Friday  . torsemide (DEMADEX) 20 MG tablet Take 20 mg by mouth daily.  Marland Kitchen zinc oxide 20 % ointment Apply 1 application topically as needed for irritation. Apply to buttocks after every incontinent episode and as needed for redness  . Nutritional Supplements (RESOURCE SUPPORT) LIQD Take 90 mLs by mouth 3 (three) times daily between meals.   . saccharomyces boulardii (FLORASTOR) 250 MG capsule Take 250 mg by mouth 2 (two) times daily.   No facility-administered encounter medications on file as of 07/12/2018.     Review of Systems  Constitutional: Positive for unexpected weight change.  HENT: Negative.   Respiratory: Positive for cough and shortness of breath.   Cardiovascular: Negative.   Gastrointestinal: Positive for constipation.  Genitourinary: Negative.   Musculoskeletal: Negative.   Skin: Negative.   Neurological: Positive for  weakness.  Psychiatric/Behavioral: Negative.     Immunization History  Administered Date(s) Administered  . Influenza-Unspecified 11/15/2013  . PPD Test 07/24/2011  . Zoster Recombinat (Shingrix) 12/10/2017   Pertinent  Health Maintenance Due  Topic Date Due  . PNA vac Low Risk Adult (1 of 2 - PCV13) 10/02/1984  . INFLUENZA VACCINE  09/16/2018  . DEXA SCAN  Completed   Fall Risk  01/28/2014  Falls in the past year? Yes  Number falls in past yr: 1  Injury with Fall? No   Functional Status Survey:    Vitals:   07/12/18 1022  BP: (!) 95/50  Pulse: 91  Resp: 18  Temp: 97.9 F (36.6 C)  SpO2: 99%  Weight: 93 lb 12.8 oz (42.5 kg)  Height: 4\' 10"  (1.473 m)   Body mass index is 19.6 kg/m. Physical Exam Vitals signs reviewed.  HENT:     Head: Normocephalic.     Nose: Nose normal.     Mouth/Throat:     Mouth: Mucous membranes are dry.     Pharynx: Oropharynx is clear.  Eyes:     Pupils: Pupils are equal, round, and reactive to light.  Neck:     Musculoskeletal: Neck supple.  Cardiovascular:     Rate  and Rhythm: Normal rate and regular rhythm.     Pulses: Normal pulses.  Pulmonary:     Effort: Pulmonary effort is normal. No respiratory distress.     Breath sounds: Normal breath sounds. No wheezing or rales.  Abdominal:     General: Abdomen is flat. Bowel sounds are normal.     Palpations: Abdomen is soft.  Musculoskeletal:        General: No swelling.  Skin:    General: Skin is warm and dry.  Neurological:     General: No focal deficit present.     Mental Status: She is alert and oriented to person, place, and time.  Psychiatric:        Mood and Affect: Mood normal.        Thought Content: Thought content normal.        Judgment: Judgment normal.     Labs reviewed: Recent Labs    03/17/18 1753  05/01/18 0545 05/02/18 0402  05/21/18 0815 05/22/18 7867 05/23/18 0326 05/29/18 06/08/18 06/29/18  NA  --    < > 134* 133*   < > 138 137 136 138 137 135*  K  --    < > 3.9 4.4   < > 3.8 3.3* 3.0* 4.8 4.8 4.4  CL  --    < > 95* 96*   < > 95* 93* 93*  --  98  --   CO2  --    < > 29 27   < > 33* 35* 33*  --  32  --   GLUCOSE  --    < > 140* 131*   < > 96 95 94  --   --   --   BUN  --    < > 34* 43*   < > 26* 26* 25* 37* 38* 45*  CREATININE  --    < > 1.39* 1.50*   < > 1.48* 1.32* 1.17* 1.6* 1.5* 1.9*  CALCIUM  --    < > 8.9 8.7*   < > 9.1 8.6* 8.5*  --  8.7  --   MG 1.7  --  2.0 1.9  --   --   --   --   --   --   --    < > =  values in this interval not displayed.   Recent Labs    05/01/18 0545 05/02/18 0402 05/11/18 05/19/18 2000  AST 19 23 11* 19  ALT 19 24 12 19   ALKPHOS 62 56 56 79  BILITOT 0.5 0.6  --  0.7  PROT 6.3* 5.7*  --  6.3*  ALBUMIN 3.0* 2.9*  --  3.1*   Recent Labs    03/17/18 1604  05/02/18 0402 05/11/18 05/19/18 2000 05/21/18 0815 05/29/18  WBC 17.1*   < > 9.2 12.1 10.0 12.0*  --   NEUTROABS 12.8*  --   --   --  7.9* 9.1*  --   HGB 11.3*   < > 9.9* 10.8* 11.2* 10.4* 9.6*  HCT 34.5*   < > 30.2* 32* 35.8* 31.9* 29*  MCV 93.8   < > 93.8  --  94.7  94.4  --   PLT 253   < > 348 282 248 289 339   < > = values in this interval not displayed.   Lab Results  Component Value Date   TSH 1.16 06/26/2018   No results found for: HGBA1C No results found for: CHOL, HDL, LDLCALC, LDLDIRECT, TRIG, CHOLHDL  Significant Diagnostic Results in last 30 days:  No results found.  Assessment/Plan  Acute on chronic diastolic (congestive) heart failure Patient is slightly on drier side but she feels much better and her breathing is more Stable On Torsemide 20 mg for 4 days alternate with 40 mg 3 days Continue to monitor her weight closely Remove Fluid restriction for now Repeat BMP in am  Pleural effusion due to CHF S/PThoracocentesis in hospital Repeat Chest Xray in facility shows Moderate Effusion in Right lower lobe Continue Diuresis  Possible suspicious Upper right Lobe nodule ? Bronchogenic carcinoma Patient is aware and does not want any treatment right now Chronic obstructive pulmonary disease, Continue on Spirivaand Advair On 3-4 l of oxygen Hypothyroidism Dose was increased in 03/20 Repeat TSH in facility showed TSH of 1.16  CKD (chronic kidney disease) stage 3, Last creat was at baseline BUN was slightly up at 45 Would not change Diuretic dose as she continues to have Pleural effusions Repeat BMP in am  AtrialFibrillation On amiodarone and Eliquis Also she is on Cardizem and low dose of Lopressor Anemia Hgb is low but stable    Family/ staff Communication:   Labs/tests ordered:  BMP Total time spent in this patient care encounter was  25_  minutes; greater than 50% of the visit spent counseling patient and staff, reviewing records , Labs and coordinating care for problems addressed at this encounter.

## 2018-07-13 DIAGNOSIS — J9611 Chronic respiratory failure with hypoxia: Secondary | ICD-10-CM | POA: Diagnosis not present

## 2018-07-13 DIAGNOSIS — I5033 Acute on chronic diastolic (congestive) heart failure: Secondary | ICD-10-CM | POA: Diagnosis not present

## 2018-07-18 ENCOUNTER — Ambulatory Visit: Payer: Medicare Other | Admitting: Podiatry

## 2018-07-28 ENCOUNTER — Encounter: Payer: Self-pay | Admitting: Internal Medicine

## 2018-07-28 ENCOUNTER — Non-Acute Institutional Stay (SKILLED_NURSING_FACILITY): Payer: Medicare Other | Admitting: Internal Medicine

## 2018-07-28 DIAGNOSIS — N183 Chronic kidney disease, stage 3 unspecified: Secondary | ICD-10-CM

## 2018-07-28 DIAGNOSIS — E785 Hyperlipidemia, unspecified: Secondary | ICD-10-CM

## 2018-07-28 DIAGNOSIS — E032 Hypothyroidism due to medicaments and other exogenous substances: Secondary | ICD-10-CM | POA: Diagnosis not present

## 2018-07-28 DIAGNOSIS — I5032 Chronic diastolic (congestive) heart failure: Secondary | ICD-10-CM | POA: Diagnosis not present

## 2018-07-28 DIAGNOSIS — I509 Heart failure, unspecified: Secondary | ICD-10-CM

## 2018-07-28 DIAGNOSIS — I4819 Other persistent atrial fibrillation: Secondary | ICD-10-CM | POA: Diagnosis not present

## 2018-07-28 DIAGNOSIS — J449 Chronic obstructive pulmonary disease, unspecified: Secondary | ICD-10-CM

## 2018-07-28 NOTE — Progress Notes (Signed)
Location:  [place] Room Number: 39/A Place of Service:  SNF (31) Provider:Gupta Anjali L,MD   [name], MD  Patient Care Team: [name], MD as PCP - General (Internal Medicine) Debara Pickett Nadean Corwin, MD as PCP - Cardiology (Cardiology) Nicholas Lose, MD as Consulting Physician (Hematology and Oncology)  Extended Emergency Contact Information Primary Emergency Contact: Little Ishikawa Mobile Phone: [phone] Relation: Niece Secondary Emergency Contact: Cromer,Chad Mobile Phone: [phone] Relation: Nephew  Code Status: DNR  Goals of care: Advanced Directive information Advanced Directives 07/28/2018  Does Patient Have a Medical Advance Directive? Yes  Type of Paramedic of [place];Out of facility DNR (pink MOST or yellow form)  Does patient want to make changes to medical advance directive? No - Patient declined  Copy of [place] in Chart? No - copy requested  Would patient like information on creating a medical advance directive? No - Patient declined  Pre-existing out of facility DNR order (yellow form or pink MOST form) Pink MOST form placed in chart (order not valid for inpatient use)     Chief Complaint  Patient presents with  . Medical Management of Chronic Issues    Routine Visit   . Health Maintenance    tetanus vaccine, pcv13 vaccine     HPI:  Pt is a 85 y.o. female seen today for medical management of chronic diseases.    Patient has PMH ofTIA in 2010 s/p CEA in 2010, COPD on oxygen at night, Hypertension,GERD,Hypothyroidism, Gastritis and Paget's disease of Nipple  And Recently she has been diagnosed withNew onset Atrial Fibrillation, CHF, bilateral pleural effusions requiring thoracocentesis and right upper lobe nodule suspicious of cancer. Patient has been to the hospital number of times due to Pleural Effusion and CHF She has been stable for past few months. But Continue to  have Cough, SOB on 4 l of Oxygen. She is walking with walker and Mild assist. Appetite is good. Weight is staying stable No New Nursing Issues .  Past Medical History:  Diagnosis Date  . Arthritis   . Constipation   . COPD (chronic obstructive pulmonary disease) ([place])   . Fatigue   . Frequent UTI   . GERD (gastroesophageal reflux disease)   . History of shingles 2007  . Hyperlipemia   . Hypertension   . Hypothyroidism   . Multiple allergies   . Osteoporosis   . Paget's carcinoma of the nipple ([place]) 03/16/2013  . Pneumonia   . Seasonal allergies   . Stroke [facility]) 2010   no residual  . Thyroid disease   . Uterine cancer [facility])    Past Surgical History:  Procedure Laterality Date  . ABDOMINAL HYSTERECTOMY    . BREAST LUMPECTOMY     left breast  . CAROTID ENDARTERECTOMY Left June 10, 2008   CE  . EYE SURGERY     cataracts removed bilaterally  . TONSILLECTOMY      Allergies  Allergen Reactions  . Sulfa Antibiotics Shortness Of Breath and Palpitations  . Sulfamethoxazole Shortness Of Breath and Palpitations  . Sulfur Shortness Of Breath and Palpitations    "Allergic," per MAR (although I believe they might've meant "Sulfa"??)  . Amoxicillin Rash  . Penicillin G Rash    Did it involve swelling of the face/tongue/throat, SOB, or low BP? Yes Did it involve sudden or severe rash/hives, skin peeling, or any reaction on the inside of your mouth or nose? Unk Did you need to seek  medical attention at a hospital or doctor's office? Unk When did it last happen? "it was a long time ago" If all above answers are "NO", may proceed with cephalosporin use.   Marland Kitchen Penicillins Rash    Did it involve swelling of the face/tongue/throat, SOB, or low BP? Yes Did it involve sudden or severe rash/hives, skin peeling, or any reaction on the inside of your mouth or nose? Unk Did you need to seek medical attention at a hospital or doctor's office? Unk When did it last happen? "it was a long  time ago" If all above answers are "NO", may proceed with cephalosporin use.   . Tape Other (See Comments)    SKIN IS VERY FRAGILE- PLEASE USE AN ALTERNATIVE TO TAPE, AS THE SKIN TEARS EASILY!!    Outpatient Encounter Medications as of 07/28/2018  Medication Sig  . amiodarone (PACERONE) 200 MG tablet Take 200 mg by mouth daily.  Marland Kitchen apixaban (ELIQUIS) 2.5 MG TABS tablet Take 1 tablet (2.5 mg total) by mouth 2 (two) times daily.  Marland Kitchen diltiazem (CARDIZEM CD) 120 MG 24 hr capsule Take 1 capsule (120 mg total) by mouth daily.  . ergocalciferol (VITAMIN D2) 1.25 MG (50000 UT) capsule Take 1,250 Units by mouth once a week. Once a day on Sunday  . fluticasone (FLONASE) 50 MCG/ACT nasal spray Place 2 sprays into both nostrils daily.   . Fluticasone-Salmeterol (ADVAIR) 250-50 MCG/DOSE AEPB Inhale 1 puff into the lungs 2 (two) times a day.   . ipratropium-albuterol (DUONEB) 0.5-2.5 (3) MG/3ML SOLN Take 3 mLs by nebulization every 6 (six) hours as needed (WHEEZING).  Marland Kitchen levothyroxine (SYNTHROID) 100 MCG tablet Take 100 mcg by mouth daily before breakfast.  . metoprolol tartrate (LOPRESSOR) 25 MG tablet Take 12.5 mg by mouth daily.   . Multiple Vitamins-Minerals (CENTRUM SILVER 50+WOMEN) TABS Take 1 tablet by mouth daily with breakfast.  . Multiple Vitamins-Minerals (ICAPS AREDS 2 PO) Take 1 tablet by mouth daily.  . Nutritional Supplements (RESOURCE SUPPORT) LIQD Take 90 mLs by mouth 3 (three) times daily between meals.   . OXYGEN Inhale 2 L into the lungs continuous. SAT MUST BE >90%  . pantoprazole (PROTONIX) 40 MG tablet Take 40 mg by mouth daily.   . potassium chloride (KLOR-CON) 20 MEQ packet Take 20 mEq by mouth 2 (two) times daily.  . sennosides-docusate sodium (SENOKOT-S) 8.6-50 MG tablet Take 1 tablet by mouth 2 (two) times a day.  . tiotropium (SPIRIVA) 18 MCG inhalation capsule Place 18 mcg into inhaler and inhale daily.  Marland Kitchen torsemide (DEMADEX) 20 MG tablet Take 20 mg by mouth daily. On Monday,  Wednesday, and Friday  . torsemide (DEMADEX) 20 MG tablet Take 20 mg by mouth daily.  Marland Kitchen zinc oxide 20 % ointment Apply 1 application topically as needed for irritation. Apply to buttocks after every incontinent episode and as needed for redness  . [DISCONTINUED] saccharomyces boulardii (FLORASTOR) 250 MG capsule Take 250 mg by mouth 2 (two) times daily.   No facility-administered encounter medications on file as of 07/28/2018.     Review of Systems  Respiratory: Positive for cough and shortness of breath.   Gastrointestinal: Positive for constipation.  Neurological: Positive for weakness.  All other systems reviewed and are negative.   Immunization History  Administered Date(s) Administered  . Influenza-Unspecified 11/15/2013  . PPD Test 07/24/2011  . Zoster Recombinat (Shingrix) 12/10/2017   Pertinent  Health Maintenance Due  Topic Date Due  . PNA vac Low Risk Adult (1 of  2 - PCV13) 10/02/1984  . INFLUENZA VACCINE  09/16/2018  . DEXA SCAN  Completed   Fall Risk  01/28/2014  Falls in the past year? Yes  Number falls in past yr: 1  Injury with Fall? No   Functional Status Survey:    Vitals:   07/28/18 0855  BP: 110/73  Pulse: 94  Resp: 18  Temp: 98 F (36.7 C)  SpO2: 91%  Weight: 94 lb 11.2 oz (43 kg)  Height: 4\' 10"  (1.473 m)   Body mass index is 19.79 kg/m. Physical Exam Vitals signs reviewed.  Constitutional:      Appearance: Normal appearance.  HENT:     Head: Normocephalic.     Nose: Nose normal.     Mouth/Throat:     Mouth: Mucous membranes are moist.     Pharynx: Oropharynx is clear.  Eyes:     Pupils: Pupils are equal, round, and reactive to light.  Neck:     Musculoskeletal: Neck supple.  Cardiovascular:     Rate and Rhythm: Normal rate and regular rhythm.     Pulses: Normal pulses.     Heart sounds: Normal heart sounds.  Pulmonary:     Effort: Pulmonary effort is normal.     Comments: Continues to Have Rales in Right Lung. Left was clear No  Wheezing Abdominal:     General: Abdomen is flat. Bowel sounds are normal.     Palpations: Abdomen is soft.  Musculoskeletal:        General: No swelling.  Skin:    General: Skin is warm and dry.  Neurological:     General: No focal deficit present.     Mental Status: She is alert and oriented to person, place, and time.  Psychiatric:        Mood and Affect: Mood normal.        Behavior: Behavior normal.     Labs reviewed: Recent Labs    03/17/18 1753  05/01/18 0545 05/02/18 0402  05/21/18 0815 05/22/18 9767 05/23/18 0326 05/29/18 06/08/18 06/29/18  NA  --    < > 134* 133*   < > 138 137 136 138 137 135*  K  --    < > 3.9 4.4   < > 3.8 3.3* 3.0* 4.8 4.8 4.4  CL  --    < > 95* 96*   < > 95* 93* 93*  --  98  --   CO2  --    < > 29 27   < > 33* 35* 33*  --  32  --   GLUCOSE  --    < > 140* 131*   < > 96 95 94  --   --   --   BUN  --    < > 34* 43*   < > 26* 26* 25* 37* 38* 45*  CREATININE  --    < > 1.39* 1.50*   < > 1.48* 1.32* 1.17* 1.6* 1.5* 1.9*  CALCIUM  --    < > 8.9 8.7*   < > 9.1 8.6* 8.5*  --  8.7  --   MG 1.7  --  2.0 1.9  --   --   --   --   --   --   --    < > = values in this interval not displayed.   Recent Labs    05/01/18 0545 05/02/18 0402 05/11/18 05/19/18 2000  AST 19 23 11* 19  ALT  19 24 12 19   ALKPHOS 62 56 56 79  BILITOT 0.5 0.6  --  0.7  PROT 6.3* 5.7*  --  6.3*  ALBUMIN 3.0* 2.9*  --  3.1*   Recent Labs    03/17/18 1604  05/02/18 0402 05/11/18 05/19/18 2000 05/21/18 0815 05/29/18  WBC 17.1*   < > 9.2 12.1 10.0 12.0*  --   NEUTROABS 12.8*  --   --   --  7.9* 9.1*  --   HGB 11.3*   < > 9.9* 10.8* 11.2* 10.4* 9.6*  HCT 34.5*   < > 30.2* 32* 35.8* 31.9* 29*  MCV 93.8   < > 93.8  --  94.7 94.4  --   PLT 253   < > 348 282 248 289 339   < > = values in this interval not displayed.   Lab Results  Component Value Date   TSH 2.84 07/06/2018   No results found for: HGBA1C No results found for: CHOL, HDL, LDLCALC, LDLDIRECT, TRIG, CHOLHDL   Significant Diagnostic Results in last 30 days:  No results found.  Assessment/Plan Chronic diastolic congestive heart failure ([place]) - Plan:  Continue Demadex  Keep Weight below 100 lbs Repeat BMP in 1 week to follow Renal Function  t atrial fibrillation - Plan:  On Amiodarone, Cardizem and Lopressor Still stays Tachycardic but Below 100 Seemed in Normal Rhythm right now On Low dose of Eliquis  Pleural effusion due to CHF S/PThoracocentesis in hospital  Repeat Chest Xrayin facility shows Moderate Effusion in Right lower lobe Will repeat Chest Xray  Continue Diuresis Possible suspicious Upper right Lobe nodule ? Bronchogenic carcinoma Patient is aware and does not want any treatment right now  Chronic obstructive pulmonary disease,  On 4 l of Oxygen On Spiriva and Advair Hypothyroidism - Plan:  TSH normal on this dose Repeat in 8 weeks  CKD (chronic kidney disease) stage 3, GFR 30-59 ml/min (HCC) - Plan:  Repeat BMP for Renal Fucntion BUN iwas up in Last BMP but due to her Effusion  continue same dose Anemia Hgb Is low but stable     Family/ staff Communication:   Labs/tests ordered:  BMP, CBC, Chest Xray  Total time spent in this patient care encounter was  25_  minutes; greater than 50% of the visit spent counseling patient and staff, reviewing records , Labs and coordinating care for problems addressed at this encounter.

## 2018-08-01 DIAGNOSIS — J9620 Acute and chronic respiratory failure, unspecified whether with hypoxia or hypercapnia: Secondary | ICD-10-CM | POA: Diagnosis not present

## 2018-08-02 DIAGNOSIS — Z20828 Contact with and (suspected) exposure to other viral communicable diseases: Secondary | ICD-10-CM | POA: Diagnosis not present

## 2018-08-02 DIAGNOSIS — J189 Pneumonia, unspecified organism: Secondary | ICD-10-CM | POA: Diagnosis not present

## 2018-08-03 DIAGNOSIS — E039 Hypothyroidism, unspecified: Secondary | ICD-10-CM | POA: Diagnosis not present

## 2018-08-10 ENCOUNTER — Non-Acute Institutional Stay (SKILLED_NURSING_FACILITY): Payer: Medicare Other | Admitting: Internal Medicine

## 2018-08-10 ENCOUNTER — Encounter: Payer: Self-pay | Admitting: Internal Medicine

## 2018-08-10 DIAGNOSIS — J9612 Chronic respiratory failure with hypercapnia: Secondary | ICD-10-CM | POA: Diagnosis not present

## 2018-08-10 DIAGNOSIS — R42 Dizziness and giddiness: Secondary | ICD-10-CM | POA: Diagnosis not present

## 2018-08-10 DIAGNOSIS — N183 Chronic kidney disease, stage 3 unspecified: Secondary | ICD-10-CM

## 2018-08-10 DIAGNOSIS — I4819 Other persistent atrial fibrillation: Secondary | ICD-10-CM

## 2018-08-10 DIAGNOSIS — I5032 Chronic diastolic (congestive) heart failure: Secondary | ICD-10-CM

## 2018-08-10 NOTE — Progress Notes (Signed)
Location: [employer] features editor of Service:  SNF (31)  Provider:   Code Status:  Goals of Care:  Advanced Directives 07/28/2018  Does Patient Have a Medical Advance Directive? Yes  Type of Paramedic of [place];Out of facility DNR (pink MOST or yellow form)  Does patient want to make changes to medical advance directive? No - Patient declined  Copy of [place] in Chart? No - copy requested  Would patient like information on creating a medical advance directive? No - Patient declined  Pre-existing out of facility DNR order (yellow form or pink MOST form) Pink MOST form placed in chart (order not valid for inpatient use)     Chief Complaint  Patient presents with  . Acute Visit    HPI: Patient is a 85 y.o. female seen today for an acute visit for Feeling Dizzy and Light headed Patient has PMH ofTIA in 2010 s/p CEA in 2010, COPD on oxygen at night, Hypertension,GERD,Hypothyroidism, Gastritis and Paget's disease of Nipple  And Recently she has been diagnosed withNew onset Atrial Fibrillation, CHF, bilateral pleural effusions requiring thoracocentesis and right upper lobe nodule suspicious of cancer. Patient has been in the hospital number of times due to right pleural effusion and CHF.    We have been trying to keep her on the drier side her weight less than 100 pounds Her BMP had shown that her BUN and creatinine was up her weight down to 93 pounds. Today patient was complaining of feeling dizzy and lightheaded is working with therapy Her blood pressure was 88/46 she did have mild shortness of breath.  But no coughing chest pain or fever  Past Medical History:  Diagnosis Date  . Arthritis   . Constipation   . COPD (chronic obstructive pulmonary disease) ([place])   . Fatigue   . Frequent UTI   . GERD (gastroesophageal reflux disease)   . History of shingles 2007  . Hyperlipemia   . Hypertension   . Hypothyroidism   .  Multiple allergies   . Osteoporosis   . Paget's carcinoma of the nipple ([place]) 03/16/2013  . Pneumonia   . Seasonal allergies   . Stroke [facility]) 2010   no residual  . Thyroid disease   . Uterine cancer [facility])     Past Surgical History:  Procedure Laterality Date  . ABDOMINAL HYSTERECTOMY    . BREAST LUMPECTOMY     left breast  . CAROTID ENDARTERECTOMY Left June 10, 2008   CE  . EYE SURGERY     cataracts removed bilaterally  . TONSILLECTOMY      Allergies  Allergen Reactions  . Sulfa Antibiotics Shortness Of Breath and Palpitations  . Sulfamethoxazole Shortness Of Breath and Palpitations  . Sulfur Shortness Of Breath and Palpitations    "Allergic," per MAR (although I believe they might've meant "Sulfa"??)  . Amoxicillin Rash  . Penicillin G Rash    Did it involve swelling of the face/tongue/throat, SOB, or low BP? Yes Did it involve sudden or severe rash/hives, skin peeling, or any reaction on the inside of your mouth or nose? Unk Did you need to seek medical attention at a hospital or doctor's office? Unk When did it last happen? "it was a long time ago" If all above answers are "NO", may proceed with cephalosporin use.   Marland Kitchen Penicillins Rash    Did it involve swelling of the face/tongue/throat, SOB, or low BP? Yes Did it involve sudden or  severe rash/hives, skin peeling, or any reaction on the inside of your mouth or nose? Unk Did you need to seek medical attention at a hospital or doctor's office? Unk When did it last happen? "it was a long time ago" If all above answers are "NO", may proceed with cephalosporin use.   . Tape Other (See Comments)    SKIN IS VERY FRAGILE- PLEASE USE AN ALTERNATIVE TO TAPE, AS THE SKIN TEARS EASILY!!    Outpatient Encounter Medications as of 08/10/2018  Medication Sig  . amiodarone (PACERONE) 200 MG tablet Take 200 mg by mouth daily.  Marland Kitchen apixaban (ELIQUIS) 2.5 MG TABS tablet Take 1 tablet (2.5 mg total) by mouth 2 (two) times daily.  Marland Kitchen  diltiazem (CARDIZEM CD) 120 MG 24 hr capsule Take 1 capsule (120 mg total) by mouth daily.  . ergocalciferol (VITAMIN D2) 1.25 MG (50000 UT) capsule Take 1,250 Units by mouth once a week. Once a day on Sunday  . fluticasone (FLONASE) 50 MCG/ACT nasal spray Place 2 sprays into both nostrils daily.   . Fluticasone-Salmeterol (ADVAIR) 250-50 MCG/DOSE AEPB Inhale 1 puff into the lungs 2 (two) times a day.   . ipratropium-albuterol (DUONEB) 0.5-2.5 (3) MG/3ML SOLN Take 3 mLs by nebulization every 6 (six) hours as needed (WHEEZING).  Marland Kitchen levothyroxine (SYNTHROID) 100 MCG tablet Take 100 mcg by mouth daily before breakfast.  . metoprolol tartrate (LOPRESSOR) 25 MG tablet Take 12.5 mg by mouth daily.   . Multiple Vitamins-Minerals (CENTRUM SILVER 50+WOMEN) TABS Take 1 tablet by mouth daily with breakfast.  . Multiple Vitamins-Minerals (ICAPS AREDS 2 PO) Take 1 tablet by mouth daily.  . Nutritional Supplements (RESOURCE SUPPORT) LIQD Take 90 mLs by mouth 3 (three) times daily between meals.   . OXYGEN Inhale 2 L into the lungs continuous. SAT MUST BE >90%  . pantoprazole (PROTONIX) 40 MG tablet Take 40 mg by mouth daily.   . potassium chloride (KLOR-CON) 20 MEQ packet Take 20 mEq by mouth 2 (two) times daily.  . sennosides-docusate sodium (SENOKOT-S) 8.6-50 MG tablet Take 1 tablet by mouth 2 (two) times a day.  . tiotropium (SPIRIVA) 18 MCG inhalation capsule Place 18 mcg into inhaler and inhale daily.  Marland Kitchen torsemide (DEMADEX) 20 MG tablet Take 20 mg by mouth daily. On Monday, Wednesday, and Friday  . torsemide (DEMADEX) 20 MG tablet Take 20 mg by mouth daily.  Marland Kitchen zinc oxide 20 % ointment Apply 1 application topically as needed for irritation. Apply to buttocks after every incontinent episode and as needed for redness   No facility-administered encounter medications on file as of 08/10/2018.     Review of Systems:  Review of Systems  Constitutional: Positive for activity change.  HENT: Negative.    Respiratory: Positive for shortness of breath.   Cardiovascular: Negative.   Gastrointestinal: Negative.   Genitourinary: Negative.   Musculoskeletal: Negative.   Skin: Negative.   Neurological: Positive for weakness and light-headedness.  Psychiatric/Behavioral: Negative.     Health Maintenance  Topic Date Due  . TETANUS/TDAP  10/03/1936  . PNA vac Low Risk Adult (1 of 2 - PCV13) 10/02/1984  . INFLUENZA VACCINE  09/16/2018  . DEXA SCAN  Completed    Physical Exam: Vitals:   08/10/18 1554  BP: (!) 88/46  Pulse: 78  Temp: 97.6 F (36.4 C)  SpO2: 97%  Weight: 93 lb (42.2 kg)   Body mass index is 19.44 kg/m. Physical Exam Vitals signs reviewed.  Constitutional:      Appearance:  Normal appearance.  HENT:     Head: Normocephalic.     Nose: Nose normal.     Mouth/Throat:     Mouth: Mucous membranes are moist.     Pharynx: Oropharynx is clear.  Eyes:     Pupils: Pupils are equal, round, and reactive to light.  Neck:     Musculoskeletal: Neck supple.  Cardiovascular:     Rate and Rhythm: Normal rate.     Pulses: Normal pulses.  Pulmonary:     Effort: Pulmonary effort is normal. No respiratory distress.     Breath sounds: Normal breath sounds. No wheezing, rhonchi or rales.  Abdominal:     General: Abdomen is flat.     Palpations: Abdomen is soft.  Musculoskeletal:        General: No swelling.  Skin:    General: Skin is dry.  Neurological:     General: No focal deficit present.     Mental Status: She is alert and oriented to person, place, and time.  Psychiatric:        Mood and Affect: Mood normal.        Thought Content: Thought content normal.     Labs reviewed: Basic Metabolic Panel: Recent Labs    03/17/18 1753  04/27/18 1523  05/01/18 0545 05/02/18 0402  05/21/18 0815 05/22/18 6270 05/23/18 0326 05/29/18 06/08/18 06/26/18 06/29/18 07/06/18  NA  --    < >  --    < > 134* 133*   < > 138 137 136 138 137  --  135*  --   K  --    < >  --    < >  3.9 4.4   < > 3.8 3.3* 3.0* 4.8 4.8  --  4.4  --   CL  --    < >  --    < > 95* 96*   < > 95* 93* 93*  --  98  --   --   --   CO2  --    < >  --    < > 29 27   < > 33* 35* 33*  --  32  --   --   --   GLUCOSE  --    < >  --    < > 140* 131*   < > 96 95 94  --   --   --   --   --   BUN  --    < >  --    < > 34* 43*   < > 26* 26* 25* 37* 38*  --  45*  --   CREATININE  --    < >  --    < > 1.39* 1.50*   < > 1.48* 1.32* 1.17* 1.6* 1.5*  --  1.9*  --   CALCIUM  --    < >  --    < > 8.9 8.7*   < > 9.1 8.6* 8.5*  --  8.7  --   --   --   MG 1.7  --   --   --  2.0 1.9  --   --   --   --   --   --   --   --   --   TSH  --    < > 10.029*  --   --   --   --   --   --   --   --   --  1.16  --  2.84   < > = values in this interval not displayed.   Liver Function Tests: Recent Labs    05/01/18 0545 05/02/18 0402 05/11/18 05/19/18 2000  AST 19 23 11* 19  ALT 19 24 12 19   ALKPHOS 62 56 56 79  BILITOT 0.5 0.6  --  0.7  PROT 6.3* 5.7*  --  6.3*  ALBUMIN 3.0* 2.9*  --  3.1*   No results for input(s): LIPASE, AMYLASE in the last 8760 hours. No results for input(s): AMMONIA in the last 8760 hours. CBC: Recent Labs    03/17/18 1604  05/02/18 0402 05/11/18 05/19/18 2000 05/21/18 0815 05/29/18  WBC 17.1*   < > 9.2 12.1 10.0 12.0*  --   NEUTROABS 12.8*  --   --   --  7.9* 9.1*  --   HGB 11.3*   < > 9.9* 10.8* 11.2* 10.4* 9.6*  HCT 34.5*   < > 30.2* 32* 35.8* 31.9* 29*  MCV 93.8   < > 93.8  --  94.7 94.4  --   PLT 253   < > 348 282 248 289 339   < > = values in this interval not displayed.   Lipid Panel: No results for input(s): CHOL, HDL, LDLCALC, TRIG, CHOLHDL, LDLDIRECT in the last 8760 hours. No results found for: HGBA1C  Procedures since last visit: No results found.  Assessment/Plan Lightheadedness With her low blood pressure can be that patient is little dehydrated. Weight is also low and her BUN is 54 creatinine of 1.75 We will decrease the dose of Demadex to 20 mg daily Check Her BP  Q shift for 24 hours Hold her Lopressor if systolic blood pressure less than 90   Chronic diastolic congestive heart failure (HCC) - Plan:  We will decrease the dose of Demadex 20 mg daily Continue to watch her weight  Atrial fibrillation - Plan:  On Amiodarone, Cardizem and Lopressor Still stays Tachycardic but Below 100 Seemed in Normal Rhythm right now On Low dose of Eliquis Hold her Lopressor if systolic blood pressure less than 90 Pleural effusion due to CHF S/PThoracocentesis in hospital  Repeat Chest [facility] facility shows pleural effusion is very mild.  She continues to have bilateral infiltrates Continue Diuresis Possible suspicious Upper right Lobe nodule ? Bronchogenic carcinoma Patient is aware and does not want any treatment right now Chronic obstructive pulmonary disease,  On 4 l of Oxygen On Spiriva and Advair Hypothyroidism - Plan:  TSH normal on this dose Repeat in 8 weeks Pending CKD (chronic kidney disease) stage 3, GFR 30-59 ml/min (HCC) - Plan:  Repeat BMP for Renal Function shows some worsening of creat Most likely due ot Diuresis Repeat BMP in 2 weeks Anemia Hgb Is low but stable   Labs/tests ordered:  BMP in 2 weeks

## 2018-08-12 DIAGNOSIS — Z03818 Encounter for observation for suspected exposure to other biological agents ruled out: Secondary | ICD-10-CM | POA: Diagnosis not present

## 2018-08-17 DIAGNOSIS — I130 Hypertensive heart and chronic kidney disease with heart failure and stage 1 through stage 4 chronic kidney disease, or unspecified chronic kidney disease: Secondary | ICD-10-CM | POA: Diagnosis not present

## 2018-08-18 LAB — BASIC METABOLIC PANEL
BUN: 54 — AB (ref 4–21)
Creatinine: 1.8 — AB (ref 0.5–1.1)
Glucose: 82
Potassium: 4 (ref 3.4–5.3)
Sodium: 137 (ref 137–147)

## 2018-08-21 DIAGNOSIS — E039 Hypothyroidism, unspecified: Secondary | ICD-10-CM | POA: Diagnosis not present

## 2018-08-29 ENCOUNTER — Encounter: Payer: Self-pay | Admitting: Nurse Practitioner

## 2018-08-29 ENCOUNTER — Non-Acute Institutional Stay (SKILLED_NURSING_FACILITY): Payer: Medicare Other | Admitting: Nurse Practitioner

## 2018-08-29 DIAGNOSIS — K5901 Slow transit constipation: Secondary | ICD-10-CM | POA: Diagnosis not present

## 2018-08-29 DIAGNOSIS — K644 Residual hemorrhoidal skin tags: Secondary | ICD-10-CM

## 2018-08-29 DIAGNOSIS — N183 Chronic kidney disease, stage 3 unspecified: Secondary | ICD-10-CM

## 2018-08-29 DIAGNOSIS — E032 Hypothyroidism due to medicaments and other exogenous substances: Secondary | ICD-10-CM

## 2018-08-29 DIAGNOSIS — K219 Gastro-esophageal reflux disease without esophagitis: Secondary | ICD-10-CM | POA: Diagnosis not present

## 2018-08-29 DIAGNOSIS — I4819 Other persistent atrial fibrillation: Secondary | ICD-10-CM | POA: Diagnosis not present

## 2018-08-29 DIAGNOSIS — J449 Chronic obstructive pulmonary disease, unspecified: Secondary | ICD-10-CM

## 2018-08-29 DIAGNOSIS — I5032 Chronic diastolic (congestive) heart failure: Secondary | ICD-10-CM | POA: Diagnosis not present

## 2018-08-29 DIAGNOSIS — I10 Essential (primary) hypertension: Secondary | ICD-10-CM | POA: Diagnosis not present

## 2018-08-29 NOTE — Assessment & Plan Note (Signed)
Blood pressure is controlled

## 2018-08-29 NOTE — Assessment & Plan Note (Signed)
No injury or s/s of infection of the skin tag near anus at 9pm

## 2018-08-29 NOTE — Assessment & Plan Note (Signed)
Stable, continue Senokot S bid.

## 2018-08-29 NOTE — Assessment & Plan Note (Signed)
At her baseline, last creat 1.8 08/18/18

## 2018-08-29 NOTE — Assessment & Plan Note (Signed)
Stable, continue Pantoprazole 40mg qd.  

## 2018-08-29 NOTE — Progress Notes (Addendum)
Location:   [facility] [place] Room Number: 56 Place of Service:  SNF (31) Provider: Lennie Odor Mast NP  [name], MD  Patient Care Team: [name], MD as PCP - General (Internal Medicine) Debara Pickett Nadean Corwin, MD as PCP - Cardiology (Cardiology) Nicholas Lose, MD as Consulting Physician (Hematology and Oncology)  Extended Emergency Contact Information Primary Emergency Contact: Little Ishikawa Mobile Phone: [phone] Relation: Niece Secondary Emergency Contact: Cromer,Chad Mobile Phone: [phone] Relation: Nephew  Code Status:  DNR Goals of care: Advanced Directive information Advanced Directives 08/29/2018  Does Patient Have a Medical Advance Directive? Yes  Type of Advance Directive Out of facility DNR (pink MOST or yellow form);Healthcare Power of Attorney  Does patient want to make changes to medical advance directive? No - Patient declined  Copy of [place] in Chart? No - copy requested  Would patient like information on creating a medical advance directive? No - Patient declined  Pre-existing out of facility DNR order (yellow form or pink MOST form) Yellow form placed in chart (order not valid for inpatient use);Pink MOST form placed in chart (order not valid for inpatient use)     Chief Complaint  Patient presents with  . Medical Management of Chronic Issues    routine visit   . Health Maintenance    tetanus vaccine, PCV13    HPI:  Pt is a 85 y.o. female seen today for medical management of chronic diseases.    The patient has history for CKD, last creat 1.8 08/18/18, eGFR 28 06/08/18. Hx of hypothyroidism, last TSH 2.84 07/06/18, on Levothyroxine 123mg qd. CHF, O2 dependent, trace edema BLE, chronic venous insufficiency skin changes BLE, on Torsemide 230mqd. COPD, stable on Spiriva 18102mqd, Duoneb q6h prn, Advair bid. Constipation, stable, on Senokot S bid. GERD stable on Pantoprazole 41m28m. HTN,  blood pressure is controlled, Afib,  heart rate is controlled  on Metoprolol 12.5mg 31m Cardizem 120mg 46mand Amiodarone 200mg q45mshe takes Eliquis 2.5mg bid37mr thromboembolic risk reduction.    Past Medical History:  Diagnosis Date  . Arthritis   . Constipation   . COPD (chronic obstructive pulmonary disease) (HCC)   .[place]   . Frequent UTI   . GERD (gastroesophageal reflux disease)   . History of shingles 2007  . Hyperlipemia   . Hypertension   . Hypothyroidism   . Multiple allergies   . Osteoporosis   . Paget's carcinoma of the nipple (HCC) 1/3[place]015  . Pneumonia   . Seasonal allergies   . Stroke (HCC) 20[facility] no residual  . Thyroid disease   . Uterine cancer (HCC)   [facility]:  Procedure Laterality Date  . ABDOMINAL HYSTERECTOMY    . BREAST LUMPECTOMY     left breast  . CAROTID ENDARTERECTOMY Left June 10, 2008   CE  . EYE SURGERY     cataracts removed bilaterally  . TONSILLECTOMY      Allergies  Allergen Reactions  . Sulfa Antibiotics Shortness Of Breath and Palpitations  . Sulfamethoxazole Shortness Of Breath and Palpitations  . Sulfur Shortness Of Breath and Palpitations    "Allergic," per MAR (although I believe they might've meant "Sulfa"??)  . Amoxicillin Rash  . Penicillin G Rash    Did it involve swelling of the face/tongue/throat, SOB, or low BP? Yes Did it involve sudden or severe rash/hives, skin peeling, or any reaction on the inside of your mouth or nose? Unk  Did you need to seek medical attention at a hospital or doctor's office? Unk When did it last happen? "it was a long time ago" If all above answers are "NO", may proceed with cephalosporin use.   Marland Kitchen Penicillins Rash    Did it involve swelling of the face/tongue/throat, SOB, or low BP? Yes Did it involve sudden or severe rash/hives, skin peeling, or any reaction on the inside of your mouth or nose? Unk Did you need to seek medical attention at a hospital or doctor's office? Unk When did it last happen? "it  was a long time ago" If all above answers are "NO", may proceed with cephalosporin use.   . Tape Other (See Comments)    SKIN IS VERY FRAGILE- PLEASE USE AN ALTERNATIVE TO TAPE, AS THE SKIN TEARS EASILY!!    Allergies as of 08/29/2018      Reactions   Sulfa Antibiotics Shortness Of Breath, Palpitations   Sulfamethoxazole Shortness Of Breath, Palpitations   Sulfur Shortness Of Breath, Palpitations   "Allergic," per MAR (although I believe they might've meant "Sulfa"??)   Amoxicillin Rash   Penicillin G Rash   Did it involve swelling of the face/tongue/throat, SOB, or low BP? Yes Did it involve sudden or severe rash/hives, skin peeling, or any reaction on the inside of your mouth or nose? Unk Did you need to seek medical attention at a hospital or doctor's office? Unk When did it last happen? "it was a long time ago" If all above answers are "NO", may proceed with cephalosporin use.   Penicillins Rash   Did it involve swelling of the face/tongue/throat, SOB, or low BP? Yes Did it involve sudden or severe rash/hives, skin peeling, or any reaction on the inside of your mouth or nose? Unk Did you need to seek medical attention at a hospital or doctor's office? Unk When did it last happen? "it was a long time ago" If all above answers are "NO", may proceed with cephalosporin use.   Tape Other (See Comments)   SKIN IS VERY FRAGILE- PLEASE USE AN ALTERNATIVE TO TAPE, AS THE SKIN TEARS EASILY!!      Medication List       Accurate as of August 29, 2018  2:38 PM. If you have any questions, ask your nurse or doctor.        amiodarone 200 MG tablet Commonly known as: PACERONE Take 200 mg by mouth daily.   apixaban 2.5 MG Tabs tablet Commonly known as: ELIQUIS Take 1 tablet (2.5 mg total) by mouth 2 (two) times daily.   Demadex 20 MG tablet Generic drug: torsemide Take 20 mg by mouth daily.   diltiazem 120 MG 24 hr capsule Commonly known as: CARDIZEM CD Take 1 capsule (120 mg  total) by mouth daily.   ergocalciferol 1.25 MG (50000 UT) capsule Commonly known as: VITAMIN D2 Take 1,250 Units by mouth once a week. Once a day on Sunday   fluticasone 50 MCG/ACT nasal spray Commonly known as: FLONASE Place 2 sprays into both nostrils daily.   Fluticasone-Salmeterol 250-50 MCG/DOSE Aepb Commonly known as: ADVAIR Inhale 1 puff into the lungs 2 (two) times a day.   ICAPS AREDS 2 PO Take 1 tablet by mouth daily.   Centrum Silver 50+Women Tabs Take 1 tablet by mouth daily with breakfast.   ipratropium-albuterol 0.5-2.5 (3) MG/3ML Soln Commonly known as: DUONEB Take 3 mLs by nebulization every 6 (six) hours as needed (WHEEZING).   levothyroxine 100 MCG tablet Commonly known  as: SYNTHROID Take 100 mcg by mouth daily before breakfast.   metoprolol tartrate 25 MG tablet Commonly known as: LOPRESSOR Take 12.5 mg by mouth daily.   OXYGEN Inhale 2 L into the lungs continuous. SAT MUST BE >90%   pantoprazole 40 MG tablet Commonly known as: PROTONIX Take 40 mg by mouth daily.   potassium chloride 20 MEQ packet Commonly known as: KLOR-CON Take 20 mEq by mouth daily.   Resource Support Liqd Take 120 mLs by mouth 3 (three) times daily between meals.   sennosides-docusate sodium 8.6-50 MG tablet Commonly known as: SENOKOT-S Take 1 tablet by mouth 2 (two) times a day.   tiotropium 18 MCG inhalation capsule Commonly known as: SPIRIVA Place 18 mcg into inhaler and inhale daily.   zinc oxide 20 % ointment Apply 1 application topically as needed for irritation. Apply to buttocks after every incontinent episode and as needed for redness      ROS was provided with assistance of staff Review of Systems  Constitutional: Negative for activity change, appetite change, chills, diaphoresis, fatigue, fever and unexpected weight change.  HENT: Positive for hearing loss. Negative for congestion and voice change.   Eyes: Negative for visual disturbance.  Respiratory:  Positive for shortness of breath. Negative for cough and wheezing.   Cardiovascular: Positive for leg swelling. Negative for chest pain and palpitations.  Gastrointestinal: Negative for abdominal distention, abdominal pain, constipation, diarrhea, nausea and vomiting.  Genitourinary: Negative for difficulty urinating, dysuria and urgency.  Musculoskeletal: Positive for gait problem.  Skin: Positive for color change. Negative for pallor.  Neurological: Negative for dizziness, speech difficulty, weakness and headaches.       Memory lapses.   Psychiatric/Behavioral: Negative for agitation, behavioral problems, hallucinations and sleep disturbance. The patient is not nervous/anxious.     Immunization History  Administered Date(s) Administered  . Influenza-Unspecified 11/15/2013  . PPD Test 07/24/2011  . Zoster Recombinat (Shingrix) 12/10/2017   Pertinent  Health Maintenance Due  Topic Date Due  . PNA vac Low Risk Adult (1 of 2 - PCV13) 10/02/1984  . INFLUENZA VACCINE  09/16/2018  . DEXA SCAN  Completed   Fall Risk  01/28/2014  Falls in the past year? Yes  Number falls in past yr: 1  Injury with Fall? No   Functional Status Survey:    Vitals:   08/29/18 0937  BP: (!) 106/56  Pulse: 96  Resp: 20  Temp: (!) 97.5 F (36.4 C)  SpO2: 99%  Weight: 94 lb 9.6 oz (42.9 kg)  Height: _0  (1.473 m)   Body mass index is 19.77 kg/m. Physical Exam Vitals signs and nursing note reviewed.  Constitutional:      General: She is not in acute distress.    Appearance: Normal appearance. She is normal weight. She is not ill-appearing, toxic-appearing or diaphoretic.  HENT:     Head: Normocephalic and atraumatic.     Nose: Nose normal.     Mouth/Throat:     Mouth: Mucous membranes are moist.  Eyes:     Extraocular Movements: Extraocular movements intact.     Conjunctiva/sclera: Conjunctivae normal.     Pupils: Pupils are equal, round, and reactive to light.  Neck:      Musculoskeletal: Normal range of motion and neck supple.  Cardiovascular:     Rate and Rhythm: Normal rate and regular rhythm.     Heart sounds: No murmur.  Pulmonary:     Breath sounds: No wheezing, rhonchi or rales.  Abdominal:  General: Bowel sounds are normal. There is no distension.     Palpations: Abdomen is soft.     Tenderness: There is no abdominal tenderness. There is no right CVA tenderness, left CVA tenderness, guarding or rebound.  Genitourinary:    Comments: A small skin tag near anus at 9pm Musculoskeletal:     Right lower leg: Edema present.     Left lower leg: Edema present.     Comments: Trace edema BLE. W/c for mobility. The right bunion, 2nd hammer toe sat on the 1st right toe  Skin:    General: Skin is warm and dry.     Comments: Chronic brownish venous insufficiency skin changes BLE  Neurological:     General: No focal deficit present.     Mental Status: She is alert and oriented to person, place, and time. Mental status is at baseline.     Motor: No weakness.     Coordination: Coordination normal.     Gait: Gait abnormal.     Comments: W/c for mobility.   Psychiatric:        Mood and Affect: Mood normal.        Behavior: Behavior normal.        Thought Content: Thought content normal.        Judgment: Judgment normal.     Labs reviewed: Recent Labs    03/17/18 1753  05/01/18 0545 05/02/18 0402  05/21/18 0815 05/22/18 1245 05/23/18 0326  06/08/18 06/29/18 08/18/18  NA  --    < > 134* 133*   < > 138 137 136   < > 137 135* 137  K  --    < > 3.9 4.4   < > 3.8 3.3* 3.0*   < > 4.8 4.4 4.0  CL  --    < > 95* 96*   < > 95* 93* 93*  --  98  --   --   CO2  --    < > 29 27   < > 33* 35* 33*  --  32  --   --   GLUCOSE  --    < > 140* 131*   < > 96 95 94  --   --   --   --   BUN  --    < > 34* 43*   < > 26* 26* 25*   < > 38* 45* 54*  CREATININE  --    < > 1.39* 1.50*   < > 1.48* 1.32* 1.17*   < > 1.5* 1.9* 1.8*  CALCIUM  --    < > 8.9 8.7*   < > 9.1 8.6*  8.5*  --  8.7  --   --   MG 1.7  --  2.0 1.9  --   --   --   --   --   --   --   --    < > = values in this interval not displayed.   Recent Labs    05/01/18 0545 05/02/18 0402 05/11/18 05/19/18 2000  AST 19 23 11* 19  ALT _0 ALKPHOS 62 56 56 79  BILITOT 0.5 0.6  --  0.7  PROT 6.3* 5.7*  --  6.3*  ALBUMIN 3.0* 2.9*  --  3.1*   Recent Labs    03/17/18 1604  05/02/18 0402 05/11/18 05/19/18 2000 05/21/18 0815 05/29/18  WBC 17.1*   < > 9.2 12.1 10.0 12.0*  --  NEUTROABS 12.8*  --   --   --  7.9* 9.1*  --   HGB 11.3*   < > 9.9* 10.8* 11.2* 10.4* 9.6*  HCT 34.5*   < > 30.2* 32* 35.8* 31.9* 29*  MCV 93.8   < > 93.8  --  94.7 94.4  --   PLT 253   < > 348 282 248 289 339   < > = values in this interval not displayed.   Lab Results  Component Value Date   TSH 2.84 07/06/2018   No results found for: HGBA1C No results found for: CHOL, HDL, LDLCALC, LDLDIRECT, TRIG, CHOLHDL  Significant Diagnostic Results in last 30 days:  No results found.  Assessment/Plan Atrial fibrillation (HCC) Heart rate is in control, continue Amiodarone 282m qd, Metoprolol 12.549mqd, Diltiazem 12042md. Continue Eliquis 2.5mg15md pro for thromboembolic risk reduction.   Diastolic CHF (HCC) Compensated, O2 dependent, continue Torsemide 40mg48m   Hypertension Blood pressure is controlled.   COPD (chronic obstructive pulmonary disease) (HCC) Stable, continue Spiriva 18mcg50m Duoneb q6h prn, Advair bid.  Hypothyroidism Stable, continue Levothyroxine 100mcg 68mlast TSH 2.84 07/06/18  CKD (chronic kidney disease) stage 3, GFR 30-59 ml/min (HCC) At her baseline, last creat 1.8 08/18/18  Slow transit constipation Stable, continue Senokot S bid.   GERD (gastroesophageal reflux disease) Stable, continue Pantoprazole 40mg qd9mSkin tag of anus No injury or s/s of infection of the skin tag near anus at 9pm   Family/ staff Communication: plan of care reviewed with the patient and charge  nurse.   Labs/tests ordered: none  Time spend 25 minutes.

## 2018-08-29 NOTE — Assessment & Plan Note (Signed)
Stable, continue Spiriva 25mcg qd, Duoneb q6h prn, Advair bid.

## 2018-08-29 NOTE — Assessment & Plan Note (Signed)
Heart rate is in control, continue Amiodarone 200mg  qd, Metoprolol 12.5mg  qd, Diltiazem 120mg  qd. Continue Eliquis 2.5mg  bid pro for thromboembolic risk reduction.

## 2018-08-29 NOTE — Assessment & Plan Note (Signed)
Stable, continue Levothyroxine 110mcg qd, last TSH 2.84 07/06/18

## 2018-08-29 NOTE — Assessment & Plan Note (Signed)
Compensated, O2 dependent, continue Torsemide 40mg  qd.

## 2018-09-08 ENCOUNTER — Ambulatory Visit (INDEPENDENT_AMBULATORY_CARE_PROVIDER_SITE_OTHER): Payer: Medicare Other | Admitting: Podiatry

## 2018-09-08 ENCOUNTER — Encounter: Payer: Self-pay | Admitting: Podiatry

## 2018-09-08 ENCOUNTER — Other Ambulatory Visit: Payer: Self-pay

## 2018-09-08 DIAGNOSIS — M79676 Pain in unspecified toe(s): Secondary | ICD-10-CM | POA: Diagnosis not present

## 2018-09-08 DIAGNOSIS — B351 Tinea unguium: Secondary | ICD-10-CM

## 2018-09-08 NOTE — Progress Notes (Signed)
Complaint:  Visit Type: Patient returns to my office for continued preventative foot care services. Complaint: Patient states" my nails have grown long and thick and become painful to walk and wear shoes".  Patient presents to the office for nail care with a caregiver.  She has been sick with pneumonia and is now in a wheelchair.  The patient presents for preventative foot care services. No changes to ROS  Podiatric Exam: Vascular: dorsalis pedis and posterior tibial pulses are palpable bilateral. Capillary return is immediate. Temperature gradient is WNL. Skin turgor WNL  Sensorium: Normal Semmes Weinstein monofilament test. Normal tactile sensation bilaterally. Nail Exam: Pt has thick disfigured discolored nails with subungual debris noted bilateral entire nail hallux through fifth toenails Ulcer Exam: There is no evidence of ulcer or pre-ulcerative changes or infection. Orthopedic Exam: Muscle tone and strength are WNL. No limitations in general ROM. No crepitus or effusions noted. Foot type and digits show no abnormalities. HAV 1st MPJ  B/L with hammer toes. Skin: No Porokeratosis. No infection or ulcers.  Forefoot porokeratosis  resolved  Diagnosis:  Onychomycosis, , Pain in right toe, pain in left toes  Treatment & Plan Procedures and Treatment: Consent by patient was obtained for treatment procedures.   Debridement of mycotic and hypertrophic toenails, 1 through 5 bilateral and clearing of subungual debris. No ulceration, no infection noted.  Return Visit-Office Procedure: Patient instructed to return to the office for a follow up visit 3 months for continued evaluation and treatment.    Gardiner Barefoot DPM

## 2018-09-12 ENCOUNTER — Non-Acute Institutional Stay (SKILLED_NURSING_FACILITY): Payer: Medicare Other | Admitting: Nurse Practitioner

## 2018-09-12 ENCOUNTER — Encounter: Payer: Self-pay | Admitting: Nurse Practitioner

## 2018-09-12 DIAGNOSIS — I4819 Other persistent atrial fibrillation: Secondary | ICD-10-CM

## 2018-09-12 DIAGNOSIS — G8929 Other chronic pain: Secondary | ICD-10-CM

## 2018-09-12 DIAGNOSIS — I5032 Chronic diastolic (congestive) heart failure: Secondary | ICD-10-CM | POA: Diagnosis not present

## 2018-09-12 DIAGNOSIS — G459 Transient cerebral ischemic attack, unspecified: Secondary | ICD-10-CM | POA: Diagnosis not present

## 2018-09-12 DIAGNOSIS — M25561 Pain in right knee: Secondary | ICD-10-CM | POA: Diagnosis not present

## 2018-09-12 NOTE — Assessment & Plan Note (Signed)
Heart rate is in control, continue Metoprolol, Cardizem, Eliquis.

## 2018-09-12 NOTE — Assessment & Plan Note (Signed)
Compensated clinically, continue Torsemide

## 2018-09-12 NOTE — Progress Notes (Signed)
Location:   [facility] Room Number: 58 Place of Service:  SNF (31) Provider: Lennie Odor Meredyth Hornung NP  [name], MD  Patient Care Team: [name], MD as PCP - General (Internal Medicine) Debara Pickett Nadean Corwin, MD as PCP - Cardiology (Cardiology) Nicholas Lose, MD as Consulting Physician (Hematology and Oncology)  Extended Emergency Contact Information Primary Emergency Contact: Little Ishikawa Mobile Phone: [phone] Relation: Niece Secondary Emergency Contact: Cromer,Chad Mobile Phone: [phone] Relation: Nephew  Code Status:  DNR Goals of care: Advanced Directive information Advanced Directives 09/12/2018  Does Patient Have a Medical Advance Directive? Yes  Type of Advance Directive Out of facility DNR (pink MOST or yellow form);Healthcare Power of Attorney  Does patient want to make changes to medical advance directive? No - Patient declined  Copy of [place] in Chart? No - copy requested  Would patient like information on creating a medical advance directive? No - Patient declined  Pre-existing out of facility DNR order (yellow form or pink MOST form) Yellow form placed in chart (order not valid for inpatient use);Pink MOST form placed in chart (order not valid for inpatient use)     Chief Complaint  Patient presents with  . Acute Visit    Right lower leg weakness     HPI:  Pt is a 85 y.o. female seen today for an acute visit for the patient stated she could not move her right lower leg or foot yesterday, resolved today. She denied headache, slurred speech, change of vision, chest pain/pressure, palpitation, or other limb weakness. Hx of Afib, heart rate is in control, on Metoprolol,  Cardizem, Eliquis 2.5mg  bid, Amiodarone. CHF, compensated, O2 dependent, on Torsemide 20mg  qd. Hx of OA right knee, pain with walking for years, no recent injury or swelling or redness/warmth or decreased ROM.    Past Medical History:  Diagnosis Date  .  Arthritis   . Constipation   . COPD (chronic obstructive pulmonary disease) ([place])   . Fatigue   . Frequent UTI   . GERD (gastroesophageal reflux disease)   . History of shingles 2007  . Hyperlipemia   . Hypertension   . Hypothyroidism   . Multiple allergies   . Osteoporosis   . Paget's carcinoma of the nipple ([place]) 03/16/2013  . Pneumonia   . Seasonal allergies   . Stroke [facility]) 2010   no residual  . Thyroid disease   . Uterine cancer [facility])    Past Surgical History:  Procedure Laterality Date  . ABDOMINAL HYSTERECTOMY    . BREAST LUMPECTOMY     left breast  . CAROTID ENDARTERECTOMY Left June 10, 2008   CE  . EYE SURGERY     cataracts removed bilaterally  . TONSILLECTOMY      Allergies  Allergen Reactions  . Sulfa Antibiotics Shortness Of Breath and Palpitations  . Sulfamethoxazole Shortness Of Breath and Palpitations  . Sulfur Shortness Of Breath and Palpitations    "Allergic," per MAR (although I believe they might've meant "Sulfa"??)  . Amoxicillin Rash  . Penicillin G Rash    Did it involve swelling of the face/tongue/throat, SOB, or low BP? Yes Did it involve sudden or severe rash/hives, skin peeling, or any reaction on the inside of your mouth or nose? Unk Did you need to seek medical attention at a hospital or doctor's office? Unk When did it last happen? "it was a long time ago" If all above answers are "NO", may proceed with cephalosporin use.   Marland Kitchen  Penicillins Rash    Did it involve swelling of the face/tongue/throat, SOB, or low BP? Yes Did it involve sudden or severe rash/hives, skin peeling, or any reaction on the inside of your mouth or nose? Unk Did you need to seek medical attention at a hospital or doctor's office? Unk When did it last happen? "it was a long time ago" If all above answers are "NO", may proceed with cephalosporin use.   . Tape Other (See Comments)    SKIN IS VERY FRAGILE- PLEASE USE AN ALTERNATIVE TO TAPE, AS THE SKIN TEARS EASILY!!     Allergies as of 09/12/2018      Reactions   Sulfa Antibiotics Shortness Of Breath, Palpitations   Sulfamethoxazole Shortness Of Breath, Palpitations   Sulfur Shortness Of Breath, Palpitations   "Allergic," per MAR (although I believe they might've meant "Sulfa"??)   Amoxicillin Rash   Penicillin G Rash   Did it involve swelling of the face/tongue/throat, SOB, or low BP? Yes Did it involve sudden or severe rash/hives, skin peeling, or any reaction on the inside of your mouth or nose? Unk Did you need to seek medical attention at a hospital or doctor's office? Unk When did it last happen? "it was a long time ago" If all above answers are "NO", may proceed with cephalosporin use.   Penicillins Rash   Did it involve swelling of the face/tongue/throat, SOB, or low BP? Yes Did it involve sudden or severe rash/hives, skin peeling, or any reaction on the inside of your mouth or nose? Unk Did you need to seek medical attention at a hospital or doctor's office? Unk When did it last happen? "it was a long time ago" If all above answers are "NO", may proceed with cephalosporin use.   Tape Other (See Comments)   SKIN IS VERY FRAGILE- PLEASE USE AN ALTERNATIVE TO TAPE, AS THE SKIN TEARS EASILY!!      Medication List       Accurate as of September 12, 2018 11:59 PM. If you have any questions, ask your nurse or doctor.        STOP taking these medications   metoprolol tartrate 25 MG tablet Commonly known as: LOPRESSOR Stopped by: Dezmin Kittelson X Viktoriya Glaspy, NP     TAKE these medications   amiodarone 200 MG tablet Commonly known as: PACERONE Take 200 mg by mouth daily.   apixaban 2.5 MG Tabs tablet Commonly known as: ELIQUIS Take 1 tablet (2.5 mg total) by mouth 2 (two) times daily.   Demadex 20 MG tablet Generic drug: torsemide Take 20 mg by mouth daily.   diltiazem 120 MG 24 hr capsule Commonly known as: CARDIZEM CD Take 1 capsule (120 mg total) by mouth daily.   ergocalciferol 1.25 MG (50000  UT) capsule Commonly known as: VITAMIN D2 Take 1,250 Units by mouth once a week. Once a day on Sunday   fluticasone 50 MCG/ACT nasal spray Commonly known as: FLONASE Place 1 spray into both nostrils 2 (two) times a day.   FLUTICASONE PROPIONATE (INHAL) IN Inhale 50 mcg into the lungs daily. 2 sprays each nostril once a morning   Fluticasone-Salmeterol 250-50 MCG/DOSE Aepb Commonly known as: ADVAIR Inhale 1 puff into the lungs 2 (two) times a day.   ICAPS AREDS 2 PO Take 1 tablet by mouth daily.   Centrum Silver 50+Women Tabs Take 1 tablet by mouth daily with breakfast.   ipratropium-albuterol 0.5-2.5 (3) MG/3ML Soln Commonly known as: DUONEB Take 3 mLs by nebulization every  6 (six) hours as needed (WHEEZING).   levothyroxine 100 MCG tablet Commonly known as: SYNTHROID Take 100 mcg by mouth daily before breakfast.   metoprolol succinate 25 MG 24 hr tablet Commonly known as: TOPROL-XL 12.5 mg.   OXYGEN Inhale 2 L into the lungs continuous. SAT MUST BE >90%   pantoprazole 40 MG tablet Commonly known as: PROTONIX Take 40 mg by mouth daily.   potassium chloride 20 MEQ packet Commonly known as: KLOR-CON Take 20 mEq by mouth daily.   potassium chloride SA 20 MEQ tablet Commonly known as: K-DUR   Resource Support Liqd Take 120 mLs by mouth 3 (three) times daily between meals.   sennosides-docusate sodium 8.6-50 MG tablet Commonly known as: SENOKOT-S Take 1 tablet by mouth 2 (two) times a day.   tiotropium 18 MCG inhalation capsule Commonly known as: SPIRIVA Place 18 mcg into inhaler and inhale daily.   zinc oxide 20 % ointment Apply 1 application topically as needed for irritation. Apply to buttocks after every incontinent episode and as needed for redness      ROS was provided with assistance of staff Review of Systems  Constitutional: Negative for activity change, appetite change, chills, diaphoresis, fatigue, fever and unexpected weight change.  HENT:  Positive for hearing loss. Negative for congestion and voice change.   Respiratory: Positive for shortness of breath. Negative for cough, chest tightness and wheezing.        DOE, O2 dependent  Cardiovascular: Positive for leg swelling. Negative for chest pain and palpitations.  Musculoskeletal: Positive for arthralgias and gait problem.  Skin: Negative for color change and pallor.  Neurological: Positive for weakness. Negative for dizziness, syncope, facial asymmetry, speech difficulty, light-headedness, numbness and headaches.       Memory lapses. Resolved right lower leg/foot weakness with in 24 hours.   Psychiatric/Behavioral: Negative for agitation, hallucinations and sleep disturbance. The patient is not nervous/anxious.     Immunization History  Administered Date(s) Administered  . Influenza-Unspecified 11/15/2013  . PPD Test 07/24/2011  . Zoster Recombinat (Shingrix) 12/10/2017   Pertinent  Health Maintenance Due  Topic Date Due  . PNA vac Low Risk Adult (1 of 2 - PCV13) 10/02/1984  . INFLUENZA VACCINE  09/16/2018  . DEXA SCAN  Completed   Fall Risk  01/28/2014  Falls in the past year? Yes  Number falls in past yr: 1  Injury with Fall? No   Functional Status Survey:    Vitals:   09/12/18 1610  BP: 116/62  Pulse: 92  Resp: 20  Temp: 97.6 F (36.4 C)  SpO2: 95%  Weight: 96 lb 11.2 oz (43.9 kg)  Height: 4\' 10"  (1.473 m)   Body mass index is 20.21 kg/m. Physical Exam Vitals signs and nursing note reviewed.  Constitutional:      General: She is not in acute distress.    Appearance: Normal appearance. She is not ill-appearing, toxic-appearing or diaphoretic.  HENT:     Head: Normocephalic and atraumatic.     Nose: Nose normal.     Mouth/Throat:     Mouth: Mucous membranes are moist.  Eyes:     Extraocular Movements: Extraocular movements intact.     Conjunctiva/sclera: Conjunctivae normal.     Pupils: Pupils are equal, round, and reactive to light.  Neck:      Musculoskeletal: Normal range of motion and neck supple.  Cardiovascular:     Rate and Rhythm: Normal rate. Rhythm irregular.     Heart sounds: No murmur.  Pulmonary:     Breath sounds: No wheezing, rhonchi or rales.     Comments: O2 dependent.  Chest:     Chest wall: No tenderness.  Musculoskeletal:        General: No swelling, tenderness, deformity or signs of injury.     Right lower leg: Edema present.     Left lower leg: Edema present.     Comments: Trace edema BLE.   Skin:    General: Skin is warm and dry.     Comments: Venous insufficiency skin changes BLE  Neurological:     General: No focal deficit present.     Mental Status: She is alert. Mental status is at baseline.     Cranial Nerves: No cranial nerve deficit.     Motor: No weakness.     Coordination: Coordination normal.     Gait: Gait abnormal.     Comments: Oriented to person and place.   Psychiatric:        Mood and Affect: Mood normal.        Behavior: Behavior normal.        Thought Content: Thought content normal.        Judgment: Judgment normal.     Labs reviewed: Recent Labs    03/17/18 1753  05/01/18 0545 05/02/18 0402  05/21/18 0815 05/22/18 6283 05/23/18 0326  06/08/18 06/29/18 08/18/18  NA  --    < > 134* 133*   < > 138 137 136   < > 137 135* 137  K  --    < > 3.9 4.4   < > 3.8 3.3* 3.0*   < > 4.8 4.4 4.0  CL  --    < > 95* 96*   < > 95* 93* 93*  --  98  --   --   CO2  --    < > 29 27   < > 33* 35* 33*  --  32  --   --   GLUCOSE  --    < > 140* 131*   < > 96 95 94  --   --   --   --   BUN  --    < > 34* 43*   < > 26* 26* 25*   < > 38* 45* 54*  CREATININE  --    < > 1.39* 1.50*   < > 1.48* 1.32* 1.17*   < > 1.5* 1.9* 1.8*  CALCIUM  --    < > 8.9 8.7*   < > 9.1 8.6* 8.5*  --  8.7  --   --   MG 1.7  --  2.0 1.9  --   --   --   --   --   --   --   --    < > = values in this interval not displayed.   Recent Labs    05/01/18 0545 05/02/18 0402 05/11/18 05/19/18 2000  AST 19 23 11* 19   ALT 19 24 12 19   ALKPHOS 62 56 56 79  BILITOT 0.5 0.6  --  0.7  PROT 6.3* 5.7*  --  6.3*  ALBUMIN 3.0* 2.9*  --  3.1*   Recent Labs    03/17/18 1604  05/02/18 0402 05/11/18 05/19/18 2000 05/21/18 0815 05/29/18  WBC 17.1*   < > 9.2 12.1 10.0 12.0*  --   NEUTROABS 12.8*  --   --   --  7.9* 9.1*  --  HGB 11.3*   < > 9.9* 10.8* 11.2* 10.4* 9.6*  HCT 34.5*   < > 30.2* 32* 35.8* 31.9* 29*  MCV 93.8   < > 93.8  --  94.7 94.4  --   PLT 253   < > 348 282 248 289 339   < > = values in this interval not displayed.   Lab Results  Component Value Date   TSH 2.84 07/06/2018   No results found for: HGBA1C No results found for: CHOL, HDL, LDLCALC, LDLDIRECT, TRIG, CHOLHDL  Significant Diagnostic Results in last 30 days:  No results found.  Assessment/Plan TIA (transient ischemic attack) Resolved right lower leg/foot weakness is possible TIA in setting of Hx of Afib, on Eliquis. Observe.   Atrial fibrillation (HCC) Heart rate is in control, continue Metoprolol, Cardizem, Eliquis.   Diastolic CHF ([place]) Compensated clinically, continue Torsemide  Chronic pain of right knee Not new, no recent injury, no warmth/swelling/defomity. The patient declined X-ray or Tylenol. Observe.     Family/ staff Communication: plan of care reviewed with the patient and charge nurse.   Labs/tests ordered:  None  Time spend 25 minutes.

## 2018-09-12 NOTE — Assessment & Plan Note (Signed)
Resolved right lower leg/foot weakness is possible TIA in setting of Hx of Afib, on Eliquis. Observe.

## 2018-09-12 NOTE — Assessment & Plan Note (Signed)
Not new, no recent injury, no warmth/swelling/defomity. The patient declined X-ray or Tylenol. Observe.

## 2018-09-13 ENCOUNTER — Encounter: Payer: Self-pay | Admitting: Nurse Practitioner

## 2018-09-21 ENCOUNTER — Encounter: Payer: Self-pay | Admitting: Internal Medicine

## 2018-09-21 ENCOUNTER — Non-Acute Institutional Stay (SKILLED_NURSING_FACILITY): Payer: Medicare Other | Admitting: Internal Medicine

## 2018-09-21 DIAGNOSIS — I4819 Other persistent atrial fibrillation: Secondary | ICD-10-CM | POA: Diagnosis not present

## 2018-09-21 DIAGNOSIS — K5901 Slow transit constipation: Secondary | ICD-10-CM

## 2018-09-21 DIAGNOSIS — I5032 Chronic diastolic (congestive) heart failure: Secondary | ICD-10-CM | POA: Diagnosis not present

## 2018-09-21 DIAGNOSIS — J449 Chronic obstructive pulmonary disease, unspecified: Secondary | ICD-10-CM | POA: Diagnosis not present

## 2018-09-21 NOTE — Progress Notes (Signed)
Location:  [facility] Room Number: 49 Place of Service:  SNF (31) Provider:  L,MD  [name], MD  Patient Care Team: [name], MD as PCP - General (Internal Medicine) Debara Pickett Nadean Corwin, MD as PCP - Cardiology (Cardiology) Nicholas Lose, MD as Consulting Physician (Hematology and Oncology)  Extended Emergency Contact Information Primary Emergency Contact: Little Ishikawa Mobile Phone: [phone] Relation: Niece Secondary Emergency Contact: Cromer,Chad Mobile Phone: [phone] Relation: Nephew  Code Status:DNR Goals of care: Advanced Directive information Advanced Directives 09/21/2018  Does Patient Have a Medical Advance Directive? Yes  Type of Paramedic of [place];Out of facility DNR (pink MOST or yellow form)  Does patient want to make changes to medical advance directive? No - Patient declined  Copy of [place] in Chart? Yes - validated most recent copy scanned in chart (See row information)  Would patient like information on creating a medical advance directive? No - Patient declined  Pre-existing out of facility DNR order (yellow form or pink MOST form) Yellow form placed in chart (order not valid for inpatient use);Pink MOST form placed in chart (order not valid for inpatient use)     Chief Complaint  Patient presents with  . Medical Management of Chronic Issues    Routine visit     HPI:  Pt is a 85 y.o. female seen today for medical management of chronic diseases.   She is Long term Resident of facility  Patient has PMH ofTIA in 2010 s/p CEA in 2010, COPD on oxygen at night, Hypertension,GERD,Hypothyroidism, Gastritis and Paget's disease of Nipple And Recently she has been diagnosed withNew onset Atrial Fibrillation, CHF, bilateral pleural effusions requiring thoracocentesis and right upper lobe nodule suspicious of cancer.  Patient is doing well in facility She had few  episodes in which she feels she cannot lift her Right Leg. And she says it resolves once she starts working with therapy. Denies any pain or any  Weakness in any other Extremities.  Her weight is still staying stable.  She is walking with therapy denies that her right leg lags behind.  Her cough is mostly resolved. On 4 l of Oxygen she is stable Walks with Walker and mild assist. Does not feel really dizzy anymore Past Medical History:  Diagnosis Date  . Arthritis   . Constipation   . COPD (chronic obstructive pulmonary disease) ([place])   . Fatigue   . Frequent UTI   . GERD (gastroesophageal reflux disease)   . History of shingles 2007  . Hyperlipemia   . Hypertension   . Hypothyroidism   . Multiple allergies   . Osteoporosis   . Paget's carcinoma of the nipple ([place]) 03/16/2013  . Pneumonia   . Seasonal allergies   . Stroke [facility]) 2010   no residual  . Thyroid disease   . Uterine cancer [facility])    Past Surgical History:  Procedure Laterality Date  . ABDOMINAL HYSTERECTOMY    . BREAST LUMPECTOMY     left breast  . CAROTID ENDARTERECTOMY Left June 10, 2008   CE  . EYE SURGERY     cataracts removed bilaterally  . TONSILLECTOMY      Allergies  Allergen Reactions  . Sulfa Antibiotics Shortness Of Breath and Palpitations  . Sulfamethoxazole Shortness Of Breath and Palpitations  . Sulfur Shortness Of Breath and Palpitations    "Allergic," per MAR (although I believe they might've meant "Sulfa"??)  . Amoxicillin Rash  . Penicillin  G Rash    Did it involve swelling of the face/tongue/throat, SOB, or low BP? Yes Did it involve sudden or severe rash/hives, skin peeling, or any reaction on the inside of your mouth or nose? Unk Did you need to seek medical attention at a hospital or doctor's office? Unk When did it last happen? "it was a long time ago" If all above answers are "NO", may proceed with cephalosporin use.   Marland Kitchen Penicillins Rash    Did it involve swelling of the  face/tongue/throat, SOB, or low BP? Yes Did it involve sudden or severe rash/hives, skin peeling, or any reaction on the inside of your mouth or nose? Unk Did you need to seek medical attention at a hospital or doctor's office? Unk When did it last happen? "it was a long time ago" If all above answers are "NO", may proceed with cephalosporin use.   . Tape Other (See Comments)    SKIN IS VERY FRAGILE- PLEASE USE AN ALTERNATIVE TO TAPE, AS THE SKIN TEARS EASILY!!    Outpatient Encounter Medications as of 09/21/2018  Medication Sig  . amiodarone (PACERONE) 200 MG tablet Take 200 mg by mouth daily.  Marland Kitchen apixaban (ELIQUIS) 2.5 MG TABS tablet Take 1 tablet (2.5 mg total) by mouth 2 (two) times daily.  Marland Kitchen diltiazem (CARDIZEM CD) 120 MG 24 hr capsule Take 1 capsule (120 mg total) by mouth daily.  . ergocalciferol (VITAMIN D2) 1.25 MG (50000 UT) capsule Take 1,250 Units by mouth once a week. Once a day on Sunday  . FLUTICASONE PROPIONATE, INHAL, IN Inhale 50 mcg into the lungs daily. 2 sprays each nostril once a morning  . Fluticasone-Salmeterol (ADVAIR) 250-50 MCG/DOSE AEPB Inhale 1 puff into the lungs 2 (two) times a day.   . ipratropium-albuterol (DUONEB) 0.5-2.5 (3) MG/3ML SOLN Take 3 mLs by nebulization every 6 (six) hours as needed (WHEEZING).  Marland Kitchen levothyroxine (SYNTHROID) 100 MCG tablet Take 100 mcg by mouth daily before breakfast.  . metoprolol succinate (TOPROL-XL) 25 MG 24 hr tablet 12.5 mg.   . Multiple Vitamins-Minerals (CENTRUM SILVER 50+WOMEN) TABS Take 1 tablet by mouth daily with breakfast.  . Multiple Vitamins-Minerals (ICAPS AREDS 2 PO) Take 1 tablet by mouth daily.  . Nutritional Supplements (RESOURCE SUPPORT) LIQD Take 120 mLs by mouth 3 (three) times daily between meals.   . OXYGEN Inhale 2 L into the lungs continuous. SAT MUST BE >90%  . pantoprazole (PROTONIX) 40 MG tablet Take 40 mg by mouth daily.   . potassium chloride (KLOR-CON) 20 MEQ packet Take 20 mEq by mouth daily.   .  potassium chloride SA (K-DUR) 20 MEQ tablet   . sennosides-docusate sodium (SENOKOT-S) 8.6-50 MG tablet Take 1 tablet by mouth 2 (two) times a day.  . tiotropium (SPIRIVA) 18 MCG inhalation capsule Place 18 mcg into inhaler and inhale daily.  Marland Kitchen torsemide (DEMADEX) 20 MG tablet Take 20 mg by mouth daily.  Marland Kitchen zinc oxide 20 % ointment Apply 1 application topically as needed for irritation. Apply to buttocks after every incontinent episode and as needed for redness  . [DISCONTINUED] fluticasone (FLONASE) 50 MCG/ACT nasal spray Place 1 spray into both nostrils 2 (two) times a day.    No facility-administered encounter medications on file as of 09/21/2018.     Review of Systems  Constitutional: Negative.   HENT: Negative.   Respiratory: Positive for cough and shortness of breath.   Cardiovascular: Positive for leg swelling.  Gastrointestinal: Positive for constipation.  Genitourinary: Negative.  Musculoskeletal: Positive for back pain.  Skin: Negative.   Neurological: Negative.   Psychiatric/Behavioral: Negative.     Immunization History  Administered Date(s) Administered  . Influenza-Unspecified 11/15/2013  . PPD Test 07/24/2011  . Zoster Recombinat (Shingrix) 12/10/2017   Pertinent  Health Maintenance Due  Topic Date Due  . PNA vac Low Risk Adult (1 of 2 - PCV13) 10/02/1984  . INFLUENZA VACCINE  09/16/2018  . DEXA SCAN  Completed   Fall Risk  01/28/2014  Falls in the past year? Yes  Number falls in past yr: 1  Injury with Fall? No   Functional Status Survey:    Vitals:   09/21/18 1135  BP: 98/60  Pulse: (!) 102  Resp: 20  Temp: (!) 96.6 F (35.9 C)  SpO2: 96%  Weight: 96 lb 14.4 oz (44 kg)  Height: 4\' 10"  (1.473 m)   Body mass index is 20.25 kg/m. Physical Exam Vitals signs reviewed.  HENT:     Head: Normocephalic.     Nose: Nose normal.     Mouth/Throat:     Mouth: Mucous membranes are moist.     Pharynx: Oropharynx is clear.  Eyes:     Pupils: Pupils are  equal, round, and reactive to light.  Neck:     Musculoskeletal: Neck supple.  Cardiovascular:     Rate and Rhythm: Tachycardia present. Rhythm irregular.     Pulses: Normal pulses.     Heart sounds: Normal heart sounds.  Pulmonary:     Effort: Pulmonary effort is normal. No respiratory distress.     Breath sounds: Normal breath sounds. No wheezing.  Abdominal:     General: Abdomen is flat. Bowel sounds are normal.     Palpations: Abdomen is soft.  Musculoskeletal:        General: No swelling.  Skin:    General: Skin is warm and dry.  Neurological:     General: No focal deficit present.     Mental Status: She is alert and oriented to person, place, and time.     Comments: She has mild Foot drop in right Foot.  Psychiatric:        Mood and Affect: Mood normal.        Thought Content: Thought content normal.        Judgment: Judgment normal.     Labs reviewed: Recent Labs    03/17/18 1753  05/01/18 0545 05/02/18 0402  05/21/18 0815 05/22/18 1660 05/23/18 0326  06/08/18 06/29/18 08/18/18  NA  --    < > 134* 133*   < > 138 137 136   < > 137 135* 137  K  --    < > 3.9 4.4   < > 3.8 3.3* 3.0*   < > 4.8 4.4 4.0  CL  --    < > 95* 96*   < > 95* 93* 93*  --  98  --   --   CO2  --    < > 29 27   < > 33* 35* 33*  --  32  --   --   GLUCOSE  --    < > 140* 131*   < > 96 95 94  --   --   --   --   BUN  --    < > 34* 43*   < > 26* 26* 25*   < > 38* 45* 54*  CREATININE  --    < > 1.39* 1.50*   < >  1.48* 1.32* 1.17*   < > 1.5* 1.9* 1.8*  CALCIUM  --    < > 8.9 8.7*   < > 9.1 8.6* 8.5*  --  8.7  --   --   MG 1.7  --  2.0 1.9  --   --   --   --   --   --   --   --    < > = values in this interval not displayed.   Recent Labs    05/01/18 0545 05/02/18 0402 05/11/18 05/19/18 2000  AST 19 23 11* 19  ALT 19 24 12 19   ALKPHOS 62 56 56 79  BILITOT 0.5 0.6  --  0.7  PROT 6.3* 5.7*  --  6.3*  ALBUMIN 3.0* 2.9*  --  3.1*   Recent Labs    03/17/18 1604  05/02/18 0402 05/11/18  05/19/18 2000 05/21/18 0815 05/29/18  WBC 17.1*   < > 9.2 12.1 10.0 12.0*  --   NEUTROABS 12.8*  --   --   --  7.9* 9.1*  --   HGB 11.3*   < > 9.9* 10.8* 11.2* 10.4* 9.6*  HCT 34.5*   < > 30.2* 32* 35.8* 31.9* 29*  MCV 93.8   < > 93.8  --  94.7 94.4  --   PLT 253   < > 348 282 248 289 339   < > = values in this interval not displayed.   Lab Results  Component Value Date   TSH 2.84 07/06/2018   No results found for: HGBA1C No results found for: CHOL, HDL, LDLCALC, LDLDIRECT, TRIG, CHOLHDL  Significant Diagnostic Results in last 30 days:  No results found.  Assessment/Plan Chronic diastolic congestive heart failure ([place])- Plan:  Weight is stable BUN and creatinine are also stable We will continue the same dose of Demadex  Atrial fibrillation- Plan:  On Amiodarone, Cardizem and Lopressor Still stays Tachycardic but Below 100 On Low dose of Eliquis Hold her Lopressor if systolic blood pressure less than 90 Discussed with patient about seeing cardiologist but she says she is not interested in cardioversion at this time Right LE weakness ? Foot Drop My Exam patient has Mild Foot drop At this time she does not need Brace Will continue to monitor  Pleural effusion due to CHF S/PThoracocentesis in hospital  Repeat Chest [place] facility shows pleural effusion is very mild.  She continues to have bilateral infiltrates Continue Diuresis Possible suspicious Upper right Lobe nodule ? Bronchogenic carcinoma Patient is aware and does not want any treatment right now I also discussed with her today she wants to repeat the CT scan and she refused Chronic obstructive pulmonary disease, On 4 l of Oxygen On Spiriva and Advair Hypothyroidism - Plan: TSH normal on this dose  CKD (chronic kidney disease) stage 3, GFR 30-59 ml/min (HCC) - Plan: BUN and creat is stable Anemia Hgb Is low but stable Constipation Will start her on Linzess 194mcg QOD   Family/ staff  Communication:   Labs/tests ordered:  BMP and CBC in 2 weeks Total time spent in this patient care encounter was  25_  minutes; greater than 50% of the visit spent counseling patient and staff, reviewing records , Labs and coordinating care for problems addressed at this encounter.

## 2018-09-29 ENCOUNTER — Encounter: Payer: Self-pay | Admitting: Internal Medicine

## 2018-10-05 DIAGNOSIS — R6889 Other general symptoms and signs: Secondary | ICD-10-CM | POA: Diagnosis not present

## 2018-10-05 DIAGNOSIS — R7989 Other specified abnormal findings of blood chemistry: Secondary | ICD-10-CM | POA: Diagnosis not present

## 2018-10-05 LAB — CBC AND DIFFERENTIAL
HCT: 28 — AB (ref 36–46)
Hemoglobin: 9.1 — AB (ref 12.0–16.0)
Neutrophils Absolute: 4771
Platelets: 294 (ref 150–399)
WBC: 7.1

## 2018-10-05 LAB — BASIC METABOLIC PANEL
BUN: 51 — AB (ref 4–21)
Creatinine: 1.6 — AB (ref 0.5–1.1)
Glucose: 87
Potassium: 4.4 (ref 3.4–5.3)
Sodium: 137 (ref 137–147)

## 2018-10-17 ENCOUNTER — Encounter: Payer: Self-pay | Admitting: Nurse Practitioner

## 2018-10-17 ENCOUNTER — Non-Acute Institutional Stay (SKILLED_NURSING_FACILITY): Payer: Medicare Other | Admitting: Nurse Practitioner

## 2018-10-17 DIAGNOSIS — K219 Gastro-esophageal reflux disease without esophagitis: Secondary | ICD-10-CM

## 2018-10-17 DIAGNOSIS — E032 Hypothyroidism due to medicaments and other exogenous substances: Secondary | ICD-10-CM

## 2018-10-17 DIAGNOSIS — K5901 Slow transit constipation: Secondary | ICD-10-CM | POA: Diagnosis not present

## 2018-10-17 DIAGNOSIS — I5032 Chronic diastolic (congestive) heart failure: Secondary | ICD-10-CM | POA: Diagnosis not present

## 2018-10-17 DIAGNOSIS — I4819 Other persistent atrial fibrillation: Secondary | ICD-10-CM

## 2018-10-17 DIAGNOSIS — J449 Chronic obstructive pulmonary disease, unspecified: Secondary | ICD-10-CM | POA: Diagnosis not present

## 2018-10-17 NOTE — Assessment & Plan Note (Signed)
chronic SOB, edema BLE, O2 dependent, continue Torsemide 20mg  qd.

## 2018-10-17 NOTE — Assessment & Plan Note (Signed)
heart rate is in control, continue Metoprolol 12.5mg  qd, Diltiazem 120mg  qd, Amiodarone 200mg  qd, Eliquis 2.5mg  bid.

## 2018-10-17 NOTE — Assessment & Plan Note (Addendum)
Worsened cough, but denied chest pain or palpitation, her DOE is at her baseline, continue  Spiriva 42mcg qd, Advair bid. Obtain CXR to r/u acute cardiopulmonary process.

## 2018-10-17 NOTE — Progress Notes (Signed)
Location:  [facility] Room Number: 52 Place of Service:  SNF (31) Provider:  Marlana Latus  NP  [name], MD  Patient Care Team: [name], MD as PCP - General (Internal Medicine) Pixie Casino, MD as PCP - Cardiology (Cardiology) Nicholas Lose, MD as Consulting Physician (Hematology and Oncology)  Extended Emergency Contact Information Primary Emergency Contact: Little Ishikawa Mobile Phone: [phone] Relation: Niece Secondary Emergency Contact: Cromer,Chad Mobile Phone: [phone] Relation: Nephew  Code Status:  DNR Goals of care: Advanced Directive information Advanced Directives 10/17/2018  Does Patient Have a Medical Advance Directive? Yes  Type of Paramedic of [place];Living will;Out of facility DNR (pink MOST or yellow form)  Does patient want to make changes to medical advance directive? No - Patient declined  Copy of [place] in Chart? Yes - validated most recent copy scanned in chart (See row information)  Would patient like information on creating a medical advance directive? No - Patient declined  Pre-existing out of facility DNR order (yellow form or pink MOST form) Yellow form placed in chart (order not valid for inpatient use)     Chief Complaint  Patient presents with  . Medical Management of Chronic Issues    HPI:  Pt is a 85 y.o. female seen today for medical management of chronic diseases.    The patient resides in [facility] for safety and care assistance, Hx of CHF, chronic SOB, edema BLE, O2 dependent, on Torsemide 20mg  qd. COPD, on Spiriva 25mcg qd, Advair bid.  Constipation, stable on SEnokot S II bid, Linzess 137mcg qod, GERD, stable, on Pantoprazole 40mg  qd. Afib, heart rate is in control, on Metoprolol 12.5mg  qd, Diltiazem 120mg  qd, Amiodarone 200mg  qd, Eliquis 2.5mg  bid.  Hypothyroidism, on Levothyroxine 132mcg qd, last TSH 2.84 07/06/18   Past Medical History:   Diagnosis Date  . Arthritis   . Constipation   . COPD (chronic obstructive pulmonary disease) ([place])   . Fatigue   . Frequent UTI   . GERD (gastroesophageal reflux disease)   . History of shingles 2007  . Hyperlipemia   . Hypertension   . Hypothyroidism   . Multiple allergies   . Osteoporosis   . Paget's carcinoma of the nipple ([place]) 03/16/2013  . Pneumonia   . Seasonal allergies   . Stroke [facility]) 2010   no residual  . Thyroid disease   . Uterine cancer [facility])    Past Surgical History:  Procedure Laterality Date  . ABDOMINAL HYSTERECTOMY    . BREAST LUMPECTOMY     left breast  . CAROTID ENDARTERECTOMY Left June 10, 2008   CE  . EYE SURGERY     cataracts removed bilaterally  . TONSILLECTOMY      Allergies  Allergen Reactions  . Sulfa Antibiotics Shortness Of Breath and Palpitations  . Sulfamethoxazole Shortness Of Breath and Palpitations  . Sulfur Shortness Of Breath and Palpitations    "Allergic," per MAR (although I believe they might've meant "Sulfa"??)  . Amoxicillin Rash  . Penicillin G Rash    Did it involve swelling of the face/tongue/throat, SOB, or low BP? Yes Did it involve sudden or severe rash/hives, skin peeling, or any reaction on the inside of your mouth or nose? Unk Did you need to seek medical attention at a hospital or doctor's office? Unk When did it last happen? "it was a long time ago" If all above answers are "NO", may proceed with cephalosporin use.   Marland Kitchen  Penicillins Rash    Did it involve swelling of the face/tongue/throat, SOB, or low BP? Yes Did it involve sudden or severe rash/hives, skin peeling, or any reaction on the inside of your mouth or nose? Unk Did you need to seek medical attention at a hospital or doctor's office? Unk When did it last happen? "it was a long time ago" If all above answers are "NO", may proceed with cephalosporin use.   . Tape Other (See Comments)    SKIN IS VERY FRAGILE- PLEASE USE AN ALTERNATIVE TO TAPE, AS THE  SKIN TEARS EASILY!!    Outpatient Encounter Medications as of 10/17/2018  Medication Sig  . Amino Acids-Protein Hydrolys (FEEDING SUPPLEMENT, PRO-STAT SUGAR FREE 64,) LIQD Take 30 mLs by mouth. Twice daily with meals  . amiodarone (PACERONE) 200 MG tablet Take 200 mg by mouth daily.  Marland Kitchen apixaban (ELIQUIS) 2.5 MG TABS tablet Take 1 tablet (2.5 mg total) by mouth 2 (two) times daily.  Marland Kitchen diltiazem (CARDIZEM CD) 120 MG 24 hr capsule Take 1 capsule (120 mg total) by mouth daily.  . ergocalciferol (VITAMIN D2) 1.25 MG (50000 UT) capsule Take 1,250 Units by mouth once a week. Once a day on Sunday  . fluticasone (FLONASE) 50 MCG/ACT nasal spray Place 2 sprays into both nostrils every morning.  . Fluticasone-Salmeterol (ADVAIR) 250-50 MCG/DOSE AEPB Inhale 1 puff into the lungs 2 (two) times a day.   . ipratropium-albuterol (DUONEB) 0.5-2.5 (3) MG/3ML SOLN Take 3 mLs by nebulization every 6 (six) hours as needed (WHEEZING).  Marland Kitchen levothyroxine (SYNTHROID) 100 MCG tablet Take 100 mcg by mouth daily before breakfast.  . linaclotide (LINZESS) 145 MCG CAPS capsule Take 145 mcg by mouth every other day.  . metoprolol succinate (TOPROL-XL) 25 MG 24 hr tablet 12.5 mg.   . Nutritional Supplements (RESOURCE SUPPORT) LIQD Take 120 mLs by mouth 3 (three) times daily between meals.   . OXYGEN Inhale 2-4 L into the lungs continuous. SAT MUST BE >90%  . pantoprazole (PROTONIX) 40 MG tablet Take 40 mg by mouth daily.   . potassium chloride (KLOR-CON) 20 MEQ packet Take 20 mEq by mouth daily.   . sennosides-docusate sodium (SENOKOT-S) 8.6-50 MG tablet Take 2 tablets by mouth 2 (two) times a day.   . tiotropium (SPIRIVA) 18 MCG inhalation capsule Place 18 mcg into inhaler and inhale daily.  Marland Kitchen torsemide (DEMADEX) 20 MG tablet Take 20 mg by mouth daily.  Marland Kitchen zinc oxide 20 % ointment Apply 1 application topically as needed for irritation. Apply to buttocks after every incontinent episode and as needed for redness  .  [DISCONTINUED] FLUTICASONE PROPIONATE, INHAL, IN Inhale 50 mcg into the lungs. 1 puff to each nostril  . [DISCONTINUED] Multiple Vitamins-Minerals (CENTRUM SILVER 50+WOMEN) TABS Take 1 tablet by mouth daily with breakfast.  . [DISCONTINUED] Multiple Vitamins-Minerals (ICAPS AREDS 2 PO) Take 1 tablet by mouth daily.  . [DISCONTINUED] potassium chloride SA (K-DUR) 20 MEQ tablet    No facility-administered encounter medications on file as of 10/17/2018.    ROS was provided with assistance of staff.  Review of Systems  Constitutional: Negative for activity change, appetite change, chills, diaphoresis, fatigue, fever and unexpected weight change.  HENT: Positive for hearing loss. Negative for congestion and voice change.   Respiratory: Positive for cough and shortness of breath. Negative for wheezing.   Cardiovascular: Positive for leg swelling. Negative for chest pain and palpitations.  Gastrointestinal: Negative for abdominal distention, abdominal pain, constipation, diarrhea, nausea and vomiting.  Genitourinary:  Negative for difficulty urinating, dysuria and urgency.  Musculoskeletal: Positive for back pain and gait problem.  Skin: Negative for color change and pallor.  Neurological: Negative for dizziness, speech difficulty, weakness and headaches.       Memory lapses.   Psychiatric/Behavioral: Negative for agitation, behavioral problems, hallucinations and sleep disturbance. The patient is not nervous/anxious.     Immunization History  Administered Date(s) Administered  . Influenza-Unspecified 11/15/2013  . PPD Test 07/24/2011  . Zoster Recombinat (Shingrix) 12/10/2017   Pertinent  Health Maintenance Due  Topic Date Due  . PNA vac Low Risk Adult (1 of 2 - PCV13) 10/02/1984  . INFLUENZA VACCINE  09/16/2018  . DEXA SCAN  Completed   Fall Risk  01/28/2014  Falls in the past year? Yes  Number falls in past yr: 1  Injury with Fall? No   Functional Status Survey:    Vitals:    10/17/18 1258  BP: (!) 98/50  Pulse: 90  Resp: 20  Temp: 98.2 F (36.8 C)  SpO2: 96%  Weight: 93 lb 1.6 oz (42.2 kg)  Height: 4\' 10"  (1.473 m)   Body mass index is 19.46 kg/m. Physical Exam Vitals signs and nursing note reviewed.  Constitutional:      General: She is not in acute distress.    Appearance: Normal appearance. She is normal weight. She is not ill-appearing, toxic-appearing or diaphoretic.  HENT:     Mouth/Throat:     Mouth: Mucous membranes are moist.  Eyes:     Extraocular Movements: Extraocular movements intact.     Conjunctiva/sclera: Conjunctivae normal.     Pupils: Pupils are equal, round, and reactive to light.  Neck:     Musculoskeletal: Normal range of motion. No muscular tenderness.  Cardiovascular:     Rate and Rhythm: Normal rate. Rhythm irregular.     Heart sounds: No murmur.  Pulmonary:     Breath sounds: Rales present. No wheezing or rhonchi.  Abdominal:     General: Bowel sounds are normal.     Palpations: Abdomen is soft.     Tenderness: There is no abdominal tenderness. There is no right CVA tenderness, left CVA tenderness, guarding or rebound.  Musculoskeletal:     Right lower leg: Edema present.     Left lower leg: Edema present.     Comments: Trace edema, w/c for mobility, R foot drop.   Skin:    General: Skin is warm and dry.  Neurological:     General: No focal deficit present.     Mental Status: She is alert. Mental status is at baseline.     Cranial Nerves: No cranial nerve deficit.     Motor: No weakness.     Coordination: Coordination normal.     Gait: Gait abnormal.     Comments: Oriented to person, place.   Psychiatric:        Mood and Affect: Mood normal.        Behavior: Behavior normal.        Thought Content: Thought content normal.     Labs reviewed: Recent Labs    03/17/18 1753  05/01/18 0545 05/02/18 0402  05/21/18 0815 05/22/18 8185 05/23/18 0326  06/08/18 06/29/18 08/18/18  NA  --    < > 134* 133*   < >  138 137 136   < > 137 135* 137  K  --    < > 3.9 4.4   < > 3.8 3.3* 3.0*   < >  4.8 4.4 4.0  CL  --    < > 95* 96*   < > 95* 93* 93*  --  98  --   --   CO2  --    < > 29 27   < > 33* 35* 33*  --  32  --   --   GLUCOSE  --    < > 140* 131*   < > 96 95 94  --   --   --   --   BUN  --    < > 34* 43*   < > 26* 26* 25*   < > 38* 45* 54*  CREATININE  --    < > 1.39* 1.50*   < > 1.48* 1.32* 1.17*   < > 1.5* 1.9* 1.8*  CALCIUM  --    < > 8.9 8.7*   < > 9.1 8.6* 8.5*  --  8.7  --   --   MG 1.7  --  2.0 1.9  --   --   --   --   --   --   --   --    < > = values in this interval not displayed.   Recent Labs    05/01/18 0545 05/02/18 0402 05/11/18 05/19/18 2000  AST 19 23 11* 19  ALT 19 24 12 19   ALKPHOS 62 56 56 79  BILITOT 0.5 0.6  --  0.7  PROT 6.3* 5.7*  --  6.3*  ALBUMIN 3.0* 2.9*  --  3.1*   Recent Labs    03/17/18 1604  05/02/18 0402 05/11/18 05/19/18 2000 05/21/18 0815 05/29/18  WBC 17.1*   < > 9.2 12.1 10.0 12.0*  --   NEUTROABS 12.8*  --   --   --  7.9* 9.1*  --   HGB 11.3*   < > 9.9* 10.8* 11.2* 10.4* 9.6*  HCT 34.5*   < > 30.2* 32* 35.8* 31.9* 29*  MCV 93.8   < > 93.8  --  94.7 94.4  --   PLT 253   < > 348 282 248 289 339   < > = values in this interval not displayed.   Lab Results  Component Value Date   TSH 2.84 07/06/2018   No results found for: HGBA1C No results found for: CHOL, HDL, LDLCALC, LDLDIRECT, TRIG, CHOLHDL  Significant Diagnostic Results in last 30 days:  No results found.  Assessment/Plan Diastolic CHF (HCC) chronic SOB, edema BLE, O2 dependent, continue Torsemide 20mg  qd.   Atrial fibrillation (HCC) heart rate is in control, continue Metoprolol 12.5mg  qd, Diltiazem 120mg  qd, Amiodarone 200mg  qd, Eliquis 2.5mg  bid.    COPD (chronic obstructive pulmonary disease) (HCC) Worsened cough, but denied chest pain or palpitation, her DOE is at her baseline, continue  Spiriva 17mcg qd, Advair bid. Obtain CXR to r/u acute cardiopulmonary process.  Slow  transit constipation  Stable, continue  Senokot S II bid, Linzess 159mcg qod,   GERD (gastroesophageal reflux disease) Stable, continue Pantoprazole 40mg  qd .  Hypothyroidism Stable, last TSH 2.84 07/06/18, continue Levothyroxine 185mcg qd.      Family/ staff Communication: plan of care reviewed with the patient and charge nurse.   Labs/tests ordered:  CXR  Time spend 25 minutes.

## 2018-10-17 NOTE — Assessment & Plan Note (Signed)
Stable, continue Pantoprazole 40mg qd.  

## 2018-10-17 NOTE — Assessment & Plan Note (Signed)
Stable, continue  Senokot S II bid, Linzess 188mcg qod,

## 2018-10-17 NOTE — Assessment & Plan Note (Signed)
Stable, last TSH 2.84 07/06/18, continue Levothyroxine 124mcg qd.

## 2018-10-18 DIAGNOSIS — R05 Cough: Secondary | ICD-10-CM | POA: Diagnosis not present

## 2018-10-20 ENCOUNTER — Encounter: Payer: Self-pay | Admitting: Nurse Practitioner

## 2018-10-20 ENCOUNTER — Non-Acute Institutional Stay (SKILLED_NURSING_FACILITY): Payer: Medicare Other | Admitting: Nurse Practitioner

## 2018-10-20 DIAGNOSIS — J189 Pneumonia, unspecified organism: Secondary | ICD-10-CM

## 2018-10-20 DIAGNOSIS — I5032 Chronic diastolic (congestive) heart failure: Secondary | ICD-10-CM

## 2018-10-20 DIAGNOSIS — J181 Lobar pneumonia, unspecified organism: Secondary | ICD-10-CM

## 2018-10-20 DIAGNOSIS — I4819 Other persistent atrial fibrillation: Secondary | ICD-10-CM

## 2018-10-20 DIAGNOSIS — J449 Chronic obstructive pulmonary disease, unspecified: Secondary | ICD-10-CM | POA: Diagnosis not present

## 2018-10-20 NOTE — Progress Notes (Addendum)
Location:   SNF [place] Room Number: N39 Place of Service:  SNF (31) Provider: Lennie Odor Jemila Camille NP  [name], MD  Patient Care Team: [name], MD as PCP - General (Internal Medicine) Debara Pickett Nadean Corwin, MD as PCP - Cardiology (Cardiology) Nicholas Lose, MD as Consulting Physician (Hematology and Oncology)  Extended Emergency Contact Information Primary Emergency Contact: Little Ishikawa Mobile Phone: [phone] Relation: Niece Secondary Emergency Contact: Cromer,Chad Mobile Phone: [phone] Relation: Nephew  Code Status:  DNR Goals of care: Advanced Directive information Advanced Directives 10/17/2018  Does Patient Have a Medical Advance Directive? Yes  Type of Paramedic of [place];Living will;Out of facility DNR (pink MOST or yellow form)  Does patient want to make changes to medical advance directive? No - Patient declined  Copy of [place] in Chart? Yes - validated most recent copy scanned in chart (See row information)  Would patient like information on creating a medical advance directive? No - Patient declined  Pre-existing out of facility DNR order (yellow form or pink MOST form) Yellow form placed in chart (order not valid for inpatient use)     Chief Complaint  Patient presents with  . Acute Visit    cough    HPI:  Pt is a 85 y.o. female seen today for an acute visit for worsened cough, 10/18/18 CXR showed patchy right mid and lower lung opacities with small right pleurla effusion, mild stable cardiomegaly w/o overt congestive heart failure. Hx of CHF, stable, on Torsemide 20mg  qd. COPD, on Spiriva 61mcg qd, DuoNeb q6h prn, Advair bid. She denied chest pain/pressures, palpitation, or increased SOB. Afib, heart rate is in control, on Amiodarone 200mg  qd, Eliquis 2.5mg  bid, Diltiazem 120mg  qd, Metoprolol 12.5mg  qd.    Past Medical History:  Diagnosis Date  . Arthritis   . Constipation   . COPD (chronic  obstructive pulmonary disease) ([place])   . Fatigue   . Frequent UTI   . GERD (gastroesophageal reflux disease)   . History of shingles 2007  . Hyperlipemia   . Hypertension   . Hypothyroidism   . Multiple allergies   . Osteoporosis   . Paget's carcinoma of the nipple ([place]) 03/16/2013  . Pneumonia   . Seasonal allergies   . Stroke [facility]) 2010   no residual  . Thyroid disease   . Uterine cancer [facility])    Past Surgical History:  Procedure Laterality Date  . ABDOMINAL HYSTERECTOMY    . BREAST LUMPECTOMY     left breast  . CAROTID ENDARTERECTOMY Left June 10, 2008   CE  . EYE SURGERY     cataracts removed bilaterally  . TONSILLECTOMY      Allergies  Allergen Reactions  . Sulfa Antibiotics Shortness Of Breath and Palpitations  . Sulfamethoxazole Shortness Of Breath and Palpitations  . Sulfur Shortness Of Breath and Palpitations    "Allergic," per MAR (although I believe they might've meant "Sulfa"??)  . Amoxicillin Rash  . Penicillin G Rash    Did it involve swelling of the face/tongue/throat, SOB, or low BP? Yes Did it involve sudden or severe rash/hives, skin peeling, or any reaction on the inside of your mouth or nose? Unk Did you need to seek medical attention at a hospital or doctor's office? Unk When did it last happen? "it was a long time ago" If all above answers are "NO", may proceed with cephalosporin use.   Marland Kitchen Penicillins Rash    Did it involve  swelling of the face/tongue/throat, SOB, or low BP? Yes Did it involve sudden or severe rash/hives, skin peeling, or any reaction on the inside of your mouth or nose? Unk Did you need to seek medical attention at a hospital or doctor's office? Unk When did it last happen? "it was a long time ago" If all above answers are "NO", may proceed with cephalosporin use.   . Tape Other (See Comments)    SKIN IS VERY FRAGILE- PLEASE USE AN ALTERNATIVE TO TAPE, AS THE SKIN TEARS EASILY!!    Allergies as of 10/20/2018      Reactions    Sulfa Antibiotics Shortness Of Breath, Palpitations   Sulfamethoxazole Shortness Of Breath, Palpitations   Sulfur Shortness Of Breath, Palpitations   "Allergic," per MAR (although I believe they might've meant "Sulfa"??)   Amoxicillin Rash   Penicillin G Rash   Did it involve swelling of the face/tongue/throat, SOB, or low BP? Yes Did it involve sudden or severe rash/hives, skin peeling, or any reaction on the inside of your mouth or nose? Unk Did you need to seek medical attention at a hospital or doctor's office? Unk When did it last happen? "it was a long time ago" If all above answers are "NO", may proceed with cephalosporin use.   Penicillins Rash   Did it involve swelling of the face/tongue/throat, SOB, or low BP? Yes Did it involve sudden or severe rash/hives, skin peeling, or any reaction on the inside of your mouth or nose? Unk Did you need to seek medical attention at a hospital or doctor's office? Unk When did it last happen? "it was a long time ago" If all above answers are "NO", may proceed with cephalosporin use.   Tape Other (See Comments)   SKIN IS VERY FRAGILE- PLEASE USE AN ALTERNATIVE TO TAPE, AS THE SKIN TEARS EASILY!!      Medication List       Accurate as of October 20, 2018  2:00 PM. If you have any questions, ask your nurse or doctor.        amiodarone 200 MG tablet Commonly known as: PACERONE Take 200 mg by mouth daily.   apixaban 2.5 MG Tabs tablet Commonly known as: ELIQUIS Take 1 tablet (2.5 mg total) by mouth 2 (two) times daily.   Demadex 20 MG tablet Generic drug: torsemide Take 20 mg by mouth daily.   diltiazem 120 MG 24 hr capsule Commonly known as: CARDIZEM CD Take 1 capsule (120 mg total) by mouth daily.   ergocalciferol 1.25 MG (50000 UT) capsule Commonly known as: VITAMIN D2 Take 1,250 Units by mouth once a week. Once a day on Sunday   feeding supplement (PRO-STAT SUGAR FREE 64) Liqd Take 30 mLs by mouth. Twice daily with  meals   fluticasone 50 MCG/ACT nasal spray Commonly known as: FLONASE Place 2 sprays into both nostrils every morning.   Fluticasone-Salmeterol 250-50 MCG/DOSE Aepb Commonly known as: ADVAIR Inhale 1 puff into the lungs 2 (two) times a day.   ipratropium-albuterol 0.5-2.5 (3) MG/3ML Soln Commonly known as: DUONEB Take 3 mLs by nebulization every 6 (six) hours as needed (WHEEZING).   levothyroxine 100 MCG tablet Commonly known as: SYNTHROID Take 100 mcg by mouth daily before breakfast.   linaclotide 145 MCG Caps capsule Commonly known as: LINZESS Take 145 mcg by mouth every other day.   metoprolol succinate 25 MG 24 hr tablet Commonly known as: TOPROL-XL 12.5 mg.   OXYGEN Inhale 2-4 L into the lungs continuous.  SAT MUST BE >90%   pantoprazole 40 MG tablet Commonly known as: PROTONIX Take 40 mg by mouth daily.   potassium chloride 20 MEQ packet Commonly known as: KLOR-CON Take 20 mEq by mouth daily.   Resource Support Liqd Take 120 mLs by mouth 3 (three) times daily between meals.   sennosides-docusate sodium 8.6-50 MG tablet Commonly known as: SENOKOT-S Take 2 tablets by mouth 2 (two) times a day.   tiotropium 18 MCG inhalation capsule Commonly known as: SPIRIVA Place 18 mcg into inhaler and inhale daily.   zinc oxide 20 % ointment Apply 1 application topically as needed for irritation. Apply to buttocks after every incontinent episode and as needed for redness      ROS was provided with assistance of staff.  Review of Systems  Constitutional: Positive for fatigue. Negative for activity change, appetite change, chills, diaphoresis and fever.  HENT: Positive for hearing loss. Negative for congestion and voice change.   Respiratory: Positive for cough and shortness of breath. Negative for wheezing.   Cardiovascular: Positive for leg swelling. Negative for chest pain and palpitations.  Gastrointestinal: Negative for abdominal distention, abdominal pain,  constipation, diarrhea, nausea and vomiting.  Genitourinary: Negative for difficulty urinating, dysuria and urgency.  Musculoskeletal: Positive for back pain and gait problem.  Skin: Negative for color change and pallor.  Neurological: Negative for dizziness, speech difficulty, weakness and headaches.       Memory lapses.   Psychiatric/Behavioral: Negative for agitation, behavioral problems, hallucinations and sleep disturbance. The patient is not nervous/anxious.     Immunization History  Administered Date(s) Administered  . Influenza-Unspecified 11/15/2013  . PPD Test 07/24/2011  . Zoster Recombinat (Shingrix) 12/10/2017   Pertinent  Health Maintenance Due  Topic Date Due  . PNA vac Low Risk Adult (1 of 2 - PCV13) 10/02/1984  . INFLUENZA VACCINE  09/16/2018  . DEXA SCAN  Completed   Fall Risk  01/28/2014  Falls in the past year? Yes  Number falls in past yr: 1  Injury with Fall? No   Functional Status Survey:    Vitals:   10/20/18 1136  BP: (!) 98/50  Pulse: 84  Resp: 20  Temp: (!) 97.1 F (36.2 C)  SpO2: 97%   There is no height or weight on file to calculate BMI. Physical Exam Constitutional:      General: She is not in acute distress.    Appearance: Normal appearance. She is ill-appearing. She is not toxic-appearing or diaphoretic.  HENT:     Head: Normocephalic and atraumatic.     Nose: Nose normal.     Mouth/Throat:     Mouth: Mucous membranes are moist.  Eyes:     Extraocular Movements: Extraocular movements intact.     Conjunctiva/sclera: Conjunctivae normal.     Pupils: Pupils are equal, round, and reactive to light.  Neck:     Musculoskeletal: Normal range of motion and neck supple.  Cardiovascular:     Rate and Rhythm: Normal rate. Rhythm irregular.     Heart sounds: No murmur.  Pulmonary:     Breath sounds: Rhonchi and rales present. No wheezing.  Chest:     Chest wall: No tenderness.  Abdominal:     General: Bowel sounds are normal.      Palpations: Abdomen is soft.     Tenderness: There is no abdominal tenderness. There is no right CVA tenderness, left CVA tenderness, guarding or rebound.  Musculoskeletal:     Right lower leg: Edema present.  Left lower leg: Edema present.     Comments: Trace edema BLE. W/c for mobility. Slightly right foot drop  Skin:    General: Skin is warm and dry.  Neurological:     General: No focal deficit present.     Mental Status: She is alert. Mental status is at baseline.     Cranial Nerves: No cranial nerve deficit.     Motor: No weakness.     Coordination: Coordination normal.     Gait: Gait abnormal.     Comments: Oriented to person, place.   Psychiatric:        Mood and Affect: Mood normal.        Behavior: Behavior normal.        Thought Content: Thought content normal.        Judgment: Judgment normal.     Labs reviewed: Recent Labs    03/17/18 1753  05/01/18 0545 05/02/18 0402  05/21/18 0815 05/22/18 2841 05/23/18 0326  06/08/18 06/29/18 08/18/18  NA  --    < > 134* 133*   < > 138 137 136   < > 137 135* 137  K  --    < > 3.9 4.4   < > 3.8 3.3* 3.0*   < > 4.8 4.4 4.0  CL  --    < > 95* 96*   < > 95* 93* 93*  --  98  --   --   CO2  --    < > 29 27   < > 33* 35* 33*  --  32  --   --   GLUCOSE  --    < > 140* 131*   < > 96 95 94  --   --   --   --   BUN  --    < > 34* 43*   < > 26* 26* 25*   < > 38* 45* 54*  CREATININE  --    < > 1.39* 1.50*   < > 1.48* 1.32* 1.17*   < > 1.5* 1.9* 1.8*  CALCIUM  --    < > 8.9 8.7*   < > 9.1 8.6* 8.5*  --  8.7  --   --   MG 1.7  --  2.0 1.9  --   --   --   --   --   --   --   --    < > = values in this interval not displayed.   Recent Labs    05/01/18 0545 05/02/18 0402 05/11/18 05/19/18 2000  AST 19 23 11* 19  ALT 19 24 12 19   ALKPHOS 62 56 56 79  BILITOT 0.5 0.6  --  0.7  PROT 6.3* 5.7*  --  6.3*  ALBUMIN 3.0* 2.9*  --  3.1*   Recent Labs    03/17/18 1604  05/02/18 0402 05/11/18 05/19/18 2000 05/21/18 0815 05/29/18   WBC 17.1*   < > 9.2 12.1 10.0 12.0*  --   NEUTROABS 12.8*  --   --   --  7.9* 9.1*  --   HGB 11.3*   < > 9.9* 10.8* 11.2* 10.4* 9.6*  HCT 34.5*   < > 30.2* 32* 35.8* 31.9* 29*  MCV 93.8   < > 93.8  --  94.7 94.4  --   PLT 253   < > 348 282 248 289 339   < > = values in this interval not displayed.  Lab Results  Component Value Date   TSH 2.84 07/06/2018   No results found for: HGBA1C No results found for: CHOL, HDL, LDLCALC, LDLDIRECT, TRIG, CHOLHDL  Significant Diagnostic Results in last 30 days:  No results found.  Assessment/Plan Community acquired pneumonia of right lung worsened cough, 10/18/18 CXR showed patchy right mid and lower lung opacities with small right pleurla effusion, mild stable cardiomegaly w/o overt congestive heart failure. Will start Doxycycline 100mg  bid x 7days per pharmacy recommendation, FloraStor bid x 7 days, Prednisone 20mg  qd. Observe.   Diastolic CHF (HCC) stable, continue Torsemide 20mg  qd.  Atrial fibrillation (HCC) heart rate is in control, continue Amiodarone 200mg  qd, Eliquis 2.5mg  bid, Diltiazem 120mg  qd, Metoprolol 12.5mg  qd.    COPD (chronic obstructive pulmonary disease) (HCC) Worsened cough, continue Spiriva 72mcg qd, DuoNeb q6h prn, Advair bid. She denied chest pain/pressures, palpitation, or increased SOB. Continue O2 via [place]    Family/ staff Communication: plan of care reviewed with the patient and charge nurse.   Labs/tests ordered: none  Time spend 25 minutes.

## 2018-10-20 NOTE — Assessment & Plan Note (Signed)
Worsened cough, continue Spiriva 45mcg qd, DuoNeb q6h prn, Advair bid. She denied chest pain/pressures, palpitation, or increased SOB. Continue O2 via [place]

## 2018-10-20 NOTE — Assessment & Plan Note (Signed)
heart rate is in control, continue Amiodarone 200mg  qd, Eliquis 2.5mg  bid, Diltiazem 120mg  qd, Metoprolol 12.5mg  qd.

## 2018-10-20 NOTE — Assessment & Plan Note (Signed)
stable, continue Torsemide 20mg  qd.

## 2018-10-20 NOTE — Assessment & Plan Note (Addendum)
worsened cough, 10/18/18 CXR showed patchy right mid and lower lung opacities with small right pleurla effusion, mild stable cardiomegaly w/o overt congestive heart failure. Will start Doxycycline 100mg  bid x 7days per pharmacy recommendation, FloraStor bid x 7 days, Prednisone 20mg  qd. Observe.

## 2018-11-17 ENCOUNTER — Non-Acute Institutional Stay (SKILLED_NURSING_FACILITY): Payer: Medicare Other | Admitting: Nurse Practitioner

## 2018-11-17 ENCOUNTER — Encounter: Payer: Self-pay | Admitting: Nurse Practitioner

## 2018-11-17 DIAGNOSIS — I4891 Unspecified atrial fibrillation: Secondary | ICD-10-CM | POA: Diagnosis not present

## 2018-11-17 DIAGNOSIS — E032 Hypothyroidism due to medicaments and other exogenous substances: Secondary | ICD-10-CM | POA: Diagnosis not present

## 2018-11-17 DIAGNOSIS — I5032 Chronic diastolic (congestive) heart failure: Secondary | ICD-10-CM | POA: Diagnosis not present

## 2018-11-17 DIAGNOSIS — K219 Gastro-esophageal reflux disease without esophagitis: Secondary | ICD-10-CM

## 2018-11-17 DIAGNOSIS — K5901 Slow transit constipation: Secondary | ICD-10-CM | POA: Diagnosis not present

## 2018-11-17 DIAGNOSIS — J449 Chronic obstructive pulmonary disease, unspecified: Secondary | ICD-10-CM

## 2018-11-17 LAB — CHLORIDE
Calcium: 9
Carbon Dioxide, Total: 35
Chloride: 95

## 2018-11-17 NOTE — Assessment & Plan Note (Signed)
Heart rate is in control, continue  Metoprolol 12.5mg  qd, Amiodarone 200mg qd, Eliquis 2.5mg  bid, Diltiazem 120mg  qd.

## 2018-11-17 NOTE — Assessment & Plan Note (Signed)
stable, continue  Linzess 152mcg qod, Senokot S II bid.

## 2018-11-17 NOTE — Assessment & Plan Note (Signed)
Stable, continue O2 dependent, Advair 250/26mcg bid, prn DuoNeb q6h, Spiriva qd.

## 2018-11-17 NOTE — Progress Notes (Signed)
Location:   SNF [place] Room Number: 58 Place of Service:  SNF (31) Provider:  Erdine Hulen NP  [name], MD  Patient Care Team: [name], MD as PCP - General (Internal Medicine) Debara Pickett Nadean Corwin, MD as PCP - Cardiology (Cardiology) Nicholas Lose, MD as Consulting Physician (Hematology and Oncology)  Extended Emergency Contact Information Primary Emergency Contact: Little Ishikawa Mobile Phone: [phone] Relation: Niece Secondary Emergency Contact: Cromer,Chad Mobile Phone: [phone] Relation: Nephew  Code Status:  DNR Goals of care: Advanced Directive information Advanced Directives 11/17/2018  Does Patient Have a Medical Advance Directive? Yes  Type of Paramedic of [place];Living will;Out of facility DNR (pink MOST or yellow form)  Does patient want to make changes to medical advance directive? No - Patient declined  Copy of [place] in Chart? Yes - validated most recent copy scanned in chart (See row information)  Would patient like information on creating a medical advance directive? -  Pre-existing out of facility DNR order (yellow form or pink MOST form) Yellow form placed in chart (order not valid for inpatient use)     Chief Complaint  Patient presents with  . Medical Management of Chronic Issues    HPI:  Pt is a 85 y.o. female seen today for medical management of chronic diseases.    The patient resides in SNF [facility] for care assistance. Hx of COPD, O2 dependent, on Advair 250/76mg bid, prn DuoNeb q6h, Spiriva qd. CHF, compensated, on Torsemide 234mqd. Afib, heart rate is in control, on Metoprolol 12.79m32md, Amiodarone 200m50m Eliquis 2.79mg 16m, Diltiazem 120mg 779mConstipation, stable, on Linzess 1479mcg 32m Senokot S II bid. GERD, stable, taking Pantoprazole 40mg qd60mpothyroidism, taking Levothyroxine 100mcg, l58mTSH wnl 06/2018.   Past Medical History:  Diagnosis Date  . Arthritis   .  Constipation   . COPD (chronic obstructive pulmonary disease) (HCC)   . [place]   . Frequent UTI   . GERD (gastroesophageal reflux disease)   . History of shingles 2007  . Hyperlipemia   . Hypertension   . Hypothyroidism   . Multiple allergies   . Osteoporosis   . Paget's carcinoma of the nipple (HCC) 1/30[place]15  . Pneumonia   . Seasonal allergies   . [facility]  . Thyroid disease   . Uterine cancer (HCC)    [facility] Surgical History:  Procedure Laterality Date  . ABDOMINAL HYSTERECTOMY    . BREAST LUMPECTOMY     left breast  . CAROTID ENDARTERECTOMY Left June 10, 2008   CE  . EYE SURGERY     cataracts removed bilaterally  . TONSILLECTOMY      Allergies  Allergen Reactions  . Sulfa Antibiotics Shortness Of Breath and Palpitations  . Sulfamethoxazole Shortness Of Breath and Palpitations  . Sulfur Shortness Of Breath and Palpitations    "Allergic," per MAR (although I believe they might've meant "Sulfa"??)  . Amoxicillin Rash  . Penicillin G Rash    Did it involve swelling of the face/tongue/throat, SOB, or low BP? Yes Did it involve sudden or severe rash/hives, skin peeling, or any reaction on the inside of your mouth or nose? Unk Did you need to seek medical attention at a hospital or doctor's office? Unk When did it last happen? "it was a long time ago" If all above answers are "NO", may proceed with cephalosporin use.   . PenicilMarland Kitchenins Rash    Did it  involve swelling of the face/tongue/throat, SOB, or low BP? Yes Did it involve sudden or severe rash/hives, skin peeling, or any reaction on the inside of your mouth or nose? Unk Did you need to seek medical attention at a hospital or doctor's office? Unk When did it last happen? "it was a long time ago" If all above answers are "NO", may proceed with cephalosporin use.   . Tape Other (See Comments)    SKIN IS VERY FRAGILE- PLEASE USE AN ALTERNATIVE TO TAPE, AS THE SKIN TEARS EASILY!!     Allergies as of 11/17/2018      Reactions   Sulfa Antibiotics Shortness Of Breath, Palpitations   Sulfamethoxazole Shortness Of Breath, Palpitations   Sulfur Shortness Of Breath, Palpitations   "Allergic," per MAR (although I believe they might've meant "Sulfa"??)   Amoxicillin Rash   Penicillin G Rash   Did it involve swelling of the face/tongue/throat, SOB, or low BP? Yes Did it involve sudden or severe rash/hives, skin peeling, or any reaction on the inside of your mouth or nose? Unk Did you need to seek medical attention at a hospital or doctor's office? Unk When did it last happen? "it was a long time ago" If all above answers are "NO", may proceed with cephalosporin use.   Penicillins Rash   Did it involve swelling of the face/tongue/throat, SOB, or low BP? Yes Did it involve sudden or severe rash/hives, skin peeling, or any reaction on the inside of your mouth or nose? Unk Did you need to seek medical attention at a hospital or doctor's office? Unk When did it last happen? "it was a long time ago" If all above answers are "NO", may proceed with cephalosporin use.   Tape Other (See Comments)   SKIN IS VERY FRAGILE- PLEASE USE AN ALTERNATIVE TO TAPE, AS THE SKIN TEARS EASILY!!      Medication List       Accurate as of November 17, 2018 11:59 PM. If you have any questions, ask your nurse or doctor.        STOP taking these medications   feeding supplement (PRO-STAT SUGAR FREE 64) Liqd Stopped by: Zack Crager X Zephyra Bernardi, NP     TAKE these medications   amiodarone 200 MG tablet Commonly known as: PACERONE Take 200 mg by mouth daily.   apixaban 2.5 MG Tabs tablet Commonly known as: ELIQUIS Take 1 tablet (2.5 mg total) by mouth 2 (two) times daily.   Demadex 20 MG tablet Generic drug: torsemide Take 20 mg by mouth daily.   diltiazem 120 MG 24 hr capsule Commonly known as: CARDIZEM CD Take 1 capsule (120 mg total) by mouth daily.   ergocalciferol 1.25 MG (50000 UT) capsule  Commonly known as: VITAMIN D2 Take 1,250 Units by mouth once a week. Once a day on Sunday   fluticasone 50 MCG/ACT nasal spray Commonly known as: FLONASE Place 2 sprays into both nostrils every morning.   Fluticasone-Salmeterol 250-50 MCG/DOSE Aepb Commonly known as: ADVAIR Inhale 1 puff into the lungs 2 (two) times a day.   ipratropium-albuterol 0.5-2.5 (3) MG/3ML Soln Commonly known as: DUONEB Take 3 mLs by nebulization every 6 (six) hours as needed (WHEEZING).   levothyroxine 100 MCG tablet Commonly known as: SYNTHROID Take 100 mcg by mouth daily before breakfast.   linaclotide 145 MCG Caps capsule Commonly known as: LINZESS Take 145 mcg by mouth every other day.   metoprolol succinate 25 MG 24 hr tablet Commonly known as: TOPROL-XL Take 12.5  mg by mouth daily.   OXYGEN Inhale 2-4 L into the lungs continuous. SAT MUST BE >90%   pantoprazole 40 MG tablet Commonly known as: PROTONIX Take 40 mg by mouth daily.   potassium chloride 20 MEQ packet Commonly known as: KLOR-CON Take 20 mEq by mouth daily.   Resource Support Liqd Take 120 mLs by mouth 3 (three) times daily between meals.   sennosides-docusate sodium 8.6-50 MG tablet Commonly known as: SENOKOT-S Take 2 tablets by mouth 2 (two) times a day.   tiotropium 18 MCG inhalation capsule Commonly known as: SPIRIVA Place 18 mcg into inhaler and inhale daily.   zinc oxide 20 % ointment Apply 1 application topically as needed for irritation. Apply to buttocks after every incontinent episode and as needed for redness      ROS was provided with assistance of staff.  Review of Systems  Constitutional: Negative for activity change, appetite change, chills, diaphoresis, fatigue, fever and unexpected weight change.  HENT: Positive for hearing loss. Negative for congestion and voice change.   Respiratory: Positive for shortness of breath. Negative for cough and wheezing.        Chronic DOE, O2 dependent.    Cardiovascular: Negative for leg swelling.  Gastrointestinal: Negative for abdominal distention, abdominal pain, constipation, diarrhea, nausea and vomiting.  Genitourinary: Negative for difficulty urinating, dysuria and urgency.  Musculoskeletal: Positive for arthralgias and gait problem.  Skin: Negative for pallor.  Neurological: Negative for dizziness, speech difficulty, weakness and light-headedness.       Memory lapses.   Psychiatric/Behavioral: Negative for agitation, behavioral problems, hallucinations and sleep disturbance. The patient is not nervous/anxious.     Immunization History  Administered Date(s) Administered  . Influenza-Unspecified 11/15/2013  . PPD Test 07/24/2011  . Zoster Recombinat (Shingrix) 12/10/2017   Pertinent  Health Maintenance Due  Topic Date Due  . PNA vac Low Risk Adult (1 of 2 - PCV13) 10/02/1984  . INFLUENZA VACCINE  09/16/2018  . DEXA SCAN  Completed   Fall Risk  01/28/2014  Falls in the past year? Yes  Number falls in past yr: 1  Injury with Fall? No   Functional Status Survey:    Vitals:   11/17/18 1547  BP: 120/75  Pulse: (!) 57  Resp: 20  Temp: 97.6 F (36.4 C)  SpO2: 98%  Weight: 95 lb 6.4 oz (43.3 kg)  Height: 4' 10"  (1.473 m)   Body mass index is 19.94 kg/m. Physical Exam Vitals signs and nursing note reviewed.  Constitutional:      General: She is not in acute distress.    Appearance: Normal appearance. She is normal weight. She is not ill-appearing, toxic-appearing or diaphoretic.  HENT:     Head: Normocephalic and atraumatic.     Nose: Nose normal.     Mouth/Throat:     Mouth: Mucous membranes are moist.  Eyes:     Extraocular Movements: Extraocular movements intact.     Conjunctiva/sclera: Conjunctivae normal.     Pupils: Pupils are equal, round, and reactive to light.  Neck:     Musculoskeletal: Normal range of motion and neck supple.  Cardiovascular:     Rate and Rhythm: Normal rate. Rhythm irregular.      Heart sounds: No murmur.  Pulmonary:     Breath sounds: Rales present. No wheezing or rhonchi.     Comments: Bibasilar rales Abdominal:     General: Bowel sounds are normal. There is no distension.     Palpations: Abdomen is soft.  Tenderness: There is no abdominal tenderness. There is no right CVA tenderness, left CVA tenderness, guarding or rebound.  Musculoskeletal:     Right lower leg: No edema.     Left lower leg: No edema.  Skin:    General: Skin is warm and dry.  Neurological:     General: No focal deficit present.     Mental Status: She is alert. Mental status is at baseline.     Cranial Nerves: No cranial nerve deficit.     Motor: No weakness.     Coordination: Coordination normal.     Gait: Gait abnormal.     Comments: Oriented to person, place.  Psychiatric:        Mood and Affect: Mood normal.        Behavior: Behavior normal.        Thought Content: Thought content normal.     Labs reviewed: Recent Labs    03/17/18 1753  05/01/18 0545 05/02/18 0402  05/21/18 0815 05/22/18 8416 05/23/18 0326  06/08/18 06/29/18 08/18/18 10/05/18  NA  --    < > 134* 133*   < > 138 137 136   < > 137 135* 137 137  K  --    < > 3.9 4.4   < > 3.8 3.3* 3.0*   < > 4.8 4.4 4.0 4.4  CL  --    < > 95* 96*   < > 95* 93* 93*  --  98  --   --  95  CO2  --    < > 29 27   < > 33* 35* 33*  --  32  --   --  35  GLUCOSE  --    < > 140* 131*   < > 96 95 94  --   --   --   --   --   BUN  --    < > 34* 43*   < > 26* 26* 25*   < > 38* 45* 54* 51*  CREATININE  --    < > 1.39* 1.50*   < > 1.48* 1.32* 1.17*   < > 1.5* 1.9* 1.8* 1.6*  CALCIUM  --    < > 8.9 8.7*   < > 9.1 8.6* 8.5*  --  8.7  --   --  9.0  MG 1.7  --  2.0 1.9  --   --   --   --   --   --   --   --   --    < > = values in this interval not displayed.   Recent Labs    05/01/18 0545 05/02/18 0402 05/11/18 05/19/18 2000  AST 19 23 11* 19  ALT 19 24 12 19   ALKPHOS 62 56 56 79  BILITOT 0.5 0.6  --  0.7  PROT 6.3* 5.7*  --  6.3*   ALBUMIN 3.0* 2.9*  --  3.1*   Recent Labs    05/02/18 0402  05/19/18 2000 05/21/18 0815 05/29/18 10/05/18  WBC 9.2   < > 10.0 12.0*  --  7.1  NEUTROABS  --   --  7.9* 9.1*  --  4,771  HGB 9.9*   < > 11.2* 10.4* 9.6* 9.1*  HCT 30.2*   < > 35.8* 31.9* 29* 28*  MCV 93.8  --  94.7 94.4  --   --   PLT 348   < > 248 289 339 294   < > =  values in this interval not displayed.   Lab Results  Component Value Date   TSH 2.84 07/06/2018   No results found for: HGBA1C No results found for: CHOL, HDL, LDLCALC, LDLDIRECT, TRIG, CHOLHDL  Significant Diagnostic Results in last 30 days:  No results found.  Assessment/Plan Diastolic CHF (HCC) Stable, continue Torsemide 86m qd.   Atrial fibrillation (HCC) Heart rate is in control, continue  Metoprolol 12.523mqd, Amiodarone 20037m, Eliquis 2.5mg62md, Diltiazem 120mg106m   COPD (chronic obstructive pulmonary disease) (HCC) Stable, continue O2 dependent, Advair 250/50mcg42m, prn DuoNeb q6h, Spiriva qd.  Slow transit constipation stable, continue  Linzess 145mcg 53m Senokot S II bid.   GERD (gastroesophageal reflux disease) Stable, continue Pantoprazole 40mg qd89mHypothyroidism Last TSH 2.84 07/06/18, continue Levothyroxine 100mcg qd40mKidney disease, chronic, stage IV (severe, EGFR 15-29 ml/min) (HCC) Stable, creat 1.6 at her baseline.     Family/ staff Communication: plan of care reviewed with the patient and charge nurse.   Labs/tests ordered:  none  Time spend 25 minutes.

## 2018-11-17 NOTE — Assessment & Plan Note (Signed)
Stable, creat 1.6 at her baseline.

## 2018-11-17 NOTE — Assessment & Plan Note (Signed)
Stable, continue Torsemide 20mg  qd.

## 2018-11-17 NOTE — Assessment & Plan Note (Signed)
Stable, continue Pantoprazole 40mg qd.  

## 2018-11-17 NOTE — Assessment & Plan Note (Signed)
Last TSH 2.84 07/06/18, continue Levothyroxine 159mcg qd.

## 2018-11-22 ENCOUNTER — Ambulatory Visit: Payer: Medicare Other | Admitting: Cardiology

## 2018-11-23 DIAGNOSIS — Z20828 Contact with and (suspected) exposure to other viral communicable diseases: Secondary | ICD-10-CM | POA: Diagnosis not present

## 2018-12-06 ENCOUNTER — Telehealth: Payer: Self-pay | Admitting: *Deleted

## 2018-12-06 ENCOUNTER — Non-Acute Institutional Stay (SKILLED_NURSING_FACILITY): Payer: Medicare Other | Admitting: Internal Medicine

## 2018-12-06 ENCOUNTER — Encounter: Payer: Self-pay | Admitting: Internal Medicine

## 2018-12-06 DIAGNOSIS — I5032 Chronic diastolic (congestive) heart failure: Secondary | ICD-10-CM

## 2018-12-06 DIAGNOSIS — I4891 Unspecified atrial fibrillation: Secondary | ICD-10-CM

## 2018-12-06 DIAGNOSIS — N1832 Chronic kidney disease, stage 3b: Secondary | ICD-10-CM

## 2018-12-06 DIAGNOSIS — J449 Chronic obstructive pulmonary disease, unspecified: Secondary | ICD-10-CM | POA: Diagnosis not present

## 2018-12-06 DIAGNOSIS — R001 Bradycardia, unspecified: Secondary | ICD-10-CM

## 2018-12-06 NOTE — Progress Notes (Signed)
Location: [place] Room Number: 56 Place of Service:  SNF (31)  Provider:   Code Status:  Goals of Care:  Advanced Directives 11/17/2018  Does Patient Have a Medical Advance Directive? Yes  Type of Paramedic of [place];Living will;Out of facility DNR (pink MOST or yellow form)  Does patient want to make changes to medical advance directive? No - Patient declined  Copy of [place] in Chart? Yes - validated most recent copy scanned in chart (See row information)  Would patient like information on creating a medical advance directive? -  Pre-existing out of facility DNR order (yellow form or pink MOST form) Yellow form placed in chart (order not valid for inpatient use)     Chief Complaint  Patient presents with  . Acute Visit    Bradycardia     HPI: Patient is a 85 y.o. female seen today for an acute visit for Bradycardia Patient has PMH ofTIA in 2010 s/p CEA in 2010, COPD on oxygen at night, Hypertension,GERD,Hypothyroidism, Gastritis and Paget's disease of Nipple And Recently she has been diagnosed withNew onset Atrial Fibrillation, CHF, bilateral pleural effusions requiring thoracocentesis and right upper lobe nodule suspicious of cancer.  She has been stable in Facility. No Weakness or syncope. Still works with therapy. Her appetite is fair. Weight is stable For past few weeks her HR has been less then 60. Her SBP runs low in 90. She is asymptomatic.   Past Medical History:  Diagnosis Date  . Arthritis   . Constipation   . COPD (chronic obstructive pulmonary disease) ([place])   . Fatigue   . Frequent UTI   . GERD (gastroesophageal reflux disease)   . History of shingles 2007  . Hyperlipemia   . Hypertension   . Hypothyroidism   . Multiple allergies   . Osteoporosis   . Paget's carcinoma of the nipple ([place]) 03/16/2013  . Pneumonia   . Seasonal allergies   . Stroke [facility]) 2010   no residual  .  Thyroid disease   . Uterine cancer [facility])     Past Surgical History:  Procedure Laterality Date  . ABDOMINAL HYSTERECTOMY    . BREAST LUMPECTOMY     left breast  . CAROTID ENDARTERECTOMY Left June 10, 2008   CE  . EYE SURGERY     cataracts removed bilaterally  . TONSILLECTOMY      Allergies  Allergen Reactions  . Sulfa Antibiotics Shortness Of Breath and Palpitations  . Sulfamethoxazole Shortness Of Breath and Palpitations  . Sulfur Shortness Of Breath and Palpitations    "Allergic," per MAR (although I believe they might've meant "Sulfa"??)  . Amoxicillin Rash  . Penicillin G Rash    Did it involve swelling of the face/tongue/throat, SOB, or low BP? Yes Did it involve sudden or severe rash/hives, skin peeling, or any reaction on the inside of your mouth or nose? Unk Did you need to seek medical attention at a hospital or doctor's office? Unk When did it last happen? "it was a long time ago" If all above answers are "NO", may proceed with cephalosporin use.   Marland Kitchen Penicillins Rash    Did it involve swelling of the face/tongue/throat, SOB, or low BP? Yes Did it involve sudden or severe rash/hives, skin peeling, or any reaction on the inside of your mouth or nose? Unk Did you need to seek medical attention at a hospital or doctor's office? Unk When did it last happen? "  it was a long time ago" If all above answers are "NO", may proceed with cephalosporin use.   . Tape Other (See Comments)    SKIN IS VERY FRAGILE- PLEASE USE AN ALTERNATIVE TO TAPE, AS THE SKIN TEARS EASILY!!    Outpatient Encounter Medications as of 12/06/2018  Medication Sig  . amiodarone (PACERONE) 200 MG tablet Take 200 mg by mouth daily.  Marland Kitchen apixaban (ELIQUIS) 2.5 MG TABS tablet Take 1 tablet (2.5 mg total) by mouth 2 (two) times daily.  Marland Kitchen diltiazem (CARDIZEM CD) 120 MG 24 hr capsule Take 1 capsule (120 mg total) by mouth daily.  . ergocalciferol (VITAMIN D2) 1.25 MG (50000 UT) capsule Take 1,250 Units by  mouth once a week. Once a day on Sunday  . fluticasone (FLONASE) 50 MCG/ACT nasal spray Place 2 sprays into both nostrils every morning.  . Fluticasone-Salmeterol (ADVAIR) 250-50 MCG/DOSE AEPB Inhale 1 puff into the lungs 2 (two) times a day.   . ipratropium-albuterol (DUONEB) 0.5-2.5 (3) MG/3ML SOLN Take 3 mLs by nebulization every 6 (six) hours as needed (WHEEZING).  Marland Kitchen levothyroxine (SYNTHROID) 100 MCG tablet Take 100 mcg by mouth daily before breakfast.  . linaclotide (LINZESS) 145 MCG CAPS capsule Take 145 mcg by mouth every other day.  . Nutritional Supplements (RESOURCE 2.0) LIQD Take by mouth. 120 mL TID B/W meals Three Times A Day Between Meals  . OXYGEN Inhale 2-4 L into the lungs continuous. SAT MUST BE >90%  . pantoprazole (PROTONIX) 40 MG tablet Take 40 mg by mouth daily.   . potassium chloride (KLOR-CON) 20 MEQ packet Take 20 mEq by mouth daily.   . sennosides-docusate sodium (SENOKOT-S) 8.6-50 MG tablet Take 2 tablets by mouth 2 (two) times a day.   . tiotropium (SPIRIVA) 18 MCG inhalation capsule Place 18 mcg into inhaler and inhale daily.  Marland Kitchen torsemide (DEMADEX) 20 MG tablet Take 20 mg by mouth daily.  Marland Kitchen zinc oxide 20 % ointment Apply 1 application topically as needed for irritation. Apply to buttocks after every incontinent episode and as needed for redness  . [DISCONTINUED] metoprolol succinate (TOPROL-XL) 25 MG 24 hr tablet Take 12.5 mg by mouth daily.   . [DISCONTINUED] Nutritional Supplements (RESOURCE SUPPORT) LIQD Take 120 mLs by mouth 3 (three) times daily between meals.    No facility-administered encounter medications on file as of 12/06/2018.     Review of Systems:  Review of Systems  Review of Systems  Constitutional: Negative for activity change, appetite change, chills, diaphoresis, fatigue and fever.  HENT: Negative for mouth sores, postnasal drip, rhinorrhea, sinus pain and sore throat.   Respiratory: Negative for apnea, cough, chest tightness, shortness of  breath and wheezing.   Cardiovascular: Negative for chest pain, palpitations and leg swelling.  Gastrointestinal: Negative for abdominal distention, abdominal pain, constipation, diarrhea, nausea and vomiting.  Genitourinary: Negative for dysuria and frequency.  Musculoskeletal: Negative for arthralgias, joint swelling and myalgias.  Skin: Negative for rash.  Neurological: Negative for dizziness, syncope, weakness, light-headedness and numbness.  Psychiatric/Behavioral: Negative for behavioral problems, confusion and sleep disturbance.     Health Maintenance  Topic Date Due  . TETANUS/TDAP  10/03/1936  . PNA vac Low Risk Adult (1 of 2 - PCV13) 10/02/1984  . INFLUENZA VACCINE  09/16/2018  . DEXA SCAN  Completed    Physical Exam: Vitals:   12/06/18 1500  BP: (!) 107/57  Pulse: 68  Resp: 18  Temp: 97.8 F (36.6 C)  SpO2: 94%  Weight: 95 lb 4.8  oz (43.2 kg)  Height: 4\' 10"  (1.473 m)   Body mass index is 19.92 kg/m. Physical Exam  Constitutional: Oriented to person, place, and time. Well-developed HENT:  Head: Normocephalic.  Mouth/Throat: Oropharynx is clear and moist.  Eyes: Pupils are equal, round, and reactive to light.  Neck: Neck supple.  Cardiovascular: Normal rate and normal heart sounds.   Pulmonary/Chest: Effort normal and breath sounds normal. No respiratory distress. No wheezes. She has no rales.  Abdominal: Soft. Bowel sounds are normal. No distension. There is no tenderness. There is no rebound.  Musculoskeletal: No edema.  Lymphadenopathy: none Neurological: Alert and oriented to person, place, and time. No Focal Deficits Skin: Skin is warm and dry.  Psychiatric: Normal mood and affect. Behavior is normal. Thought content normal.    Labs reviewed: Basic Metabolic Panel: Recent Labs    03/17/18 1753  04/27/18 1523  05/01/18 0545 05/02/18 0402  05/21/18 0815 05/22/18 0814 05/23/18 0326  06/08/18 06/26/18 06/29/18 07/06/18 08/18/18 10/05/18  NA  --     < >  --    < > 134* 133*   < > 138 137 136   < > 137  --  135*  --  137 137  K  --    < >  --    < > 3.9 4.4   < > 3.8 3.3* 3.0*   < > 4.8  --  4.4  --  4.0 4.4  CL  --    < >  --    < > 95* 96*   < > 95* 93* 93*  --  98  --   --   --   --  95  CO2  --    < >  --    < > 29 27   < > 33* 35* 33*  --  32  --   --   --   --  35  GLUCOSE  --    < >  --    < > 140* 131*   < > 96 95 94  --   --   --   --   --   --   --   BUN  --    < >  --    < > 34* 43*   < > 26* 26* 25*   < > 38*  --  45*  --  54* 51*  CREATININE  --    < >  --    < > 1.39* 1.50*   < > 1.48* 1.32* 1.17*   < > 1.5*  --  1.9*  --  1.8* 1.6*  CALCIUM  --    < >  --    < > 8.9 8.7*   < > 9.1 8.6* 8.5*  --  8.7  --   --   --   --  9.0  MG 1.7  --   --   --  2.0 1.9  --   --   --   --   --   --   --   --   --   --   --   TSH  --    < > 10.029*  --   --   --   --   --   --   --   --   --  1.16  --  2.84  --   --    < > = values  in this interval not displayed.   Liver Function Tests: Recent Labs    05/01/18 0545 05/02/18 0402 05/11/18 05/19/18 2000  AST 19 23 11* 19  ALT 19 24 12 19   ALKPHOS 62 56 56 79  BILITOT 0.5 0.6  --  0.7  PROT 6.3* 5.7*  --  6.3*  ALBUMIN 3.0* 2.9*  --  3.1*   No results for input(s): LIPASE, AMYLASE in the last 8760 hours. No results for input(s): AMMONIA in the last 8760 hours. CBC: Recent Labs    05/02/18 0402  05/19/18 2000 05/21/18 0815 05/29/18 10/05/18  WBC 9.2   < > 10.0 12.0*  --  7.1  NEUTROABS  --   --  7.9* 9.1*  --  4,771  HGB 9.9*   < > 11.2* 10.4* 9.6* 9.1*  HCT 30.2*   < > 35.8* 31.9* 29* 28*  MCV 93.8  --  94.7 94.4  --   --   PLT 348   < > 248 289 339 294   < > = values in this interval not displayed.   Lipid Panel: No results for input(s): CHOL, HDL, LDLCALC, TRIG, CHOLHDL, LDLDIRECT in the last 8760 hours. No results found for: HGBA1C  Procedures since last visit: No results found.  Assessment/Plan Bradycardia Will discontinue her Toprol.If she stays stable will  decrease the dose of Amiodarone  Atrial fibrillation, unspecified type ([place]) Will continue Cardizem and Amiodarone for now On Low dose of Eliquis  Chronic diastolic congestive heart failure  Weight stable on Demadex  COPD On Spiriva and Advair Chronic Oxygen Staying stable  Stage 3b chronic kidney disease Creat stable Possible suspicious Upper right Lobe nodule ? Bronchogenic carcinoma Patient is aware and does not want any treatment right now Anemia Hgb low but stable Constipation Doing good on Linzess    Labs/tests ordered:  * No order type specified * Next appt:  Visit date not found

## 2018-12-06 NOTE — Telephone Encounter (Signed)
A message was left,re: follow up appointment. 

## 2018-12-08 ENCOUNTER — Encounter: Payer: Self-pay | Admitting: Nurse Practitioner

## 2018-12-08 ENCOUNTER — Non-Acute Institutional Stay (SKILLED_NURSING_FACILITY): Payer: Medicare Other | Admitting: Nurse Practitioner

## 2018-12-08 DIAGNOSIS — I4811 Longstanding persistent atrial fibrillation: Secondary | ICD-10-CM

## 2018-12-08 DIAGNOSIS — K5901 Slow transit constipation: Secondary | ICD-10-CM | POA: Diagnosis not present

## 2018-12-08 DIAGNOSIS — R1031 Right lower quadrant pain: Secondary | ICD-10-CM | POA: Diagnosis not present

## 2018-12-08 NOTE — Assessment & Plan Note (Addendum)
12/08/18 the patient self felt the right interior ramus region pain, palpated pain upon my examination and reported  with weight bearing for 2-3 months, progressing, better when lying down and off pressure. No recollection of injury. Will obtain X-ray R hip, pelvis 3 views, activity as tolerated, Tylenol 500mg  bid po. Observe.

## 2018-12-08 NOTE — Assessment & Plan Note (Signed)
stable, continue Senokot I II bid, Linzess 152mcg qod

## 2018-12-08 NOTE — Progress Notes (Signed)
Location:   [facility] [place] Room Number: 3 Place of Service:  SNF (31) Provider:  ,  NP  [name], MD  Patient Care Team: [name], MD as PCP - General (Internal Medicine) Debara Pickett Nadean Corwin, MD as PCP - Cardiology (Cardiology) Nicholas Lose, MD as Consulting Physician (Hematology and Oncology)  Extended Emergency Contact Information Primary Emergency Contact: Little Ishikawa Mobile Phone: [phone] Relation: Niece Secondary Emergency Contact: Cromer,Chad Mobile Phone: [phone] Relation: Nephew  Code Status:  DNR Goals of care: Advanced Directive information Advanced Directives 11/17/2018  Does Patient Have a Medical Advance Directive? Yes  Type of Paramedic of [place];Living will;Out of facility DNR (pink MOST or yellow form)  Does patient want to make changes to medical advance directive? No - Patient declined  Copy of [place] in Chart? Yes - validated most recent copy scanned in chart (See row information)  Would patient like information on creating a medical advance directive? -  Pre-existing out of facility DNR order (yellow form or pink MOST form) Yellow form placed in chart (order not valid for inpatient use)     Chief Complaint  Patient presents with  . Acute Visit    Pain in right groin    HPI:  Pt is a 85 y.o. female seen today for an acute visit for 12/08/18 c/o the right interior ramus region pain palpated and with weight bearing for 2-3 months, progressing, better when lying down and off pressure. No recollection of injury.   Hx of constipation, stable, on Senokot I II bid, Linzess 185mcg qod. Afib, heart rate is in control, on Diltiazem 120mg  qd, Amiodarone 200mg  qd, Eliquis 2.5mg  bid.   Past Medical History:  Diagnosis Date  . Arthritis   . Constipation   . COPD (chronic obstructive pulmonary disease) ([place])   . Fatigue   . Frequent UTI   . GERD (gastroesophageal reflux disease)    . History of shingles 2007  . Hyperlipemia   . Hypertension   . Hypothyroidism   . Multiple allergies   . Osteoporosis   . Paget's carcinoma of the nipple ([place]) 03/16/2013  . Pneumonia   . Seasonal allergies   . Stroke [facility]) 2010   no residual  . Thyroid disease   . Uterine cancer [facility])    Past Surgical History:  Procedure Laterality Date  . ABDOMINAL HYSTERECTOMY    . BREAST LUMPECTOMY     left breast  . CAROTID ENDARTERECTOMY Left June 10, 2008   CE  . EYE SURGERY     cataracts removed bilaterally  . TONSILLECTOMY      Allergies  Allergen Reactions  . Sulfa Antibiotics Shortness Of Breath and Palpitations  . Sulfamethoxazole Shortness Of Breath and Palpitations  . Sulfur Shortness Of Breath and Palpitations    "Allergic," per MAR (although I believe they might've meant "Sulfa"??)  . Amoxicillin Rash  . Penicillin G Rash    Did it involve swelling of the face/tongue/throat, SOB, or low BP? Yes Did it involve sudden or severe rash/hives, skin peeling, or any reaction on the inside of your mouth or nose? Unk Did you need to seek medical attention at a hospital or doctor's office? Unk When did it last happen? "it was a long time ago" If all above answers are "NO", may proceed with cephalosporin use.   Marland Kitchen Penicillins Rash    Did it involve swelling of the face/tongue/throat, SOB, or low BP? Yes Did it involve  sudden or severe rash/hives, skin peeling, or any reaction on the inside of your mouth or nose? Unk Did you need to seek medical attention at a hospital or doctor's office? Unk When did it last happen? "it was a long time ago" If all above answers are "NO", may proceed with cephalosporin use.   . Tape Other (See Comments)    SKIN IS VERY FRAGILE- PLEASE USE AN ALTERNATIVE TO TAPE, AS THE SKIN TEARS EASILY!!    Allergies as of 12/08/2018      Reactions   Sulfa Antibiotics Shortness Of Breath, Palpitations   Sulfamethoxazole Shortness Of Breath, Palpitations    Sulfur Shortness Of Breath, Palpitations   "Allergic," per MAR (although I believe they might've meant "Sulfa"??)   Amoxicillin Rash   Penicillin G Rash   Did it involve swelling of the face/tongue/throat, SOB, or low BP? Yes Did it involve sudden or severe rash/hives, skin peeling, or any reaction on the inside of your mouth or nose? Unk Did you need to seek medical attention at a hospital or doctor's office? Unk When did it last happen? "it was a long time ago" If all above answers are "NO", may proceed with cephalosporin use.   Penicillins Rash   Did it involve swelling of the face/tongue/throat, SOB, or low BP? Yes Did it involve sudden or severe rash/hives, skin peeling, or any reaction on the inside of your mouth or nose? Unk Did you need to seek medical attention at a hospital or doctor's office? Unk When did it last happen? "it was a long time ago" If all above answers are "NO", may proceed with cephalosporin use.   Tape Other (See Comments)   SKIN IS VERY FRAGILE- PLEASE USE AN ALTERNATIVE TO TAPE, AS THE SKIN TEARS EASILY!!      Medication List       Accurate as of December 08, 2018  4:15 PM. If you have any questions, ask your nurse or doctor.        amiodarone 200 MG tablet Commonly known as: PACERONE Take 200 mg by mouth daily.   apixaban 2.5 MG Tabs tablet Commonly known as: ELIQUIS Take 1 tablet (2.5 mg total) by mouth 2 (two) times daily.   Demadex 20 MG tablet Generic drug: torsemide Take 20 mg by mouth daily.   diltiazem 120 MG 24 hr capsule Commonly known as: CARDIZEM CD Take 1 capsule (120 mg total) by mouth daily.   ergocalciferol 1.25 MG (50000 UT) capsule Commonly known as: VITAMIN D2 Take 1,250 Units by mouth once a week. Once a day on Sunday   fluticasone 50 MCG/ACT nasal spray Commonly known as: FLONASE Place 2 sprays into both nostrils every morning.   Fluticasone-Salmeterol 250-50 MCG/DOSE Aepb Commonly known as: ADVAIR Inhale 1 puff  into the lungs 2 (two) times a day.   ipratropium-albuterol 0.5-2.5 (3) MG/3ML Soln Commonly known as: DUONEB Take 3 mLs by nebulization every 6 (six) hours as needed (WHEEZING).   levothyroxine 100 MCG tablet Commonly known as: SYNTHROID Take 100 mcg by mouth daily before breakfast.   linaclotide 145 MCG Caps capsule Commonly known as: LINZESS Take 145 mcg by mouth every other day.   OXYGEN Inhale 2-4 L into the lungs continuous. SAT MUST BE >90%   pantoprazole 40 MG tablet Commonly known as: PROTONIX Take 40 mg by mouth daily.   potassium chloride 20 MEQ packet Commonly known as: KLOR-CON Take 20 mEq by mouth daily.   Resource 2.0 Liqd Take by mouth.  120 mL TID B/W meals Three Times A Day Between Meals   sennosides-docusate sodium 8.6-50 MG tablet Commonly known as: SENOKOT-S Take 2 tablets by mouth 2 (two) times a day.   tiotropium 18 MCG inhalation capsule Commonly known as: SPIRIVA Place 18 mcg into inhaler and inhale daily.   zinc oxide 20 % ointment Apply 1 application topically as needed for irritation. Apply to buttocks after every incontinent episode and as needed for redness      ROS was provided with assistance of staff.  Review of Systems  Constitutional: Negative for activity change, appetite change, chills, diaphoresis, fatigue and fever.  HENT: Positive for hearing loss. Negative for congestion and voice change.   Eyes: Negative for visual disturbance.  Respiratory: Positive for shortness of breath. Negative for cough and wheezing.        DOE. O2 dependent.   Gastrointestinal: Negative for abdominal distention, abdominal pain, constipation, diarrhea, nausea and vomiting.  Genitourinary: Negative for difficulty urinating, dysuria and urgency.  Musculoskeletal: Positive for arthralgias and gait problem.       R groin region pain.   Skin: Negative for color change and pallor.  Neurological: Negative for dizziness, speech difficulty, weakness and  headaches.       Memory lapses.   Psychiatric/Behavioral: Negative for agitation, behavioral problems, hallucinations and sleep disturbance. The patient is not nervous/anxious.     Immunization History  Administered Date(s) Administered  . Influenza-Unspecified 11/15/2013  . PPD Test 07/24/2011  . Zoster Recombinat (Shingrix) 12/10/2017   Pertinent  Health Maintenance Due  Topic Date Due  . PNA vac Low Risk Adult (1 of 2 - PCV13) 10/02/1984  . INFLUENZA VACCINE  09/16/2018  . DEXA SCAN  Completed   Fall Risk  01/28/2014  Falls in the past year? Yes  Number falls in past yr: 1  Injury with Fall? No   Functional Status Survey:    Vitals:   12/08/18 1133  BP: (!) 107/57  Pulse: 68  Resp: 18  Temp: (!) 97.3 F (36.3 C)  SpO2: 98%  Weight: 95 lb 4.8 oz (43.2 kg)  Height: 4\' 10"  (1.473 m)   Body mass index is 19.92 kg/m. Physical Exam Vitals signs and nursing note reviewed.  Constitutional:      General: She is not in acute distress.    Appearance: Normal appearance. She is normal weight. She is not ill-appearing, toxic-appearing or diaphoretic.  HENT:     Head: Normocephalic and atraumatic.     Nose: Nose normal.     Mouth/Throat:     Mouth: Mucous membranes are moist.  Eyes:     Extraocular Movements: Extraocular movements intact.     Conjunctiva/sclera: Conjunctivae normal.     Pupils: Pupils are equal, round, and reactive to light.  Neck:     Musculoskeletal: Normal range of motion.  Cardiovascular:     Rate and Rhythm: Normal rate. Rhythm irregular.     Heart sounds: No murmur.  Pulmonary:     Breath sounds: No wheezing, rhonchi or rales.     Comments: O2 dependent.  Abdominal:     General: Bowel sounds are normal. There is no distension.     Palpations: Abdomen is soft.     Tenderness: There is no abdominal tenderness. There is no right CVA tenderness, left CVA tenderness, guarding or rebound.  Musculoskeletal:     Right lower leg: No edema.     Left  lower leg: No edema.     Comments: Right  inferior ramus region pain, palpated by the patient self and my examination, reported pain with weight bearing.   Skin:    General: Skin is warm and dry.  Neurological:     General: No focal deficit present.     Mental Status: She is alert. Mental status is at baseline.     Comments: Oriented to person, place.   Psychiatric:        Mood and Affect: Mood normal.        Behavior: Behavior normal.        Thought Content: Thought content normal.        Judgment: Judgment normal.     Labs reviewed: Recent Labs    03/17/18 1753  05/01/18 0545 05/02/18 0402  05/21/18 0815 05/22/18 2876 05/23/18 0326  06/08/18 06/29/18 08/18/18 10/05/18  NA  --    < > 134* 133*   < > 138 137 136   < > 137 135* 137 137  K  --    < > 3.9 4.4   < > 3.8 3.3* 3.0*   < > 4.8 4.4 4.0 4.4  CL  --    < > 95* 96*   < > 95* 93* 93*  --  98  --   --  95  CO2  --    < > 29 27   < > 33* 35* 33*  --  32  --   --  35  GLUCOSE  --    < > 140* 131*   < > 96 95 94  --   --   --   --   --   BUN  --    < > 34* 43*   < > 26* 26* 25*   < > 38* 45* 54* 51*  CREATININE  --    < > 1.39* 1.50*   < > 1.48* 1.32* 1.17*   < > 1.5* 1.9* 1.8* 1.6*  CALCIUM  --    < > 8.9 8.7*   < > 9.1 8.6* 8.5*  --  8.7  --   --  9.0  MG 1.7  --  2.0 1.9  --   --   --   --   --   --   --   --   --    < > = values in this interval not displayed.   Recent Labs    05/01/18 0545 05/02/18 0402 05/11/18 05/19/18 2000  AST 19 23 11* 19  ALT 19 24 12 19   ALKPHOS 62 56 56 79  BILITOT 0.5 0.6  --  0.7  PROT 6.3* 5.7*  --  6.3*  ALBUMIN 3.0* 2.9*  --  3.1*   Recent Labs    05/02/18 0402  05/19/18 2000 05/21/18 0815 05/29/18 10/05/18  WBC 9.2   < > 10.0 12.0*  --  7.1  NEUTROABS  --   --  7.9* 9.1*  --  4,771  HGB 9.9*   < > 11.2* 10.4* 9.6* 9.1*  HCT 30.2*   < > 35.8* 31.9* 29* 28*  MCV 93.8  --  94.7 94.4  --   --   PLT 348   < > 248 289 339 294   < > = values in this interval not displayed.   Lab  Results  Component Value Date   TSH 2.84 07/06/2018   No results found for: HGBA1C No results found for: CHOL, HDL, LDLCALC, LDLDIRECT, TRIG, CHOLHDL  Significant  Diagnostic Results in last 30 days:  No results found.  Assessment/Plan Right groin pain 12/08/18 the patient self felt the right interior ramus region pain, palpated pain upon my examination and reported  with weight bearing for 2-3 months, progressing, better when lying down and off pressure. No recollection of injury. Will obtain X-ray R hip, pelvis 3 views, activity as tolerated, Tylenol 500mg  bid po. Observe.    Atrial fibrillation (HCC) heart rate is in control, continue Diltiazem 120mg  qd, Amiodarone 200mg  qd, Eliquis 2.5mg  bid.    Slow transit constipation stable, continue Senokot I II bid, Linzess 187mcg qod     Family/ staff Communication: plan of care reviewed with the patient and charge nurse.   Labs/tests ordered:  X-ray pelvis/R hip 3 views.  Time spend 25 minutes.

## 2018-12-08 NOTE — Assessment & Plan Note (Signed)
heart rate is in control, continue Diltiazem 120mg  qd, Amiodarone 200mg  qd, Eliquis 2.5mg  bid.

## 2018-12-12 DIAGNOSIS — R102 Pelvic and perineal pain: Secondary | ICD-10-CM | POA: Diagnosis not present

## 2018-12-12 DIAGNOSIS — M25551 Pain in right hip: Secondary | ICD-10-CM | POA: Diagnosis not present

## 2018-12-13 ENCOUNTER — Ambulatory Visit: Payer: Medicare Other | Admitting: Podiatry

## 2018-12-20 ENCOUNTER — Encounter: Payer: Self-pay | Admitting: Internal Medicine

## 2018-12-20 ENCOUNTER — Non-Acute Institutional Stay (SKILLED_NURSING_FACILITY): Payer: Medicare Other | Admitting: Internal Medicine

## 2018-12-20 DIAGNOSIS — I5032 Chronic diastolic (congestive) heart failure: Secondary | ICD-10-CM | POA: Diagnosis not present

## 2018-12-20 DIAGNOSIS — J209 Acute bronchitis, unspecified: Secondary | ICD-10-CM | POA: Diagnosis not present

## 2018-12-20 DIAGNOSIS — I4811 Longstanding persistent atrial fibrillation: Secondary | ICD-10-CM | POA: Diagnosis not present

## 2018-12-20 DIAGNOSIS — E032 Hypothyroidism due to medicaments and other exogenous substances: Secondary | ICD-10-CM

## 2018-12-20 DIAGNOSIS — N1832 Chronic kidney disease, stage 3b: Secondary | ICD-10-CM | POA: Diagnosis not present

## 2018-12-20 DIAGNOSIS — J440 Chronic obstructive pulmonary disease with acute lower respiratory infection: Secondary | ICD-10-CM

## 2018-12-20 NOTE — Progress Notes (Signed)
Location:    [facility]ursing Home Room Number: 26 Place of Service:  SNF (31) Provider:  [name], MD  [name], MD  Patient Care Team: [name], MD as PCP - General (Internal Medicine) Debara Pickett Nadean Corwin, MD as PCP - Cardiology (Cardiology) Nicholas Lose, MD as Consulting Physician (Hematology and Oncology)  Extended Emergency Contact Information Primary Emergency Contact: Little Ishikawa Mobile Phone: [phone] Relation: Niece Secondary Emergency Contact: Cromer,Chad Mobile Phone: [phone] Relation: Nephew  Code Status:  DNR Goals of care: Advanced Directive information Advanced Directives 11/17/2018  Does Patient Have a Medical Advance Directive? Yes  Type of Paramedic of [place];Living will;Out of facility DNR (pink MOST or yellow form)  Does patient want to make changes to medical advance directive? No - Patient declined  Copy of [place] in Chart? Yes - validated most recent copy scanned in chart (See row information)  Would patient like information on creating a medical advance directive? -  Pre-existing out of facility DNR order (yellow form or pink MOST form) Yellow form placed in chart (order not valid for inpatient use)     Chief Complaint  Patient presents with  . Medical Management of Chronic Issues  . Health Maintenance    TDAP, PNA, influenza vaccine    HPI:  Pt is a 85 y.o. female seen today for medical management of chronic diseases.    Patient has PMH ofTIA in 2010 s/p CEA in 2010, COPD on oxygen at night, Hypertension,GERD,Hypothyroidism, Gastritis and Paget's disease of Nipple  Atrial Fibrillation, CHF, bilateral pleural effusions requiring thoracocentesis and right upper lobe nodule suspicious of cancer  Patient has been stable in Facility. Her only complain today was SOB when she works with therapy. No Resting SOB Just Dry cough. No Chest pain no fever Her Appetite is good.Her weight  is up slightly No New nursing issues.   Past Medical History:  Diagnosis Date  . Arthritis   . Constipation   . COPD (chronic obstructive pulmonary disease) ([place])   . Fatigue   . Frequent UTI   . GERD (gastroesophageal reflux disease)   . History of shingles 2007  . Hyperlipemia   . Hypertension   . Hypothyroidism   . Multiple allergies   . Osteoporosis   . Paget's carcinoma of the nipple ([place]) 03/16/2013  . Pneumonia   . Seasonal allergies   . Stroke [facility]) 2010   no residual  . Thyroid disease   . Uterine cancer [facility])    Past Surgical History:  Procedure Laterality Date  . ABDOMINAL HYSTERECTOMY    . BREAST LUMPECTOMY     left breast  . CAROTID ENDARTERECTOMY Left June 10, 2008   CE  . EYE SURGERY     cataracts removed bilaterally  . TONSILLECTOMY      Allergies  Allergen Reactions  . Sulfa Antibiotics Shortness Of Breath and Palpitations  . Sulfamethoxazole Shortness Of Breath and Palpitations  . Sulfur Shortness Of Breath and Palpitations    "Allergic," per MAR (although I believe they might've meant "Sulfa"??)  . Amoxicillin Rash  . Penicillin G Rash    Did it involve swelling of the face/tongue/throat, SOB, or low BP? Yes Did it involve sudden or severe rash/hives, skin peeling, or any reaction on the inside of your mouth or nose? Unk Did you need to seek medical attention at a hospital or doctor's office? Unk When did it last happen? "it was a long time ago" If  all above answers are "NO", may proceed with cephalosporin use.   Marland Kitchen Penicillins Rash    Did it involve swelling of the face/tongue/throat, SOB, or low BP? Yes Did it involve sudden or severe rash/hives, skin peeling, or any reaction on the inside of your mouth or nose? Unk Did you need to seek medical attention at a hospital or doctor's office? Unk When did it last happen? "it was a long time ago" If all above answers are "NO", may proceed with cephalosporin use.   . Tape Other (See Comments)     SKIN IS VERY FRAGILE- PLEASE USE AN ALTERNATIVE TO TAPE, AS THE SKIN TEARS EASILY!!    Allergies as of 12/20/2018      Reactions   Sulfa Antibiotics Shortness Of Breath, Palpitations   Sulfamethoxazole Shortness Of Breath, Palpitations   Sulfur Shortness Of Breath, Palpitations   "Allergic," per MAR (although I believe they might've meant "Sulfa"??)   Amoxicillin Rash   Penicillin G Rash   Did it involve swelling of the face/tongue/throat, SOB, or low BP? Yes Did it involve sudden or severe rash/hives, skin peeling, or any reaction on the inside of your mouth or nose? Unk Did you need to seek medical attention at a hospital or doctor's office? Unk When did it last happen? "it was a long time ago" If all above answers are "NO", may proceed with cephalosporin use.   Penicillins Rash   Did it involve swelling of the face/tongue/throat, SOB, or low BP? Yes Did it involve sudden or severe rash/hives, skin peeling, or any reaction on the inside of your mouth or nose? Unk Did you need to seek medical attention at a hospital or doctor's office? Unk When did it last happen? "it was a long time ago" If all above answers are "NO", may proceed with cephalosporin use.   Tape Other (See Comments)   SKIN IS VERY FRAGILE- PLEASE USE AN ALTERNATIVE TO TAPE, AS THE SKIN TEARS EASILY!!      Medication List       Accurate as of December 20, 2018 10:26 AM. If you have any questions, ask your nurse or doctor.        acetaminophen 500 MG tablet Commonly known as: TYLENOL Take 500 mg by mouth 2 (two) times daily.   amiodarone 200 MG tablet Commonly known as: PACERONE Take 200 mg by mouth daily.   apixaban 2.5 MG Tabs tablet Commonly known as: ELIQUIS Take 1 tablet (2.5 mg total) by mouth 2 (two) times daily.   Demadex 20 MG tablet Generic drug: torsemide Take 20 mg by mouth daily.   diltiazem 120 MG 24 hr capsule Commonly known as: CARDIZEM CD Take 1 capsule (120 mg total) by mouth  daily.   ergocalciferol 1.25 MG (50000 UT) capsule Commonly known as: VITAMIN D2 Take 1,250 Units by mouth once a week. Once a day on Sunday   fluticasone 50 MCG/ACT nasal spray Commonly known as: FLONASE Place 2 sprays into both nostrils every morning.   Fluticasone-Salmeterol 250-50 MCG/DOSE Aepb Commonly known as: ADVAIR Inhale 1 puff into the lungs 2 (two) times a day.   ipratropium-albuterol 0.5-2.5 (3) MG/3ML Soln Commonly known as: DUONEB Take 3 mLs by nebulization every 6 (six) hours as needed (WHEEZING).   levothyroxine 100 MCG tablet Commonly known as: SYNTHROID Take 100 mcg by mouth daily before breakfast.   linaclotide 145 MCG Caps capsule Commonly known as: LINZESS Take 145 mcg by mouth every other day.   OXYGEN Inhale  2-4 L into the lungs continuous. SAT MUST BE >90%   pantoprazole 40 MG tablet Commonly known as: PROTONIX Take 40 mg by mouth daily.   potassium chloride 20 MEQ packet Commonly known as: KLOR-CON Take 20 mEq by mouth daily.   Resource 2.0 Liqd Take by mouth. 120 mL TID B/W meals Three Times A Day Between Meals   sennosides-docusate sodium 8.6-50 MG tablet Commonly known as: SENOKOT-S Take 2 tablets by mouth 2 (two) times a day.   tiotropium 18 MCG inhalation capsule Commonly known as: SPIRIVA Place 18 mcg into inhaler and inhale daily.   zinc oxide 20 % ointment Apply 1 application topically as needed for irritation. Apply to buttocks after every incontinent episode and as needed for redness       Review of Systems  Constitutional: Negative.   HENT: Negative.   Respiratory: Positive for shortness of breath.   Cardiovascular: Negative.   Gastrointestinal: Positive for constipation. Negative for nausea.  Genitourinary: Negative.   Musculoskeletal: Negative.   Skin: Negative.   Neurological: Negative.   Psychiatric/Behavioral: Negative.   All other systems reviewed and are negative.   Immunization History  Administered  Date(s) Administered  . Influenza-Unspecified 11/15/2013  . PPD Test 07/24/2011  . Zoster Recombinat (Shingrix) 12/10/2017   Pertinent  Health Maintenance Due  Topic Date Due  . PNA vac Low Risk Adult (1 of 2 - PCV13) 10/02/1984  . INFLUENZA VACCINE  09/16/2018  . DEXA SCAN  Completed   Fall Risk  01/28/2014  Falls in the past year? Yes  Number falls in past yr: 1  Injury with Fall? No   Functional Status Survey:    Vitals:   12/20/18 1011  BP: (!) 123/59  Pulse: 62  Resp: 18  Temp: (!) 97.1 F (36.2 C)  SpO2: 99%  Weight: 97 lb 9.6 oz (44.3 kg)  Height: 4\' 10"  (1.473 m)   Body mass index is 20.4 kg/m. Physical Exam Vitals signs reviewed.  Constitutional:      Appearance: Normal appearance. She is normal weight.  HENT:     Head: Normocephalic.     Nose: Nose normal.     Mouth/Throat:     Mouth: Mucous membranes are moist.     Pharynx: Oropharynx is clear.  Eyes:     Pupils: Pupils are equal, round, and reactive to light.  Neck:     Musculoskeletal: Neck supple.  Cardiovascular:     Rate and Rhythm: Normal rate and regular rhythm.     Pulses: Normal pulses.  Pulmonary:     Effort: Pulmonary effort is normal. No respiratory distress.     Breath sounds: Normal breath sounds. No wheezing or rales.  Abdominal:     General: Abdomen is flat. Bowel sounds are normal. There is no distension.     Palpations: Abdomen is soft.     Tenderness: There is no abdominal tenderness.  Musculoskeletal:        General: No swelling.  Skin:    General: Skin is warm.  Neurological:     General: No focal deficit present.     Mental Status: She is alert and oriented to person, place, and time.  Psychiatric:        Mood and Affect: Mood normal.        Thought Content: Thought content normal.     Labs reviewed: Recent Labs    03/17/18 1753  05/01/18 0545 05/02/18 0402  05/21/18 0815 05/22/18 7858 05/23/18 0326  06/08/18 06/29/18  08/18/18 10/05/18  NA  --    < > 134*  133*   < > 138 137 136   < > 137 135* 137 137  K  --    < > 3.9 4.4   < > 3.8 3.3* 3.0*   < > 4.8 4.4 4.0 4.4  CL  --    < > 95* 96*   < > 95* 93* 93*  --  98  --   --  95  CO2  --    < > 29 27   < > 33* 35* 33*  --  32  --   --  35  GLUCOSE  --    < > 140* 131*   < > 96 95 94  --   --   --   --   --   BUN  --    < > 34* 43*   < > 26* 26* 25*   < > 38* 45* 54* 51*  CREATININE  --    < > 1.39* 1.50*   < > 1.48* 1.32* 1.17*   < > 1.5* 1.9* 1.8* 1.6*  CALCIUM  --    < > 8.9 8.7*   < > 9.1 8.6* 8.5*  --  8.7  --   --  9.0  MG 1.7  --  2.0 1.9  --   --   --   --   --   --   --   --   --    < > = values in this interval not displayed.   Recent Labs    05/01/18 0545 05/02/18 0402 05/11/18 05/19/18 2000  AST 19 23 11* 19  ALT 19 24 12 19   ALKPHOS 62 56 56 79  BILITOT 0.5 0.6  --  0.7  PROT 6.3* 5.7*  --  6.3*  ALBUMIN 3.0* 2.9*  --  3.1*   Recent Labs    05/02/18 0402  05/19/18 2000 05/21/18 0815 05/29/18 10/05/18  WBC 9.2   < > 10.0 12.0*  --  7.1  NEUTROABS  --   --  7.9* 9.1*  --  4,771  HGB 9.9*   < > 11.2* 10.4* 9.6* 9.1*  HCT 30.2*   < > 35.8* 31.9* 29* 28*  MCV 93.8  --  94.7 94.4  --   --   PLT 348   < > 248 289 339 294   < > = values in this interval not displayed.   Lab Results  Component Value Date   TSH 2.84 07/06/2018   No results found for: HGBA1C No results found for: CHOL, HDL, LDLCALC, LDLDIRECT, TRIG, CHOLHDL  Significant Diagnostic Results in last 30 days:  No results found.  Assessment/Plan  Atrial fibrillation Lopressor was discontinued Has been stable on Amiodarone and Cardizem Last TSH was normal in 5/20 Had Chest Xray in 9/20 On Low dose  Eliquis  Possible suspicious Upper right Lobe nodule ? Bronchogenic carcinoma On her Last Chest Xray she had Patch infiltrate in right side with effusion Was treated with Antibiotics Will Hold Xray for now If Repeat SOB will Repeat Xray D/w The patient. She has refused CT scan before  Chronic diastolic  congestive heart failure (HCC) On Demadex Renal Function is stable Stage 3b chronic kidney disease Stable  Hypothyroidism due to medication TSH was normal On Supplement  COPD (chronic obstructive pulmonary disease) Continue   in inhalers Also on oxygen Anemia Hgb Is low but  stable Constipation  on Linzess 11mcg QOD  Family/ staff Communication:   Labs/tests ordered:   Total time spent in this patient care encounter was  25_  minutes; greater than 50% of the visit spent counseling patient and staff, reviewing records , Labs and coordinating care for problems addressed at this encounter.

## 2018-12-21 DIAGNOSIS — Z20828 Contact with and (suspected) exposure to other viral communicable diseases: Secondary | ICD-10-CM | POA: Diagnosis not present

## 2018-12-22 ENCOUNTER — Ambulatory Visit: Payer: Medicare Other | Admitting: Cardiology

## 2018-12-26 ENCOUNTER — Non-Acute Institutional Stay (SKILLED_NURSING_FACILITY): Payer: Medicare Other | Admitting: Nurse Practitioner

## 2018-12-26 ENCOUNTER — Encounter: Payer: Self-pay | Admitting: Nurse Practitioner

## 2018-12-26 DIAGNOSIS — J449 Chronic obstructive pulmonary disease, unspecified: Secondary | ICD-10-CM | POA: Diagnosis not present

## 2018-12-26 DIAGNOSIS — R040 Epistaxis: Secondary | ICD-10-CM | POA: Insufficient documentation

## 2018-12-26 DIAGNOSIS — I5032 Chronic diastolic (congestive) heart failure: Secondary | ICD-10-CM

## 2018-12-26 DIAGNOSIS — I4811 Longstanding persistent atrial fibrillation: Secondary | ICD-10-CM | POA: Diagnosis not present

## 2018-12-26 DIAGNOSIS — N184 Chronic kidney disease, stage 4 (severe): Secondary | ICD-10-CM

## 2018-12-26 DIAGNOSIS — J309 Allergic rhinitis, unspecified: Secondary | ICD-10-CM

## 2018-12-26 DIAGNOSIS — D649 Anemia, unspecified: Secondary | ICD-10-CM | POA: Diagnosis not present

## 2018-12-26 NOTE — Progress Notes (Addendum)
Location:   [facility] [place] Room Number: 5 Place of Service:  SNF (31) Provider:  Nari Vannatter NP  [name], MD  Patient Care Team: [name], MD as PCP - General (Internal Medicine) Debara Pickett Nadean Corwin, MD as PCP - Cardiology (Cardiology) Nicholas Lose, MD as Consulting Physician (Hematology and Oncology)  Extended Emergency Contact Information Primary Emergency Contact: Little Ishikawa Mobile Phone: [phone] Relation: Niece Secondary Emergency Contact: Cromer,Chad Mobile Phone: [phone] Relation: Nephew  Code Status:  DNR Goals of care: Advanced Directive information Advanced Directives 12/20/2018  Does Patient Have a Medical Advance Directive? Yes  Type of Paramedic of [place];Living will;Out of facility DNR (pink MOST or yellow form)  Does patient want to make changes to medical advance directive? No - Patient declined  Copy of [place] in Chart? -  Would patient like information on creating a medical advance directive? -  Pre-existing out of facility DNR order (yellow form or pink MOST form) -     Chief Complaint  Patient presents with  . Acute Visit    Nose bleed    HPI:  Pt is a 85 y.o. female seen today for an acute visit for left nostril nose bleed, soaked 2 washcloths, stopped by pressure. The patient stated this the 2nd time in her life, the last time was about a month ago. COPD/CHF, O2 dependent, taking Torsemide 23m qd, Spiriva qd, prn DuoNeb, Advair bid. Hx of anemia, Hgb 9.1 10/05/18 .CKD baseline creat 1.6-1.9. Hx of AFib, heart rate is in control, on Amiodarone 2036mqd, Eliquis 2.44m60mid, Diltiazem 120m63m. Allergic rhinitis, taking Flonase qd.   Past Medical History:  Diagnosis Date  . Arthritis   . Constipation   . COPD (chronic obstructive pulmonary disease) (HCC)[place]. Fatigue   . Frequent UTI   . GERD (gastroesophageal reflux disease)   . History of shingles 2007  . Hyperlipemia    . Hypertension   . Hypothyroidism   . Multiple allergies   . Osteoporosis   . Paget's carcinoma of the nipple (HCC)[place]30/2015  . Pneumonia   . Seasonal allergies   . [facility]   no residual  . Thyroid disease   . [facility] Past Surgical History:  Procedure Laterality Date  . ABDOMINAL HYSTERECTOMY    . BREAST LUMPECTOMY     left breast  . CAROTID ENDARTERECTOMY Left June 10, 2008   CE  . EYE SURGERY     cataracts removed bilaterally  . TONSILLECTOMY      Allergies  Allergen Reactions  . Sulfa Antibiotics Shortness Of Breath and Palpitations  . Sulfamethoxazole Shortness Of Breath and Palpitations  . Sulfur Shortness Of Breath and Palpitations    "Allergic," per MAR (although I believe they might've meant "Sulfa"??)  . Amoxicillin Rash  . Penicillin G Rash    Did it involve swelling of the face/tongue/throat, SOB, or low BP? Yes Did it involve sudden or severe rash/hives, skin peeling, or any reaction on the inside of your mouth or nose? Unk Did you need to seek medical attention at a hospital or doctor's office? Unk When did it last happen? "it was a long time ago" If all above answers are "NO", may proceed with cephalosporin use.   . Pe[place]land Kitchenicillins Rash    Did it involve swelling of the face/tongue/throat, SOB, or low BP? Yes Did it involve sudden or severe rash/hives, skin peeling, or  any reaction on the inside of your mouth or nose? Unk Did you need to seek medical attention at a hospital or doctor's office? Unk When did it last happen? "it was a long time ago" If all above answers are "NO", may proceed with cephalosporin use.   . Tape Other (See Comments)    SKIN IS VERY FRAGILE- PLEASE USE AN ALTERNATIVE TO TAPE, AS THE SKIN TEARS EASILY!!    Allergies as of 12/26/2018      Reactions   Sulfa Antibiotics Shortness Of Breath, Palpitations   Sulfamethoxazole Shortness Of Breath, Palpitations   Sulfur Shortness Of Breath, Palpitations    "Allergic," per MAR (although I believe they might've meant "Sulfa"??)   Amoxicillin Rash   Penicillin G Rash   Did it involve swelling of the face/tongue/throat, SOB, or low BP? Yes Did it involve sudden or severe rash/hives, skin peeling, or any reaction on the inside of your mouth or nose? Unk Did you need to seek medical attention at a hospital or doctor's office? Unk When did it last happen? "it was a long time ago" If all above answers are "NO", may proceed with cephalosporin use.   Penicillins Rash   Did it involve swelling of the face/tongue/throat, SOB, or low BP? Yes Did it involve sudden or severe rash/hives, skin peeling, or any reaction on the inside of your mouth or nose? Unk Did you need to seek medical attention at a hospital or doctor's office? Unk When did it last happen? "it was a long time ago" If all above answers are "NO", may proceed with cephalosporin use.   Tape Other (See Comments)   SKIN IS VERY FRAGILE- PLEASE USE AN ALTERNATIVE TO TAPE, AS THE SKIN TEARS EASILY!!      Medication List       Accurate as of December 26, 2018 11:59 PM. If you have any questions, ask your nurse or doctor.        acetaminophen 500 MG tablet Commonly known as: TYLENOL Take 500 mg by mouth 2 (two) times daily.   amiodarone 200 MG tablet Commonly known as: PACERONE Take 200 mg by mouth daily.   apixaban 2.5 MG Tabs tablet Commonly known as: ELIQUIS Take 1 tablet (2.5 mg total) by mouth 2 (two) times daily.   Demadex 20 MG tablet Generic drug: torsemide Take 20 mg by mouth daily.   diltiazem 120 MG 24 hr capsule Commonly known as: CARDIZEM CD Take 1 capsule (120 mg total) by mouth daily.   ergocalciferol 1.25 MG (50000 UT) capsule Commonly known as: VITAMIN D2 Take 1,250 Units by mouth once a week. Once a day on Sunday   fluticasone 50 MCG/ACT nasal spray Commonly known as: FLONASE Place 2 sprays into both nostrils every morning.   Fluticasone-Salmeterol  250-50 MCG/DOSE Aepb Commonly known as: ADVAIR Inhale 1 puff into the lungs 2 (two) times a day.   ipratropium-albuterol 0.5-2.5 (3) MG/3ML Soln Commonly known as: DUONEB Take 3 mLs by nebulization every 6 (six) hours as needed (WHEEZING).   levothyroxine 100 MCG tablet Commonly known as: SYNTHROID Take 100 mcg by mouth daily before breakfast.   linaclotide 145 MCG Caps capsule Commonly known as: LINZESS Take 145 mcg by mouth every other day.   OXYGEN Inhale 2-4 L into the lungs continuous. SAT MUST BE >90%   pantoprazole 40 MG tablet Commonly known as: PROTONIX Take 40 mg by mouth daily.   potassium chloride 20 MEQ packet Commonly known as: KLOR-CON Take 20 mEq  by mouth daily.   Resource 2.0 Liqd Take by mouth. 120 mL TID B/W meals Three Times A Day Between Meals   sennosides-docusate sodium 8.6-50 MG tablet Commonly known as: SENOKOT-S Take 2 tablets by mouth 2 (two) times a day.   tiotropium 18 MCG inhalation capsule Commonly known as: SPIRIVA Place 18 mcg into inhaler and inhale daily.   zinc oxide 20 % ointment Apply 1 application topically as needed for irritation. Apply to buttocks after every incontinent episode and as needed for redness       Review of Systems  Constitutional: Negative for activity change, appetite change, chills, diaphoresis and fatigue.  HENT: Positive for hearing loss and nosebleeds. Negative for congestion, postnasal drip, rhinorrhea, sinus pressure, sinus pain, sneezing, sore throat, trouble swallowing and voice change.   Eyes: Negative for visual disturbance.  Respiratory: Positive for shortness of breath. Negative for cough.        DOE. O2 dependent.   Cardiovascular: Negative for chest pain, palpitations and leg swelling.  Gastrointestinal: Negative for abdominal distention, blood in stool, constipation, diarrhea, nausea and vomiting.  Genitourinary: Negative for difficulty urinating, dysuria and urgency.  Musculoskeletal:  Positive for gait problem.  Skin: Negative for color change and pallor.  Neurological: Negative for dizziness, speech difficulty, weakness and headaches.  Psychiatric/Behavioral: Negative for agitation, behavioral problems, hallucinations and sleep disturbance. The patient is not nervous/anxious.     Immunization History  Administered Date(s) Administered  . Influenza-Unspecified 11/15/2013  . PPD Test 07/24/2011  . Zoster Recombinat (Shingrix) 12/10/2017   Pertinent  Health Maintenance Due  Topic Date Due  . PNA vac Low Risk Adult (1 of 2 - PCV13) 10/02/1984  . INFLUENZA VACCINE  09/16/2018  . DEXA SCAN  Completed   Fall Risk  01/28/2014  Falls in the past year? Yes  Number falls in past yr: 1  Injury with Fall? No   Functional Status Survey:    Vitals:   12/26/18 1604  BP: (!) 123/59  Pulse: 62  Resp: 18  Temp: 98.3 F (36.8 C)  SpO2: 93%  Weight: 96 lb (43.5 kg)  Height: 4' 10"  (1.473 m)   Body mass index is 20.06 kg/m. Physical Exam Vitals signs and nursing note reviewed.  Constitutional:      General: She is not in acute distress.    Appearance: Normal appearance. She is not ill-appearing, toxic-appearing or diaphoretic.  HENT:     Head: Normocephalic and atraumatic.     Nose: No nasal deformity, signs of injury, nasal tenderness, mucosal edema, congestion or rhinorrhea.     Right Nostril: No foreign body, epistaxis, septal hematoma or occlusion.     Left Nostril: Epistaxis present. No foreign body, septal hematoma or occlusion.     Comments: Lateral left nasal small dry blood covered the area.     Mouth/Throat:     Mouth: Mucous membranes are moist.  Eyes:     Extraocular Movements: Extraocular movements intact.     Conjunctiva/sclera: Conjunctivae normal.     Pupils: Pupils are equal, round, and reactive to light.  Neck:     Musculoskeletal: Normal range of motion and neck supple.  Cardiovascular:     Rate and Rhythm: Normal rate and regular rhythm.      Heart sounds: No murmur.  Pulmonary:     Breath sounds: Rales present.     Comments: Right base rales.  Abdominal:     General: There is no distension.     Palpations: Abdomen is  soft.     Tenderness: There is no abdominal tenderness. There is no right CVA tenderness, left CVA tenderness, guarding or rebound.  Musculoskeletal:     Right lower leg: No edema.     Left lower leg: No edema.  Skin:    General: Skin is warm and dry.  Neurological:     General: No focal deficit present.     Mental Status: She is alert and oriented to person, place, and time. Mental status is at baseline.     Cranial Nerves: No cranial nerve deficit.     Motor: No weakness.     Coordination: Coordination normal.     Gait: Gait abnormal.  Psychiatric:        Mood and Affect: Mood normal.        Behavior: Behavior normal.        Thought Content: Thought content normal.        Judgment: Judgment normal.     Labs reviewed: Recent Labs    03/17/18 1753  05/01/18 0545 05/02/18 0402  05/21/18 0815 05/22/18 6962 05/23/18 0326  06/08/18 06/29/18 08/18/18 10/05/18  NA  --    < > 134* 133*   < > 138 137 136   < > 137 135* 137 137  K  --    < > 3.9 4.4   < > 3.8 3.3* 3.0*   < > 4.8 4.4 4.0 4.4  CL  --    < > 95* 96*   < > 95* 93* 93*  --  98  --   --  95  CO2  --    < > 29 27   < > 33* 35* 33*  --  32  --   --  35  GLUCOSE  --    < > 140* 131*   < > 96 95 94  --   --   --   --   --   BUN  --    < > 34* 43*   < > 26* 26* 25*   < > 38* 45* 54* 51*  CREATININE  --    < > 1.39* 1.50*   < > 1.48* 1.32* 1.17*   < > 1.5* 1.9* 1.8* 1.6*  CALCIUM  --    < > 8.9 8.7*   < > 9.1 8.6* 8.5*  --  8.7  --   --  9.0  MG 1.7  --  2.0 1.9  --   --   --   --   --   --   --   --   --    < > = values in this interval not displayed.   Recent Labs    05/01/18 0545 05/02/18 0402 05/11/18 05/19/18 2000  AST 19 23 11* 19  ALT 19 24 12 19   ALKPHOS 62 56 56 79  BILITOT 0.5 0.6  --  0.7  PROT 6.3* 5.7*  --  6.3*  ALBUMIN  3.0* 2.9*  --  3.1*   Recent Labs    05/02/18 0402  05/19/18 2000 05/21/18 0815 05/29/18 10/05/18  WBC 9.2   < > 10.0 12.0*  --  7.1  NEUTROABS  --   --  7.9* 9.1*  --  4,771  HGB 9.9*   < > 11.2* 10.4* 9.6* 9.1*  HCT 30.2*   < > 35.8* 31.9* 29* 28*  MCV 93.8  --  94.7 94.4  --   --  PLT 348   < > 248 289 339 294   < > = values in this interval not displayed.   Lab Results  Component Value Date   TSH 2.84 07/06/2018   No results found for: HGBA1C No results found for: CHOL, HDL, LDLCALC, LDLDIRECT, TRIG, CHOLHDL  Significant Diagnostic Results in last 30 days:  No results found.  Assessment/Plan Epistaxis This is 2nd episode, noted trauma on the lateral left nostril, stopped by pressure. Dryness from O2 or trauma from O2 tubing maybe contributory. Will apply saline nasal gel R+L nostrils bid. Update CBC/diff, CMP/eGFR. Avoid dryness, trauma. Observe.   Chronic anemia Chronic, active nose bleed, update CBC/diff.  12/28/18 Na 136, K 4.8, Bun 68, creat 2.07, eGFR 19, wbc 9.7, Hgb 9.6, plt 355, neutrophils 70.8, BMP one week.    COPD (chronic obstructive pulmonary disease) (HCC) Continue O2, continue Spiriva, Advair, prn DuoNeb.   Diastolic CHF ([place]) Stable, continue Torsemide.   Atrial fibrillation (HCC) Heart rate is in control, continue Cardizem, Amiodarone, Eliquis.   Kidney disease, chronic, stage IV (severe, EGFR 15-29 ml/min) (HCC) Baseline creat 1.6-1.9 12/28/18 Na 136, K 4.8, Bun 68, creat 2.07, eGFR 19, wbc 9.7, Hgb 9.6, plt 355, neutrophils 70.8, BMP one week.    Allergic rhinitis Stable, continue Flonase.      Family/ staff Communication: plan of care reviewed with the patient and charge nurse.   Labs/tests ordered:  CBC/diff, CMP/eGFR  Time spend 25 minutes.

## 2018-12-26 NOTE — Assessment & Plan Note (Signed)
Stable, continue Flonase.

## 2018-12-26 NOTE — Assessment & Plan Note (Signed)
Stable, continue Torsemide.

## 2018-12-26 NOTE — Assessment & Plan Note (Addendum)
Baseline creat 1.6-1.9 12/28/18 Na 136, K 4.8, Bun 68, creat 2.07, eGFR 19, wbc 9.7, Hgb 9.6, plt 355, neutrophils 70.8, BMP one week.   

## 2018-12-26 NOTE — Assessment & Plan Note (Signed)
Continue O2, continue Spiriva, Advair, prn DuoNeb.

## 2018-12-26 NOTE — Assessment & Plan Note (Signed)
This is 2nd episode, noted trauma on the lateral left nostril, stopped by pressure. Dryness from O2 or trauma from O2 tubing maybe contributory. Will apply saline nasal gel R+L nostrils bid. Update CBC/diff, CMP/eGFR. Avoid dryness, trauma. Observe.

## 2018-12-26 NOTE — Assessment & Plan Note (Addendum)
Chronic, active nose bleed, update CBC/diff.  12/28/18 Na 136, K 4.8, Bun 68, creat 2.07, eGFR 19, wbc 9.7, Hgb 9.6, plt 355, neutrophils 70.8, BMP one week.

## 2018-12-26 NOTE — Assessment & Plan Note (Signed)
Heart rate is in control, continue Cardizem, Amiodarone, Eliquis.

## 2018-12-28 DIAGNOSIS — Z20828 Contact with and (suspected) exposure to other viral communicable diseases: Secondary | ICD-10-CM | POA: Diagnosis not present

## 2018-12-28 DIAGNOSIS — R040 Epistaxis: Secondary | ICD-10-CM | POA: Diagnosis not present

## 2018-12-29 DIAGNOSIS — B351 Tinea unguium: Secondary | ICD-10-CM | POA: Diagnosis not present

## 2018-12-29 DIAGNOSIS — M79672 Pain in left foot: Secondary | ICD-10-CM | POA: Diagnosis not present

## 2018-12-29 DIAGNOSIS — L84 Corns and callosities: Secondary | ICD-10-CM | POA: Diagnosis not present

## 2018-12-29 DIAGNOSIS — Q6689 Other  specified congenital deformities of feet: Secondary | ICD-10-CM | POA: Diagnosis not present

## 2018-12-29 DIAGNOSIS — M79671 Pain in right foot: Secondary | ICD-10-CM | POA: Diagnosis not present

## 2019-01-04 DIAGNOSIS — N184 Chronic kidney disease, stage 4 (severe): Secondary | ICD-10-CM | POA: Diagnosis not present

## 2019-01-18 ENCOUNTER — Encounter: Payer: Self-pay | Admitting: Nurse Practitioner

## 2019-01-18 ENCOUNTER — Non-Acute Institutional Stay (SKILLED_NURSING_FACILITY): Payer: Medicare Other | Admitting: Nurse Practitioner

## 2019-01-18 DIAGNOSIS — K219 Gastro-esophageal reflux disease without esophagitis: Secondary | ICD-10-CM

## 2019-01-18 DIAGNOSIS — I4811 Longstanding persistent atrial fibrillation: Secondary | ICD-10-CM | POA: Diagnosis not present

## 2019-01-18 DIAGNOSIS — M25561 Pain in right knee: Secondary | ICD-10-CM

## 2019-01-18 DIAGNOSIS — R079 Chest pain, unspecified: Secondary | ICD-10-CM

## 2019-01-18 DIAGNOSIS — I5032 Chronic diastolic (congestive) heart failure: Secondary | ICD-10-CM | POA: Diagnosis not present

## 2019-01-18 DIAGNOSIS — K5901 Slow transit constipation: Secondary | ICD-10-CM

## 2019-01-18 DIAGNOSIS — G8929 Other chronic pain: Secondary | ICD-10-CM

## 2019-01-18 DIAGNOSIS — J449 Chronic obstructive pulmonary disease, unspecified: Secondary | ICD-10-CM | POA: Diagnosis not present

## 2019-01-18 NOTE — Progress Notes (Addendum)
Location:   [facility] Room Number: 38 Place of Service:  SNF (31) Provider: Lennie Odor Mast NP  [name], MD  Patient Care Team: [name], MD as PCP - General (Internal Medicine) Debara Pickett Nadean Corwin, MD as PCP - Cardiology (Cardiology) Nicholas Lose, MD as Consulting Physician (Hematology and Oncology)  Extended Emergency Contact Information Primary Emergency Contact: Little Ishikawa Mobile Phone: [phone] Relation: Niece Secondary Emergency Contact: Cromer,Chad Mobile Phone: [phone] Relation: Nephew  Code Status:  DNR Goals of care: Advanced Directive information Advanced Directives 12/20/2018  Does Patient Have a Medical Advance Directive? Yes  Type of Paramedic of [place];Living will;Out of facility DNR (pink MOST or yellow form)  Does patient want to make changes to medical advance directive? No - Patient declined  Copy of [place] in Chart? -  Would patient like information on creating a medical advance directive? -  Pre-existing out of facility DNR order (yellow form or pink MOST form) -     Chief Complaint  Patient presents with  . Acute Visit    right chest pain under the breast    HPI:  Pt is a 85 y.o. female seen today for an acute visit for the c/o right chest pain under the breast with deep breathing, no apparent increased SOB, sputum production. She denied falling, injury, nausea, vomiting, change of appetite, abd pain, constipation, or diarrhea. Hx of CHF, COPD, O2 dependent, on Torsemide 20mg  qd, Spiriva qd, GERD, stable, on Pantoprazole 40mg  qd, prn DuoNeb, Advair bid. AFib, heart rate is in control, on Diltiazem 120mg  qd, Eliquis 2.5mg  bid. OA pain, right knee pain,  stable, Tylenol 500mg  bid.    Past Medical History:  Diagnosis Date  . Arthritis   . Constipation   . COPD (chronic obstructive pulmonary disease) ([place])   . Fatigue   . Frequent UTI   . GERD (gastroesophageal reflux  disease)   . History of shingles 2007  . Hyperlipemia   . Hypertension   . Hypothyroidism   . Multiple allergies   . Osteoporosis   . 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 Pneumonia   . Seasonal allergies   . Stroke [facility]) 2010   no residual  . Thyroid disease   . Uterine cancer [facility])    Past Surgical History:  Procedure Laterality Date  . ABDOMINAL HYSTERECTOMY    . BREAST LUMPECTOMY     left breast  . CAROTID ENDARTERECTOMY Left June 10, 2008   CE  . EYE SURGERY     cataracts removed bilaterally  . TONSILLECTOMY      Allergies  Allergen Reactions  . Sulfa Antibiotics Shortness Of Breath and Palpitations  . Sulfamethoxazole Shortness Of Breath and Palpitations  . Sulfur Shortness Of Breath and Palpitations    "Allergic," per MAR (although I believe they might've meant "Sulfa"??)  . Amoxicillin Rash  . Penicillin G Rash    Did it involve swelling of the face/tongue/throat, SOB, or low BP? Yes Did it involve sudden or severe rash/hives, skin peeling, or any reaction on the inside of your mouth or nose? Unk Did you need to seek medical attention at a hospital or doctor's office? Unk When did it last happen? "it was a long time ago" If all above answers are "NO", may proceed with cephalosporin use.   Marland Kitchen Penicillins Rash    Did it involve swelling of the face/tongue/throat, SOB, or low BP? Yes Did it involve sudden  or severe rash/hives, skin peeling, or any reaction on the inside of your mouth or nose? Unk Did you need to seek medical attention at a hospital or doctor's office? Unk When did it last happen? "it was a long time ago" If all above answers are "NO", may proceed with cephalosporin use.   . Tape Other (See Comments)    SKIN IS VERY FRAGILE- PLEASE USE AN ALTERNATIVE TO TAPE, AS THE SKIN TEARS EASILY!!    Allergies as of 01/18/2019      Reactions   Sulfa Antibiotics Shortness Of Breath, Palpitations   Sulfamethoxazole Shortness Of Breath,  Palpitations   Sulfur Shortness Of Breath, Palpitations   "Allergic," per MAR (although I believe they might've meant "Sulfa"??)   Amoxicillin Rash   Penicillin G Rash   Did it involve swelling of the face/tongue/throat, SOB, or low BP? Yes Did it involve sudden or severe rash/hives, skin peeling, or any reaction on the inside of your mouth or nose? Unk Did you need to seek medical attention at a hospital or doctor's office? Unk When did it last happen? "it was a long time ago" If all above answers are "NO", may proceed with cephalosporin use.   Penicillins Rash   Did it involve swelling of the face/tongue/throat, SOB, or low BP? Yes Did it involve sudden or severe rash/hives, skin peeling, or any reaction on the inside of your mouth or nose? Unk Did you need to seek medical attention at a hospital or doctor's office? Unk When did it last happen? "it was a long time ago" If all above answers are "NO", may proceed with cephalosporin use.   Tape Other (See Comments)   SKIN IS VERY FRAGILE- PLEASE USE AN ALTERNATIVE TO TAPE, AS THE SKIN TEARS EASILY!!      Medication List       Accurate as of January 18, 2019  1:16 PM. If you have any questions, ask your nurse or doctor.        acetaminophen 500 MG tablet Commonly known as: TYLENOL Take 500 mg by mouth 2 (two) times daily.   amiodarone 200 MG tablet Commonly known as: PACERONE Take 200 mg by mouth daily.   apixaban 2.5 MG Tabs tablet Commonly known as: ELIQUIS Take 1 tablet (2.5 mg total) by mouth 2 (two) times daily.   Demadex 20 MG tablet Generic drug: torsemide Take 20 mg by mouth daily.   diltiazem 120 MG 24 hr capsule Commonly known as: CARDIZEM CD Take 1 capsule (120 mg total) by mouth daily.   ergocalciferol 1.25 MG (50000 UT) capsule Commonly known as: VITAMIN D2 Take 1,250 Units by mouth once a week. Once a day on Sunday   fluticasone 50 MCG/ACT nasal spray Commonly known as: FLONASE Place 2 sprays into  both nostrils every morning.   Fluticasone-Salmeterol 250-50 MCG/DOSE Aepb Commonly known as: ADVAIR Inhale 1 puff into the lungs 2 (two) times a day.   ipratropium-albuterol 0.5-2.5 (3) MG/3ML Soln Commonly known as: DUONEB Take 3 mLs by nebulization every 6 (six) hours as needed (WHEEZING).   levothyroxine 100 MCG tablet Commonly known as: SYNTHROID Take 100 mcg by mouth daily before breakfast.   linaclotide 145 MCG Caps capsule Commonly known as: LINZESS Take 145 mcg by mouth every other day.   OXYGEN Inhale 2-4 L into the lungs continuous. SAT MUST BE >90%   pantoprazole 40 MG tablet Commonly known as: PROTONIX Take 40 mg by mouth daily.   potassium chloride 20 MEQ packet  Commonly known as: KLOR-CON Take 20 mEq by mouth daily.   Resource 2.0 Liqd Take by mouth. 120 mL TID B/W meals Three Times A Day Between Meals   sennosides-docusate sodium 8.6-50 MG tablet Commonly known as: SENOKOT-S Take 2 tablets by mouth daily.   tiotropium 18 MCG inhalation capsule Commonly known as: SPIRIVA Place 18 mcg into inhaler and inhale daily.   zinc oxide 20 % ointment Apply 1 application topically as needed for irritation. Apply to buttocks after every incontinent episode and as needed for redness       Review of Systems  Constitutional: Negative for activity change, appetite change, chills, diaphoresis, fatigue and fever.  HENT: Positive for hearing loss. Negative for congestion and voice change.   Eyes: Negative for visual disturbance.  Respiratory: Positive for cough and shortness of breath. Negative for wheezing.        Chronic cough, DOE, O2 dependent  Cardiovascular: Positive for chest pain and leg swelling. Negative for palpitations.       Right chest pain under the breast with deep breathing.   Gastrointestinal: Negative for abdominal distention, abdominal pain, constipation, diarrhea, nausea and vomiting.  Genitourinary: Negative for difficulty urinating, dysuria  and urgency.  Musculoskeletal: Positive for arthralgias and gait problem.  Skin: Positive for color change.       Dark pigmented skin changes BLE  Neurological: Negative for dizziness, speech difficulty, weakness and headaches.  Psychiatric/Behavioral: Negative for agitation, behavioral problems, hallucinations and sleep disturbance. The patient is not nervous/anxious.     Immunization History  Administered Date(s) Administered  . Influenza-Unspecified 11/15/2013  . PPD Test 07/24/2011  . Zoster Recombinat (Shingrix) 12/10/2017   Pertinent  Health Maintenance Due  Topic Date Due  . PNA vac Low Risk Adult (1 of 2 - PCV13) 10/02/1984  . INFLUENZA VACCINE  09/16/2018  . DEXA SCAN  Completed   Fall Risk  01/28/2014  Falls in the past year? Yes  Number falls in past yr: 1  Injury with Fall? No   Functional Status Survey:    Vitals:   01/18/19 1202  BP: 130/62  Pulse: 68  Resp: 18  Temp: 97.8 F (36.6 C)  SpO2: 98%   There is no height or weight on file to calculate BMI. Physical Exam Vitals signs and nursing note reviewed.  Constitutional:      General: She is not in acute distress.    Appearance: Normal appearance. She is not ill-appearing, toxic-appearing or diaphoretic.  HENT:     Head: Normocephalic and atraumatic.     Nose: Nose normal.     Mouth/Throat:     Mouth: Mucous membranes are moist.  Eyes:     Extraocular Movements: Extraocular movements intact.     Conjunctiva/sclera: Conjunctivae normal.     Pupils: Pupils are equal, round, and reactive to light.  Neck:     Musculoskeletal: Normal range of motion and neck supple.  Cardiovascular:     Rate and Rhythm: Normal rate and regular rhythm.     Heart sounds: No murmur.  Pulmonary:     Breath sounds: Rales present.     Comments: Bibasilar rales.  Abdominal:     General: Bowel sounds are normal. There is no distension.     Palpations: Abdomen is soft.     Tenderness: There is no abdominal tenderness.  There is no right CVA tenderness, left CVA tenderness, guarding or rebound.  Musculoskeletal:     Right lower leg: Edema present.  Left lower leg: Edema present.     Comments: Trace edema BLE  Skin:    General: Skin is warm and dry.     Comments: Dark pigmented chronic venous insufficiency skin changes BLE  Neurological:     General: No focal deficit present.     Mental Status: She is alert and oriented to person, place, and time. Mental status is at baseline.  Psychiatric:        Mood and Affect: Mood normal.        Behavior: Behavior normal.        Thought Content: Thought content normal.        Judgment: Judgment normal.     Labs reviewed: Recent Labs    03/17/18 1753  05/01/18 0545 05/02/18 0402  05/21/18 0815 05/22/18 4403 05/23/18 0326  06/08/18 06/29/18 08/18/18 10/05/18  NA  --    < > 134* 133*   < > 138 137 136   < > 137 135* 137 137  K  --    < > 3.9 4.4   < > 3.8 3.3* 3.0*   < > 4.8 4.4 4.0 4.4  CL  --    < > 95* 96*   < > 95* 93* 93*  --  98  --   --  95  CO2  --    < > 29 27   < > 33* 35* 33*  --  32  --   --  35  GLUCOSE  --    < > 140* 131*   < > 96 95 94  --   --   --   --   --   BUN  --    < > 34* 43*   < > 26* 26* 25*   < > 38* 45* 54* 51*  CREATININE  --    < > 1.39* 1.50*   < > 1.48* 1.32* 1.17*   < > 1.5* 1.9* 1.8* 1.6*  CALCIUM  --    < > 8.9 8.7*   < > 9.1 8.6* 8.5*  --  8.7  --   --  9.0  MG 1.7  --  2.0 1.9  --   --   --   --   --   --   --   --   --    < > = values in this interval not displayed.   Recent Labs    05/01/18 0545 05/02/18 0402 05/11/18 05/19/18 2000  AST 19 23 11* 19  ALT 19 24 12 19   ALKPHOS 62 56 56 79  BILITOT 0.5 0.6  --  0.7  PROT 6.3* 5.7*  --  6.3*  ALBUMIN 3.0* 2.9*  --  3.1*   Recent Labs    05/02/18 0402  05/19/18 2000 05/21/18 0815 05/29/18 10/05/18  WBC 9.2   < > 10.0 12.0*  --  7.1  NEUTROABS  --   --  7.9* 9.1*  --  4,771  HGB 9.9*   < > 11.2* 10.4* 9.6* 9.1*  HCT 30.2*   < > 35.8* 31.9* 29* 28*  MCV  93.8  --  94.7 94.4  --   --   PLT 348   < > 248 289 339 294   < > = values in this interval not displayed.   Lab Results  Component Value Date   TSH 2.84 07/06/2018   No results found for: HGBA1C No results found for: CHOL, HDL, LDLCALC,  LDLDIRECT, TRIG, CHOLHDL  Significant Diagnostic Results in last 30 days:  No results found.  Assessment/Plan Chest pain of uncertain etiology Anterior chest pain under the breast with deep breathing, no bruise or injury, duration about 2 weeks per patient. She is afebrile. Will obtain CXR ap/lateral to evaluate further.   COPD (chronic obstructive pulmonary disease) (HCC) Continue O2 via [place], continue Spiriva qd, prn DuoNeb, bid Advair.   Diastolic CHF (HCC) Chronic trace edema BLE, continue Torsemide 20mg  qd.   Atrial fibrillation (HCC) Heart rate is in control, continue Diltiazem 120mg  qd, Eliquis 2.5mg  bid.   GERD (gastroesophageal reflux disease) Stable, continue Protonix.   Chronic pain of right knee Continue Tylenol 500mg  bid.   Slow transit constipation Requested prn Senokot S II qd, will schedule daily. Observe.     Family/ staff Communication: plan of care reviewed with the patient and charge nurse.   Labs/tests ordered: CXR ap/lateral   Time spend 25 minutes .

## 2019-01-18 NOTE — Assessment & Plan Note (Signed)
Continue O2 via [place], continue Spiriva qd, prn DuoNeb, bid Advair.

## 2019-01-18 NOTE — Assessment & Plan Note (Signed)
Chronic trace edema BLE, continue Torsemide 20mg  qd.

## 2019-01-18 NOTE — Assessment & Plan Note (Signed)
Stable, continue Protonix 

## 2019-01-18 NOTE — Assessment & Plan Note (Signed)
Heart rate is in control, continue Diltiazem 120mg  qd, Eliquis 2.5mg  bid.

## 2019-01-18 NOTE — Assessment & Plan Note (Signed)
Continue Tylenol 500mg  bid.

## 2019-01-18 NOTE — Assessment & Plan Note (Signed)
Anterior chest pain under the breast with deep breathing, no bruise or injury, duration about 2 weeks per patient. She is afebrile. Will obtain CXR ap/lateral to evaluate further.

## 2019-01-18 NOTE — Assessment & Plan Note (Signed)
Requested prn Senokot S II qd, will schedule daily. Observe.

## 2019-01-19 DIAGNOSIS — R079 Chest pain, unspecified: Secondary | ICD-10-CM | POA: Diagnosis not present

## 2019-01-22 ENCOUNTER — Non-Acute Institutional Stay (SKILLED_NURSING_FACILITY): Payer: Medicare Other | Admitting: Internal Medicine

## 2019-01-22 ENCOUNTER — Encounter: Payer: Self-pay | Admitting: Internal Medicine

## 2019-01-22 DIAGNOSIS — J189 Pneumonia, unspecified organism: Secondary | ICD-10-CM | POA: Diagnosis not present

## 2019-01-22 DIAGNOSIS — R079 Chest pain, unspecified: Secondary | ICD-10-CM | POA: Diagnosis not present

## 2019-01-22 DIAGNOSIS — I4811 Longstanding persistent atrial fibrillation: Secondary | ICD-10-CM | POA: Diagnosis not present

## 2019-01-22 DIAGNOSIS — J449 Chronic obstructive pulmonary disease, unspecified: Secondary | ICD-10-CM

## 2019-01-22 DIAGNOSIS — C3491 Malignant neoplasm of unspecified part of right bronchus or lung: Secondary | ICD-10-CM | POA: Diagnosis not present

## 2019-01-22 NOTE — Progress Notes (Signed)
Location:    [facility] Home Room Number: 85 Place of Service:  SNF (31) Provider:  [name], MD  [name], MD  Patient Care Team: [name], MD as PCP - General (Internal Medicine) Debara Pickett Nadean Corwin, MD as PCP - Cardiology (Cardiology) Nicholas Lose, MD as Consulting Physician (Hematology and Oncology)  Extended Emergency Contact Information Primary Emergency Contact: Little Ishikawa Mobile Phone: [phone] Relation: Niece Secondary Emergency Contact: Cromer,Chad Mobile Phone: [phone] Relation: Nephew  Code Status:  DNR Goals of care: Advanced Directive information Advanced Directives 12/20/2018  Does Patient Have a Medical Advance Directive? Yes  Type of Paramedic of [place];Living will;Out of facility DNR (pink MOST or yellow form)  Does patient want to make changes to medical advance directive? No - Patient declined  Copy of [place] in Chart? -  Would patient like information on creating a medical advance directive? -  Pre-existing out of facility DNR order (yellow form or pink MOST form) -     Chief Complaint  Patient presents with  . Acute Visit    Follow up of pneumonia    HPI:  Pt is a 85 y.o. female seen today for an acute visit for Recent diagnosis of pneumonia Patient is a long-term resident of the facility   Patient has PMH ofTIA in 2010 s/p CEA in 2010, COPD on oxygen at night, Hypertension,GERD,Hypothyroidism, Gastritis and Paget's disease of Nipple  Atrial Fibrillation, CHF, bilateral pleural effusions requiring thoracocentesis and right upper lobe nodule suspicious of cancer  Patient had a CT scan done in March 20 which showed right upper lung field suspicious for cancer.  Patient at that time and send them for any further work-up. Few days ago she complained of right chest pain.  She had an x-ray done.  Which shows bilateral lower lobe infiltrate possible pneumonia.  It also shows  some haziness in the right middle lobe possible mass or infiltrate.  She was started on doxycycline.  Does not have any fever.  Continues to have shortness of breath and cough.  She still has the pain in her right side. Her weight is stable.  Her appetite is fair  Past Medical History:  Diagnosis Date  . Arthritis   . Constipation   . COPD (chronic obstructive pulmonary disease) ([place])   . Fatigue   . Frequent UTI   . GERD (gastroesophageal reflux disease)   . History of shingles 2007  . Hyperlipemia   . Hypertension   . Hypothyroidism   . Multiple allergies   . Osteoporosis   . Paget's carcinoma of the nipple ([place]) 03/16/2013  . Pneumonia   . Seasonal allergies   . Stroke [facility]) 2010   no residual  . Thyroid disease   . Uterine cancer [facility])    Past Surgical History:  Procedure Laterality Date  . ABDOMINAL HYSTERECTOMY    . BREAST LUMPECTOMY     left breast  . CAROTID ENDARTERECTOMY Left June 10, 2008   CE  . EYE SURGERY     cataracts removed bilaterally  . TONSILLECTOMY      Allergies  Allergen Reactions  . Sulfa Antibiotics Shortness Of Breath and Palpitations  . Sulfamethoxazole Shortness Of Breath and Palpitations  . Sulfur Shortness Of Breath and Palpitations    "Allergic," per MAR (although I believe they might've meant "Sulfa"??)  . Amoxicillin Rash  . Penicillin G Rash    Did it involve swelling of the face/tongue/throat, SOB, or  low BP? Yes Did it involve sudden or severe rash/hives, skin peeling, or any reaction on the inside of your mouth or nose? Unk Did you need to seek medical attention at a hospital or doctor's office? Unk When did it last happen? "it was a long time ago" If all above answers are "NO", may proceed with cephalosporin use.   Marland Kitchen Penicillins Rash    Did it involve swelling of the face/tongue/throat, SOB, or low BP? Yes Did it involve sudden or severe rash/hives, skin peeling, or any reaction on the inside of your mouth or nose? Unk Did  you need to seek medical attention at a hospital or doctor's office? Unk When did it last happen? "it was a long time ago" If all above answers are "NO", may proceed with cephalosporin use.   . Tape Other (See Comments)    SKIN IS VERY FRAGILE- PLEASE USE AN ALTERNATIVE TO TAPE, AS THE SKIN TEARS EASILY!!    Allergies as of 01/22/2019      Reactions   Sulfa Antibiotics Shortness Of Breath, Palpitations   Sulfamethoxazole Shortness Of Breath, Palpitations   Sulfur Shortness Of Breath, Palpitations   "Allergic," per MAR (although I believe they might've meant "Sulfa"??)   Amoxicillin Rash   Penicillin G Rash   Did it involve swelling of the face/tongue/throat, SOB, or low BP? Yes Did it involve sudden or severe rash/hives, skin peeling, or any reaction on the inside of your mouth or nose? Unk Did you need to seek medical attention at a hospital or doctor's office? Unk When did it last happen? "it was a long time ago" If all above answers are "NO", may proceed with cephalosporin use.   Penicillins Rash   Did it involve swelling of the face/tongue/throat, SOB, or low BP? Yes Did it involve sudden or severe rash/hives, skin peeling, or any reaction on the inside of your mouth or nose? Unk Did you need to seek medical attention at a hospital or doctor's office? Unk When did it last happen? "it was a long time ago" If all above answers are "NO", may proceed with cephalosporin use.   Tape Other (See Comments)   SKIN IS VERY FRAGILE- PLEASE USE AN ALTERNATIVE TO TAPE, AS THE SKIN TEARS EASILY!!      Medication List       Accurate as of January 22, 2019 10:26 AM. If you have any questions, ask your nurse or doctor.        acetaminophen 500 MG tablet Commonly known as: TYLENOL Take 500 mg by mouth 2 (two) times daily.   amiodarone 200 MG tablet Commonly known as: PACERONE Take 200 mg by mouth daily.   apixaban 2.5 MG Tabs tablet Commonly known as: ELIQUIS Take 1 tablet (2.5 mg  total) by mouth 2 (two) times daily.   Demadex 20 MG tablet Generic drug: torsemide Take 20 mg by mouth daily.   diltiazem 120 MG 24 hr capsule Commonly known as: CARDIZEM CD Take 1 capsule (120 mg total) by mouth daily.   doxycycline 100 MG tablet Commonly known as: VIBRA-TABS Take 100 mg by mouth 2 (two) times daily.   ergocalciferol 1.25 MG (50000 UT) capsule Commonly known as: VITAMIN D2 Take 1,250 Units by mouth once a week. Once a day on Sunday   fluticasone 50 MCG/ACT nasal spray Commonly known as: FLONASE Place 2 sprays into both nostrils every morning.   Fluticasone-Salmeterol 250-50 MCG/DOSE Aepb Commonly known as: ADVAIR Inhale 1 puff into the lungs  2 (two) times a day.   ipratropium-albuterol 0.5-2.5 (3) MG/3ML Soln Commonly known as: DUONEB Take 3 mLs by nebulization every 6 (six) hours as needed (WHEEZING).   levothyroxine 100 MCG tablet Commonly known as: SYNTHROID Take 100 mcg by mouth daily before breakfast.   linaclotide 145 MCG Caps capsule Commonly known as: LINZESS Take 145 mcg by mouth every other day.   OXYGEN Inhale 2-4 L into the lungs continuous. SAT MUST BE >90%   pantoprazole 40 MG tablet Commonly known as: PROTONIX Take 40 mg by mouth daily.   potassium chloride 20 MEQ packet Commonly known as: KLOR-CON Take 20 mEq by mouth daily.   Resource 2.0 Liqd Take by mouth. 120 mL TID B/W meals Three Times A Day Between Meals   saccharomyces boulardii 250 MG capsule Commonly known as: FLORASTOR Take 250 mg by mouth 2 (two) times daily.   sennosides-docusate sodium 8.6-50 MG tablet Commonly known as: SENOKOT-S Take 2 tablets by mouth daily.   sodium chloride 0.65 % Soln nasal spray Commonly known as: OCEAN Place 1 spray into both nostrils. Apply to RT and LT Nostril Twice A Day   tiotropium 18 MCG inhalation capsule Commonly known as: SPIRIVA Place 18 mcg into inhaler and inhale daily.   zinc oxide 20 % ointment Apply 1  application topically as needed for irritation. Apply to buttocks after every incontinent episode and as needed for redness       Review of Systems  Constitutional: Negative.   HENT: Negative.   Respiratory: Positive for cough and shortness of breath.   Cardiovascular: Positive for chest pain.  Gastrointestinal: Negative.   Genitourinary: Negative.   Musculoskeletal: Negative.   Neurological: Negative.   Psychiatric/Behavioral: Negative.   All other systems reviewed and are negative.   Immunization History  Administered Date(s) Administered  . Influenza-Unspecified 11/15/2013  . PPD Test 07/24/2011  . Zoster Recombinat (Shingrix) 12/10/2017   Pertinent  Health Maintenance Due  Topic Date Due  . PNA vac Low Risk Adult (1 of 2 - PCV13) 10/02/1984  . INFLUENZA VACCINE  09/16/2018  . DEXA SCAN  Completed   Fall Risk  01/28/2014  Falls in the past year? Yes  Number falls in past yr: 1  Injury with Fall? No   Functional Status Survey:    Vitals:   01/22/19 1017  BP: 124/68  Pulse: (!) 58  Resp: 20  Temp: (!) 97.3 F (36.3 C)  SpO2: 96%  Weight: 98 lb 3.2 oz (44.5 kg)  Height: 4\' 10"  (1.473 m)   Body mass index is 20.52 kg/m. Physical Exam Vitals signs reviewed.  Constitutional:      Appearance: Normal appearance.  HENT:     Head: Normocephalic.     Nose: Nose normal.     Mouth/Throat:     Mouth: Mucous membranes are moist.     Pharynx: Oropharynx is clear.  Eyes:     Pupils: Pupils are equal, round, and reactive to light.  Neck:     Musculoskeletal: Neck supple.  Cardiovascular:     Rate and Rhythm: Normal rate and regular rhythm.     Pulses: Normal pulses.  Pulmonary:     Effort: Pulmonary effort is normal. No respiratory distress.     Breath sounds: Normal breath sounds. No wheezing or rales.  Abdominal:     General: Abdomen is flat. Bowel sounds are normal.     Palpations: Abdomen is soft.  Musculoskeletal:        General: No  swelling.  Skin:     General: Skin is warm.  Neurological:     General: No focal deficit present.     Mental Status: She is alert.  Psychiatric:        Mood and Affect: Mood normal.        Thought Content: Thought content normal.        Judgment: Judgment normal.     Labs reviewed: Recent Labs    03/17/18 1753  05/01/18 0545 05/02/18 0402  05/21/18 0815 05/22/18 8850 05/23/18 0326  06/08/18 06/29/18 08/18/18 10/05/18  NA  --    < > 134* 133*   < > 138 137 136   < > 137 135* 137 137  K  --    < > 3.9 4.4   < > 3.8 3.3* 3.0*   < > 4.8 4.4 4.0 4.4  CL  --    < > 95* 96*   < > 95* 93* 93*  --  98  --   --  95  CO2  --    < > 29 27   < > 33* 35* 33*  --  32  --   --  35  GLUCOSE  --    < > 140* 131*   < > 96 95 94  --   --   --   --   --   BUN  --    < > 34* 43*   < > 26* 26* 25*   < > 38* 45* 54* 51*  CREATININE  --    < > 1.39* 1.50*   < > 1.48* 1.32* 1.17*   < > 1.5* 1.9* 1.8* 1.6*  CALCIUM  --    < > 8.9 8.7*   < > 9.1 8.6* 8.5*  --  8.7  --   --  9.0  MG 1.7  --  2.0 1.9  --   --   --   --   --   --   --   --   --    < > = values in this interval not displayed.   Recent Labs    05/01/18 0545 05/02/18 0402 05/11/18 05/19/18 2000  AST 19 23 11* 19  ALT 19 24 12 19   ALKPHOS 62 56 56 79  BILITOT 0.5 0.6  --  0.7  PROT 6.3* 5.7*  --  6.3*  ALBUMIN 3.0* 2.9*  --  3.1*   Recent Labs    05/02/18 0402  05/19/18 2000 05/21/18 0815 05/29/18 10/05/18  WBC 9.2   < > 10.0 12.0*  --  7.1  NEUTROABS  --   --  7.9* 9.1*  --  4,771  HGB 9.9*   < > 11.2* 10.4* 9.6* 9.1*  HCT 30.2*   < > 35.8* 31.9* 29* 28*  MCV 93.8  --  94.7 94.4  --   --   PLT 348   < > 248 289 339 294   < > = values in this interval not displayed.   Lab Results  Component Value Date   TSH 2.84 07/06/2018   No results found for: HGBA1C No results found for: CHOL, HDL, LDLCALC, LDLDIRECT, TRIG, CHOLHDL  Significant Diagnostic Results in last 30 days:  No results found.  Assessment/Plan  Pneumonia of both lower lobes   Started on Doxycyline for 7 days   Bronchogenic carcinoma of right lung [facility]) She has Right Upper lobe Nodule on CT scan in  3/20 Xray this time shows ? Poorly defined Mass like density in Right Middle Lobe I am worried that it is  possible bronchogenic carcinoma.  I discussed in detail with her and her niece on the phone.  We can go ahead and get a CT scan of the chest.  We can also treat her for pneumonia and repeat x-ray in 3 to 4 weeks. They are going to talk to other family members and let me know what direction they want to go   I have told them that there is not much which can be done at her age.  Patient is not interested in any aggressive measures at this time  Chest pain of uncertain etiology Pain is little bit better but still there We will continue Tylenol as needed  Longstanding persistent atrial fibrillation (HCC) On amiodarone and Eliquis also on Cardizem Last TSH was normal in 5/20  COPD Continue oxygen and Advair and Spiriva Chronic diastolic congestive heart failure (HCC) On Demadex BUN and creat are slightly high Will continue to follow  Anemia Hgb Is low but stable Constipation  on Linzess 170mcg QOD Family/ staff Communication:   Labs/tests ordered:  CBC,TSH,BMP  Total time spent in this patient care encounter was  45_  minutes; greater than 50% of the visit spent counseling patient and staff, reviewing records , Labs and coordinating care for problems addressed at this encounter.

## 2019-01-30 DIAGNOSIS — Z20828 Contact with and (suspected) exposure to other viral communicable diseases: Secondary | ICD-10-CM | POA: Diagnosis not present

## 2019-02-01 ENCOUNTER — Non-Acute Institutional Stay (SKILLED_NURSING_FACILITY): Payer: Medicare Other | Admitting: Nurse Practitioner

## 2019-02-01 ENCOUNTER — Encounter: Payer: Self-pay | Admitting: Nurse Practitioner

## 2019-02-01 DIAGNOSIS — K219 Gastro-esophageal reflux disease without esophagitis: Secondary | ICD-10-CM

## 2019-02-01 DIAGNOSIS — I5032 Chronic diastolic (congestive) heart failure: Secondary | ICD-10-CM | POA: Diagnosis not present

## 2019-02-01 DIAGNOSIS — J449 Chronic obstructive pulmonary disease, unspecified: Secondary | ICD-10-CM | POA: Diagnosis not present

## 2019-02-01 DIAGNOSIS — K5901 Slow transit constipation: Secondary | ICD-10-CM

## 2019-02-01 DIAGNOSIS — R911 Solitary pulmonary nodule: Secondary | ICD-10-CM

## 2019-02-01 DIAGNOSIS — I4811 Longstanding persistent atrial fibrillation: Secondary | ICD-10-CM

## 2019-02-01 DIAGNOSIS — M159 Polyosteoarthritis, unspecified: Secondary | ICD-10-CM

## 2019-02-01 DIAGNOSIS — E032 Hypothyroidism due to medicaments and other exogenous substances: Secondary | ICD-10-CM

## 2019-02-01 NOTE — Assessment & Plan Note (Signed)
Stable, trace edema in ankles, continue Torsemide 20mg  qd.

## 2019-02-01 NOTE — Assessment & Plan Note (Signed)
stable, continue  Senokot S II qd, Linzess qod.

## 2019-02-01 NOTE — Assessment & Plan Note (Signed)
heart rate is in control, continue  Amiodarone 200mg  qd, Diltiazem 120mg  qd, Eliquis 2.5mg  bid.

## 2019-02-01 NOTE — Assessment & Plan Note (Signed)
Stable, continue Protonix 

## 2019-02-01 NOTE — Assessment & Plan Note (Signed)
R knee, pelvis floor, Stable, continue Tylenol

## 2019-02-01 NOTE — Assessment & Plan Note (Signed)
stable, continue Spiriva qd, O2 4lpm via [place], prn DuoNeb q6h, Advair bid.

## 2019-02-01 NOTE — Progress Notes (Signed)
Location:   [facility] Room Number: 40 Place of Service:  SNF (31) Provider: Dallana Mavity NP  [name], MD  Patient Care Team: [name], MD as PCP - General (Internal Medicine) Debara Pickett Nadean Corwin, MD as PCP - Cardiology (Cardiology) Nicholas Lose, MD as Consulting Physician (Hematology and Oncology)  Extended Emergency Contact Information Primary Emergency Contact: Little Ishikawa Mobile Phone: [phone] Relation: Niece Secondary Emergency Contact: Cromer,Chad Mobile Phone: [phone] Relation: Nephew  Code Status:  DNR Goals of care: Advanced Directive information Advanced Directives 12/20/2018  Does Patient Have a Medical Advance Directive? Yes  Type of Paramedic of [place];Living will;Out of facility DNR (pink MOST or yellow form)  Does patient want to make changes to medical advance directive? No - Patient declined  Copy of [place] in Chart? -  Would patient like information on creating a medical advance directive? -  Pre-existing out of facility DNR order (yellow form or pink MOST form) -     Chief Complaint  Patient presents with   Medical Management of Chronic Issues   Health Maintenance    TDAP, PCV13    HPI:  Pt is a 85 y.o. female seen today for medical management of chronic diseases.    The patient resides in SN[facility] for safety, care assistance. Hx of CHF, on Torsemide 20mg  qd. Afib, heart rate is in control, on Amiodarone 200mg  qd, Diltiazem 120mg  qd, Eliquis 2.5mg  bid. COPD, stable, on Spiriva qd, O2, prn DuoNeb q6h, Advair bid. OA, multiple sites, stable, on Tylenol 500mg  bid. Constipation, stable, on Senokot S II qd, Linzess qod. GERD, stable, on Protonix 40mg  qd. Hypothyroidism, on Synthroid 149mcg qd.   PNA both lower lobes: resolved, completed 7 day course of Doxycycline  R upper lobe nodule, declined CT chest.      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   Pneumonia    Seasonal allergies    Stroke ([place]) 2010   no residual   Thyroid disease    Uterine cancer [facility])    Past Surgical History:  Procedure Laterality Date   ABDOMINAL HYSTERECTOMY     BREAST LUMPECTOMY     left breast   CAROTID ENDARTERECTOMY Left June 10, 2008   CE   EYE SURGERY     cataracts removed bilaterally   TONSILLECTOMY      Allergies  Allergen Reactions   Sulfa Antibiotics Shortness Of Breath and Palpitations   Sulfamethoxazole Shortness Of Breath and Palpitations   Sulfur Shortness Of Breath and Palpitations    "Allergic," per MAR (although I believe they might've meant "Sulfa"??)   Amoxicillin Rash   Penicillin G Rash    Did it involve swelling of the face/tongue/throat, SOB, or low BP? Yes Did it involve sudden or severe rash/hives, skin peeling, or any reaction on the inside of your mouth or nose? Unk Did you need to seek medical attention at a hospital or doctor's office? Unk When did it last happen? "it was a long time ago" If all above answers are NO, may proceed  with cephalosporin use.    Penicillins Rash    Did it involve swelling of the face/tongue/throat, SOB, or low BP? Yes Did it involve sudden or severe rash/hives, skin peeling, or any reaction on the inside of your mouth or nose? Unk Did you need to seek medical attention at a hospital or doctor's office? Unk When did it last happen? "it was a long time ago" If all above answers are NO, may proceed with cephalosporin use.    Tape Other (See Comments)    SKIN IS VERY FRAGILE- PLEASE USE AN ALTERNATIVE TO TAPE, AS THE SKIN TEARS EASILY!!     Allergies as of 02/01/2019      Reactions   Sulfa Antibiotics Shortness Of Breath, Palpitations   Sulfamethoxazole Shortness Of Breath, Palpitations   Sulfur Shortness Of Breath, Palpitations   "Allergic," per MAR (although I believe they might've meant "Sulfa"??)   Amoxicillin Rash   Penicillin G Rash   Did it involve swelling of the face/tongue/throat, SOB, or low BP? Yes Did it involve sudden or severe rash/hives, skin peeling, or any reaction on the inside of your mouth or nose? Unk Did you need to seek medical attention at a hospital or doctor's office? Unk When did it last happen? "it was a long time ago" If all above answers are NO, may proceed with cephalosporin use.   Penicillins Rash   Did it involve swelling of the face/tongue/throat, SOB, or low BP? Yes Did it involve sudden or severe rash/hives, skin peeling, or any reaction on the inside of your mouth or nose? Unk Did you need to seek medical attention at a hospital or doctor's office? Unk When did it last happen? "it was a long time ago" If all above answers are NO, may proceed with cephalosporin use.   Tape Other (See Comments)   SKIN IS VERY FRAGILE- PLEASE USE AN ALTERNATIVE TO TAPE, AS THE SKIN TEARS EASILY!!      Medication List       Accurate as of February 01, 2019 11:55 AM. If you have any questions, ask your nurse or doctor.        STOP taking these medications   doxycycline 100 MG tablet Commonly known as: VIBRA-TABS Stopped by: Odesser Tourangeau X Teesha Ohm, NP     TAKE these medications   acetaminophen 500 MG tablet Commonly known as: TYLENOL Take 500 mg by mouth 2 (two) times daily.   amiodarone 200 MG tablet Commonly known as: PACERONE Take 200 mg by mouth daily.   apixaban 2.5 MG Tabs tablet Commonly known as: ELIQUIS Take 1 tablet (2.5 mg total) by mouth 2 (two) times daily.   Demadex 20 MG tablet Generic drug: torsemide Take 20 mg by mouth daily.   diltiazem 120 MG 24 hr capsule Commonly  known as: CARDIZEM CD Take 1 capsule (120 mg total) by mouth daily.   ergocalciferol 1.25 MG (50000 UT) capsule Commonly known as: VITAMIN D2 Take 1,250 Units by mouth once a week. Once a day on Sunday   fluticasone 50 MCG/ACT nasal spray Commonly known as: FLONASE Place 2 sprays into both nostrils every morning.   Fluticasone-Salmeterol 250-50 MCG/DOSE Aepb Commonly known as: ADVAIR Inhale 1 puff into the lungs 2 (two) times a day.   ipratropium-albuterol 0.5-2.5 (3) MG/3ML Soln Commonly known as: DUONEB Take 3 mLs by nebulization every 6 (six) hours as needed (WHEEZING).   levothyroxine 100 MCG tablet Commonly known as: SYNTHROID Take 100 mcg by mouth daily before breakfast.  linaclotide 145 MCG Caps capsule Commonly known as: LINZESS Take 145 mcg by mouth every other day.   OXYGEN Inhale 2-4 L into the lungs continuous. SAT MUST BE >90%   pantoprazole 40 MG tablet Commonly known as: PROTONIX Take 40 mg by mouth daily.   potassium chloride 20 MEQ packet Commonly known as: KLOR-CON Take 20 mEq by mouth daily.   Resource 2.0 Liqd Take by mouth. 120 mL TID B/W meals Three Times A Day Between Meals   saccharomyces boulardii 250 MG capsule Commonly known as: FLORASTOR Take 250 mg by mouth 2 (two) times daily.   sennosides-docusate sodium 8.6-50 MG tablet Commonly known as: SENOKOT-S Take 2 tablets by mouth daily.   sodium chloride 0.65 % Soln nasal spray Commonly known as: OCEAN Place 1 spray into both nostrils. Apply to RT and LT Nostril Twice A Day   tiotropium 18 MCG inhalation capsule Commonly known as: SPIRIVA Place 18 mcg into inhaler and inhale daily.   zinc oxide 20 % ointment Apply 1 application topically as needed for irritation. Apply to buttocks after every incontinent episode and as needed for redness       Review of Systems  Constitutional: Negative for activity change, appetite change, chills, diaphoresis, fatigue, fever and unexpected  weight change.  HENT: Positive for hearing loss and voice change. Negative for congestion.   Eyes: Negative for visual disturbance.  Respiratory: Positive for shortness of breath. Negative for cough and wheezing.        DOE  Cardiovascular: Positive for leg swelling. Negative for chest pain and palpitations.  Gastrointestinal: Negative for abdominal distention, abdominal pain, constipation, diarrhea, nausea and vomiting.  Genitourinary: Negative for difficulty urinating, dysuria and urgency.  Musculoskeletal: Positive for arthralgias and gait problem.  Skin: Negative for color change and pallor.  Neurological: Negative for dizziness, speech difficulty, weakness and headaches.  Psychiatric/Behavioral: Negative for agitation, behavioral problems, hallucinations and sleep disturbance. The patient is not nervous/anxious.     Immunization History  Administered Date(s) Administered   Influenza, High Dose Seasonal PF 11/30/2018   Influenza-Unspecified 11/15/2013   PPD Test 07/24/2011   Zoster Recombinat (Shingrix) 12/10/2017   Pertinent  Health Maintenance Due  Topic Date Due   PNA vac Low Risk Adult (1 of 2 - PCV13) 10/02/1984   INFLUENZA VACCINE  Completed   DEXA SCAN  Completed   Fall Risk  01/28/2014  Falls in the past year? Yes  Number falls in past yr: 1  Injury with Fall? No   Functional Status Survey:    Vitals:   02/01/19 1033  BP: 128/78  Pulse: 60  Resp: 20  Temp: (!) 97.5 F (36.4 C)  SpO2: 98%  Weight: 96 lb 11.2 oz (43.9 kg)  Height: 4\' 10"  (1.473 m)   Body mass index is 20.21 kg/m. Physical Exam Vitals and nursing note reviewed.  Constitutional:      General: She is not in acute distress.    Appearance: Normal appearance. She is not ill-appearing, toxic-appearing or diaphoretic.  HENT:     Head: Normocephalic and atraumatic.     Nose: Nose normal.     Mouth/Throat:     Mouth: Mucous membranes are moist.  Eyes:     Extraocular Movements:  Extraocular movements intact.     Conjunctiva/sclera: Conjunctivae normal.     Pupils: Pupils are equal, round, and reactive to light.  Cardiovascular:     Rate and Rhythm: Normal rate and regular rhythm.     Heart sounds:  No murmur.  Pulmonary:     Breath sounds: No wheezing, rhonchi or rales.     Comments: O2 4lpm via [place] Abdominal:     General: Bowel sounds are normal. There is no distension.     Palpations: Abdomen is soft.     Tenderness: There is no abdominal tenderness. There is no right CVA tenderness, left CVA tenderness, guarding or rebound.  Musculoskeletal:     Cervical back: Normal range of motion and neck supple.     Right lower leg: Edema present.     Left lower leg: Edema present.     Comments: Trace edema in ankles.   Skin:    General: Skin is warm and dry.  Neurological:     General: No focal deficit present.     Mental Status: She is alert and oriented to person, place, and time. Mental status is at baseline.     Motor: No weakness.     Coordination: Coordination normal.     Gait: Gait abnormal.  Psychiatric:        Mood and Affect: Mood normal.        Behavior: Behavior normal.        Thought Content: Thought content normal.        Judgment: Judgment normal.     Labs reviewed: Recent Labs    03/17/18 1753 03/18/18 0505 05/01/18 0545 05/01/18 0545 05/02/18 0402 05/11/18 0000 05/21/18 0815 05/22/18 2229 05/22/18 7989 05/23/18 0326 06/08/18 0000 06/29/18 0000 08/18/18 0000 10/05/18 0000  NA  --    < > 134*   < > 133*   < > 138 137   < > 136 137 135* 137 137  K  --   --  3.9  --  4.4  --  3.8 3.3*  --  3.0* 4.8 4.4 4.0 4.4  CL  --    < > 95*  --  96*   < > 95* 93*  --  93* 98  --   --  95  CO2  --    < > 29  --  27   < > 33* 35*  --  33* 32  --   --  35  GLUCOSE  --   --  140*  --  131*   < > 96 95  --  94  --   --   --   --   BUN  --    < > 34*   < > 43*   < > 26* 26*   < > 25* 38* 45* 54* 51*  CREATININE  --    < > 1.39*   < > 1.50*   < >  1.48* 1.32*   < > 1.17* 1.5* 1.9* 1.8* 1.6*  CALCIUM  --    < > 8.9  --  8.7*   < > 9.1 8.6*  --  8.5* 8.7  --   --  9.0  MG 1.7  --  2.0  --  1.9  --   --   --   --   --   --   --   --   --    < > = values in this interval not displayed.   Recent Labs    05/01/18 0545 05/02/18 0402 05/11/18 0000 05/19/18 2000  AST 19 23 11* 19  ALT 19 24 12 19   ALKPHOS 62 56 56 79  BILITOT 0.5 0.6  --  0.7  PROT  6.3* 5.7*  --  6.3*  ALBUMIN 3.0* 2.9*  --  3.1*   Recent Labs    05/02/18 0402 05/19/18 2000 05/21/18 0815 05/29/18 0000 10/05/18 0000  WBC 9.2 10.0 12.0*  --  7.1  NEUTROABS  --  7.9* 9.1*  --  4,771  HGB 9.9* 11.2* 10.4* 9.6* 9.1*  HCT 30.2* 35.8* 31.9* 29* 28*  MCV 93.8 94.7 94.4  --   --   PLT 348 248 289 339 294   Lab Results  Component Value Date   TSH 2.84 07/06/2018   No results found for: HGBA1C No results found for: CHOL, HDL, LDLCALC, LDLDIRECT, TRIG, CHOLHDL  Significant Diagnostic Results in last 30 days:  No results found.  Assessment/Plan Nodule of upper lobe of right lung The patient declined CT chest.   Atrial fibrillation (HCC) heart rate is in control, continue  Amiodarone 200mg  qd, Diltiazem 120mg  qd, Eliquis 2.5mg  bid.  Diastolic CHF (HCC) Stable, trace edema in ankles, continue Torsemide 20mg  qd.   COPD (chronic obstructive pulmonary disease) (HCC) stable, continue Spiriva qd, O2 4lpm via [place], prn DuoNeb q6h, Advair bid.  GERD (gastroesophageal reflux disease) Stable, continue Protonix.   Slow transit constipation  stable, continue  Senokot S II qd, Linzess qod.  Hypothyroidism Stable, continue Levothyroxine 124mcg qd, TSH 2.84 07/06/18  Generalized osteoarthritis of multiple sites R knee, pelvis floor, Stable, continue Tylenol      Family/ staff Communication: plan of care reviewed with the patient and charge nurse.   Labs/tests ordered:  none  Time spend 25 minutes.

## 2019-02-01 NOTE — Assessment & Plan Note (Signed)
Stable, continue Levothyroxine 122mcg qd, TSH 2.84 07/06/18

## 2019-02-01 NOTE — Assessment & Plan Note (Signed)
The patient declined CT chest.

## 2019-02-05 DIAGNOSIS — Z20828 Contact with and (suspected) exposure to other viral communicable diseases: Secondary | ICD-10-CM | POA: Diagnosis not present

## 2019-02-13 ENCOUNTER — Inpatient Hospital Stay (HOSPITAL_COMMUNITY)
Admission: EM | Admit: 2019-02-13 | Discharge: 2019-02-16 | DRG: 291 | Disposition: A | Discharge status: Skilled Nursing Facility | Payer: Medicare Other | Attending: Internal Medicine | Admitting: Internal Medicine

## 2019-02-13 ENCOUNTER — Encounter: Payer: Self-pay | Admitting: Nurse Practitioner

## 2019-02-13 ENCOUNTER — Emergency Department (HOSPITAL_COMMUNITY): Payer: Medicare Other

## 2019-02-13 ENCOUNTER — Non-Acute Institutional Stay (SKILLED_NURSING_FACILITY): Payer: Medicare Other | Admitting: Nurse Practitioner

## 2019-02-13 ENCOUNTER — Inpatient Hospital Stay (HOSPITAL_COMMUNITY): Payer: Medicare Other

## 2019-02-13 DIAGNOSIS — Z87440 Personal history of urinary (tract) infections: Secondary | ICD-10-CM

## 2019-02-13 DIAGNOSIS — I5032 Chronic diastolic (congestive) heart failure: Secondary | ICD-10-CM | POA: Diagnosis not present

## 2019-02-13 DIAGNOSIS — K219 Gastro-esophageal reflux disease without esophagitis: Secondary | ICD-10-CM | POA: Diagnosis present

## 2019-02-13 DIAGNOSIS — J9620 Acute and chronic respiratory failure, unspecified whether with hypoxia or hypercapnia: Secondary | ICD-10-CM | POA: Diagnosis present

## 2019-02-13 DIAGNOSIS — I4891 Unspecified atrial fibrillation: Secondary | ICD-10-CM | POA: Diagnosis present

## 2019-02-13 DIAGNOSIS — J440 Chronic obstructive pulmonary disease with acute lower respiratory infection: Secondary | ICD-10-CM | POA: Diagnosis present

## 2019-02-13 DIAGNOSIS — J302 Other seasonal allergic rhinitis: Secondary | ICD-10-CM | POA: Diagnosis present

## 2019-02-13 DIAGNOSIS — J9601 Acute respiratory failure with hypoxia: Secondary | ICD-10-CM | POA: Diagnosis not present

## 2019-02-13 DIAGNOSIS — J449 Chronic obstructive pulmonary disease, unspecified: Secondary | ICD-10-CM | POA: Diagnosis not present

## 2019-02-13 DIAGNOSIS — Z853 Personal history of malignant neoplasm of breast: Secondary | ICD-10-CM

## 2019-02-13 DIAGNOSIS — Z881 Allergy status to other antibiotic agents status: Secondary | ICD-10-CM

## 2019-02-13 DIAGNOSIS — Z90710 Acquired absence of both cervix and uterus: Secondary | ICD-10-CM

## 2019-02-13 DIAGNOSIS — I5033 Acute on chronic diastolic (congestive) heart failure: Secondary | ICD-10-CM | POA: Diagnosis present

## 2019-02-13 DIAGNOSIS — Z66 Do not resuscitate: Secondary | ICD-10-CM | POA: Diagnosis not present

## 2019-02-13 DIAGNOSIS — Z8673 Personal history of transient ischemic attack (TIA), and cerebral infarction without residual deficits: Secondary | ICD-10-CM

## 2019-02-13 DIAGNOSIS — I4811 Longstanding persistent atrial fibrillation: Secondary | ICD-10-CM | POA: Diagnosis not present

## 2019-02-13 DIAGNOSIS — Z23 Encounter for immunization: Secondary | ICD-10-CM | POA: Diagnosis not present

## 2019-02-13 DIAGNOSIS — N184 Chronic kidney disease, stage 4 (severe): Secondary | ICD-10-CM | POA: Diagnosis present

## 2019-02-13 DIAGNOSIS — I130 Hypertensive heart and chronic kidney disease with heart failure and stage 1 through stage 4 chronic kidney disease, or unspecified chronic kidney disease: Secondary | ICD-10-CM | POA: Diagnosis present

## 2019-02-13 DIAGNOSIS — M2550 Pain in unspecified joint: Secondary | ICD-10-CM | POA: Diagnosis not present

## 2019-02-13 DIAGNOSIS — R911 Solitary pulmonary nodule: Secondary | ICD-10-CM | POA: Diagnosis present

## 2019-02-13 DIAGNOSIS — J441 Chronic obstructive pulmonary disease with (acute) exacerbation: Secondary | ICD-10-CM | POA: Diagnosis present

## 2019-02-13 DIAGNOSIS — R000 Tachycardia, unspecified: Secondary | ICD-10-CM | POA: Diagnosis not present

## 2019-02-13 DIAGNOSIS — N179 Acute kidney failure, unspecified: Secondary | ICD-10-CM | POA: Diagnosis present

## 2019-02-13 DIAGNOSIS — Z209 Contact with and (suspected) exposure to unspecified communicable disease: Secondary | ICD-10-CM | POA: Diagnosis not present

## 2019-02-13 DIAGNOSIS — Z20822 Contact with and (suspected) exposure to covid-19: Secondary | ICD-10-CM | POA: Diagnosis present

## 2019-02-13 DIAGNOSIS — J9621 Acute and chronic respiratory failure with hypoxia: Secondary | ICD-10-CM

## 2019-02-13 DIAGNOSIS — J9611 Chronic respiratory failure with hypoxia: Secondary | ICD-10-CM | POA: Diagnosis present

## 2019-02-13 DIAGNOSIS — E039 Hypothyroidism, unspecified: Secondary | ICD-10-CM | POA: Diagnosis present

## 2019-02-13 DIAGNOSIS — E785 Hyperlipidemia, unspecified: Secondary | ICD-10-CM | POA: Diagnosis present

## 2019-02-13 DIAGNOSIS — J189 Pneumonia, unspecified organism: Secondary | ICD-10-CM | POA: Diagnosis not present

## 2019-02-13 DIAGNOSIS — Z7401 Bed confinement status: Secondary | ICD-10-CM | POA: Diagnosis not present

## 2019-02-13 DIAGNOSIS — I959 Hypotension, unspecified: Secondary | ICD-10-CM | POA: Diagnosis present

## 2019-02-13 DIAGNOSIS — J9602 Acute respiratory failure with hypercapnia: Secondary | ICD-10-CM

## 2019-02-13 DIAGNOSIS — N189 Chronic kidney disease, unspecified: Secondary | ICD-10-CM

## 2019-02-13 DIAGNOSIS — Z20828 Contact with and (suspected) exposure to other viral communicable diseases: Secondary | ICD-10-CM | POA: Diagnosis not present

## 2019-02-13 DIAGNOSIS — Z9981 Dependence on supplemental oxygen: Secondary | ICD-10-CM

## 2019-02-13 DIAGNOSIS — Z79899 Other long term (current) drug therapy: Secondary | ICD-10-CM

## 2019-02-13 DIAGNOSIS — Z87891 Personal history of nicotine dependence: Secondary | ICD-10-CM

## 2019-02-13 DIAGNOSIS — Z79890 Hormone replacement therapy (postmenopausal): Secondary | ICD-10-CM

## 2019-02-13 DIAGNOSIS — Z882 Allergy status to sulfonamides status: Secondary | ICD-10-CM

## 2019-02-13 DIAGNOSIS — Z8542 Personal history of malignant neoplasm of other parts of uterus: Secondary | ICD-10-CM

## 2019-02-13 DIAGNOSIS — E871 Hypo-osmolality and hyponatremia: Secondary | ICD-10-CM | POA: Diagnosis present

## 2019-02-13 DIAGNOSIS — I509 Heart failure, unspecified: Secondary | ICD-10-CM

## 2019-02-13 DIAGNOSIS — Z7189 Other specified counseling: Secondary | ICD-10-CM | POA: Diagnosis not present

## 2019-02-13 DIAGNOSIS — Z993 Dependence on wheelchair: Secondary | ICD-10-CM

## 2019-02-13 DIAGNOSIS — R0602 Shortness of breath: Secondary | ICD-10-CM | POA: Diagnosis not present

## 2019-02-13 DIAGNOSIS — Z515 Encounter for palliative care: Secondary | ICD-10-CM | POA: Diagnosis not present

## 2019-02-13 DIAGNOSIS — R29898 Other symptoms and signs involving the musculoskeletal system: Secondary | ICD-10-CM | POA: Diagnosis not present

## 2019-02-13 DIAGNOSIS — Z91048 Other nonmedicinal substance allergy status: Secondary | ICD-10-CM

## 2019-02-13 DIAGNOSIS — M810 Age-related osteoporosis without current pathological fracture: Secondary | ICD-10-CM | POA: Diagnosis present

## 2019-02-13 DIAGNOSIS — R918 Other nonspecific abnormal finding of lung field: Secondary | ICD-10-CM | POA: Diagnosis not present

## 2019-02-13 DIAGNOSIS — Z7901 Long term (current) use of anticoagulants: Secondary | ICD-10-CM

## 2019-02-13 DIAGNOSIS — Z8249 Family history of ischemic heart disease and other diseases of the circulatory system: Secondary | ICD-10-CM

## 2019-02-13 DIAGNOSIS — Z743 Need for continuous supervision: Secondary | ICD-10-CM | POA: Diagnosis not present

## 2019-02-13 DIAGNOSIS — Z880 Allergy status to penicillin: Secondary | ICD-10-CM

## 2019-02-13 LAB — CBC
HCT: 32.2 % — ABNORMAL LOW (ref 36.0–46.0)
Hemoglobin: 10.4 g/dL — ABNORMAL LOW (ref 12.0–15.0)
MCH: 32.2 pg (ref 26.0–34.0)
MCHC: 32.3 g/dL (ref 30.0–36.0)
MCV: 99.7 fL (ref 80.0–100.0)
Platelets: 327 10*3/uL (ref 150–400)
RBC: 3.23 MIL/uL — ABNORMAL LOW (ref 3.87–5.11)
RDW: 13.5 % (ref 11.5–15.5)
WBC: 11.3 10*3/uL — ABNORMAL HIGH (ref 4.0–10.5)
nRBC: 0 % (ref 0.0–0.2)

## 2019-02-13 LAB — POCT I-STAT 7, (LYTES, BLD GAS, ICA,H+H)
Acid-Base Excess: 7 mmol/L — ABNORMAL HIGH (ref 0.0–2.0)
Bicarbonate: 30.7 mmol/L — ABNORMAL HIGH (ref 20.0–28.0)
Calcium, Ion: 1.19 mmol/L (ref 1.15–1.40)
HCT: 28 % — ABNORMAL LOW (ref 36.0–46.0)
Hemoglobin: 9.5 g/dL — ABNORMAL LOW (ref 12.0–15.0)
O2 Saturation: 99 %
Patient temperature: 98.4
Potassium: 4.7 mmol/L (ref 3.5–5.1)
Sodium: 133 mmol/L — ABNORMAL LOW (ref 135–145)
TCO2: 32 mmol/L (ref 22–32)
pCO2 arterial: 37.6 mmHg (ref 32.0–48.0)
pH, Arterial: 7.52 — ABNORMAL HIGH (ref 7.350–7.450)
pO2, Arterial: 119 mmHg — ABNORMAL HIGH (ref 83.0–108.0)

## 2019-02-13 LAB — COMPREHENSIVE METABOLIC PANEL
ALT: 17 U/L (ref 0–44)
AST: 17 U/L (ref 15–41)
Albumin: 3.2 g/dL — ABNORMAL LOW (ref 3.5–5.0)
Alkaline Phosphatase: 89 U/L (ref 38–126)
Anion gap: 13 (ref 5–15)
BUN: 67 mg/dL — ABNORMAL HIGH (ref 8–23)
CO2: 27 mmol/L (ref 22–32)
Calcium: 9.3 mg/dL (ref 8.9–10.3)
Chloride: 94 mmol/L — ABNORMAL LOW (ref 98–111)
Creatinine, Ser: 2.2 mg/dL — ABNORMAL HIGH (ref 0.44–1.00)
GFR calc Af Amer: 21 mL/min — ABNORMAL LOW (ref >60)
GFR calc non Af Amer: 18 mL/min — ABNORMAL LOW (ref >60)
Glucose, Bld: 126 mg/dL — ABNORMAL HIGH (ref 70–99)
Potassium: 5.3 mmol/L — ABNORMAL HIGH (ref 3.5–5.1)
Sodium: 134 mmol/L — ABNORMAL LOW (ref 135–145)
Total Bilirubin: 0.5 mg/dL (ref 0.3–1.2)
Total Protein: 6.7 g/dL (ref 6.5–8.1)

## 2019-02-13 LAB — URINALYSIS, COMPLETE (UACMP) WITH MICROSCOPIC
Bilirubin Urine: NEGATIVE
Glucose, UA: NEGATIVE mg/dL
Hgb urine dipstick: NEGATIVE
Ketones, ur: NEGATIVE mg/dL
Leukocytes,Ua: NEGATIVE
Nitrite: NEGATIVE
Protein, ur: NEGATIVE mg/dL
Specific Gravity, Urine: 1.011 (ref 1.005–1.030)
pH: 7 (ref 5.0–8.0)

## 2019-02-13 LAB — POC SARS CORONAVIRUS 2 AG -  ED: SARS Coronavirus 2 Ag: NEGATIVE

## 2019-02-13 LAB — CORTISOL: Cortisol, Plasma: 11.4 ug/dL

## 2019-02-13 LAB — BRAIN NATRIURETIC PEPTIDE: B Natriuretic Peptide: 278.6 pg/mL — ABNORMAL HIGH (ref 0.0–100.0)

## 2019-02-13 LAB — D-DIMER, QUANTITATIVE: D-Dimer, Quant: 1.08 ug/mL-FEU — ABNORMAL HIGH (ref 0.00–0.50)

## 2019-02-13 LAB — LACTIC ACID, PLASMA: Lactic Acid, Venous: 0.9 mmol/L (ref 0.5–1.9)

## 2019-02-13 LAB — TROPONIN I (HIGH SENSITIVITY)
Troponin I (High Sensitivity): 13 ng/L (ref <18)
Troponin I (High Sensitivity): 13 ng/L (ref <18)

## 2019-02-13 LAB — TSH: TSH: 0.452 u[IU]/mL (ref 0.350–4.500)

## 2019-02-13 LAB — SODIUM, URINE, RANDOM: Sodium, Ur: 33 mmol/L

## 2019-02-13 LAB — MAGNESIUM: Magnesium: 2.2 mg/dL (ref 1.7–2.4)

## 2019-02-13 LAB — PHOSPHORUS: Phosphorus: 3.9 mg/dL (ref 2.5–4.6)

## 2019-02-13 MED ORDER — TORSEMIDE 20 MG PO TABS: 20.0000 mg | ORAL_TABLET | Freq: Every day | ORAL | Status: DC

## 2019-02-13 MED ORDER — DOXYCYCLINE HYCLATE 100 MG PO TABS
100.0000 mg | ORAL_TABLET | Freq: Once | ORAL | Status: AC
  Administered 2019-02-13: 100 mg via ORAL
  Filled 2019-02-13: qty 1

## 2019-02-13 MED ORDER — DILTIAZEM HCL ER COATED BEADS 120 MG PO CP24
120.0000 mg | ORAL_CAPSULE | Freq: Every day | ORAL | Status: DC
  Administered 2019-02-13 – 2019-02-16 (×3): 120 mg via ORAL
  Filled 2019-02-13 (×5): qty 1

## 2019-02-13 MED ORDER — ENOXAPARIN SODIUM 30 MG/0.3ML ~~LOC~~ SOLN: 30.0000 mg | SUBCUTANEOUS | Status: DC

## 2019-02-13 MED ORDER — SODIUM CHLORIDE 0.9 % IV SOLN
1.0000 g | Freq: Once | INTRAVENOUS | Status: AC
  Administered 2019-02-13: 19:00:00 1 g via INTRAVENOUS
  Filled 2019-02-13: qty 10

## 2019-02-13 MED ORDER — FUROSEMIDE 10 MG/ML IJ SOLN
40.0000 mg | Freq: Two times a day (BID) | INTRAMUSCULAR | Status: DC
  Administered 2019-02-14 (×3): 40 mg via INTRAVENOUS
  Filled 2019-02-13 (×3): qty 4

## 2019-02-13 MED ORDER — IPRATROPIUM-ALBUTEROL 0.5-2.5 (3) MG/3ML IN SOLN
3.0000 mL | Freq: Four times a day (QID) | RESPIRATORY_TRACT | Status: DC
  Administered 2019-02-14: 3 mL via RESPIRATORY_TRACT
  Filled 2019-02-13: qty 3

## 2019-02-13 MED ORDER — DEXTROSE 5 % IV SOLN
500.0000 mg | INTRAVENOUS | Status: DC
  Administered 2019-02-13: 500 mg via INTRAVENOUS
  Filled 2019-02-13 (×3): qty 0.5

## 2019-02-13 MED ORDER — AMIODARONE HCL 200 MG PO TABS
200.0000 mg | ORAL_TABLET | Freq: Every day | ORAL | Status: DC
  Administered 2019-02-13 – 2019-02-16 (×4): 200 mg via ORAL
  Filled 2019-02-13 (×4): qty 1

## 2019-02-13 MED ORDER — METHYLPREDNISOLONE SODIUM SUCC 40 MG IJ SOLR: 40.0000 mg | Freq: Two times a day (BID) | INTRAMUSCULAR | Status: DC

## 2019-02-13 MED ORDER — HEPARIN (PORCINE) 25000 UT/250ML-% IV SOLN
700.0000 [IU]/h | INTRAVENOUS | Status: DC
  Administered 2019-02-13: 700 [IU]/h via INTRAVENOUS
  Filled 2019-02-13: qty 250

## 2019-02-13 MED ORDER — BUDESONIDE 0.25 MG/2ML IN SUSP
0.2500 mg | Freq: Two times a day (BID) | RESPIRATORY_TRACT | Status: DC
  Administered 2019-02-14 – 2019-02-16 (×4): 0.25 mg via RESPIRATORY_TRACT
  Filled 2019-02-13 (×5): qty 2

## 2019-02-13 MED ORDER — IPRATROPIUM-ALBUTEROL 0.5-2.5 (3) MG/3ML IN SOLN
3.0000 mL | Freq: Four times a day (QID) | RESPIRATORY_TRACT | Status: DC | PRN
  Filled 2019-02-13: qty 3

## 2019-02-13 NOTE — H&P (Signed)
History and Physical  Katherine [name] OFB:[id number] DOB: Nov 10, 1917 DOA: 02/13/2019  Referring physician: ER provider PCP: [name], MD  Outpatient Specialists:    Patient coming from: [facility]  Chief Complaint: Shortness of breath, was on exertion  HPI:  Patient is an 85 year old Caucasian female with past medical history significant for COPD on 2 to 4 L of supplemental oxygen at baseline, lung nodule, CVA, uterine cancer, hypothyroidism, hypertension, grade 2 diastolic dysfunction, frequent UTIs and prior history of thoracentesis in April 2020 for right-sided pleural effusion.  Patient presents with 3 to 4-day history of worsening shortness of breath, worse on exertion.  No associated leg edema.  No reported orthopnea.  No fever or chills.  Patient has occasional cough, but nonproductive.  On presentation to the hospital, patient has remained afebrile, with heart rate of 99 to 128 bpm.  Repeat COVID-19 test is negative.  Chest x-ray is said to reveal right upper lobe and right lower lobe airspace disease and small pleural effusion.  Mild leukocytosis of 11.3 is noted.  Sodium is 134 potassium of 5.3, BUN of 67 with serum creatinine of 2.2 (up from 1.6).  Patient was mildly hypotensive, with systolic blood pressure of 89 mmHg at some point.  ABG revealed pH of 7.52, PCO2 of 37.6 and PO2 of 119.  D-dimer is pending.  Cardiac BNP is pending.  Patient be admitted for further assessment and management.  ED Course: On presentation to the hospital, temperature was 98.4, heart rate has ranged from 97 to 128 bpm, respiratory rate of 16, blood pressure of 89-128/59-78 mmHg, O2 sat of 87 to 100%.  Pertinent labs as documented above.  ER provider has started patient on antibiotics.  Hospitalist team has been called to admit patient for further assessment and management. Pertinent labs: As documented above.  EKG: Independently reviewed.   Imaging: independently reviewed.   Review of Systems:   Negative for fever, visual changes, sore throat, rash, new muscle aches, chest pain, dysuria, bleeding, n/v/abdominal pain.  Past Medical History:  Diagnosis Date  . Arthritis   . Constipation   . COPD (chronic obstructive pulmonary disease) ([place])   . Fatigue   . Frequent UTI   . GERD (gastroesophageal reflux disease)   . History of shingles 2007  . Hyperlipemia   . Hypertension   . Hypothyroidism   . Multiple allergies   . Osteoporosis   . Paget's carcinoma of the nipple ([place]) 03/16/2013  . Pneumonia   . Seasonal allergies   . Stroke [facility]) 2010   no residual  . Thyroid disease   . Uterine cancer [facility])     Past Surgical History:  Procedure Laterality Date  . ABDOMINAL HYSTERECTOMY    . BREAST LUMPECTOMY     left breast  . CAROTID ENDARTERECTOMY Left June 10, 2008   CE  . EYE SURGERY     cataracts removed bilaterally  . TONSILLECTOMY       reports that she quit smoking about 38 years ago. Her smoking use included cigarettes. She has a 40.00 pack-year smoking history. She has never used smokeless tobacco. She reports current alcohol use. She reports that she does not use drugs.  Allergies  Allergen Reactions  . Sulfa Antibiotics Shortness Of Breath and Palpitations  . Sulfamethoxazole Shortness Of Breath and Palpitations  . Sulfur Shortness Of Breath and Palpitations    "Allergic," per MAR (although I believe they might've meant "Sulfa"??)  . Amoxicillin Rash  . Penicillin G Rash  Did it involve swelling of the face/tongue/throat, SOB, or low BP? Yes Did it involve sudden or severe rash/hives, skin peeling, or any reaction on the inside of your mouth or nose? Unk Did you need to seek medical attention at a hospital or doctor's office? Unk When did it last happen? "it was a long time ago" If all above answers are "NO", may proceed with cephalosporin use.   Marland Kitchen Penicillins Rash    Did it involve swelling of the face/tongue/throat, SOB, or low BP? Yes Did it  involve sudden or severe rash/hives, skin peeling, or any reaction on the inside of your mouth or nose? Unk Did you need to seek medical attention at a hospital or doctor's office? Unk When did it last happen? "it was a long time ago" If all above answers are "NO", may proceed with cephalosporin use.   . Tape Other (See Comments)    SKIN IS VERY FRAGILE- PLEASE USE AN ALTERNATIVE TO TAPE, AS THE SKIN TEARS EASILY!!    Family History  Problem Relation Age of Onset  . Heart failure Mother   . Heart disease Mother   . Heart failure Father   . Heart disease Father   . Heart disease Brother   . Asthma Paternal Grandmother      Prior to Admission medications   Medication Sig Start Date End Date Taking? Authorizing Provider  acetaminophen (TYLENOL) 500 MG tablet Take 500 mg by mouth 2 (two) times daily.    [provider]  amiodarone (PACERONE) 200 MG tablet Take 200 mg by mouth daily.    [provider]  apixaban (ELIQUIS) 2.5 MG TABS tablet Take 1 tablet (2.5 mg total) by mouth 2 (two) times daily. 03/21/18   Elgergawy, Silver Huguenin, MD  diltiazem (CARDIZEM CD) 120 MG 24 hr capsule Take 1 capsule (120 mg total) by mouth daily. 03/22/18   Elgergawy, Silver Huguenin, MD  ergocalciferol (VITAMIN D2) 1.25 MG (50000 UT) capsule Take 1,250 Units by mouth once a week. Once a day on Sunday    [provider]  fluticasone (FLONASE) 50 MCG/ACT nasal spray Place 2 sprays into both nostrils every morning.    [provider]  Fluticasone-Salmeterol (ADVAIR) 250-50 MCG/DOSE AEPB Inhale 1 puff into the lungs 2 (two) times a day.     [provider]  ipratropium-albuterol (DUONEB) 0.5-2.5 (3) MG/3ML SOLN Take 3 mLs by nebulization every 6 (six) hours as needed (WHEEZING). 03/21/18   Elgergawy, Silver Huguenin, MD  levothyroxine (SYNTHROID) 100 MCG tablet Take 100 mcg by mouth daily before breakfast.    [provider]  linaclotide (LINZESS) 145 MCG CAPS capsule Take 145 mcg by  mouth every other day. 09/21/18   [provider]  Nutritional Supplements (RESOURCE 2.0) LIQD Take by mouth. 120 mL TID B/W meals Three Times A Day Between Meals    [provider]  OXYGEN Inhale 2-4 L into the lungs continuous. SAT MUST BE >90%    [provider]  pantoprazole (PROTONIX) 40 MG tablet Take 40 mg by mouth daily.  12/10/17   [provider]  potassium chloride (KLOR-CON) 20 MEQ packet Take 20 mEq by mouth daily.     [provider]  sennosides-docusate sodium (SENOKOT-S) 8.6-50 MG tablet Take 2 tablets by mouth daily.    [provider]  sodium chloride (OCEAN) 0.65 % SOLN nasal spray Place 1 spray into both nostrils. Apply to RT and LT Nostril Twice A Day    [provider]  tiotropium (SPIRIVA) 18 MCG inhalation capsule Place 18 mcg into inhaler and inhale daily.    [provider]  torsemide (DEMADEX) 20 MG tablet Take 20 mg by mouth daily.    [provider]  zinc oxide 20 % ointment Apply 1 application topically as needed for irritation. Apply to buttocks after every incontinent episode and as needed for redness    [provider]    Physical Exam: Vitals:   02/13/19 1830 02/13/19 1845 02/13/19 1900 02/13/19 1915  BP: (!) 96/54 123/63 100/68 111/60  Pulse: 97   97  Resp: (!) 23 14 15 16   Temp:      TempSrc:      SpO2: 100%   98%    Constitutional:  . Appears calm and comfortable Eyes:  Marland Kitchen Mild pallor. No jaundice.  ENMT:  . external ears, nose appear normal Neck:  . Neck is supple.  JVD is equivocal, with prominent neck veins.   Respiratory:  . CTA bilaterally. Cardiovascular:  . S1S2 . No LE extremity edema   Abdomen:  . Abdomen is soft and non tender. Organs are not palpable.   Neurologic:  . Awake and alert. . Moves all limbs.  Wt Readings from Last 3 Encounters:  02/13/19 44.6 kg  02/01/19 43.9 kg  01/22/19 44.5 kg    I have personally reviewed following  labs and imaging studies  Labs on Admission:  CBC: Recent Labs  Lab 02/13/19 1659  WBC 11.3*  HGB 10.4*  HCT 32.2*  MCV 99.7  PLT 703   Basic Metabolic Panel: Recent Labs  Lab 02/13/19 1659  NA 134*  K 5.3*  CL 94*  CO2 27  GLUCOSE 126*  BUN 67*  CREATININE 2.20*  CALCIUM 9.3   Liver Function Tests: Recent Labs  Lab 02/13/19 1659  AST 17  ALT 17  ALKPHOS 89  BILITOT 0.5  PROT 6.7  ALBUMIN 3.2*   No results for input(s): LIPASE, AMYLASE in the last 168 hours. No results for input(s): AMMONIA in the last 168 hours. Coagulation Profile: No results for input(s): INR, PROTIME in the last 168 hours. Cardiac Enzymes: No results for input(s): CKTOTAL, CKMB, CKMBINDEX, TROPONINI in the last 168 hours. BNP (last 3 results) No results for input(s): PROBNP in the last 8760 hours. HbA1C: No results for input(s): HGBA1C in the last 72 hours. CBG: No results for input(s): GLUCAP in the last 168 hours. Lipid Profile: No results for input(s): CHOL, HDL, LDLCALC, TRIG, CHOLHDL, LDLDIRECT in the last 72 hours. Thyroid Function Tests: No results for input(s): TSH, T4TOTAL, FREET4, T3FREE, THYROIDAB in the last 72 hours. Anemia Panel: No results for input(s): VITAMINB12, FOLATE, FERRITIN, TIBC, IRON, RETICCTPCT in the last 72 hours. Urine analysis:    Component Value Date/Time   COLORURINE YELLOW 03/16/2012 [place] 03/16/2012 1126   LABSPEC 1.020 03/16/2012 1126   PHURINE 7.5 03/16/2012 1126   GLUCOSEU NEGATIVE 03/16/2012 1126   [place] 03/16/2012 1126   BILIRUBINUR NEGATIVE 03/16/2012 1126   KETONESUR TRACE (A) 03/16/2012 1126   PROTEINUR 30 (A) 03/16/2012 1126   UROBILINOGEN 1.0 03/16/2012 1126   NITRITE NEGATIVE 03/16/2012 1126   LEUKOCYTESUR NEGATIVE 03/16/2012 1126   Sepsis Labs: @LABRCNTIP (procalcitonin:4,lacticidven:4) )No results found for this or any previous visit (from the past 240 hour(s)).    Radiological Exams on  Admission: DG Chest Port 1 View  Result Date: 02/13/2019 CLINICAL DATA:  Shortness of breath for 3 days EXAM: PORTABLE  CHEST 1 VIEW COMPARISON:  05/22/2018 FINDINGS: There is a small right pleural effusion. There is a trace left pleural effusion. There bilateral emphysematous changes. There is right upper and right lower lung interstitial and alveolar airspace disease. There is no pneumothorax. The heart mediastinum are stable. There is no acute osseous abnormality. IMPRESSION: 1. Small right pleural effusion. Right upper and right lower lung airspace disease which may reflect atypical pulmonary edema versus multilobar pneumonia. Electronically Signed   By: Kathreen Devoid   On: 02/13/2019 18:14    EKG: Independently reviewed.   Active Problems:   Acute on chronic respiratory failure (HCC)   Assessment/Plan Acute on chronic hypoxic respiratory failure: -Patient has history of COPD on 2 to 4 L of supplemental oxygen. -No significant wheezing when I examined patient.  Patient may have had wheezing on presentation. -Continue nebs Pulmicort as well as nebs DuoNeb.  Will hold IV steroids for now. -Considering ABG result, will pursue D-dimer.  Cardiac BNP is pending. -Further management will depend on hospital course.  Possible acute on chronic diastolic CHF: -Start patient on IV Lasix 40 Mg twice daily. -Monitor blood pressure closely, as well as, renal function and electrolytes. -Further management depend on hospital course.  Acute kidney injury and CKD 3B: -Etiology of AKI is uncertain -Patient has had low systolic blood pressure -Patient may also have cardiorenal syndrome -We will check UA -We will get a renal ultrasound -Optimize volume -We will monitor renal function and electrolytes -Further management depend on hospital course.  COPD on 2 to 4 L supplemental oxygen at baseline: -Based on my examination, lung findings are not consistent with exacerbation of the COPD. -ABG finding  is not consistent. -Check D-dimer -We will still start patient on nebs Pulmicort and DuoNeb. -Low threshold to add oral or IV steroids if indicated  Mild hyponatremia and elevated potassium: -Check cortisol level to rule out possible adrenal insufficiency.  Low systolic blood pressure noted. -Hopefully, the elevated potassium will improve with diuresis, as well as, D hyponatremia -Monitor renal function closely -Further management will depend on above.  Possible pneumonia: Panculture patient. Start patient on broad-spectrum antibiotics.  History of lung nodule: Repeat CT chest without contrast Further management depend on hospital course.  DVT prophylaxis: Subcu Lovenox Code Status: DO NOT RESUSCITATE Family Communication:  Disposition Plan: This will depend on hospital course Consults called: None Admission status: Inpatient  Time spent: 63 Minutes  Dana Allan, MD  [facility] Pager #: [phone] 7PM-7AM contact night coverage as above  02/13/2019, 7:40 PM

## 2019-02-13 NOTE — ED Notes (Signed)
Lisa Cheek (Niece/[phone]).

## 2019-02-13 NOTE — Progress Notes (Signed)
Location:   [place] Room Number: 68 Place of Service:  SNF (31) Provider:  Pasty Manninen NP  [name], MD  Patient Care Team: [name], MD as PCP - General (Internal Medicine) Debara Pickett Nadean Corwin, MD as PCP - Cardiology (Cardiology) Nicholas Lose, MD as Consulting Physician (Hematology and Oncology)  Extended Emergency Contact Information Primary Emergency Contact: Little Ishikawa Mobile Phone: [phone] Relation: Niece Secondary Emergency Contact: Cromer,Chad Mobile Phone: [phone] Relation: Nephew  Code Status:  DNR Goals of care: Advanced Directive information Advanced Directives 12/20/2018  Does Patient Have a Medical Advance Directive? Yes  Type of Paramedic of [place];Living will;Out of facility DNR (pink MOST or yellow form)  Does patient want to make changes to medical advance directive? No - Patient declined  Copy of [place] in Chart? -  Would patient like information on creating a medical advance directive? -  Pre-existing out of facility DNR order (yellow form or pink MOST form) -     Chief Complaint  Patient presents with  . Acute Visit    Shortness of breath    HPI:  Pt is a 85 y.o. female seen today for an acute visit for SOB, O2 desaturation, the patient denied chest pain/pressure or cough. She is afebrile. 05/19/18 Thoracentesis for pleural effusion. The patient desires ED evaluation, she thinks her pleural effusion has recurred. Hx of CHF, on Torsemide 20mg  qd. AFib, heart rate is 120s, on Diltiazem 120mg  qd, Amiodarone 200mg  qd.  COPD, O2 dependent, on Spiriva qd, DuoNEb prn, Advair qd.  GERD, stable, on Pantoprazole 40mg  qd.     Past Medical History:  Diagnosis Date  . Arthritis   . Constipation   . COPD (chronic obstructive pulmonary disease) ([place])   . Fatigue   . Frequent UTI   . GERD (gastroesophageal reflux disease)   . History of shingles 2007  . Hyperlipemia   .  Hypertension   . Hypothyroidism   . Multiple allergies   . Osteoporosis   . Paget's carcinoma of the nipple ([place]) 03/16/2013  . Pneumonia   . Seasonal allergies   . Stroke [facility]) 2010   no residual  . Thyroid disease   . Uterine cancer [facility])    Past Surgical History:  Procedure Laterality Date  . ABDOMINAL HYSTERECTOMY    . BREAST LUMPECTOMY     left breast  . CAROTID ENDARTERECTOMY Left June 10, 2008   CE  . EYE SURGERY     cataracts removed bilaterally  . TONSILLECTOMY      Allergies  Allergen Reactions  . Sulfa Antibiotics Shortness Of Breath and Palpitations  . Sulfamethoxazole Shortness Of Breath and Palpitations  . Sulfur Shortness Of Breath and Palpitations    "Allergic," per MAR (although I believe they might've meant "Sulfa"??)  . Amoxicillin Rash  . Penicillin G Rash    Did it involve swelling of the face/tongue/throat, SOB, or low BP? Yes Did it involve sudden or severe rash/hives, skin peeling, or any reaction on the inside of your mouth or nose? Unk Did you need to seek medical attention at a hospital or doctor's office? Unk When did it last happen? "it was a long time ago" If all above answers are "NO", may proceed with cephalosporin use.   Marland Kitchen Penicillins Rash    Did it involve swelling of the face/tongue/throat, SOB, or low BP? Yes Did it involve sudden or severe rash/hives, skin peeling, or any reaction on  the inside of your mouth or nose? Unk Did you need to seek medical attention at a hospital or doctor's office? Unk When did it last happen? "it was a long time ago" If all above answers are "NO", may proceed with cephalosporin use.   . Tape Other (See Comments)    SKIN IS VERY FRAGILE- PLEASE USE AN ALTERNATIVE TO TAPE, AS THE SKIN TEARS EASILY!!    Allergies as of 02/13/2019      Reactions   Sulfa Antibiotics Shortness Of Breath, Palpitations   Sulfamethoxazole Shortness Of Breath, Palpitations   Sulfur Shortness Of Breath, Palpitations    "Allergic," per MAR (although I believe they might've meant "Sulfa"??)   Amoxicillin Rash   Penicillin G Rash   Did it involve swelling of the face/tongue/throat, SOB, or low BP? Yes Did it involve sudden or severe rash/hives, skin peeling, or any reaction on the inside of your mouth or nose? Unk Did you need to seek medical attention at a hospital or doctor's office? Unk When did it last happen? "it was a long time ago" If all above answers are "NO", may proceed with cephalosporin use.   Penicillins Rash   Did it involve swelling of the face/tongue/throat, SOB, or low BP? Yes Did it involve sudden or severe rash/hives, skin peeling, or any reaction on the inside of your mouth or nose? Unk Did you need to seek medical attention at a hospital or doctor's office? Unk When did it last happen? "it was a long time ago" If all above answers are "NO", may proceed with cephalosporin use.   Tape Other (See Comments)   SKIN IS VERY FRAGILE- PLEASE USE AN ALTERNATIVE TO TAPE, AS THE SKIN TEARS EASILY!!      Medication List       Accurate as of February 13, 2019  3:46 PM. If you have any questions, ask your nurse or doctor.        acetaminophen 500 MG tablet Commonly known as: TYLENOL Take 500 mg by mouth 2 (two) times daily.   amiodarone 200 MG tablet Commonly known as: PACERONE Take 200 mg by mouth daily.   apixaban 2.5 MG Tabs tablet Commonly known as: ELIQUIS Take 1 tablet (2.5 mg total) by mouth 2 (two) times daily.   Demadex 20 MG tablet Generic drug: torsemide Take 20 mg by mouth daily.   diltiazem 120 MG 24 hr capsule Commonly known as: CARDIZEM CD Take 1 capsule (120 mg total) by mouth daily.   ergocalciferol 1.25 MG (50000 UT) capsule Commonly known as: VITAMIN D2 Take 1,250 Units by mouth once a week. Once a day on Sunday   fluticasone 50 MCG/ACT nasal spray Commonly known as: FLONASE Place 2 sprays into both nostrils every morning.   Fluticasone-Salmeterol  250-50 MCG/DOSE Aepb Commonly known as: ADVAIR Inhale 1 puff into the lungs 2 (two) times a day.   ipratropium-albuterol 0.5-2.5 (3) MG/3ML Soln Commonly known as: DUONEB Take 3 mLs by nebulization every 6 (six) hours as needed (WHEEZING).   levothyroxine 100 MCG tablet Commonly known as: SYNTHROID Take 100 mcg by mouth daily before breakfast.   linaclotide 145 MCG Caps capsule Commonly known as: LINZESS Take 145 mcg by mouth every other day.   OXYGEN Inhale 2-4 L into the lungs continuous. SAT MUST BE >90%   pantoprazole 40 MG tablet Commonly known as: PROTONIX Take 40 mg by mouth daily.   potassium chloride 20 MEQ packet Commonly known as: KLOR-CON Take 20 mEq by mouth  daily.   Resource 2.0 Liqd Take by mouth. 120 mL TID B/W meals Three Times A Day Between Meals   sennosides-docusate sodium 8.6-50 MG tablet Commonly known as: SENOKOT-S Take 2 tablets by mouth daily.   sodium chloride 0.65 % Soln nasal spray Commonly known as: OCEAN Place 1 spray into both nostrils. Apply to RT and LT Nostril Twice A Day   tiotropium 18 MCG inhalation capsule Commonly known as: SPIRIVA Place 18 mcg into inhaler and inhale daily.   zinc oxide 20 % ointment Apply 1 application topically as needed for irritation. Apply to buttocks after every incontinent episode and as needed for redness       Review of Systems  Constitutional: Positive for activity change, appetite change and fatigue. Negative for unexpected weight change.  HENT: Positive for hearing loss. Negative for congestion and voice change.   Respiratory: Positive for shortness of breath. Negative for cough and wheezing.   Cardiovascular: Negative for chest pain, palpitations and leg swelling.  Gastrointestinal: Negative for abdominal distention, abdominal pain, constipation, diarrhea, nausea and vomiting.  Genitourinary: Negative for difficulty urinating, dysuria and urgency.  Musculoskeletal: Positive for arthralgias  and gait problem.  Skin: Negative for color change and pallor.  Neurological: Negative for dizziness, speech difficulty, weakness and headaches.  Psychiatric/Behavioral: Negative for agitation, behavioral problems, confusion, hallucinations and sleep disturbance. The patient is not nervous/anxious.     Immunization History  Administered Date(s) Administered  . Influenza, High Dose Seasonal PF 11/30/2018  . Influenza-Unspecified 11/15/2013  . PPD Test 07/24/2011  . Zoster Recombinat (Shingrix) 12/10/2017   Pertinent  Health Maintenance Due  Topic Date Due  . PNA vac Low Risk Adult (1 of 2 - PCV13) 10/02/1984  . INFLUENZA VACCINE  Completed  . DEXA SCAN  Completed   Fall Risk  01/28/2014  Falls in the past year? Yes  Number falls in past yr: 1  Injury with Fall? No   Functional Status Survey:    Vitals:   02/13/19 1512  BP: 128/78  Pulse: 69  Resp: 20  Temp: 97.7 F (36.5 C)  SpO2: 96%  Weight: 98 lb 6.4 oz (44.6 kg)  Height: 4\' 10"  (1.473 m)   Body mass index is 20.57 kg/m. Physical Exam Vitals and nursing note reviewed.  Constitutional:      Appearance: She is ill-appearing. She is not toxic-appearing or diaphoretic.  HENT:     Head: Normocephalic and atraumatic.     Nose: Nose normal.     Mouth/Throat:     Mouth: Mucous membranes are moist.  Eyes:     Extraocular Movements: Extraocular movements intact.     Conjunctiva/sclera: Conjunctivae normal.     Pupils: Pupils are equal, round, and reactive to light.  Cardiovascular:     Rate and Rhythm: Tachycardia present. Rhythm irregular.     Heart sounds: Murmur present.  Pulmonary:     Breath sounds: Rales present.     Comments: Posterior lungs, mid to lower, L>R. O2 4lpm via [place] Abdominal:     General: Bowel sounds are normal. There is no distension.     Palpations: Abdomen is soft.     Tenderness: There is no abdominal tenderness. There is no right CVA tenderness, left CVA tenderness, guarding or rebound.    Musculoskeletal:     Cervical back: Normal range of motion and neck supple.     Right lower leg: No edema.     Left lower leg: No edema.  Skin:  General: Skin is warm and dry.  Neurological:     General: No focal deficit present.     Mental Status: She is alert and oriented to person, place, and time. Mental status is at baseline.     Motor: No weakness.     Coordination: Coordination normal.     Gait: Gait abnormal.  Psychiatric:        Mood and Affect: Mood normal.        Behavior: Behavior normal.        Thought Content: Thought content normal.        Judgment: Judgment normal.     Labs reviewed: Recent Labs    03/17/18 1753 03/18/18 0505 05/01/18 0545 05/01/18 0545 05/02/18 0402 05/11/18 0000 05/21/18 0815 05/22/18 4944 05/22/18 9675 05/23/18 0326 06/08/18 0000 06/29/18 0000 08/18/18 0000 10/05/18 0000  NA  --    < > 134*   < > 133*   < > 138 137   < > 136 137 135* 137 137  K  --   --  3.9  --  4.4  --  3.8 3.3*  --  3.0* 4.8 4.4 4.0 4.4  CL  --    < > 95*  --  96*   < > 95* 93*  --  93* 98  --   --  95  CO2  --    < > 29  --  27   < > 33* 35*  --  33* 32  --   --  35  GLUCOSE  --   --  140*  --  131*   < > 96 95  --  94  --   --   --   --   BUN  --    < > 34*   < > 43*   < > 26* 26*   < > 25* 38* 45* 54* 51*  CREATININE  --    < > 1.39*   < > 1.50*   < > 1.48* 1.32*   < > 1.17* 1.5* 1.9* 1.8* 1.6*  CALCIUM  --    < > 8.9  --  8.7*   < > 9.1 8.6*  --  8.5* 8.7  --   --  9.0  MG 1.7  --  2.0  --  1.9  --   --   --   --   --   --   --   --   --    < > = values in this interval not displayed.   Recent Labs    05/01/18 0545 05/02/18 0402 05/11/18 0000 05/19/18 2000  AST 19 23 11* 19  ALT 19 24 12 19   ALKPHOS 62 56 56 79  BILITOT 0.5 0.6  --  0.7  PROT 6.3* 5.7*  --  6.3*  ALBUMIN 3.0* 2.9*  --  3.1*   Recent Labs    05/02/18 0402 05/19/18 2000 05/21/18 0815 05/29/18 0000 10/05/18 0000  WBC 9.2 10.0 12.0*  --  7.1  NEUTROABS  --  7.9* 9.1*  --   4,771  HGB 9.9* 11.2* 10.4* 9.6* 9.1*  HCT 30.2* 35.8* 31.9* 29* 28*  MCV 93.8 94.7 94.4  --   --   PLT 348 248 289 339 294   Lab Results  Component Value Date   TSH 2.84 07/06/2018   No results found for: HGBA1C No results found for: CHOL, HDL, LDLCALC, LDLDIRECT, TRIG, CHOLHDL  Significant Diagnostic  Results in last 30 days:  No results found.  Assessment/Plan Acute respiratory failure with hypoxia and hypercapnia (HCC) Worsened regardless O2 via [place], continue Spiriva, Advair, prn DuoNeb, O2, ED eval.   COPD (chronic obstructive pulmonary disease) (HCC) Chronic, worsened SOB, Ed eval. Continue DuoNeb prn, Advair qd, Spiriva qd  Atrial fibrillation (HCC) Heart rate is not controlled, HR in 120-130 bpm, continue Amiodarone, Diltiazem. ED eval.   Diastolic CHF ([place]) C/o worsened SOB in setting of CHF, no apparent s/s of fluid retention, continue Torsemide.   Pleural effusion due to CHF (congestive heart failure) ([place]) The patient thinks her pleural effusion has recurred, desires ED eval.   GERD (gastroesophageal reflux disease) Stable, continue Protonix.     Family/ staff Communication: plan of care reviewed with the patient and charge nurse.   Labs/tests ordered:  none  Time spend 25 minutes.

## 2019-02-13 NOTE — ED Provider Notes (Signed)
[facility] EMERGENCY DEPARTMENT Provider Note   CSN: [id number] Arrival date & time: 02/13/19  1640     History No chief complaint on file.   Katherine [name] is a 85 y.o. female.  The history is provided by the patient and medical records. No language interpreter was used.   Katherine [name] is a 85 y.o. female who presents to the Emergency Department complaining of shortness of breath. She presents the emergency department from [facility] [place] for evaluation of shortness of breath over the last three days. She complains of significant dyspnea on exertion as well as shortness of breath at rest. She denies any fevers, chest pain, cough, leg swelling or pain, recent medication changes. She is on 4 L nasal cannula at baseline. No known COVID 19 exposures. No recent medication changes. She experienced similar symptoms one year ago and required hospitalization at that time. Symptoms are moderate to severe, constant, worsening.    Past Medical History:  Diagnosis Date  . Arthritis   . Constipation   . COPD (chronic obstructive pulmonary disease) ([place])   . Fatigue   . Frequent UTI   . GERD (gastroesophageal reflux disease)   . History of shingles 2007  . Hyperlipemia   . Hypertension   . Hypothyroidism   . Multiple allergies   . Osteoporosis   . Paget's carcinoma of the nipple ([place]) 03/16/2013  . Pneumonia   . Seasonal allergies   . Stroke [facility]) 2010   no residual  . Thyroid disease   . Uterine cancer [facility])     Patient Active Problem List   Diagnosis Date Noted  . Acute on chronic respiratory failure ([place]) 02/13/2019  . Nodule of upper lobe of right lung 02/01/2019  . Generalized osteoarthritis of multiple sites 02/01/2019  . Chest pain of uncertain etiology 78/29/5621  . Epistaxis 12/26/2018  . Allergic rhinitis 12/26/2018  . Right groin pain 12/08/2018  . TIA (transient ischemic attack) 09/12/2018  . Chronic pain of right knee 09/12/2018  . Slow transit  constipation 08/29/2018  . GERD (gastroesophageal reflux disease) 08/29/2018  . Skin tag of anus 08/29/2018  . Light headedness 08/10/2018  . Acute respiratory failure with hypoxia and hypercapnia ([place]) 05/20/2018  . Pleural effusion due to CHF (congestive heart failure) ([place]) 05/19/2018  . Diastolic CHF ([place]) 30/86/5784  . Fatigue 04/25/2018  . Atrial fibrillation ([place]) 03/17/2018  . Chronic anemia 03/17/2018  . Kidney disease, chronic, stage IV (severe, EGFR 15-29 ml/min) ([place]) 03/17/2018  . Breast cancer ([place]) 03/16/2013  . Paget's disease and intraductal carcinoma of left breast ([place]) 03/16/2013  . Occlusion and stenosis of carotid artery without mention of cerebral infarction 01/06/2012  . Atypical mycobacterial disease 08/20/2011  . Hypothyroidism 07/24/2011  . Hyperlipidemia 07/24/2011  . Hypertension 07/24/2011  . Community acquired pneumonia of right lung 07/23/2011  . COPD (chronic obstructive pulmonary disease) ([place]) 07/23/2011    Past Surgical History:  Procedure Laterality Date  . ABDOMINAL HYSTERECTOMY    . BREAST LUMPECTOMY     left breast  . CAROTID ENDARTERECTOMY Left June 10, 2008   CE  . EYE SURGERY     cataracts removed bilaterally  . TONSILLECTOMY       OB History   No obstetric history on file.     Family History  Problem Relation Age of Onset  . Heart failure Mother   . Heart disease Mother   . Heart failure Father   . Heart disease Father   .  Heart disease Brother   . Asthma Paternal Grandmother     Social History   Tobacco Use  . Smoking status: Former Smoker    Packs/day: 1.00    Years: 40.00    Pack years: 40.00    Types: Cigarettes    Quit date: 02/15/1981    Years since quitting: 38.0  . Smokeless tobacco: Never Used  Substance Use Topics  . Alcohol use: Yes  . Drug use: No    Home Medications Prior to Admission medications   Medication Sig Start Date End Date Taking? Authorizing Provider  apixaban (ELIQUIS) 2.5 MG TABS  tablet Take 1 tablet (2.5 mg total) by mouth 2 (two) times daily. 03/21/18  Yes Elgergawy, Silver Huguenin, MD  acetaminophen (TYLENOL) 500 MG tablet Take 500 mg by mouth 2 (two) times daily.    [provider]  amiodarone (PACERONE) 200 MG tablet Take 200 mg by mouth daily.    [provider]  diltiazem (CARDIZEM CD) 120 MG 24 hr capsule Take 1 capsule (120 mg total) by mouth daily. 03/22/18   Elgergawy, Silver Huguenin, MD  ergocalciferol (VITAMIN D2) 1.25 MG (50000 UT) capsule Take 1,250 Units by mouth once a week. Once a day on Sunday    [provider]  fluticasone (FLONASE) 50 MCG/ACT nasal spray Place 2 sprays into both nostrils every morning.    [provider]  Fluticasone-Salmeterol (ADVAIR) 250-50 MCG/DOSE AEPB Inhale 1 puff into the lungs 2 (two) times a day.     [provider]  ipratropium-albuterol (DUONEB) 0.5-2.5 (3) MG/3ML SOLN Take 3 mLs by nebulization every 6 (six) hours as needed ([place]HEEZING). 03/21/18   Elgergawy, Silver Huguenin, MD  levothyroxine (SYNTHROID) 100 MCG tablet Take 100 mcg by mouth daily before breakfast.    [provider]  linaclotide (LINZESS) 145 MCG CAPS capsule Take 145 mcg by mouth every other day. 09/21/18   [provider]  Nutritional Supplements (RESOURCE 2.0) LIQD Take by mouth. 120 mL TID B/W meals Three Times A Day Between Meals    [provider]  OXYGEN Inhale 2-4 L into the lungs continuous. SAT MUST BE >90%    [provider]  pantoprazole (PROTONIX) 40 MG tablet Take 40 mg by mouth daily.  12/10/17   [provider]  potassium chloride (KLOR-CON) 20 MEQ packet Take 20 mEq by mouth daily.     [provider]  sennosides-docusate sodium (SENOKOT-S) 8.6-50 MG tablet Take 2 tablets by mouth daily.    [provider]  sodium chloride (OCEAN) 0.65 % SOLN nasal spray Place 1 spray into both nostrils. Apply to RT and LT Nostril Twice A Day    [provider]    tiotropium (SPIRIVA) 18 MCG inhalation capsule Place 18 mcg into inhaler and inhale daily.    [provider]  torsemide (DEMADEX) 20 MG tablet Take 20 mg by mouth daily.    [provider]  zinc oxide 20 % ointment Apply 1 application topically as needed for irritation. Apply to buttocks after every incontinent episode and as needed for redness    [provider]    Allergies    Sulfa antibiotics, Sulfamethoxazole, Sulfur, Amoxicillin, Penicillin g, Penicillins, and Tape  Review of Systems   Review of Systems  All other systems reviewed and are negative.   Physical Exam Updated Vital Signs BP 102/80 (BP Location: Left Arm)   Pulse 98   Temp 98.4 F (36.9 C) (Oral)   Resp 17  SpO2 99%   Physical Exam Vitals and nursing note reviewed.  Constitutional:      Appearance: She is well-developed.  HENT:     Head: Normocephalic and atraumatic.  Cardiovascular:     Rate and Rhythm: Regular rhythm. Tachycardia present.     Heart sounds: No murmur.  Pulmonary:     Effort: Pulmonary effort is normal. No respiratory distress.     Breath sounds: Normal breath sounds.  Abdominal:     Palpations: Abdomen is soft.     Tenderness: There is no abdominal tenderness. There is no guarding or rebound.  Musculoskeletal:        General: No tenderness.     Comments: Kyphosis. There is erythema over the lower thoracic spine without local edema.  Skin:    General: Skin is warm and dry.  Neurological:     Mental Status: She is alert and oriented to person, place, and time.  Psychiatric:        Behavior: Behavior normal.     ED Results / Procedures / Treatments   Labs (all labs ordered are listed, but only abnormal results are displayed) Labs Reviewed  CBC - Abnormal; Notable for the following components:      Result Value   WBC 11.3 (*)    RBC 3.23 (*)    Hemoglobin 10.4 (*)    HCT 32.2 (*)    All other components within normal limits  COMPREHENSIVE  METABOLIC PANEL - Abnormal; Notable for the following components:   Sodium 134 (*)    Potassium 5.3 (*)    Chloride 94 (*)    Glucose, Bld 126 (*)    BUN 67 (*)    Creatinine, Ser 2.20 (*)    Albumin 3.2 (*)    GFR calc non Af Amer 18 (*)    GFR calc Af Amer 21 (*)    All other components within normal limits  BRAIN NATRIURETIC PEPTIDE - Abnormal; Notable for the following components:   B Natriuretic Peptide 278.6 (*)    All other components within normal limits  D-DIMER, QUANTITATIVE (NOT AT [facility]) - Abnormal; Notable for the following components:   D-Dimer, Quant 1.08 (*)    All other components within normal limits  POCT I-STAT 7, (LYTES, BLD GAS, ICA,H+H) - Abnormal; Notable for the following components:   pH, Arterial 7.520 (*)    pO2, Arterial 119.0 (*)    Bicarbonate 30.7 (*)    Acid-Base Excess 7.0 (*)    Sodium 133 (*)    HCT 28.0 (*)    Hemoglobin 9.5 (*)    All other components within normal limits  CULTURE, BLOOD (ROUTINE X 2)  CULTURE, BLOOD (ROUTINE X 2)  SARS CORONAVIRUS 2 (TAT 6-24 HRS)  MRSA PCR SCREENING  EXPECTORATED SPUTUM ASSESSMENT W REFEX TO RESP CULTURE  LACTIC ACID, PLASMA  MAGNESIUM  PHOSPHORUS  TSH  URINALYSIS, COMPLETE (UACMP) WITH MICROSCOPIC  SODIUM, URINE, RANDOM  CORTISOL  BLOOD GAS, ARTERIAL  BASIC METABOLIC PANEL  CBC  HEPARIN LEVEL (UNFRACTIONATED)  APTT  POC SARS CORONAVIRUS 2 AG -  ED  TROPONIN I (HIGH SENSITIVITY)  TROPONIN I (HIGH SENSITIVITY)    EKG EKG Interpretation  Date/Time:  Tuesday February 13 2019 17:10:17 EST Ventricular Rate:  102 PR Interval:    QRS Duration: 72 QT Interval:  346 QTC Calculation: 450 R Axis:   76 Text Interpretation: Atrial fibrillation with rapid ventricular response Indeterminate axis Cannot rule out Anteroseptal infarct , age undetermined ST &  T wave abnormality, consider inferior ischemia Abnormal ECG Confirmed by Quintella Reichert [phone]) on 02/13/2019 5:15:04 PM   Radiology US  Renal  Result Date: 02/13/2019 CLINICAL DATA:  Acute kidney injury. EXAM: RENAL / URINARY TRACT ULTRASOUND COMPLETE COMPARISON:  None. FINDINGS: Right Kidney: Renal measurements: 8.7 x 3.0 x 2.0 cm = volume: 29 mL. Increased echogenicity. No mass or hydronephrosis visualized. Left Kidney: Renal measurements: 8.1 x 3.1 x 2.4 cm = volume: 32 mL. Increased echogenicity. No mass or hydronephrosis visualized. Bladder: Multiple bladder diverticula noted.  No wall thickening. Other: None. IMPRESSION: 1. Bilateral renal atrophy and increased cortical echogenicity, consistent with underlying medical renal disease. 2. Multiple bladder diverticula. Electronically Signed   By: Titus Dubin M.D.   On: 02/13/2019 21:15   DG Chest Port 1 View  Result Date: 02/13/2019 CLINICAL DATA:  Shortness of breath for 3 days EXAM: PORTABLE CHEST 1 VIEW COMPARISON:  05/22/2018 FINDINGS: There is a small right pleural effusion. There is a trace left pleural effusion. There bilateral emphysematous changes. There is right upper and right lower lung interstitial and alveolar airspace disease. There is no pneumothorax. The heart mediastinum are stable. There is no acute osseous abnormality. IMPRESSION: 1. Small right pleural effusion. Right upper and right lower lung airspace disease which may reflect atypical pulmonary edema versus multilobar pneumonia. Electronically Signed   By: Kathreen Devoid   On: 02/13/2019 18:14    Procedures Procedures (including critical care time)  Medications Ordered in ED Medications  amiodarone (PACERONE) tablet 200 mg (has no administration in time range)  diltiazem (CARDIZEM CD) 24 hr capsule 120 mg (has no administration in time range)  budesonide (PULMICORT) nebulizer solution 0.25 mg (has no administration in time range)  ipratropium-albuterol (DUONEB) 0.5-2.5 (3) MG/3ML nebulizer solution 3 mL (has no administration in time range)  ipratropium-albuterol (DUONEB) 0.5-2.5 (3) MG/3ML nebulizer  solution 3 mL (has no administration in time range)  ceFEPIme (MAXIPIME) 500 mg in dextrose 5 % 50 mL IVPB (0 mg Intravenous Stopped 02/13/19 2323)  furosemide (LASIX) injection 40 mg (has no administration in time range)  heparin ADULT infusion 100 units/mL (25000 units/241m sodium chloride 0.45%) (has no administration in time range)  cefTRIAXone (ROCEPHIN) 1 g in sodium chloride 0.9 % 100 mL IVPB (0 g Intravenous Stopped 02/13/19 2007)  doxycycline (VIBRA-TABS) tablet 100 mg (100 mg Oral Given 02/13/19 1921)    ED Course  I have reviewed the triage vital signs and the nursing notes.  Pertinent labs & imaging results that were available during my care of the patient were reviewed by me and considered in my medical decision making (see chart for details).    MDM Rules/Calculators/A&P                      Patient presents for increased shortness of breath. Chest x-ray concerning for pneumonia, will treat with antibiotics. She is in atrial fibrillation, mostly controlled in the 90s. Labs significant for AKI. Plan to admit for further management. Hospitals consulted for admission. Patient updated findings of studies recommendation for admission and she is in agreement with treatment plan.  Final Clinical Impression(s) / ED Diagnoses Final diagnoses:  AKI (acute kidney injury) ([facility]    Rx / DOmahaOrders ED Discharge Orders    None       RQuintella Reichert MD 02/13/19 2338

## 2019-02-13 NOTE — Assessment & Plan Note (Signed)
The patient thinks her pleural effusion has recurred, desires ED eval.

## 2019-02-13 NOTE — Assessment & Plan Note (Signed)
Stable, continue Protonix 

## 2019-02-13 NOTE — Assessment & Plan Note (Signed)
C/o worsened SOB in setting of CHF, no apparent s/s of fluid retention, continue Torsemide.

## 2019-02-13 NOTE — Progress Notes (Signed)
ANTICOAGULATION CONSULT NOTE - Initial Consult  Pharmacy Consult for heparin Indication: pulmonary embolus  Allergies  Allergen Reactions  . Sulfa Antibiotics Shortness Of Breath and Palpitations  . Sulfamethoxazole Shortness Of Breath and Palpitations  . Sulfur Shortness Of Breath and Palpitations    "Allergic," per MAR (although I believe they might've meant "Sulfa"??)  . Amoxicillin Rash  . Penicillin G Rash    Did it involve swelling of the face/tongue/throat, SOB, or low BP? Yes Did it involve sudden or severe rash/hives, skin peeling, or any reaction on the inside of your mouth or nose? Unk Did you need to seek medical attention at a hospital or doctor's office? Unk When did it last happen? "it was a long time ago" If all above answers are "NO", may proceed with cephalosporin use.   Marland Kitchen Penicillins Rash    Did it involve swelling of the face/tongue/throat, SOB, or low BP? Yes Did it involve sudden or severe rash/hives, skin peeling, or any reaction on the inside of your mouth or nose? Unk Did you need to seek medical attention at a hospital or doctor's office? Unk When did it last happen? "it was a long time ago" If all above answers are "NO", may proceed with cephalosporin use.   . Tape Other (See Comments)    SKIN IS VERY FRAGILE- PLEASE USE AN ALTERNATIVE TO TAPE, AS THE SKIN TEARS EASILY!!    Patient Measurements:   Heparin Dosing Weight: 44.6 kg  Vital Signs: Temp: 98.4 F (36.9 C) (12/29 1704) Temp Source: Oral (12/29 1704) BP: 102/80 (12/29 2246) Pulse Rate: 98 (12/29 2246)  Labs: Recent Labs    02/13/19 1659 02/13/19 1857 02/13/19 2037  HGB 10.4*  --  9.5*  HCT 32.2*  --  28.0*  PLT 327  --   --   CREATININE 2.20*  --   --   TROPONINIHS 13 13  --     Estimated Creatinine Clearance: 9 mL/min (A) (by C-G formula based on SCr of 2.2 mg/dL (H)).   Medical History: Past Medical History:  Diagnosis Date  . Arthritis   . Constipation   . COPD  (chronic obstructive pulmonary disease) ([place])   . Fatigue   . Frequent UTI   . GERD (gastroesophageal reflux disease)   . History of shingles 2007  . Hyperlipemia   . Hypertension   . Hypothyroidism   . Multiple allergies   . Osteoporosis   . Paget's carcinoma of the nipple ([place]) 03/16/2013  . Pneumonia   . Seasonal allergies   . Stroke [facility]) 2010   no residual  . Thyroid disease   . Uterine cancer ([facility])     Medications:  See medication history She was on eliquis 2.5 mg po bid, last dose 11:00 Assessment: 85 yo lady to start heparin for h/o PE.  Hg 10.4, PTLC 327 Goal of Therapy:  APTT 66-102 sec Heparin level 0.3-0.7 units/ml Monitor platelets by anticoagulation protocol: Yes   Plan:  Start heparin at 700 units/hr, no bolus Check aPTT and HL in 8 hours Daily HL, aPTT and CBC Monitor for bleeding complications  Derren Suydam Poteet 02/13/2019,11:11 PM

## 2019-02-13 NOTE — Assessment & Plan Note (Signed)
Worsened regardless O2 via [place], continue Spiriva, Advair, prn DuoNeb, O2, ED eval.

## 2019-02-13 NOTE — Progress Notes (Signed)
Pharmacy Antibiotic Note  Katherine [name] is a 85 y.o. female admitted on 02/13/2019 with pneumonia.  Pharmacy has been consulted for Cefepime dosing. CrCl <10 ml/min/  Plan: Start Cefepime 500 mg IV q24hr Monitor renal function, clinical status and C&S.    Temp (24hrs), Avg:98.1 F (36.7 C), Min:97.7 F (36.5 C), Max:98.4 F (36.9 C)  Recent Labs  Lab 02/13/19 1659 02/13/19 1900  WBC 11.3*  --   CREATININE 2.20*  --   LATICACIDVEN  --  0.9    Estimated Creatinine Clearance: 9 mL/min (A) (by C-G formula based on SCr of 2.2 mg/dL (H)).    Allergies  Allergen Reactions  . Sulfa Antibiotics Shortness Of Breath and Palpitations  . Sulfamethoxazole Shortness Of Breath and Palpitations  . Sulfur Shortness Of Breath and Palpitations    "Allergic," per MAR (although I believe they might've meant "Sulfa"??)  . Amoxicillin Rash  . Penicillin G Rash    Did it involve swelling of the face/tongue/throat, SOB, or low BP? Yes Did it involve sudden or severe rash/hives, skin peeling, or any reaction on the inside of your mouth or nose? Unk Did you need to seek medical attention at a hospital or doctor's office? Unk When did it last happen? "it was a long time ago" If all above answers are "NO", may proceed with cephalosporin use.   Marland Kitchen Penicillins Rash    Did it involve swelling of the face/tongue/throat, SOB, or low BP? Yes Did it involve sudden or severe rash/hives, skin peeling, or any reaction on the inside of your mouth or nose? Unk Did you need to seek medical attention at a hospital or doctor's office? Unk When did it last happen? "it was a long time ago" If all above answers are "NO", may proceed with cephalosporin use.   . Tape Other (See Comments)    SKIN IS VERY FRAGILE- PLEASE USE AN ALTERNATIVE TO TAPE, AS THE SKIN TEARS EASILY!!    Antimicrobials this admission: Cefepime 12/29 >>   Thank you for allowing pharmacy to be a part of this patient's care.  Alanda Slim,  PharmD, [facility] Clinical Pharmacist Please see AMION for all Pharmacists' Contact Phone Numbers 02/13/2019, 8:23 PM

## 2019-02-13 NOTE — Assessment & Plan Note (Signed)
Chronic, worsened SOB, Ed eval. Continue DuoNeb prn, Advair qd, Spiriva qd

## 2019-02-13 NOTE — ED Notes (Signed)
Heparin drip witnessed by Jori Moll, RN

## 2019-02-13 NOTE — Assessment & Plan Note (Signed)
Heart rate is not controlled, HR in 120-130 bpm, continue Amiodarone, Diltiazem. ED eval.

## 2019-02-13 NOTE — ED Triage Notes (Signed)
Pt here from [facility] [place] with c/o sob for about 3 days , pt is on 4 liters otc , no known covid exposure

## 2019-02-14 ENCOUNTER — Inpatient Hospital Stay (HOSPITAL_COMMUNITY): Payer: Medicare Other

## 2019-02-14 ENCOUNTER — Encounter (HOSPITAL_COMMUNITY): Payer: Self-pay | Admitting: Internal Medicine

## 2019-02-14 ENCOUNTER — Other Ambulatory Visit: Payer: Self-pay

## 2019-02-14 DIAGNOSIS — J441 Chronic obstructive pulmonary disease with (acute) exacerbation: Secondary | ICD-10-CM

## 2019-02-14 DIAGNOSIS — J189 Pneumonia, unspecified organism: Secondary | ICD-10-CM

## 2019-02-14 DIAGNOSIS — R918 Other nonspecific abnormal finding of lung field: Secondary | ICD-10-CM

## 2019-02-14 DIAGNOSIS — J9611 Chronic respiratory failure with hypoxia: Secondary | ICD-10-CM

## 2019-02-14 LAB — BASIC METABOLIC PANEL
Anion gap: 13 (ref 5–15)
BUN: 58 mg/dL — ABNORMAL HIGH (ref 8–23)
CO2: 26 mmol/L (ref 22–32)
Calcium: 9.1 mg/dL (ref 8.9–10.3)
Chloride: 96 mmol/L — ABNORMAL LOW (ref 98–111)
Creatinine, Ser: 1.98 mg/dL — ABNORMAL HIGH (ref 0.44–1.00)
GFR calc Af Amer: 24 mL/min — ABNORMAL LOW (ref >60)
GFR calc non Af Amer: 20 mL/min — ABNORMAL LOW (ref >60)
Glucose, Bld: 105 mg/dL — ABNORMAL HIGH (ref 70–99)
Potassium: 4.3 mmol/L (ref 3.5–5.1)
Sodium: 135 mmol/L (ref 135–145)

## 2019-02-14 LAB — HEPARIN LEVEL (UNFRACTIONATED): Heparin Unfractionated: >2.2 IU/mL — ABNORMAL HIGH (ref 0.30–0.70)

## 2019-02-14 LAB — CBC
HCT: 30.9 % — ABNORMAL LOW (ref 36.0–46.0)
Hemoglobin: 10.2 g/dL — ABNORMAL LOW (ref 12.0–15.0)
MCH: 32.2 pg (ref 26.0–34.0)
MCHC: 33 g/dL (ref 30.0–36.0)
MCV: 97.5 fL (ref 80.0–100.0)
Platelets: 314 10*3/uL (ref 150–400)
RBC: 3.17 MIL/uL — ABNORMAL LOW (ref 3.87–5.11)
RDW: 13.5 % (ref 11.5–15.5)
WBC: 9.4 10*3/uL (ref 4.0–10.5)
nRBC: 0 % (ref 0.0–0.2)

## 2019-02-14 LAB — SARS CORONAVIRUS 2 (TAT 6-24 HRS): SARS Coronavirus 2: NEGATIVE

## 2019-02-14 LAB — APTT: aPTT: 79 seconds — ABNORMAL HIGH (ref 24–36)

## 2019-02-14 LAB — MRSA PCR SCREENING: MRSA by PCR: NEGATIVE

## 2019-02-14 MED ORDER — TECHNETIUM TO 99M ALBUMIN AGGREGATED
1.5000 | Freq: Once | INTRAVENOUS | Status: AC | PRN
  Administered 2019-02-14: 1.5 via INTRAVENOUS

## 2019-02-14 MED ORDER — METHYLPREDNISOLONE SODIUM SUCC 40 MG IJ SOLR
40.0000 mg | Freq: Three times a day (TID) | INTRAMUSCULAR | Status: DC
  Administered 2019-02-14 – 2019-02-16 (×6): 40 mg via INTRAVENOUS
  Filled 2019-02-14 (×6): qty 1

## 2019-02-14 MED ORDER — SODIUM CHLORIDE 0.9 % IV SOLN
500.0000 mg | INTRAVENOUS | Status: DC
  Administered 2019-02-14 – 2019-02-15 (×2): 500 mg via INTRAVENOUS
  Filled 2019-02-14 (×3): qty 500

## 2019-02-14 MED ORDER — INFLUENZA VAC A&B SA ADJ QUAD 0.5 ML IM PRSY
0.5000 mL | PREFILLED_SYRINGE | INTRAMUSCULAR | Status: DC
  Filled 2019-02-14: qty 0.5

## 2019-02-14 MED ORDER — ENOXAPARIN SODIUM 40 MG/0.4ML ~~LOC~~ SOLN: 40.0000 mg | SUBCUTANEOUS | Status: DC

## 2019-02-14 MED ORDER — IPRATROPIUM-ALBUTEROL 0.5-2.5 (3) MG/3ML IN SOLN
3.0000 mL | Freq: Two times a day (BID) | RESPIRATORY_TRACT | Status: DC
  Administered 2019-02-14 – 2019-02-16 (×4): 3 mL via RESPIRATORY_TRACT
  Filled 2019-02-14 (×4): qty 3

## 2019-02-14 MED ORDER — ENOXAPARIN SODIUM 30 MG/0.3ML ~~LOC~~ SOLN
30.0000 mg | SUBCUTANEOUS | Status: DC
  Administered 2019-02-14 – 2019-02-16 (×2): 30 mg via SUBCUTANEOUS
  Filled 2019-02-14 (×2): qty 0.3

## 2019-02-14 MED ORDER — SODIUM CHLORIDE 0.9 % IV SOLN
1.0000 g | INTRAVENOUS | Status: DC
  Administered 2019-02-14 – 2019-02-16 (×3): 1 g via INTRAVENOUS
  Filled 2019-02-14: qty 10
  Filled 2019-02-14 (×2): qty 1

## 2019-02-14 MED ORDER — TECHNETIUM TC 99M DIETHYLENETRIAME-PENTAACETIC ACID
39.8000 | Freq: Once | INTRAVENOUS | Status: AC | PRN
  Administered 2019-02-14: 39.8 via RESPIRATORY_TRACT

## 2019-02-14 NOTE — Progress Notes (Signed)
PALLIATIVE NOTE:  Referral received for goals of care discussion. Patient is currently off the unit for procedure.   Palliative will attempt to see patient at a later date and time.   Detailed note and recommendations to follow once GOC has been completed.   Thank you for your referral and allowing Palliative to assist in Ms. Trant's care.   Alda Lea, AGPCNP-BC Palliative Medicine Team  Phone: [phone]  NO CHARGE

## 2019-02-14 NOTE — Progress Notes (Signed)
ANTICOAGULATION CONSULT NOTE - Initial Consult  Pharmacy Consult for heparin Indication: pulmonary embolus  Allergies  Allergen Reactions  . Sulfa Antibiotics Shortness Of Breath and Palpitations  . Sulfamethoxazole Shortness Of Breath and Palpitations  . Sulfur Shortness Of Breath and Palpitations    "Allergic," per MAR (although I believe they might've meant "Sulfa"??)  . Amoxicillin Rash  . Penicillin G Rash    Did it involve swelling of the face/tongue/throat, SOB, or low BP? Yes Did it involve sudden or severe rash/hives, skin peeling, or any reaction on the inside of your mouth or nose? Unk Did you need to seek medical attention at a hospital or doctor's office? Unk When did it last happen? "it was a long time ago" If all above answers are "NO", may proceed with cephalosporin use.   Marland Kitchen Penicillins Rash    Did it involve swelling of the face/tongue/throat, SOB, or low BP? Yes Did it involve sudden or severe rash/hives, skin peeling, or any reaction on the inside of your mouth or nose? Unk Did you need to seek medical attention at a hospital or doctor's office? Unk When did it last happen? "it was a long time ago" If all above answers are "NO", may proceed with cephalosporin use.   . Tape Other (See Comments)    SKIN IS VERY FRAGILE- PLEASE USE AN ALTERNATIVE TO TAPE, AS THE SKIN TEARS EASILY!!    Patient Measurements: Height: 4\' 7"  (139.7 cm) Weight: 91 lb 14.9 oz (41.7 kg) IBW/kg (Calculated) : 34 Heparin Dosing Weight: 44.6 kg  Vital Signs: Temp: 98.5 F (36.9 C) (12/30 0819) Temp Source: Oral (12/30 0819) BP: 127/42 (12/30 0832) Pulse Rate: 73 (12/30 0819)  Labs: Recent Labs    02/13/19 1659 02/13/19 1857 02/13/19 2037 02/14/19 0339 02/14/19 0859  HGB 10.4*  --  9.5* 10.2*  --   HCT 32.2*  --  28.0* 30.9*  --   PLT 327  --   --  314  --   APTT  --   --   --   --  79*  HEPARINUNFRC  --   --   --   --  >2.20*  CREATININE 2.20*  --   --  1.98*  --    TROPONINIHS 13 13  --   --   --     Estimated Creatinine Clearance: 9.1 mL/min (A) (by C-G formula based on SCr of 1.98 mg/dL (H)).  Assessment: 85 yo lady to start heparin for h/o PE.   Pending VQ scan  aptt therapeutic - hep lvl inaccurate d/t recent apixaban use pta  Goal of Therapy:  APTT 66-102 sec Heparin level 0.3-0.7 units/ml Monitor platelets by anticoagulation protocol: Yes   Plan:  Continue heparin 700 units/hr Daily aPTT HL CBC  Barth Kirks, PharmD, BCPS, BCCCP Clinical Pharmacist [phone]  Please check AMION for all [place] numbers  02/14/2019 10:51 AM

## 2019-02-14 NOTE — Progress Notes (Signed)
Patient ID: Katherine [name], female   DOB: 1918/02/13, 85 y.o.   MRN: [id number]  PROGRESS NOTE    Katherine [name]  WNI:[id number] DOB: 13-Mar-1917 DOA: 02/13/2019 PCP: [name], MD   Brief Narrative:  85 year old female with history of COPD on 2 to 4 L oxygen at baseline, known lung nodule, CVA unspecified, uterine cancer, hypothyroidism, hypertension, grade 2 diastolic dysfunction, frequent UTIs, prior history of thoracentesis in April 2020 for right-sided pleural effusion presented with worsening shortness of breath.  COVID-19 testing was negative.  Chest x-ray revealed right upper lobe and right lower lobe airspace disease and small pleural effusion along with mild leukocytosis.  She was started on IV antibiotics.  Assessment & Plan:   Probable community-acquired pneumonia Chronic hypoxic respiratory failure COPD with possible mild exacerbation Pleural effusion -Patient is on 2 to 4 L oxygen via nasal cannula at home.  Currently requiring the same amount of oxygen -Currently on cefepime: We will switch to Rocephin and Zithromax.  Will also start intravenous Solu-Medrol. -CT of the chest showed left upper lobe density with concern for infection along with pleural effusion on the right side.  Unclear if the patient needs thoracentesis at this time.  Enlarging right upper lobe spiculated lung mass -CT chest showed enlarging lung mass up to 3.5 cm in the right upper lobe with cavitation concerning for carcinoma -We will request palliative care evaluation for goals of care discussion.  We will hold off on further investigation till then.  Acute on chronic diastolic CHF -Continue intravenous Lasix.  Strict input and output.  Daily weights.  Monitor creatinine.  Acute kidney injury on chronic kidney disease stage IIIb -Improving.  Creatinine 1.98 today.  Follow creatinine.  Renal ultrasound was negative for hydronephrosis.  Generalized deconditioning -PT eval.  Palliative care  consult  DVT prophylaxis: DC heparin drip.  Start Lovenox Code Status: DNR  family Communication: Spoke to patient at bedside Disposition Plan: Depends on clinical outcome  Consultants: None  Procedures: None  Antimicrobials:  Anti-infectives (From admission, onward)   Start     Dose/Rate Route Frequency Ordered Stop   02/13/19 2030  ceFEPIme (MAXIPIME) 500 mg in dextrose 5 % 50 mL IVPB     500 mg 100 mL/hr over 30 Minutes Intravenous Every 24 hours 02/13/19 2021     02/13/19 1845  cefTRIAXone (ROCEPHIN) 1 g in sodium chloride 0.9 % 100 mL IVPB     1 g 200 mL/hr over 30 Minutes Intravenous  Once 02/13/19 1830 02/13/19 2007   02/13/19 1845  doxycycline (VIBRA-TABS) tablet 100 mg     100 mg Oral  Once 02/13/19 1830 02/13/19 1921       Subjective: Patient seen and examined at bedside.  Elderly female lying in bed.  Feels slightly better.  Still short of breath.  No overnight fever or vomiting reported.  Objective: Vitals:   02/14/19 0406 02/14/19 0747 02/14/19 0819 02/14/19 0832  BP:   (!) 121/39 (!) 127/42  Pulse:  69 73   Resp:  17    Temp:   98.5 F (36.9 C)   TempSrc:   Oral   SpO2:  98% 100%   Weight: 41.7 kg     Height: 4\' 7"  (1.397 m)      No intake or output data in the 24 hours ending 02/14/19 1155 Filed Weights   02/14/19 0406  Weight: 41.7 kg    Examination:  General exam: Appears calm and comfortable.  Elderly female lying  in bed. Respiratory system: Bilateral decreased breath sounds at bases with some scattered crackles Cardiovascular system: S1 & S2 heard, Rate controlled Gastrointestinal system: Abdomen is nondistended, soft and nontender. Normal bowel sounds heard. Extremities: No cyanosis, clubbing, edema  Central nervous system: Alert and oriented. No focal neurological deficits. Moving extremities Skin: No rashes, lesions or ulcers Psychiatry: Flat affect.   Data Reviewed: I have personally reviewed following labs and imaging  studies  CBC: Recent Labs  Lab 02/13/19 1659 02/13/19 2037 02/14/19 0339  WBC 11.3*  --  9.4  HGB 10.4* 9.5* 10.2*  HCT 32.2* 28.0* 30.9*  MCV 99.7  --  97.5  PLT 327  --  509   Basic Metabolic Panel: Recent Labs  Lab 02/13/19 1659 02/13/19 2029 02/13/19 2037 02/14/19 0339  NA 134*  --  133* 135  K 5.3*  --  4.7 4.3  CL 94*  --   --  96*  CO2 27  --   --  26  GLUCOSE 126*  --   --  105*  BUN 67*  --   --  58*  CREATININE 2.20*  --   --  1.98*  CALCIUM 9.3  --   --  9.1  MG  --  2.2  --   --   PHOS  --  3.9  --   --    GFR: Estimated Creatinine Clearance: 9.1 mL/min (A) (by C-G formula based on SCr of 1.98 mg/dL (H)). Liver Function Tests: Recent Labs  Lab 02/13/19 1659  AST 17  ALT 17  ALKPHOS 89  BILITOT 0.5  PROT 6.7  ALBUMIN 3.2*   No results for input(s): LIPASE, AMYLASE in the last 168 hours. No results for input(s): AMMONIA in the last 168 hours. Coagulation Profile: No results for input(s): INR, PROTIME in the last 168 hours. Cardiac Enzymes: No results for input(s): CKTOTAL, CKMB, CKMBINDEX, TROPONINI in the last 168 hours. BNP (last 3 results) No results for input(s): PROBNP in the last 8760 hours. HbA1C: No results for input(s): HGBA1C in the last 72 hours. CBG: No results for input(s): GLUCAP in the last 168 hours. Lipid Profile: No results for input(s): CHOL, HDL, LDLCALC, TRIG, CHOLHDL, LDLDIRECT in the last 72 hours. Thyroid Function Tests: Recent Labs    02/13/19 2029  TSH 0.452   Anemia Panel: No results for input(s): VITAMINB12, FOLATE, FERRITIN, TIBC, IRON, RETICCTPCT in the last 72 hours. Sepsis Labs: Recent Labs  Lab 02/13/19 1900  LATICACIDVEN 0.9    Recent Results (from the past 240 hour(s))  Culture, blood (routine x 2)     Status: None (Preliminary result)   Collection Time: 02/13/19  7:00 PM   Specimen: BLOOD RIGHT ARM  Result Value Ref Range Status   Specimen Description BLOOD RIGHT ARM  Final   Special Requests    Final    BOTTLES DRAWN AEROBIC AND ANAEROBIC Blood Culture adequate volume   Culture   Final    NO GROWTH < 24 HOURS Performed at [facility], [place] [address], [place], [place] [address]    Report Status PENDING  Incomplete  Culture, blood (routine x 2)     Status: None (Preliminary result)   Collection Time: 02/13/19  7:15 PM   Specimen: BLOOD  Result Value Ref Range Status   Specimen Description BLOOD LEFT ANTECUBITAL  Final   Special Requests   Final    BOTTLES DRAWN AEROBIC AND ANAEROBIC Blood Culture adequate volume   Culture  Final    NO GROWTH < 24 HOURS Performed at [facility], [place] [address], [place], [place] [address]    Report Status PENDING  Incomplete  SARS CORONAVIRUS 2 (TAT 6-24 HRS) Nasopharyngeal Nasopharyngeal Swab     Status: None   Collection Time: 02/13/19  7:15 PM   Specimen: Nasopharyngeal Swab  Result Value Ref Range Status   SARS Coronavirus 2 NEGATIVE NEGATIVE Final    Comment: (NOTE) SARS-CoV-2 target nucleic acids are NOT DETECTED. The SARS-CoV-2 RNA is generally detectable in upper and lower respiratory specimens during the acute phase of infection. Negative results do not preclude SARS-CoV-2 infection, do not rule out co-infections with other pathogens, and should not be used as the sole basis for treatment or other patient management decisions. Negative results must be combined with clinical observations, patient history, and epidemiological information. The expected result is Negative. Fact Sheet for Patients: SugarRoll.be Fact Sheet for Healthcare Providers: https://www.woods-mathews.com/ This test is not yet approved or cleared by the [place] FDA and  has been authorized for detection and/or diagnosis of SARS-CoV-2 by FDA under an Emergency Use Authorization (EUA). This EUA will remain  in effect (meaning this test can be used) for the duration of the COVID-19 declaration under  Section 56 4(b)(1) of the Act, 21 U.S.C. section 360bbb-3(b)(1), unless the authorization is terminated or revoked sooner. Performed at [facility], [place] [address] [place], [place] [address]   MRSA PCR Screening     Status: None   Collection Time: 02/13/19  8:26 PM   Specimen: Nasal Mucosa; Nasopharyngeal  Result Value Ref Range Status   MRSA by PCR NEGATIVE NEGATIVE Final    Comment:        The GeneXpert MRSA Assay (FDA approved for NASAL specimens only), is one component of a comprehensive MRSA colonization surveillance program. It is not intended to diagnose MRSA infection nor to guide or monitor treatment for MRSA infections. Performed at [facility], [place] [address], [place], [place] [address]          Radiology Studies: CT Chest Wo Contrast  Result Date: 02/14/2019 CLINICAL DATA:  Shortness of breath EXAM: CT CHEST WITHOUT CONTRAST TECHNIQUE: Multidetector CT imaging of the chest was performed following the standard protocol without IV contrast. COMPARISON:  Chest x-ray 02/13/2019, CT chest 04/29/2018 FINDINGS: Cardiovascular: Limited evaluation without intravenous contrast. Nonaneurysmal aorta. Moderate to marked aortic atherosclerosis. Coronary vascular calcification. Borderline cardiomegaly. No significant pericardial effusion. Mediastinum/Nodes: Midline trachea. Stable 8 mm calcified nodule right lobe of thyroid, no further follow-up recommended. Redemonstrated mediastinal adenopathy. Low right paratracheal region lymph node measures 2.1 cm compared with 2.5 cm previously. Subcarinal node measures 1 cm compared with 16 mm previously. Small right cardio phrenic nodes are unchanged. Lungs/Pleura: Small-moderate right pleural effusion. Scarring at the right lung apex. Interval enlargement of spiculated right upper lobe lung mass, now with small amount of cavitation. Mass measures approximately 3.5 by 2.7 by 2.6 cm, compared with 1.3 x 1.4 cm previously. Mass  appears contiguous with linear airspace disease extending to the right hilar region. Bronchiectasis within the right upper lobe, right middle lobe and bilateral lower lobes with bronchial wall thickening. Small scattered foci of slightly nodular appearing airspace disease in the left upper lobe. Mild diffuse septal thickening on the right could indicate mild asymmetric edema. Upper Abdomen: No acute abnormality.  Splenic granuloma. Musculoskeletal: Degenerative changes of the spine. No acute osseous abnormality. IMPRESSION: 1. Interval enlargement of spiculated right upper  lobe lung mass, now measuring up to 3.5 cm, previously 1.4 cm. The mass now also demonstrates mild cavitation and remains concerning for carcinoma. Increasing right parahilar soft tissue density, which may be contiguous with the spiculated mass. Mediastinal adenopathy persists but does not appear progressed. 2. Small moderate right pleural effusion. Interim finding of small scattered ill-defined nodular foci of density, most notable in the left upper lobe, possibly reflecting acute respiratory infection. 3. Emphysema Aortic Atherosclerosis (ICD10-I70.0) and Emphysema (ICD10-J43.9). Electronically Signed   By: Donavan Foil M.D.   On: 02/14/2019 01:07   US Renal  Result Date: 02/13/2019 CLINICAL DATA:  Acute kidney injury. EXAM: RENAL / URINARY TRACT ULTRASOUND COMPLETE COMPARISON:  None. FINDINGS: Right Kidney: Renal measurements: 8.7 x 3.0 x 2.0 cm = volume: 29 mL. Increased echogenicity. No mass or hydronephrosis visualized. Left Kidney: Renal measurements: 8.1 x 3.1 x 2.4 cm = volume: 32 mL. Increased echogenicity. No mass or hydronephrosis visualized. Bladder: Multiple bladder diverticula noted.  No wall thickening. Other: None. IMPRESSION: 1. Bilateral renal atrophy and increased cortical echogenicity, consistent with underlying medical renal disease. 2. Multiple bladder diverticula. Electronically Signed   By: Titus Dubin M.D.   On:  02/13/2019 21:15   DG Chest Port 1 View  Result Date: 02/13/2019 CLINICAL DATA:  Shortness of breath for 3 days EXAM: PORTABLE CHEST 1 VIEW COMPARISON:  05/22/2018 FINDINGS: There is a small right pleural effusion. There is a trace left pleural effusion. There bilateral emphysematous changes. There is right upper and right lower lung interstitial and alveolar airspace disease. There is no pneumothorax. The heart mediastinum are stable. There is no acute osseous abnormality. IMPRESSION: 1. Small right pleural effusion. Right upper and right lower lung airspace disease which may reflect atypical pulmonary edema versus multilobar pneumonia. Electronically Signed   By: Kathreen Devoid   On: 02/13/2019 18:14        Scheduled Meds: . amiodarone  200 mg Oral Daily  . budesonide (PULMICORT) nebulizer solution  0.25 mg Nebulization BID  . ceFEPime (MAXIPIME) IV  500 mg Intravenous Q24H  . diltiazem  120 mg Oral Daily  . furosemide  40 mg Intravenous Q12H  . [START ON 02/15/2019] influenza vaccine adjuvanted  0.5 mL Intramuscular Tomorrow-1000  . ipratropium-albuterol  3 mL Nebulization BID   Continuous Infusions: . heparin 700 Units/hr (02/13/19 2339)          Aline August, MD [facility] 02/14/2019, 11:55 AM

## 2019-02-14 NOTE — Evaluation (Signed)
Physical Therapy Evaluation Patient Details Name: Katherine [name] MRN: [id number] DOB: Mar 05, 1917 Today's Date: 02/14/2019   History of Present Illness  Pt is 85 year old female with history of COPD on 2 to 4 L oxygen at baseline, known lung nodule, CVA unspecified, uterine cancer, hypothyroidism, hypertension, grade 2 diastolic dysfunction, frequent UTIs, prior history of thoracentesis in April 2020 for right-sided pleural effusion presented with worsening shortness of breath.  Admitted with PNE, resp failure, COPD, and pleural effusion.  Clinical Impression  Pt admitted with above diagnosis. Pt was able to transfers and ambulate 12'x2 with min A.  She fatigued easily but VSS. Pt was from a nursing home and was no longer getting physical therapy.  She is presenting at below her baseline (could transfer independently).  Will benefit from PT.  Pt currently with functional limitations due to the deficits listed below (see PT Problem List). Pt will benefit from skilled PT to increase their independence and safety with mobility to allow discharge to the venue listed below.       Follow Up Recommendations SNF    Equipment Recommendations  None recommended by PT    Recommendations for Other Services       Precautions / Restrictions Precautions Precautions: Fall      Mobility  Bed Mobility Overal bed mobility: Needs Assistance Bed Mobility: Supine to Sit;Sit to Supine     Supine to sit: Min assist Sit to supine: Min assist   General bed mobility comments: increased time; also min A for toielting ADLs  Transfers Overall transfer level: Needs assistance Equipment used: Rolling walker (2 wheeled) Transfers: Sit to/from Stand Sit to Stand: Min assist         General transfer comment: increased time; cues for safe hand placement; performed x 2  Ambulation/Gait Ambulation/Gait assistance: Min assist Gait Distance (Feet): 12 Feet Assistive device: Rolling walker (2 wheeled) Gait  Pattern/deviations: Decreased stride length;Trunk flexed Gait velocity: decreased   General Gait Details: 12'x2 (to bathroom and back); fatigued easily; required assist with RW  Stairs            Wheelchair Mobility    Modified Rankin (Stroke Patients Only)       Balance Overall balance assessment: Needs assistance Sitting-balance support: Bilateral upper extremity supported;Feet supported Sitting balance-Leahy Scale: Fair     Standing balance support: Bilateral upper extremity supported;During functional activity Standing balance-Leahy Scale: Poor                               Pertinent Vitals/Pain Pain Assessment: No/denies pain    Home Living Family/patient expects to be discharged to:: Skilled nursing facility                 Additional Comments: Pt at [facility]    Prior Function Level of Independence: Needs assistance   Gait / Transfers Assistance Needed: ambulatory with rollator when working with PT, otherwise just transfers to w/c  ADL's / Homemaking Assistance Needed: Assistance for bathing and dressing; ccould toilet independently by rolling w/c to bathroom and transferring        Hand Dominance        Extremity/Trunk Assessment   Upper Extremity Assessment Upper Extremity Assessment: RUE deficits/detail;LUE deficits/detail RUE Deficits / Details: ROM WFL; MMT 4-/5 LUE Deficits / Details: ROM WFL; MMT 4-/5    Lower Extremity Assessment Lower Extremity Assessment: LLE deficits/detail;RLE deficits/detail RLE Deficits / Details: ROM WFL; MMT 4-/5; auidable crepitus  in knee and ankle (baseline) LLE Deficits / Details: ROM WFL; MMT 4+/5    Cervical / Trunk Assessment Cervical / Trunk Assessment: Kyphotic  Communication   Communication: HOH  Cognition Arousal/Alertness: Awake/alert Behavior During Therapy: WFL for tasks assessed/performed Overall Cognitive Status: Within Functional Limits for tasks assessed                                         General Comments General comments (skin integrity, edema, etc.): vss on 2 LPM O2    Exercises     Assessment/Plan    PT Assessment Patient needs continued PT services  PT Problem List Decreased strength;Decreased mobility;Decreased range of motion;Decreased knowledge of precautions;Decreased activity tolerance;Cardiopulmonary status limiting activity;Decreased balance;Decreased knowledge of use of DME       PT Treatment Interventions DME instruction;Therapeutic activities;Gait training;Therapeutic exercise;Patient/family education;Balance training;Functional mobility training    PT Goals (Current goals can be found in the Care Plan section)  Acute Rehab PT Goals Patient Stated Goal: return to [facility] at d/c; feel better PT Goal Formulation: With patient Time For Goal Achievement: 02/28/19 Potential to Achieve Goals: Good    Frequency Min 2X/week   Barriers to discharge        Co-evaluation               AM-PAC PT "6 Clicks" Mobility  Outcome Measure Help needed turning from your back to your side while in a flat bed without using bedrails?: A Little Help needed moving from lying on your back to sitting on the side of a flat bed without using bedrails?: A Little Help needed moving to and from a bed to a chair (including a wheelchair)?: A Little Help needed standing up from a chair using your arms (e.g., wheelchair or bedside chair)?: A Little Help needed to walk in hospital room?: A Lot Help needed climbing 3-5 steps with a railing? : Total 6 Click Score: 15    End of Session Equipment Utilized During Treatment: Gait belt;Oxygen Activity Tolerance: Patient limited by fatigue Patient left: in bed;with call bell/phone within reach;with bed alarm set Nurse Communication: Mobility status PT Visit Diagnosis: Unsteadiness on feet (R26.81);Muscle weakness (generalized) (M62.81)    Time: 7341-9379 PT Time Calculation  (min) (ACUTE ONLY): 32 min   Charges:   PT Evaluation $PT Eval Low Complexity: 1 Low PT Treatments $Therapeutic Activity: 8-22 mins        Maggie Font, PT Acute Rehab Services Pager [phone] [facility] [phone] [facility] [phone]   Karlton Lemon 02/14/2019, 1:45 PM

## 2019-02-14 NOTE — ED Notes (Signed)
ED TO INPATIENT HANDOFF REPORT  ED Nurse Name and Phone #: William Hamburger RN [phone]  S Name/Age/Gender Katherine [name] 85 y.o. female Room/Bed: H011C/H011C  Code Status   Code Status: DNR  Home/SNF/Other Home Patient oriented to: self, place, time and situation Is this baseline? No   Triage Complete: Triage complete  Chief Complaint Acute on chronic respiratory failure ([place]) [J96.20]  Triage Note [facility] with c/o sob for about 3 days , pt is on 4 liters otc , no known covid exposure      Allergies Allergies  Allergen Reactions  . Sulfa Antibiotics Shortness Of Breath and Palpitations  . Sulfamethoxazole Shortness Of Breath and Palpitations  . Sulfur Shortness Of Breath and Palpitations    "Allergic," per MAR (although I believe they might've meant "Sulfa"??)  . Amoxicillin Rash  . Penicillin G Rash    Did it involve swelling of the face/tongue/throat, SOB, or low BP? Yes Did it involve sudden or severe rash/hives, skin peeling, or any reaction on the inside of your mouth or nose? Unk Did you need to seek medical attention at a hospital or doctor's office? Unk When did it last happen? "it was a long time ago" If all above answers are "NO", may proceed with cephalosporin use.   Marland Kitchen Penicillins Rash    Did it involve swelling of the face/tongue/throat, SOB, or low BP? Yes Did it involve sudden or severe rash/hives, skin peeling, or any reaction on the inside of your mouth or nose? Unk Did you need to seek medical attention at a hospital or doctor's office? Unk When did it last happen? "it was a long time ago" If all above answers are "NO", may proceed with cephalosporin use.   . Tape Other (See Comments)    SKIN IS VERY FRAGILE- PLEASE USE AN ALTERNATIVE TO TAPE, AS THE SKIN TEARS EASILY!!    Level of Care/Admitting Diagnosis ED Disposition    ED Disposition Condition Comment   Admit  Hospital Area: [place] [[id number]]  Level of  Care: Telemetry Medical [[id number]]  Covid Evaluation: Asymptomatic Screening Protocol (No Symptoms)  Diagnosis: Acute on chronic respiratory failure [facility]) [[id number]]  Admitting Physician: Shirlean Mylar  Attending Physician: Dana Allan I [[id number]]  Estimated length of stay: 3 - 4 days  Certification:: I certify this patient will need inpatient services for at least 2 midnights       B Medical/Surgery History Past Medical History:  Diagnosis Date  . Arthritis   . Constipation   . COPD (chronic obstructive pulmonary disease) ([place])   . Fatigue   . Frequent UTI   . GERD (gastroesophageal reflux disease)   . History of shingles 2007  . Hyperlipemia   . Hypertension   . Hypothyroidism   . Multiple allergies   . Osteoporosis   . Paget's carcinoma of the nipple ([place]) 03/16/2013  . Pneumonia   . Seasonal allergies   . Stroke [facility]) 2010   no residual  . Thyroid disease   . Uterine cancer [facility])    Past Surgical History:  Procedure Laterality Date  . ABDOMINAL HYSTERECTOMY    . BREAST LUMPECTOMY     left breast  . CAROTID ENDARTERECTOMY Left June 10, 2008   CE  . EYE SURGERY     cataracts removed bilaterally  . TONSILLECTOMY       A IV Location/Drains/Wounds Patient Lines/Drains/Airways Status   Active Line/Drains/Airways    Name:  Placement date:   Placement time:   Site:   Days:   Peripheral IV 02/13/19 Left Arm   02/13/19    --    Arm   1   External Urinary Catheter   05/20/18    0700    --   270          Intake/Output Last 24 hours No intake or output data in the 24 hours ending 02/14/19 0012  Labs/Imaging Results for orders placed or performed during the hospital encounter of 02/13/19 (from the past 48 hour(s))  CBC     Status: Abnormal   Collection Time: 02/13/19  4:59 PM  Result Value Ref Range   WBC 11.3 (H) 4.0 - 10.5 K/uL   RBC 3.23 (L) 3.87 - 5.11 MIL/uL   Hemoglobin 10.4 (L) 12.0 - 15.0 g/dL   HCT 32.2 (L) 36.0 - 46.0 %   MCV 99.7 80.0  - 100.0 fL   MCH 32.2 26.0 - 34.0 pg   MCHC 32.3 30.0 - 36.0 g/dL   RDW 13.5 11.5 - 15.5 %   Platelets 327 150 - 400 K/uL   nRBC 0.0 0.0 - 0.2 %    Comment: Performed at [facility], [place] [address] [place], [place] [address]  Comprehensive metabolic panel     Status: Abnormal   Collection Time: 02/13/19  4:59 PM  Result Value Ref Range   Sodium 134 (L) 135 - 145 mmol/L   Potassium 5.3 (H) 3.5 - 5.1 mmol/L   Chloride 94 (L) 98 - 111 mmol/L   CO2 27 22 - 32 mmol/L   Glucose, Bld 126 (H) 70 - 99 mg/dL   BUN 67 (H) 8 - 23 mg/dL   Creatinine, Ser 2.20 (H) 0.44 - 1.00 mg/dL   Calcium 9.3 8.9 - 10.3 mg/dL   Total Protein 6.7 6.5 - 8.1 g/dL   Albumin 3.2 (L) 3.5 - 5.0 g/dL   AST 17 15 - 41 U/L   ALT 17 0 - 44 U/L   Alkaline Phosphatase 89 38 - 126 U/L   Total Bilirubin 0.5 0.3 - 1.2 mg/dL   GFR calc non Af Amer 18 (L) >60 mL/min   GFR calc Af Amer 21 (L) >60 mL/min   Anion gap 13 5 - 15    Comment: Performed at [facility], [place] [address] [place], [place] [address]  Troponin I (High Sensitivity)     Status: None   Collection Time: 02/13/19  4:59 PM  Result Value Ref Range   Troponin I (High Sensitivity) 13 <18 ng/L    Comment: (NOTE) Elevated high sensitivity troponin I (hsTnI) values and significant  changes across serial measurements may suggest ACS but many other  chronic and acute conditions are known to elevate hsTnI results.  Refer to the "Links" section for chest pain algorithms and additional  guidance. Performed at [facility], [place] [address] [place], [place] [address]   D-dimer, quantitative (not at [facility])     Status: Abnormal   Collection Time: 02/13/19  4:59 PM  Result Value Ref Range   D-Dimer, Quant 1.08 (H) 0.00 - 0.50 ug/mL-FEU    Comment: (NOTE) At the manufacturer cut-off of 0.50 ug/mL FEU, this assay has been documented to exclude PE with a sensitivity and negative predictive value of 97 to 99%.  At this time, this assay has not been  approved by the FDA to exclude DVT/VTE. Results should be correlated with clinical presentation.  Performed at [facility], [place] [address], [place], [place] [address]   POC SARS Coronavirus 2 Ag-ED - Nasal Swab (BD Veritor Kit)     Status: None   Collection Time: 02/13/19  5:56 PM  Result Value Ref Range   SARS Coronavirus 2 Ag NEGATIVE NEGATIVE    Comment: (NOTE) SARS-CoV-2 antigen NOT DETECTED.  Negative results are presumptive.  Negative results do not preclude SARS-CoV-2 infection and should not be used as the sole basis for treatment or other patient management decisions, including infection  control decisions, particularly in the presence of clinical signs and  symptoms consistent with COVID-19, or in those who have been in contact with the virus.  Negative results must be combined with clinical observations, patient history, and epidemiological information. The expected result is Negative. Fact Sheet for Patients: PodPark.tn Fact Sheet for Healthcare Providers: GiftContent.is This test is not yet approved or cleared by the [place] FDA and  has been authorized for detection and/or diagnosis of SARS-CoV-2 by FDA under an Emergency Use Authorization (EUA).  This EUA will remain in effect (meaning this test can be used) for the duration of  the COVID-19 de claration under Section 564(b)(1) of the Act, 21 U.S.C. section 360bbb-3(b)(1), unless the authorization is terminated or revoked sooner.   Troponin I (High Sensitivity)     Status: None   Collection Time: 02/13/19  6:57 PM  Result Value Ref Range   Troponin I (High Sensitivity) 13 <18 ng/L    Comment: (NOTE) Elevated high sensitivity troponin I (hsTnI) values and significant  changes across serial measurements may suggest ACS but many other  chronic and acute conditions are known to elevate hsTnI results.  Refer to the "Links" section for chest pain  algorithms and additional  guidance. Performed at [facility], [place] [address], [place], [place] [address]   Lactic acid, plasma     Status: None   Collection Time: 02/13/19  7:00 PM  Result Value Ref Range   Lactic Acid, Venous 0.9 0.5 - 1.9 mmol/L    Comment: Performed at [place] [address], [place], [place] [address]  Brain natriuretic peptide     Status: Abnormal   Collection Time: 02/13/19  7:36 PM  Result Value Ref Range   B Natriuretic Peptide 278.6 (H) 0.0 - 100.0 pg/mL    Comment: Performed at [place] [address] [place], [place] [address]  Sodium, urine, random     Status: None   Collection Time: 02/13/19  8:15 PM  Result Value Ref Range   Sodium, Ur 33 mmol/L    Comment: Performed at [place] [address], [place], [place] [address]  Magnesium     Status: None   Collection Time: 02/13/19  8:29 PM  Result Value Ref Range   Magnesium 2.2 1.7 - 2.4 mg/dL    Comment: Performed at [place] [address] [place], [place] [address]  Phosphorus     Status: None   Collection Time: 02/13/19  8:29 PM  Result Value Ref Range   Phosphorus 3.9 2.5 - 4.6 mg/dL    Comment: Performed at [place] [address] [place], [place] [address]  TSH     Status: None   Collection Time: 02/13/19  8:29 PM  Result Value Ref Range   TSH 0.452 0.350 - 4.500 uIU/mL    Comment: Performed by a 3rd Generation assay with  a functional sensitivity of <=0.01 uIU/mL. Performed at [facility], [place] [address], [place], [place] [address]   I-STAT 7, (LYTES, BLD GAS, ICA, H+H)     Status: Abnormal   Collection Time: 02/13/19  8:37 PM  Result Value Ref Range   pH, Arterial 7.520 (H) 7.350 - 7.450   pCO2 arterial 37.6 32.0 - 48.0 mmHg   pO2, Arterial 119.0 (H) 83.0 - 108.0 mmHg   Bicarbonate 30.7 (H) 20.0 - 28.0 mmol/L   TCO2 32 22 - 32 mmol/L   O2 Saturation 99.0 %   Acid-Base Excess 7.0 (H) 0.0 - 2.0 mmol/L   Sodium 133  (L) 135 - 145 mmol/L   Potassium 4.7 3.5 - 5.1 mmol/L   Calcium, Ion 1.19 1.15 - 1.40 mmol/L   HCT 28.0 (L) 36.0 - 46.0 %   Hemoglobin 9.5 (L) 12.0 - 15.0 g/dL   Patient temperature 98.4 F    Collection site RADIAL, ALLEN'S TEST ACCEPTABLE    Drawn by RT    Sample type ARTERIAL   Urinalysis, Complete w Microscopic     Status: None   Collection Time: 02/13/19  8:49 PM  Result Value Ref Range   Color, Urine YELLOW YELLOW   APPearance CLEAR CLEAR   Specific Gravity, Urine 1.011 1.005 - 1.030   pH 7.0 5.0 - 8.0   Glucose, UA NEGATIVE NEGATIVE mg/dL   Hgb urine dipstick NEGATIVE NEGATIVE   Bilirubin Urine NEGATIVE NEGATIVE   Ketones, ur NEGATIVE NEGATIVE mg/dL   Protein, ur NEGATIVE NEGATIVE mg/dL   Nitrite NEGATIVE NEGATIVE   Leukocytes,Ua NEGATIVE NEGATIVE    Comment: Performed at [facility], [address] [place], [place] [address]  Cortisol     Status: None   Collection Time: 02/13/19  9:25 PM  Result Value Ref Range   Cortisol, Plasma 11.4 ug/dL    Comment: (NOTE) AM    6.7 - 22.6 ug/dL PM   <10.0       ug/dL Performed at [facility] [address], [place], [place] [address]    US Renal  Result Date: 02/13/2019 CLINICAL DATA:  Acute kidney injury. EXAM: RENAL / URINARY TRACT ULTRASOUND COMPLETE COMPARISON:  None. FINDINGS: Right Kidney: Renal measurements: 8.7 x 3.0 x 2.0 cm = volume: 29 mL. Increased echogenicity. No mass or hydronephrosis visualized. Left Kidney: Renal measurements: 8.1 x 3.1 x 2.4 cm = volume: 32 mL. Increased echogenicity. No mass or hydronephrosis visualized. Bladder: Multiple bladder diverticula noted.  No wall thickening. Other: None. IMPRESSION: 1. Bilateral renal atrophy and increased cortical echogenicity, consistent with underlying medical renal disease. 2. Multiple bladder diverticula. Electronically Signed   By: Titus Dubin M.D.   On: 02/13/2019 21:15   DG Chest Port 1 View  Result Date: 02/13/2019 CLINICAL DATA:   Shortness of breath for 3 days EXAM: PORTABLE CHEST 1 VIEW COMPARISON:  05/22/2018 FINDINGS: There is a small right pleural effusion. There is a trace left pleural effusion. There bilateral emphysematous changes. There is right upper and right lower lung interstitial and alveolar airspace disease. There is no pneumothorax. The heart mediastinum are stable. There is no acute osseous abnormality. IMPRESSION: 1. Small right pleural effusion. Right upper and right lower lung airspace disease which may reflect atypical pulmonary edema versus multilobar pneumonia. Electronically Signed   By: Kathreen Devoid   On: 02/13/2019 18:14    Pending Labs Unresulted Labs (From admission, onward)    Start     Ordered  02/15/19 0500  Heparin level (unfractionated)  Daily,   R     02/13/19 2310   02/15/19 0500  CBC  Daily,   R     02/13/19 2310   02/15/19 0500  APTT  Daily,   R     02/13/19 2310   02/14/19 0900  Heparin level (unfractionated)  Once-Timed,   STAT     02/13/19 2310   02/14/19 0900  APTT  Once-Timed,   STAT     02/13/19 2310   02/14/19 8786  Basic metabolic panel  Tomorrow morning,   R     02/13/19 2014   02/14/19 0500  CBC  Tomorrow morning,   R     02/13/19 2014   02/13/19 2015  Culture, sputum-assessment  Once,   R     02/13/19 2014   02/13/19 2015  Blood gas, arterial  Once,   R     02/13/19 2014   02/13/19 1943  MRSA PCR Screening  ONCE - STAT,   STAT     02/13/19 1942   02/13/19 1844  SARS CORONAVIRUS 2 (TAT 6-24 HRS) Nasopharyngeal Nasopharyngeal Swab  (Tier 3 (TAT 6-24 hrs))  Once,   STAT    Question Answer Comment  Is this test for diagnosis or screening Diagnosis of ill patient   Symptomatic for COVID-19 as defined by CDC Yes   Date of Symptom Onset 02/10/2019   Hospitalized for COVID-19 Yes   Admitted to ICU for COVID-19 No   Previously tested for COVID-19 Yes   Resident in a congregate (group) care setting Yes   Employed in healthcare setting No   Pregnant No      02/13/19  1843   02/13/19 1830  Culture, blood (routine x 2)  BLOOD CULTURE X 2,   STAT     02/13/19 1830          Vitals/Pain Today's Vitals   02/13/19 2000 02/13/19 2015 02/13/19 2150 02/13/19 2246  BP: (!) 123/56 105/61 112/61 102/80  Pulse: 91 (!) 103 98 98  Resp: 15 12 17    Temp:      TempSrc:      SpO2: 100% 99% 98% 99%  PainSc:   0-No pain 0-No pain    Isolation Precautions No active isolations  Medications Medications  amiodarone (PACERONE) tablet 200 mg (200 mg Oral Given 02/13/19 2338)  diltiazem (CARDIZEM CD) 24 hr capsule 120 mg (120 mg Oral Given 02/13/19 2344)  budesonide (PULMICORT) nebulizer solution 0.25 mg (has no administration in time range)  ipratropium-albuterol (DUONEB) 0.5-2.5 (3) MG/3ML nebulizer solution 3 mL (has no administration in time range)  ipratropium-albuterol (DUONEB) 0.5-2.5 (3) MG/3ML nebulizer solution 3 mL (has no administration in time range)  ceFEPIme (MAXIPIME) 500 mg in dextrose 5 % 50 mL IVPB (0 mg Intravenous Stopped 02/13/19 2323)  furosemide (LASIX) injection 40 mg (has no administration in time range)  heparin ADULT infusion 100 units/mL (25000 units/233m sodium chloride 0.45%) (700 Units/hr Intravenous New Bag/Given 02/13/19 2339)  cefTRIAXone (ROCEPHIN) 1 g in sodium chloride 0.9 % 100 mL IVPB (0 g Intravenous Stopped 02/13/19 2007)  doxycycline (VIBRA-TABS) tablet 100 mg (100 mg Oral Given 02/13/19 1921)    Mobility walks with person assist     Focused Assessments Cardiac Assessment Handoff:    Lab Results  Component Value Date   TROPONINI <0.03 05/19/2018   Lab Results  Component Value Date   DDIMER 1.08 (H) 02/13/2019   Does the Patient currently have chest  pain? No  , Pulmonary Assessment Handoff:  Lung sounds:   O2 Device: Room Air        R Recommendations: See Admitting Provider Note  Report given to:   Additional Notes:

## 2019-02-14 NOTE — ED Notes (Signed)
Patient transported to CT 

## 2019-02-14 NOTE — Progress Notes (Signed)
Pt returned from [place]. A/O, no change in mental status, denies pain. Bed in low position, alarms ar on, call bell in reach. Continue to monitor.

## 2019-02-14 NOTE — Plan of Care (Signed)
  Problem: Education: Goal: Knowledge of General Education information will improve Description: Including pain rating scale, medication(s)/side effects and non-pharmacologic comfort measures Outcome: Progressing   Problem: Clinical Measurements: Goal: Will remain free from infection Outcome: Progressing Goal: Diagnostic test results will improve Outcome: Progressing Goal: Respiratory complications will improve Outcome: Progressing   Problem: Activity: Goal: Risk for activity intolerance will decrease Outcome: Progressing   Problem: Skin Integrity: Goal: Risk for impaired skin integrity will decrease Outcome: Progressing

## 2019-02-15 DIAGNOSIS — N184 Chronic kidney disease, stage 4 (severe): Secondary | ICD-10-CM

## 2019-02-15 DIAGNOSIS — Z66 Do not resuscitate: Secondary | ICD-10-CM

## 2019-02-15 DIAGNOSIS — Z515 Encounter for palliative care: Secondary | ICD-10-CM

## 2019-02-15 DIAGNOSIS — Z7189 Other specified counseling: Secondary | ICD-10-CM

## 2019-02-15 LAB — BASIC METABOLIC PANEL
Anion gap: 12 (ref 5–15)
BUN: 52 mg/dL — ABNORMAL HIGH (ref 8–23)
CO2: 29 mmol/L (ref 22–32)
Calcium: 9.2 mg/dL (ref 8.9–10.3)
Chloride: 97 mmol/L — ABNORMAL LOW (ref 98–111)
Creatinine, Ser: 2.09 mg/dL — ABNORMAL HIGH (ref 0.44–1.00)
GFR calc Af Amer: 22 mL/min — ABNORMAL LOW (ref >60)
GFR calc non Af Amer: 19 mL/min — ABNORMAL LOW (ref >60)
Glucose, Bld: 155 mg/dL — ABNORMAL HIGH (ref 70–99)
Potassium: 4.3 mmol/L (ref 3.5–5.1)
Sodium: 138 mmol/L (ref 135–145)

## 2019-02-15 LAB — MAGNESIUM: Magnesium: 2.2 mg/dL (ref 1.7–2.4)

## 2019-02-15 LAB — CBC
HCT: 29.5 % — ABNORMAL LOW (ref 36.0–46.0)
Hemoglobin: 9.6 g/dL — ABNORMAL LOW (ref 12.0–15.0)
MCH: 31.9 pg (ref 26.0–34.0)
MCHC: 32.5 g/dL (ref 30.0–36.0)
MCV: 98 fL (ref 80.0–100.0)
Platelets: 311 10*3/uL (ref 150–400)
RBC: 3.01 MIL/uL — ABNORMAL LOW (ref 3.87–5.11)
RDW: 13.4 % (ref 11.5–15.5)
WBC: 2.9 10*3/uL — ABNORMAL LOW (ref 4.0–10.5)
nRBC: 0 % (ref 0.0–0.2)

## 2019-02-15 LAB — SARS CORONAVIRUS 2 (TAT 6-24 HRS): SARS Coronavirus 2: NEGATIVE

## 2019-02-15 LAB — APTT: aPTT: 38 seconds — ABNORMAL HIGH (ref 24–36)

## 2019-02-15 NOTE — Evaluation (Signed)
Clinical/Bedside Swallow Evaluation Patient Details  Name: Katherine [name] MRN: [id number] Date of Birth: [birth year]  Today's Date: 02/15/2019 Time: SLP Start Time (ACUTE ONLY): 1027 SLP Stop Time (ACUTE ONLY): 1044 SLP Time Calculation (min) (ACUTE ONLY): 17 min  Past Medical History:  Past Medical History:  Diagnosis Date  . Arthritis   . Constipation   . COPD (chronic obstructive pulmonary disease) ([place])   . Fatigue   . Frequent UTI   . GERD (gastroesophageal reflux disease)   . History of shingles 2007  . Hyperlipemia   . Hypertension   . Hypothyroidism   . Multiple allergies   . Osteoporosis   . Paget's carcinoma of the nipple ([place]) 03/16/2013  . Pneumonia   . Seasonal allergies   . Stroke [facility]) 2010   no residual  . Thyroid disease   . Uterine cancer [facility])    Past Surgical History:  Past Surgical History:  Procedure Laterality Date  . ABDOMINAL HYSTERECTOMY    . BREAST LUMPECTOMY     left breast  . CAROTID ENDARTERECTOMY Left June 10, 2008   CE  . EYE SURGERY     cataracts removed bilaterally  . TONSILLECTOMY     HPI:  85 year old female with history of COPD on 2 to 4 L oxygen at baseline, known lung nodule, CVA unspecified, uterine cancer, hypothyroidism, hypertension, grade 2 diastolic dysfunction, frequent UTIs, prior history of thoracentesis in April 2020 for right-sided pleural effusion presented with worsening shortness of breath.  COVID-19 testing was negative.  Chest x-ray revealed right upper lobe and right lower lobe airspace disease and small pleural effusion along with mild leukocytosis.  Dx CAP, enlarging RUL spiculated lung mass, concerning for carcinoma. Palliative care consulted.    Assessment / Plan / Recommendation Clinical Impression  Pt presents with normal oropharyngeal swallow with adequate mastication, brisk swallow response, no s/s of aspiration. Pt is good historian, reports no hx of deficits.  Oral mechanism exam is unremarkable. Continue  current diet; no SLP f/u is needed.  SLP Visit Diagnosis: Dysphagia, unspecified (R13.10)    Aspiration Risk  No limitations    Diet Recommendation   regular solids, thin liquids  Medication Administration: Whole meds with liquid    Other  Recommendations Oral Care Recommendations: Oral care BID   Follow up Recommendations None      Frequency and Duration            Prognosis        Swallow Study   General HPI: 85 year old female with history of COPD on 2 to 4 L oxygen at baseline, known lung nodule, CVA unspecified, uterine cancer, hypothyroidism, hypertension, grade 2 diastolic dysfunction, frequent UTIs, prior history of thoracentesis in April 2020 for right-sided pleural effusion presented with worsening shortness of breath.  COVID-19 testing was negative.  Chest x-ray revealed right upper lobe and right lower lobe airspace disease and small pleural effusion along with mild leukocytosis.  Dx CAP, enlarging RUL spiculated lung mass, concerning for carcinoma. Palliative care consulted.  Type of Study: Bedside Swallow Evaluation Previous Swallow Assessment: no Diet Prior to this Study: Regular;Thin liquids Temperature Spikes Noted: No Respiratory Status: Nasal cannula History of Recent Intubation: No Behavior/Cognition: Alert;Cooperative;Pleasant mood Oral Cavity Assessment: Within Functional Limits Oral Care Completed by SLP: No Oral Cavity - Dentition: Adequate natural dentition Vision: Functional for self-feeding Self-Feeding Abilities: Able to feed self Patient Positioning: Upright in bed Baseline Vocal Quality: Normal Volitional Cough: Strong Volitional Swallow: Able to elicit  Oral/Motor/Sensory Function Overall Oral Motor/Sensory Function: Within functional limits   Ice Chips Ice chips: Within functional limits   Thin Liquid Thin Liquid: Within functional limits    Nectar Thick Nectar Thick Liquid: Not tested   Honey Thick Honey Thick Liquid: Not tested    Puree Puree: Within functional limits   Solid     Solid: Within functional limits      Katherine [name] 02/15/2019,10:44 AM Estill Bamberg L. Tivis Ringer, [place] Office number [phone] Pager [phone]

## 2019-02-15 NOTE — Evaluation (Signed)
Occupational Therapy Evaluation Patient Details Name: Katherine [name] MRN: [id number] DOB: 06-19-1917 Today's Date: 02/15/2019    History of Present Illness Pt is 85 year old female with history of COPD on 2 to 4 L oxygen at baseline, known lung nodule, CVA unspecified, uterine cancer, hypothyroidism, hypertension, grade 2 diastolic dysfunction, frequent UTIs, prior history of thoracentesis in April 2020 for right-sided pleural effusion presented with worsening shortness of breath.  Admitted with PNE, resp failure, COPD, and pleural effusion.   Clinical Impression   PTA [facility], using w/c, with assist for bathing and dressing. At time of eval, pt is on 4L of O2. She is a min A for bed mobility at min A for squat pivot transfers. Pt is overall generally weak and deconditioned, fatigued from single stand pivot transfer. Pt needing max A to don socks, states this is close to baseline. She will benefit from SNF follow up at d/c to progress BADL safely. Will continue to follow per POC listed below.    Follow Up Recommendations  SNF;Supervision/Assistance - 24 hour    Equipment Recommendations  None recommended by OT    Recommendations for Other Services       Precautions / Restrictions Precautions Precautions: Fall Restrictions Weight Bearing Restrictions: No      Mobility Bed Mobility Overal bed mobility: Needs Assistance Bed Mobility: Supine to Sit;Sit to Supine     Supine to sit: Min assist     General bed mobility comments: min A to pull trunk upright to sitting  Transfers Overall transfer level: Needs assistance Equipment used: None Transfers: Squat Pivot Transfers     Squat pivot transfers: Min guard     General transfer comment: squat pivot to recliner that was simulated as w/c    Balance Overall balance assessment: Needs assistance Sitting-balance support: Bilateral upper extremity supported;Feet supported Sitting balance-Leahy Scale: Fair      Standing balance support: Bilateral upper extremity supported;During functional activity Standing balance-Leahy Scale: Poor Standing balance comment: reliant on external support from therapist                           ADL either performed or assessed with clinical judgement   ADL Overall ADL's : Needs assistance/impaired Eating/Feeding: Set up;Sitting   Grooming: Set up;Sitting   Upper Body Bathing: Set up;Sitting   Lower Body Bathing: Moderate assistance;Sit to/from stand;Sitting/lateral leans   Upper Body Dressing : Set up;Sitting   Lower Body Dressing: Sit to/from stand;Sitting/lateral leans;Maximal assistance Lower Body Dressing Details (indicate cue type and reason): to don socks Toilet Transfer: Minimal assistance;Squat-pivot;BSC Toilet Transfer Details (indicate cue type and reason): simulated with recliner, squat pivot transfer Toileting- Clothing Manipulation and Hygiene: Minimal assistance;Sit to/from stand   Tub/ Shower Transfer: Minimal assistance;Squat-pivot;Shower seat   Functional mobility during ADLs: Minimal assistance General ADL Comments: pt limited by cardiopulmonary limitations and generalized weakness     Vision Baseline Vision/History: Wears glasses Wears Glasses: At all times Patient Visual Report: No change from baseline       Perception     Praxis      Pertinent Vitals/Pain Pain Assessment: No/denies pain     Hand Dominance     Extremity/Trunk Assessment Upper Extremity Assessment Upper Extremity Assessment: Generalized weakness   Lower Extremity Assessment Lower Extremity Assessment: Defer to PT evaluation       Communication Communication Communication: HOH   Cognition Arousal/Alertness: Awake/alert Behavior During Therapy: [facility] for tasks assessed/performed Overall Cognitive  Status: Within Functional Limits for tasks assessed                                     General Comments        Exercises     Shoulder Instructions      Home Living Family/patient expects to be discharged to:: Skilled nursing facility                                 Additional Comments: Pt at [facility]      Prior Functioning/Environment Level of Independence: Needs assistance  Gait / Transfers Assistance Needed: states she mostly uses w/c ADL's / Homemaking Assistance Needed: Assistance for bathing and dressing; ccould toilet independently by rolling w/c to bathroom and transferring            OT Problem List: Decreased strength;Decreased knowledge of use of DME or AE;Decreased activity tolerance;Cardiopulmonary status limiting activity;Impaired balance (sitting and/or standing)      OT Treatment/Interventions: Self-care/ADL training;Therapeutic exercise;Patient/family education;Balance training;Energy conservation;Therapeutic activities;DME and/or AE instruction    OT Goals(Current goals can be found in the care plan section) Acute Rehab OT Goals Patient Stated Goal: d/c to facility with higher care OT Goal Formulation: With patient Time For Goal Achievement: 03/01/19 Potential to Achieve Goals: Good  OT Frequency: Min 2X/week   Barriers to D/C:            Co-evaluation              AM-PAC OT "6 Clicks" Daily Activity     Outcome Measure Help from another person eating meals?: A Little Help from another person taking care of personal grooming?: A Little Help from another person toileting, which includes using toliet, bedpan, or urinal?: A Little Help from another person bathing (including washing, rinsing, drying)?: A Lot Help from another person to put on and taking off regular upper body clothing?: A Little Help from another person to put on and taking off regular lower body clothing?: A Lot 6 Click Score: 16   End of Session Equipment Utilized During Treatment: Gait belt Nurse Communication: Mobility status  Activity Tolerance: Patient tolerated  treatment well Patient left: in chair;with call bell/phone within reach;with chair alarm set  OT Visit Diagnosis: Unsteadiness on feet (R26.81);Other abnormalities of gait and mobility (R26.89);Muscle weakness (generalized) (M62.81)                Time: 4268-3419 OT Time Calculation (min): 25 min Charges:  OT General Charges $OT Visit: 1 Visit OT Evaluation $OT Eval Moderate Complexity: 1 Mod OT Treatments $Self Care/Home Management : 8-22 mins  Zenovia Jarred, MSOT, OTR/L Behavioral Health OT/ Acute Relief OT [facility] Office: [place] 02/15/2019, 5:15 PM

## 2019-02-15 NOTE — TOC Initial Note (Signed)
Transition of Care [facility]) - Initial/Assessment Note    Patient Details  Name: Katherine [name] MRN: [id number] Date of Birth: 12-17-1917  [facility]) CM/SW Contact:    Benard Halsted, [place] Phone Number: 02/15/2019, 2:53 PM  Clinical Narrative:                 CSW received consult for patient to return to SNF at discharge. CSW spoke with [place] at [facility], where patient resides under long term care. She confirmed that patient will be able to return at discharge with hospice services ([facility] is the agency they use). She is requesting an updated COVID test, which MD has ordered. CSW confirmed plan with patient's niece, [name]. She requests [facility] for transport. CSW sent referral to [facility], [place], to initiate services. [name] provided CSW with patient's Medicaid number, which just came in the mail: [id number] M. Emailed to [employer].   Expected Discharge Plan: Skilled Nursing Facility Barriers to Discharge: Continued Medical Work up   Patient Goals and CMS Choice Patient states their goals for this hospitalization and ongoing recovery are:: Comfort CMS Medicare.gov Compare Post Acute Care list provided to:: Patient Represenative (must comment)(Lisa, niece) Choice offered to / list presented to : (Niece, [name])  Expected Discharge Plan and Services Expected Discharge Plan: [place] In-house Referral: Clinical Social Work   Post Acute Care Choice: Hospice Living arrangements for the past 2 months: [place]                                      Prior Living Arrangements/Services Living arrangements for the past 2 months: [place] Lives with:: Facility Resident Patient language and need for interpreter reviewed:: Yes Do you feel safe going back to the place where you live?: Yes      Need for Family Participation in Patient Care: Yes (Comment) Care giver support system in place?: Yes  (comment)   Criminal Activity/Legal Involvement Pertinent to Current Situation/Hospitalization: No - Comment as needed  Activities of Daily Living Home Assistive Devices/Equipment: Wheelchair ADL Screening (condition at time of admission) Patient's cognitive ability adequate to safely complete daily activities?: Yes Is the patient deaf or have difficulty hearing?: No Does the patient have difficulty seeing, even when wearing glasses/contacts?: No Does the patient have difficulty concentrating, remembering, or making decisions?: No Patient able to express need for assistance with ADLs?: Yes Does the patient have difficulty dressing or bathing?: Yes Independently performs ADLs?: No Does the patient have difficulty walking or climbing stairs?: Yes Weakness of Legs: Both Weakness of Arms/Hands: Both  Permission Sought/Granted Permission sought to share information with : Facility Sport and exercise psychologist, Family Supports Permission granted to share information with : No  Share Information with NAME: [name]     Permission granted to share info w Relationship: Niece  Permission granted to share info w Contact Information: [phone]  Emotional Assessment Appearance:: Appears stated age Attitude/Demeanor/Rapport: Unable to Assess Affect (typically observed): Unable to Assess Orientation: : Oriented to Self, Oriented to Place, Oriented to  Time, Oriented to Situation Alcohol / Substance Use: Not Applicable Psych Involvement: No (comment)  Admission diagnosis:  AKI (acute kidney injury) ([place]) [N17.9] Acute on chronic respiratory failure ([place]) [J96.20] Patient Active Problem List   Diagnosis Date Noted  . Acute on chronic respiratory failure ([place]) 02/13/2019  . Nodule of upper lobe of right lung 02/01/2019  .  Generalized osteoarthritis of multiple sites 02/01/2019  . Chest pain of uncertain etiology 76/73/4193  . Epistaxis 12/26/2018  . Allergic rhinitis 12/26/2018  . Right groin pain  12/08/2018  . TIA (transient ischemic attack) 09/12/2018  . Chronic pain of right knee 09/12/2018  . Slow transit constipation 08/29/2018  . GERD (gastroesophageal reflux disease) 08/29/2018  . Skin tag of anus 08/29/2018  . Light headedness 08/10/2018  . Acute respiratory failure with hypoxia and hypercapnia ([place]) 05/20/2018  . Pleural effusion due to CHF (congestive heart failure) ([place]) 05/19/2018  . Diastolic CHF ([place]) 79/03/4095  . Fatigue 04/25/2018  . Atrial fibrillation ([place]) 03/17/2018  . Chronic anemia 03/17/2018  . Kidney disease, chronic, stage IV (severe, EGFR 15-29 ml/min) ([place]) 03/17/2018  . Breast cancer ([place]) 03/16/2013  . Paget's disease and intraductal carcinoma of left breast ([place]) 03/16/2013  . Occlusion and stenosis of carotid artery without mention of cerebral infarction 01/06/2012  . Atypical mycobacterial disease 08/20/2011  . Hypothyroidism 07/24/2011  . Hyperlipidemia 07/24/2011  . Hypertension 07/24/2011  . Community acquired pneumonia of right lung 07/23/2011  . COPD (chronic obstructive pulmonary disease) ([place]) 07/23/2011   PCP:  [name], MD Pharmacy:  No Pharmacies Listed    Social Determinants of Health (SDOH) Interventions    Readmission Risk Interventions Readmission Risk Prevention Plan 05/22/2018 05/02/2018 04/28/2018  Transportation Screening Complete - Complete  PCP or Specialist Appt within 3-5 Days - - Not Complete  Not Complete comments - - Will check closer to discharge.  [place] or Home Care Consult - - Not Complete  Social Work Consult for [place] Planning/Counseling - - Complete  Palliative Care Screening - - Not Applicable  Medication Review (RN Care Manager) - - Referral to Pharmacy  PCP or Specialist appointment within 3-5 days of discharge Not Complete - -  [place]RI or [place] Not Complete Not Complete -  HRI or Home Care Consult Pt Refusal Comments (No Data) Going to SNF -  SW Recovery Care/Counseling Consult  Complete Complete -  Palliative Care Screening Not Applicable Not Applicable -  Skilled Nursing Facility Complete Complete -  Some recent data might be hidden

## 2019-02-15 NOTE — Progress Notes (Signed)
Patient ID: Katherine [name], female   DOB: 1917-05-16, 85 y.o.   MRN: [id number]  PROGRESS NOTE    Katherine [name]  VVO:[id number] DOB: 09/16/1917 DOA: 02/13/2019 PCP: [name], MD   Brief Narrative:  85 year old female with history of COPD on 2 to 4 L oxygen at baseline, known lung nodule, CVA unspecified, uterine cancer, hypothyroidism, hypertension, grade 2 diastolic dysfunction, frequent UTIs, prior history of thoracentesis in April 2020 for right-sided pleural effusion presented with worsening shortness of breath.  COVID-19 testing was negative.  Chest x-ray revealed right upper lobe and right lower lobe airspace disease and small pleural effusion along with mild leukocytosis.  She was started on IV antibiotics.  Assessment & Plan:   Probable community-acquired pneumonia Chronic hypoxic respiratory failure COPD with possible mild exacerbation Pleural effusion -Patient is on 2 to 4 L oxygen via nasal cannula at home.  Currently requiring 2 L oxygen intermittently. -Continue Rocephin and Zithromax.  Continue IV Solu-Medrol. -CT of the chest showed left upper lobe density with concern for infection along with pleural effusion on the right side.  Unclear if the patient needs thoracentesis at this time. -VQ scan was negative for PE  Enlarging right upper lobe spiculated lung mass -CT chest showed enlarging lung mass up to 3.5 cm in the right upper lobe with cavitation concerning for carcinoma -Palliative care evaluation pending.  We will hold off on further investigation till then.  Acute on chronic diastolic CHF -Creatinine trending up.  Hold Lasix today.  Strict input and output.  Daily weights.  Monitor creatinine.  Acute kidney injury on chronic kidney disease stage 4 -Baseline creatinine around 1.6-1.9.  Creatinine 2.09 today.  Follow creatinine.  Renal ultrasound was negative for hydronephrosis.  Generalized deconditioning -PT eval.  Palliative care consult pending.  DVT  prophylaxis: Lovenox Code Status: DNR  family Communication: Spoke to niece/Lisa cheek on 02/15/2019 on phone Disposition Plan: Back to rehab facility in 2 to 3 days  Consultants: Palliative care  Procedures: None  Antimicrobials:  Anti-infectives (From admission, onward)   Start     Dose/Rate Route Frequency Ordered Stop   02/14/19 1215  cefTRIAXone (ROCEPHIN) 1 g in sodium chloride 0.9 % 100 mL IVPB     1 g 200 mL/hr over 30 Minutes Intravenous Every 24 hours 02/14/19 1207     02/14/19 1215  azithromycin (ZITHROMAX) 500 mg in sodium chloride 0.9 % 250 mL IVPB     500 mg 250 mL/hr over 60 Minutes Intravenous Every 24 hours 02/14/19 1207     02/13/19 2030  ceFEPIme (MAXIPIME) 500 mg in dextrose 5 % 50 mL IVPB  Status:  Discontinued     500 mg 100 mL/hr over 30 Minutes Intravenous Every 24 hours 02/13/19 2021 02/14/19 1207   02/13/19 1845  cefTRIAXone (ROCEPHIN) 1 g in sodium chloride 0.9 % 100 mL IVPB     1 g 200 mL/hr over 30 Minutes Intravenous  Once 02/13/19 1830 02/13/19 2007   02/13/19 1845  doxycycline (VIBRA-TABS) tablet 100 mg     100 mg Oral  Once 02/13/19 1830 02/13/19 1921       Subjective: Patient seen and examined at bedside.  Feels slightly better.  Still complains of shortness of breath.  No overnight fever, nausea, vomiting. Objective: Vitals:   02/14/19 1711 02/14/19 1945 02/14/19 2253 02/15/19 0752  BP: (!) 112/48  (!) 149/58 (!) 131/56  Pulse: 74  76 74  Resp:   20   Temp: 97.8  F (36.6 C)  97.8 F (36.6 C) 98.2 F (36.8 C)  TempSrc: Oral   Oral  SpO2: 92% 94% 98% 95%  Weight:      Height:        Intake/Output Summary (Last 24 hours) at 02/15/2019 0800 Last data filed at 02/15/2019 0600 Gross per 24 hour  Intake 135 ml  Output 300 ml  Net -165 ml   Filed Weights   02/14/19 0406  Weight: 41.7 kg    Examination:  General exam: Appears calm and comfortable.  Elderly female lying in bed.  No acute distress.  Hard of hearing. Respiratory  system: Bilateral decreased breath sounds at bases with scattered rales.  No wheezing  cardiovascular system: Rate controlled, S1-S2 heard Gastrointestinal system: Abdomen is nondistended, soft and nontender. Normal bowel sounds heard. Extremities: No cyanosis; trace lower extremity edema Central nervous system: Alert and oriented. No focal neurological deficits. Moving extremities Skin: No rashes or ulcers Psychiatry: Has flat affect   Data Reviewed: I have personally reviewed following labs and imaging studies  CBC: Recent Labs  Lab 02/13/19 1659 02/13/19 2037 02/14/19 0339 02/15/19 0353  WBC 11.3*  --  9.4 2.9*  HGB 10.4* 9.5* 10.2* 9.6*  HCT 32.2* 28.0* 30.9* 29.5*  MCV 99.7  --  97.5 98.0  PLT 327  --  314 683   Basic Metabolic Panel: Recent Labs  Lab 02/13/19 1659 02/13/19 2029 02/13/19 2037 02/14/19 0339 02/15/19 0353  NA 134*  --  133* 135 138  K 5.3*  --  4.7 4.3 4.3  CL 94*  --   --  96* 97*  CO2 27  --   --  26 29  GLUCOSE 126*  --   --  105* 155*  BUN 67*  --   --  58* 52*  CREATININE 2.20*  --   --  1.98* 2.09*  CALCIUM 9.3  --   --  9.1 9.2  MG  --  2.2  --   --  2.2  PHOS  --  3.9  --   --   --    GFR: Estimated Creatinine Clearance: 8.6 mL/min (A) (by C-G formula based on SCr of 2.09 mg/dL (H)). Liver Function Tests: Recent Labs  Lab 02/13/19 1659  AST 17  ALT 17  ALKPHOS 89  BILITOT 0.5  PROT 6.7  ALBUMIN 3.2*   No results for input(s): LIPASE, AMYLASE in the last 168 hours. No results for input(s): AMMONIA in the last 168 hours. Coagulation Profile: No results for input(s): INR, PROTIME in the last 168 hours. Cardiac Enzymes: No results for input(s): CKTOTAL, CKMB, CKMBINDEX, TROPONINI in the last 168 hours. BNP (last 3 results) No results for input(s): PROBNP in the last 8760 hours. HbA1C: No results for input(s): HGBA1C in the last 72 hours. CBG: No results for input(s): GLUCAP in the last 168 hours. Lipid Profile: No results  for input(s): CHOL, HDL, LDLCALC, TRIG, CHOLHDL, LDLDIRECT in the last 72 hours. Thyroid Function Tests: Recent Labs    02/13/19 2029  TSH 0.452   Anemia Panel: No results for input(s): VITAMINB12, FOLATE, FERRITIN, TIBC, IRON, RETICCTPCT in the last 72 hours. Sepsis Labs: Recent Labs  Lab 02/13/19 1900  LATICACIDVEN 0.9    Recent Results (from the past 240 hour(s))  Culture, blood (routine x 2)     Status: None (Preliminary result)   Collection Time: 02/13/19  7:00 PM   Specimen: BLOOD RIGHT ARM  Result Value Ref Range Status  Specimen Description BLOOD RIGHT ARM  Final   Special Requests   Final    BOTTLES DRAWN AEROBIC AND ANAEROBIC Blood Culture adequate volume   Culture   Final    NO GROWTH < 24 HOURS Performed at [facility], [address], [place], [place] [address]    Report Status PENDING  Incomplete  Culture, blood (routine x 2)     Status: None (Preliminary result)   Collection Time: 02/13/19  7:15 PM   Specimen: BLOOD  Result Value Ref Range Status   Specimen Description BLOOD LEFT ANTECUBITAL  Final   Special Requests   Final    BOTTLES DRAWN AEROBIC AND ANAEROBIC Blood Culture adequate volume   Culture   Final    NO GROWTH < 24 HOURS Performed at [facility], [place] [address], [place], [place] [address]    Report Status PENDING  Incomplete  SARS CORONAVIRUS 2 (TAT 6-24 HRS) Nasopharyngeal Nasopharyngeal Swab     Status: None   Collection Time: 02/13/19  7:15 PM   Specimen: Nasopharyngeal Swab  Result Value Ref Range Status   SARS Coronavirus 2 NEGATIVE NEGATIVE Final    Comment: (NOTE) SARS-CoV-2 target nucleic acids are NOT DETECTED. The SARS-CoV-2 RNA is generally detectable in upper and lower respiratory specimens during the acute phase of infection. Negative results do not preclude SARS-CoV-2 infection, do not rule out co-infections with other pathogens, and should not be used as the sole basis for treatment or other patient  management decisions. Negative results must be combined with clinical observations, patient history, and epidemiological information. The expected result is Negative. Fact Sheet for Patients: SugarRoll.be Fact Sheet for Healthcare Providers: https://www.woods-mathews.com/ This test is not yet approved or cleared by the [place] FDA and  has been authorized for detection and/or diagnosis of SARS-CoV-2 by FDA under an Emergency Use Authorization (EUA). This EUA will remain  in effect (meaning this test can be used) for the duration of the COVID-19 declaration under Section 56 4(b)(1) of the Act, 21 U.S.C. section 360bbb-3(b)(1), unless the authorization is terminated or revoked sooner. Performed at [facility], [place] [address] [place], [place] [address]   MRSA PCR Screening     Status: None   Collection Time: 02/13/19  8:26 PM   Specimen: Nasal Mucosa; Nasopharyngeal  Result Value Ref Range Status   MRSA by PCR NEGATIVE NEGATIVE Final    Comment:        The GeneXpert MRSA Assay (FDA approved for NASAL specimens only), is one component of a comprehensive MRSA colonization surveillance program. It is not intended to diagnose MRSA infection nor to guide or monitor treatment for MRSA infections. Performed at [facility], [place] [address], [place], [place] [address]          Radiology Studies: CT Chest Wo Contrast  Result Date: 02/14/2019 CLINICAL DATA:  Shortness of breath EXAM: CT CHEST WITHOUT CONTRAST TECHNIQUE: Multidetector CT imaging of the chest was performed following the standard protocol without IV contrast. COMPARISON:  Chest x-ray 02/13/2019, CT chest 04/29/2018 FINDINGS: Cardiovascular: Limited evaluation without intravenous contrast. Nonaneurysmal aorta. Moderate to marked aortic atherosclerosis. Coronary vascular calcification. Borderline cardiomegaly. No significant pericardial effusion. Mediastinum/Nodes:  Midline trachea. Stable 8 mm calcified nodule right lobe of thyroid, no further follow-up recommended. Redemonstrated mediastinal adenopathy. Low right paratracheal region lymph node measures 2.1 cm compared with 2.5 cm previously. Subcarinal node measures 1 cm compared with 16 mm previously. Small right cardio phrenic nodes  are unchanged. Lungs/Pleura: Small-moderate right pleural effusion. Scarring at the right lung apex. Interval enlargement of spiculated right upper lobe lung mass, now with small amount of cavitation. Mass measures approximately 3.5 by 2.7 by 2.6 cm, compared with 1.3 x 1.4 cm previously. Mass appears contiguous with linear airspace disease extending to the right hilar region. Bronchiectasis within the right upper lobe, right middle lobe and bilateral lower lobes with bronchial wall thickening. Small scattered foci of slightly nodular appearing airspace disease in the left upper lobe. Mild diffuse septal thickening on the right could indicate mild asymmetric edema. Upper Abdomen: No acute abnormality.  Splenic granuloma. Musculoskeletal: Degenerative changes of the spine. No acute osseous abnormality. IMPRESSION: 1. Interval enlargement of spiculated right upper lobe lung mass, now measuring up to 3.5 cm, previously 1.4 cm. The mass now also demonstrates mild cavitation and remains concerning for carcinoma. Increasing right parahilar soft tissue density, which may be contiguous with the spiculated mass. Mediastinal adenopathy persists but does not appear progressed. 2. Small moderate right pleural effusion. Interim finding of small scattered ill-defined nodular foci of density, most notable in the left upper lobe, possibly reflecting acute respiratory infection. 3. Emphysema Aortic Atherosclerosis (ICD10-I70.0) and Emphysema (ICD10-J43.9). Electronically Signed   By: Donavan Foil M.D.   On: 02/14/2019 01:07   US Renal  Result Date: 02/13/2019 CLINICAL DATA:  Acute kidney injury. EXAM:  RENAL / URINARY TRACT ULTRASOUND COMPLETE COMPARISON:  None. FINDINGS: Right Kidney: Renal measurements: 8.7 x 3.0 x 2.0 cm = volume: 29 mL. Increased echogenicity. No mass or hydronephrosis visualized. Left Kidney: Renal measurements: 8.1 x 3.1 x 2.4 cm = volume: 32 mL. Increased echogenicity. No mass or hydronephrosis visualized. Bladder: Multiple bladder diverticula noted.  No wall thickening. Other: None. IMPRESSION: 1. Bilateral renal atrophy and increased cortical echogenicity, consistent with underlying medical renal disease. 2. Multiple bladder diverticula. Electronically Signed   By: Titus Dubin M.D.   On: 02/13/2019 21:15   NM Pulmonary Perf and Vent  Result Date: 02/14/2019 CLINICAL DATA:  Evaluate for pulmonary embolus.  History of COPD. EXAM: NUCLEAR MEDICINE VENTILATION - PERFUSION LUNG SCAN TECHNIQUE: Ventilation images were obtained in multiple projections using inhaled aerosol Tc-37m DTPA. Perfusion images were obtained in multiple projections after intravenous injection of Tc-61m MAA. RADIOPHARMACEUTICALS:  39.8 mCi of Tc-36m DTPA aerosol inhalation and 1.5 mCi Tc19m MAA IV COMPARISON:  CT chest 02/14/2019 FINDINGS: Ventilation: Photopenic defect within the right upper lobe corresponds to right upper lobe lung mass identified on recent chest CT. Non uniform soft tissue attenuation artifact is identified on the lateral projection images as well as the anterior oblique images. Perfusion: No wedge shaped peripheral perfusion defects to suggest acute pulmonary embolism. Photopenic defect within the right upper lobe corresponds to the ventilation and CT abnormality. IMPRESSION: 1. No evidence for acute pulmonary embolus. Electronically Signed   By: Kerby Moors M.D.   On: 02/14/2019 15:15   DG Chest Port 1 View  Result Date: 02/13/2019 CLINICAL DATA:  Shortness of breath for 3 days EXAM: PORTABLE CHEST 1 VIEW COMPARISON:  05/22/2018 FINDINGS: There is a small right pleural effusion.  There is a trace left pleural effusion. There bilateral emphysematous changes. There is right upper and right lower lung interstitial and alveolar airspace disease. There is no pneumothorax. The heart mediastinum are stable. There is no acute osseous abnormality. IMPRESSION: 1. Small right pleural effusion. Right upper and right lower lung airspace disease which may reflect atypical pulmonary edema versus multilobar pneumonia. Electronically Signed  By: Kathreen Devoid   On: 02/13/2019 18:14        Scheduled Meds: . amiodarone  200 mg Oral Daily  . budesonide (PULMICORT) nebulizer solution  0.25 mg Nebulization BID  . diltiazem  120 mg Oral Daily  . enoxaparin (LOVENOX) injection  30 mg Subcutaneous Q24H  . furosemide  40 mg Intravenous Q12H  . influenza vaccine adjuvanted  0.5 mL Intramuscular Tomorrow-1000  . ipratropium-albuterol  3 mL Nebulization BID  . methylPREDNISolone (SOLU-MEDROL) injection  40 mg Intravenous Q8H   Continuous Infusions: . azithromycin 500 mg (02/14/19 1510)  . cefTRIAXone (ROCEPHIN)  IV 1 g (02/14/19 1316)          Aline August, MD [facility] 02/15/2019, 8:00 AM

## 2019-02-15 NOTE — Plan of Care (Signed)
  Problem: Clinical Measurements: Goal: Will remain free from infection Outcome: Progressing   Problem: Clinical Measurements: Goal: Respiratory complications will improve Outcome: Progressing   Problem: Education: Goal: Knowledge of General Education information will improve Description: Including pain rating scale, medication(s)/side effects and non-pharmacologic comfort measures Outcome: Progressing

## 2019-02-15 NOTE — NC FL2 (Signed)
[place] MEDICAID FL2 LEVEL OF CARE SCREENING TOOL     IDENTIFICATION  Patient Name: Katherine [name] Birthdate: 02-05-1918 Sex: female Admission Date (Current Location): 02/13/2019  [facility] and [place] Number:  Herbalist and Address:  The . [facility], [place] [address], [place], [place] [address]      Provider Number: [id number]  Attending Physician Name and Address:  Aline August, MD  Relative Name and Phone Number:       Current Level of Care: Hospital Recommended Level of Care: [place] Prior Approval Number:    Date Approved/Denied:   PASRR Number:    Discharge Plan: SNF    Current Diagnoses: Patient Active Problem List   Diagnosis Date Noted  . Acute on chronic respiratory failure ([place]) 02/13/2019  . Nodule of upper lobe of right lung 02/01/2019  . Generalized osteoarthritis of multiple sites 02/01/2019  . Chest pain of uncertain etiology 52/84/1324  . Epistaxis 12/26/2018  . Allergic rhinitis 12/26/2018  . Right groin pain 12/08/2018  . TIA (transient ischemic attack) 09/12/2018  . Chronic pain of right knee 09/12/2018  . Slow transit constipation 08/29/2018  . GERD (gastroesophageal reflux disease) 08/29/2018  . Skin tag of anus 08/29/2018  . Light headedness 08/10/2018  . Acute respiratory failure with hypoxia and hypercapnia ([place]) 05/20/2018  . Pleural effusion due to CHF (congestive heart failure) ([place]) 05/19/2018  . Diastolic CHF ([place]) 40/11/2723  . Fatigue 04/25/2018  . Atrial fibrillation ([place]) 03/17/2018  . Chronic anemia 03/17/2018  . Kidney disease, chronic, stage IV (severe, EGFR 15-29 ml/min) ([place]) 03/17/2018  . Breast cancer ([place]) 03/16/2013  . Paget's disease and intraductal carcinoma of left breast ([place]) 03/16/2013  . Occlusion and stenosis of carotid artery without mention of cerebral infarction 01/06/2012  . Atypical mycobacterial disease 08/20/2011  . Hypothyroidism 07/24/2011  .  Hyperlipidemia 07/24/2011  . Hypertension 07/24/2011  . Community acquired pneumonia of right lung 07/23/2011  . COPD (chronic obstructive pulmonary disease) ([place]) 07/23/2011    Orientation RESPIRATION BLADDER Height & Weight     Self, Time, Situation, Place  O2([place] 2L) Continent Weight: 91 lb 14.9 oz (41.7 kg) Height:  4' 7"  (139.7 cm)  BEHAVIORAL SYMPTOMS/MOOD NEUROLOGICAL BOWEL NUTRITION STATUS      Continent Diet(regular)  AMBULATORY STATUS COMMUNICATION OF NEEDS Skin   Limited Assist Verbally Normal                       Personal Care Assistance Level of Assistance  Bathing, Feeding, Dressing Bathing Assistance: Limited assistance Feeding assistance: Limited assistance Dressing Assistance: Limited assistance     Functional Limitations Info  Sight, Hearing Sight Info: Impaired Hearing Info: Impaired      SPECIAL CARE FACTORS FREQUENCY                       Contractures Contractures Info: Not present    Additional Factors Info  Code Status, Allergies Code Status Info: DNR Allergies Info: Sulfa Antibiotics, Sulfamethoxazole, Sulfur, Amoxicillin, Penicillin G, Penicillins, Tape           Current Medications (02/15/2019):  This is the current hospital active medication list Current Facility-Administered Medications  Medication Dose Route Frequency Provider Last Rate Last Admin  . amiodarone (PACERONE) tablet 200 mg  200 mg Oral Daily Dana Allan I, MD   200 mg at 02/15/19 0837  . azithromycin (ZITHROMAX) 500 mg in sodium chloride 0.9 % 250 mL IVPB  500 mg Intravenous Q24H Aline August, MD 250 mL/hr at 02/15/19 1251 500 mg at 02/15/19 1251  . budesonide (PULMICORT) nebulizer solution 0.25 mg  0.25 mg Nebulization BID Dana Allan I, MD   0.25 mg at 02/15/19 0915  . cefTRIAXone (ROCEPHIN) 1 g in sodium chloride 0.9 % 100 mL IVPB  1 g Intravenous Q24H Alekh, Kshitiz, MD 200 mL/hr at 02/15/19 1139 1 g at 02/15/19 1139  . diltiazem (CARDIZEM CD)  24 hr capsule 120 mg  120 mg Oral Daily Dana Allan I, MD   120 mg at 02/15/19 0837  . enoxaparin (LOVENOX) injection 30 mg  30 mg Subcutaneous Q24H Starla Link, Kshitiz, MD   30 mg at 02/14/19 2357  . influenza vaccine adjuvanted (FLUAD) injection 0.5 mL  0.5 mL Intramuscular Tomorrow-1000 Dana Allan I, MD      . ipratropium-albuterol (DUONEB) 0.5-2.5 (3) MG/3ML nebulizer solution 3 mL  3 mL Nebulization Q6H PRN Dana Allan I, MD      . ipratropium-albuterol (DUONEB) 0.5-2.5 (3) MG/3ML nebulizer solution 3 mL  3 mL Nebulization BID Aline August, MD   3 mL at 02/15/19 0915  . methylPREDNISolone sodium succinate (SOLU-MEDROL) 40 mg/mL injection 40 mg  40 mg Intravenous Q8H Alekh, Kshitiz, MD   40 mg at 02/15/19 1250     Discharge Medications: Please see discharge summary for a list of discharge medications.  Relevant Imaging Results:  Relevant Lab Results:   Additional Information SS#: [id number]; Hospice to be set up at facility  [facility], LCSW

## 2019-02-15 NOTE — Progress Notes (Signed)
Manufacturing engineer - Hospice  Received request from [place] for patient interest in hospice services at [facility]. Chart reviewed and eligibility confirmed by physician.   Spoke with daughter by phone to explain services and answer questions. No DME needs at this time confirmed by [facility].   Please send home with patient signed and completed DNR.   Please send home with patient scripts for new medications.   [facility] Referral Specialist will contact family for first visit at facility.   Please do not hesitate to call with hospice questions.   Hospital Liaisons are listed daily on AMION under Hospice and [place]  Thank you,  Erling Conte, [place]

## 2019-02-15 NOTE — Consult Note (Signed)
Consultation Note Date: 02/15/2019   Patient Name: Katherine [name]  DOB: [birth year]  MRN: [id number]  Age / Sex: 85 y.o., female   PCP: [name], MD Referring Physician: Aline August, MD   REASON FOR CONSULTATION:Establishing goals of care  Palliative Care consult requested for goals of care in this 85 y.o. female with multiple medical problems including COPD (2L), lung nodule, CVA, uterine cancer, hypothyroidism, hypertension, grade 2 diastolic dysfunction, frequent UTIs and prior history of thoracentesis in April 2020 for right-sided pleural effusion. [name] presented to ED with complaints of shortness of breath which worsened on exertion. On work-up COVID-19 negative. Chest x-ray revealed right upper lobe and right lower lobe airspace disease and small pleural effusion. She was started on IV antibiotics. Since admission VQ scan negative for PE. CT of chest showed enlarging 3.5 cm tight upper lobe lung mass concerning for carcinoma.   Clinical Assessment and Goals of Care: I have reviewed medical records including lab results, imaging, Epic notes, and MAR, received report from the bedside RN, and assessed the patient. I met at the bedside with patient and spoke with patient's niece via speakerphone at the bedside  to discuss diagnosis prognosis, GOC, EOL wishes, disposition and options. Katherine [name] is awake, alert, and oriented x3. She denies pain or shortness of breath.   I introduced Palliative Medicine as specialized medical care for people living with serious illness. It focuses on providing relief from the symptoms and stress of a serious illness. The goal is to improve quality of life for both the patient and the family.  We discussed a brief life review of the patient, along with her functional and nutritional status. [name] reports she is a 56 year widow. She and her husband did not have any children. She is a retired Art therapist. She has 3 nieces who she is very  close to and looks to as her own children (they live in [place], [place], and [place]). Her niece Little Ishikawa is her closes relative and her HCPOA. She is also a CSW.   Prior to admission patient resided at [facility] where she has been a resident for over 10 years. She reports she recently moved from their independent living to their health care facility due to health decline. She reports being more wheelchair bound over the past 6 months. Her appetite is ok (some days better than others). She requires some assistance with her ADLs.   We discussed Her current illness and what it means in the larger context of Her on-going co-morbidities. With specific discussions regarding her pneumonia, COPD, lung nodule, and her overall functional state. Natural disease trajectory and expectations at EOL were discussed.  Both Katherine [name] and her niece verbalized understanding of her current illness. Katherine [name] reports the nodule has been there for some time however, it did not begin to cause complications in her breathing until most recently. She shares she is not interested in any further work-up acknowledging that she is surprised that she survived to see 2021.   She spends time sharing life memories and comparison of how her quality of life has diminished over time. Therapeutic listening and support provided.   I attempted to elicit values and goals of care important to the patient.    Katherine [name] reports at her age she is grateful to see 85 years old but she is ready to pass away when the time comes. She again confirms her wishes for no aggressive  interventions and wishing to return back to [facility] once stable. She reports she is not interested in coming back to the hospital but would like to focus on her comfort in familiar settings. She is hopeful her family may be present during her last moment.   The difference between aggressive medical intervention and comfort care was considered in light of the  patient's goals of care. I educated patient/family on what comfort care measures would look like. Patient and niece verbalized understanding and appreciation of discussion.   [name] confirms she would like to continue with current care while hospitalized with a focus on comfort at [facility].   Patient does have a documented advanced directive. [name] her niece, is listed as her 68. Katherine [name] reports she is not interested in heroic measures or artificial feedings. She confirms her wishes for DNR/DNI.   We discussed completion of MOST and the contents of the document. Katherine [name] verbalized wishes to complete and niece also in agreement. MOST completed as requested with patient's selections for DNR, Comfort Care, No abx or IVF, No feeding tube. Patient signed form and original and copy was placed in chart.   Hospice services outpatient were explained and offered given patient's expressed wishes for comfort, no aggressive interventions, or rehospitalizations. Katherine [name] and [name] verbalized their understanding and awareness of  hospice's goals and philosophy of care. They both verbalized wishes for hospice support at [facility]. I educated patient and niece on referral process.   Questions and concerns were addressed. The family was encouraged to call with questions or concerns.  PMT will continue to support holistically.   SOCIAL HISTORY:     reports that she quit smoking about 38 years ago. Her smoking use included cigarettes. She has a 40.00 pack-year smoking history. She has never used smokeless tobacco. She reports current alcohol use. She reports that she does not use drugs.  CODE STATUS: DNR  ADVANCE DIRECTIVES: Timoteo Gaul (niece/POA)  SYMPTOM MANAGEMENT: per attending   Palliative Prophylaxis:   Aspiration, Bowel Regimen, Delirium Protocol, Frequent Pain Assessment and Oral Care  PSYCHO-SOCIAL/SPIRITUAL:  Support System: Family  Desire for further Chaplaincy support:NO    Additional Recommendations (Limitations, Scope, Preferences):  No Artificial Feeding and Continue to treat the treatable, DNR/DNI, goal of comfort at discharge, no aggressive interventions or escalation of care.    PAST MEDICAL HISTORY: Past Medical History:  Diagnosis Date  . Arthritis   . Constipation   . COPD (chronic obstructive pulmonary disease) ([place])   . Fatigue   . Frequent UTI   . GERD (gastroesophageal reflux disease)   . History of shingles 2007  . Hyperlipemia   . Hypertension   . Hypothyroidism   . Multiple allergies   . Osteoporosis   . Paget's carcinoma of the nipple ([place]) 03/16/2013  . Pneumonia   . Seasonal allergies   . Stroke [facility]) 2010   no residual  . Thyroid disease   . Uterine cancer ([place])     PAST SURGICAL HISTORY:  Past Surgical History:  Procedure Laterality Date  . ABDOMINAL HYSTERECTOMY    . BREAST LUMPECTOMY     left breast  . CAROTID ENDARTERECTOMY Left June 10, 2008   CE  . EYE SURGERY     cataracts removed bilaterally  . TONSILLECTOMY      ALLERGIES:  is allergic to sulfa antibiotics; sulfamethoxazole; sulfur; amoxicillin; penicillin g; penicillins; and tape.   MEDICATIONS:  Current Facility-Administered Medications  Medication Dose Route Frequency Provider  Last Rate Last Admin  . amiodarone (PACERONE) tablet 200 mg  200 mg Oral Daily Dana Allan I, MD   200 mg at 02/15/19 0837  . azithromycin (ZITHROMAX) 500 mg in sodium chloride 0.9 % 250 mL IVPB  500 mg Intravenous Q24H Alekh, Kshitiz, MD 250 mL/hr at 02/14/19 1510 500 mg at 02/14/19 1510  . budesonide (PULMICORT) nebulizer solution 0.25 mg  0.25 mg Nebulization BID Dana Allan I, MD   0.25 mg at 02/15/19 0915  . cefTRIAXone (ROCEPHIN) 1 g in sodium chloride 0.9 % 100 mL IVPB  1 g Intravenous Q24H Alekh, Kshitiz, MD 200 mL/hr at 02/14/19 1316 1 g at 02/14/19 1316  . diltiazem (CARDIZEM CD) 24 hr capsule 120 mg  120 mg Oral Daily Dana Allan I, MD   120 mg at  02/15/19 0837  . enoxaparin (LOVENOX) injection 30 mg  30 mg Subcutaneous Q24H Starla Link, Kshitiz, MD   30 mg at 02/14/19 2357  . influenza vaccine adjuvanted (FLUAD) injection 0.5 mL  0.5 mL Intramuscular Tomorrow-1000 Dana Allan I, MD      . ipratropium-albuterol (DUONEB) 0.5-2.5 (3) MG/3ML nebulizer solution 3 mL  3 mL Nebulization Q6H PRN Dana Allan I, MD      . ipratropium-albuterol (DUONEB) 0.5-2.5 (3) MG/3ML nebulizer solution 3 mL  3 mL Nebulization BID Aline August, MD   3 mL at 02/15/19 0915  . methylPREDNISolone sodium succinate (SOLU-MEDROL) 40 mg/mL injection 40 mg  40 mg Intravenous Q8H Alekh, Kshitiz, MD   40 mg at 02/15/19 0521    VITAL SIGNS: BP (!) 131/56 (BP Location: Right Arm)   Pulse 78   Temp 98.2 F (36.8 C) (Oral)   Resp 16   Ht 4' 7"  (1.397 m)   Wt 41.7 kg   SpO2 96%   BMI 21.37 kg/m  Filed Weights   02/14/19 0406  Weight: 41.7 kg    Estimated body mass index is 21.37 kg/m as calculated from the following:   Height as of this encounter: 4' 7"  (1.397 m).   Weight as of this encounter: 41.7 kg.  LABS: CBC:    Component Value Date/Time   WBC 2.9 (L) 02/15/2019 0353   HGB 9.6 (L) 02/15/2019 0353   HGB 11.0 (L) 03/21/2014 1249   HCT 29.5 (L) 02/15/2019 0353   HCT 33.0 (L) 03/21/2014 1249   PLT 311 02/15/2019 0353   PLT 483 (H) 03/21/2014 1249   Comprehensive Metabolic Panel:    Component Value Date/Time   NA 138 02/15/2019 0353   NA 137 10/05/2018 0000   NA 138 03/21/2014 1249   K 4.3 02/15/2019 0353   K 4.3 03/21/2014 1249   CO2 29 02/15/2019 0353   CO2 35 10/05/2018 0000   CO2 28 03/21/2014 1249   BUN 52 (H) 02/15/2019 0353   BUN 51 (A) 10/05/2018 0000   BUN 14.9 03/21/2014 1249   CREATININE 2.09 (H) 02/15/2019 0353   CREATININE 1.1 03/21/2014 1249   ALBUMIN 3.2 (L) 02/13/2019 1659   ALBUMIN 3.1 (L) 03/21/2014 1249     Review of Systems  Fatigue Shortness of breath Weakness Unless otherwise noted, a complete review of  systems is negative.  Physical Exam General: NAD, frail appearing, thin Cardiovascular:rate controlled Pulmonary: diminished  Abdomen: soft, nontender, + bowel sounds Extremities: bilateral lower trace edema, no joint deformities Skin: no rashes Neurological: A&O x3, mood appropriate   Prognosis: < 6 months  Discharge Planning:  [place] with Hospice  Recommendations:  DNR/DNI-as confirmed  by patient/niece  MOST form completed as requested: DNR, Comfort Care, No IVF, No Abx, No PEG signed original and copy placed in patient's chart  Continue with current plan of care per medical team, no escalation of care  Patient/Niece expressed wishes is to continue to treat with goal of comfort/hospice care once discharged back to [facility]. Patient does not wish to proceed with aggressive interventions (further work-up for lung nodule).   Hospice referral placed  PMT will continue to support and follow as needed. Please call.    Palliative Performance Scale: PPS 20-30%                Patient and niece expressed understanding and was in agreement with this plan.   Thank you for allowing the Palliative Medicine Team to assist in the care of this patient.  Time In:  0945 Time Out: 1055 Time Total: 70 min.   Visit consisted of counseling and education dealing with the complex and emotionally intense issues of symptom management and palliative care in the setting of serious and potentially life-threatening illness.Greater than 50%  of this time was spent counseling and coordinating care related to the above assessment and plan.  Signed by:  Alda Lea, AGPCNP-BC Palliative Medicine Team  Phone: [phone] Fax: [phone] Pager: [phone] Amion: Bjorn Pippin

## 2019-02-16 LAB — CBC
HCT: 26.6 % — ABNORMAL LOW (ref 36.0–46.0)
Hemoglobin: 8.9 g/dL — ABNORMAL LOW (ref 12.0–15.0)
MCH: 32.4 pg (ref 26.0–34.0)
MCHC: 33.5 g/dL (ref 30.0–36.0)
MCV: 96.7 fL (ref 80.0–100.0)
Platelets: 306 10*3/uL (ref 150–400)
RBC: 2.75 MIL/uL — ABNORMAL LOW (ref 3.87–5.11)
RDW: 13.5 % (ref 11.5–15.5)
WBC: 10 10*3/uL (ref 4.0–10.5)
nRBC: 0 % (ref 0.0–0.2)

## 2019-02-16 LAB — BASIC METABOLIC PANEL
Anion gap: 11 (ref 5–15)
BUN: 51 mg/dL — ABNORMAL HIGH (ref 8–23)
CO2: 28 mmol/L (ref 22–32)
Calcium: 9 mg/dL (ref 8.9–10.3)
Chloride: 97 mmol/L — ABNORMAL LOW (ref 98–111)
Creatinine, Ser: 1.84 mg/dL — ABNORMAL HIGH (ref 0.44–1.00)
GFR calc Af Amer: 26 mL/min — ABNORMAL LOW (ref >60)
GFR calc non Af Amer: 22 mL/min — ABNORMAL LOW (ref >60)
Glucose, Bld: 146 mg/dL — ABNORMAL HIGH (ref 70–99)
Potassium: 3.9 mmol/L (ref 3.5–5.1)
Sodium: 136 mmol/L (ref 135–145)

## 2019-02-16 LAB — MAGNESIUM: Magnesium: 2.2 mg/dL (ref 1.7–2.4)

## 2019-02-16 MED ORDER — CEFUROXIME AXETIL 500 MG PO TABS: 500.0000 mg | ORAL_TABLET | Freq: Two times a day (BID) | ORAL | 0 refills | Status: AC

## 2019-02-16 MED ORDER — PREDNISONE 20 MG PO TABS: 40.0000 mg | ORAL_TABLET | Freq: Every day | ORAL | 0 refills | Status: AC

## 2019-02-16 MED ORDER — MORPHINE SULFATE 10 MG/5ML PO SOLN: 5.0000 mg | ORAL | 0 refills | Status: AC | PRN

## 2019-02-16 NOTE — TOC Transition Note (Signed)
Transition of Care [facility]) - CM/SW Discharge Note   Patient Details  Name: Katherine [name] MRN: [id number] Date of Birth: [birth year]  Transition of Care [facility]) CM/SW Contact:  Geralynn Ochs, LCSW Phone Number: 02/16/2019, 9:52 AM   Clinical Narrative:   Nurse to call report to [phone].     Final next level of care: Skilled Nursing Facility Barriers to Discharge: Barriers Resolved   Patient Goals and CMS Choice Patient states their goals for this hospitalization and ongoing recovery are:: Comfort CMS Medicare.gov Compare Post Acute Care list provided to:: Patient Represenative (must comment)(Lisa, niece) Choice offered to / list presented to : (Niece, [name])  Discharge Placement              Patient chooses bed at: [facility] Patient to be transferred to facility by: [facility] Name of family member notified: [name] Patient and family notified of of transfer: 02/16/19  Discharge Plan and Services In-house Referral: Clinical Social Work   Post Acute Care Choice: Hospice                               Social Determinants of Health (SDOH) Interventions     Readmission Risk Interventions Readmission Risk Prevention Plan 05/22/2018 05/02/2018 04/28/2018  Transportation Screening Complete - Complete  PCP or Specialist Appt within 3-5 Days - - Not Complete  Not Complete comments - - Will check closer to discharge.  [place] or Home Care Consult - - Not Complete  Social Work Consult for [place] Planning/Counseling - - Complete  Palliative Care Screening - - Not Applicable  Medication Review (RN Care Manager) - - Referral to Pharmacy  PCP or Specialist appointment within 3-5 days of discharge Not Complete - -  [place]RI or [place] Not Complete Not Complete -  HRI or Home Care Consult Pt Refusal Comments (No Data) Going to SNF -  SW Recovery Care/Counseling Consult Complete Complete -  Palliative Care Screening Not Applicable Not Applicable -  Skilled Nursing  Facility Complete Complete -  Some recent data might be hidden

## 2019-02-16 NOTE — Discharge Summary (Signed)
Physician Discharge Summary  Katherine [name] QBV:[id number] DOB: 1917/03/25 DOA: 02/13/2019  PCP: [name], MD  Admit date: 02/13/2019 Discharge date: 02/16/2019  Admitted From: SNF Disposition: SNF with hospice  Recommendations for Outpatient Follow-up:  1. Follow up with hospice at [facility]: No Equipment/Devices: Oxygen via nasal cannula 2 to 4 L/min.  Patient was on the same prior to presentation  Discharge Condition: Poor CODE STATUS: DNR Diet recommendation: Heart healthy  Brief/Interim Summary: 86 year old female with history of COPD on 2 to 4 L oxygen at baseline, known lung nodule, CVA unspecified, uterine cancer, hypothyroidism, hypertension, grade 2 diastolic dysfunction, frequent UTIs, prior history of thoracentesis in April 2020 for right-sided pleural effusion presented with worsening shortness of breath.  COVID-19 testing was negative.  Chest x-ray revealed right upper lobe and right lower lobe airspace disease and small pleural effusion along with mild leukocytosis.  She was started on IV antibiotics.  She was also started on intravenous Solu-Medrol.  VQ scan was negative for PE.  CT of the chest showed enlarging lung mass up to 3.5 cm in the right upper lobe with cavitation concerning for carcinoma.  After palliative care evaluation, patient/family has agreed for return to SNF with hospice.  She will be discharged today to SNF with hospice on oral antibiotics and steroids.   Discharge Diagnoses:   Probable community-acquired pneumonia Chronic hypoxic respiratory failure COPD with possible mild exacerbation Pleural effusion -Patient is on 2 to 4 L oxygen via nasal cannula at home.  Currently requiring 2 L oxygen intermittently. -Currently on Rocephin and Zithromax  and IV Solu-Medrol.  Covid testing has been negative. -CT of the chest showed left upper lobe density with concern for infection along with pleural effusion on the right side.   -VQ scan was  negative for PE -We will discharge on oral Ceftin for 5 more days and prednisone 40 mg daily for 7 days.  Enlarging right upper lobe spiculated lung mass -CT chest showed enlarging lung mass up to 3.5 cm in the right upper lobe with cavitation concerning for carcinoma -Palliative care evaluation appreciated.  -After palliative care evaluation, patient/family has agreed for return to SNF with hospice.  She will be discharged today to SNF with hospice on oral antibiotics and steroids. -Recommend no further testing as patient has opted for hospice  Acute on chronic diastolic CHF -Treated with intravenous Lasix.  Resume torsemide on discharge.  Acute kidney injury on chronic kidney disease stage 4 -Baseline creatinine around 1.6-1.9.  Creatinine 1.84 today.  Renal ultrasound was negative for hydronephrosis.  Generalized deconditioning -Return to SNF with hospice as above.   Discharge Instructions  Discharge Instructions    Diet - low sodium heart healthy   Complete by: As directed    Increase activity slowly   Complete by: As directed      Allergies as of 02/16/2019      Reactions   Sulfa Antibiotics Shortness Of Breath, Palpitations   Sulfamethoxazole Shortness Of Breath, Palpitations   Sulfur Shortness Of Breath, Palpitations   "Allergic," per MAR (although I believe they might've meant "Sulfa"??)   Amoxicillin Rash   Penicillin G Rash   Did it involve swelling of the face/tongue/throat, SOB, or low BP? Yes Did it involve sudden or severe rash/hives, skin peeling, or any reaction on the inside of your mouth or nose? Unk Did you need to seek medical attention at a hospital or doctor's office? Unk When did it last happen? "  it was a long time ago" If all above answers are "NO", may proceed with cephalosporin use.   Penicillins Rash   Did it involve swelling of the face/tongue/throat, SOB, or low BP? Yes Did it involve sudden or severe rash/hives, skin peeling, or any  reaction on the inside of your mouth or nose? Unk Did you need to seek medical attention at a hospital or doctor's office? Unk When did it last happen? "it was a long time ago" If all above answers are "NO", may proceed with cephalosporin use.   Tape Other (See Comments)   SKIN IS VERY FRAGILE- PLEASE USE AN ALTERNATIVE TO TAPE, AS THE SKIN TEARS EASILY!!      Medication List    TAKE these medications   acetaminophen 500 MG tablet Commonly known as: TYLENOL Take 500 mg by mouth 2 (two) times daily.   amiodarone 200 MG tablet Commonly known as: PACERONE Take 200 mg by mouth daily.   apixaban 2.5 MG Tabs tablet Commonly known as: ELIQUIS Take 1 tablet (2.5 mg total) by mouth 2 (two) times daily.   cefUROXime 500 MG tablet Commonly known as: CEFTIN Take 1 tablet (500 mg total) by mouth 2 (two) times daily for 5 days.   Demadex 20 MG tablet Generic drug: torsemide Take 20 mg by mouth daily.   diltiazem 120 MG 24 hr capsule Commonly known as: CARDIZEM CD Take 1 capsule (120 mg total) by mouth daily.   ergocalciferol 1.25 MG (50000 UT) capsule Commonly known as: VITAMIN D2 Take 1,250 Units by mouth once a week. Once a day on Sunday   fluticasone 50 MCG/ACT nasal spray Commonly known as: FLONASE Place 2 sprays into both nostrils every morning.   Fluticasone-Salmeterol 250-50 MCG/DOSE Aepb Commonly known as: ADVAIR Inhale 1 puff into the lungs 2 (two) times a day.   ipratropium-albuterol 0.5-2.5 (3) MG/3ML Soln Commonly known as: DUONEB Take 3 mLs by nebulization every 6 (six) hours as needed (WHEEZING).   levothyroxine 100 MCG tablet Commonly known as: SYNTHROID Take 100 mcg by mouth daily before breakfast.   linaclotide 145 MCG Caps capsule Commonly known as: LINZESS Take 145 mcg by mouth every other day.   morphine 10 MG/5ML solution Take 2.5 mLs (5 mg total) by mouth every 2 (two) hours as needed for severe pain.   OXYGEN Inhale 2-4 L into the lungs  continuous. SAT MUST BE >90%   pantoprazole 40 MG tablet Commonly known as: PROTONIX Take 40 mg by mouth daily.   potassium chloride 20 MEQ packet Commonly known as: KLOR-CON Take 20 mEq by mouth daily.   predniSONE 20 MG tablet Commonly known as: Deltasone Take 2 tablets (40 mg total) by mouth daily for 7 days.   sennosides-docusate sodium 8.6-50 MG tablet Commonly known as: SENOKOT-S Take 2 tablets by mouth daily.   sodium chloride 0.65 % Soln nasal spray Commonly known as: OCEAN Place 1 spray into both nostrils. Apply to RT and LT Nostril Twice A Day   tiotropium 18 MCG inhalation capsule Commonly known as: SPIRIVA Place 18 mcg into inhaler and inhale daily.   zinc oxide 20 % ointment Apply 1 application topically as needed for irritation. Apply to buttocks after every incontinent episode and as needed for redness      Contact information for after-discharge care    Destination    [facility] .   Service: Skilled Nursing Contact information: [address] [place] [phone]  Allergies  Allergen Reactions  . Sulfa Antibiotics Shortness Of Breath and Palpitations  . Sulfamethoxazole Shortness Of Breath and Palpitations  . Sulfur Shortness Of Breath and Palpitations    "Allergic," per MAR (although I believe they might've meant "Sulfa"??)  . Amoxicillin Rash  . Penicillin G Rash    Did it involve swelling of the face/tongue/throat, SOB, or low BP? Yes Did it involve sudden or severe rash/hives, skin peeling, or any reaction on the inside of your mouth or nose? Unk Did you need to seek medical attention at a hospital or doctor's office? Unk When did it last happen? "it was a long time ago" If all above answers are "NO", may proceed with cephalosporin use.   Marland Kitchen Penicillins Rash    Did it involve swelling of the face/tongue/throat, SOB, or low BP? Yes Did it involve sudden or severe  rash/hives, skin peeling, or any reaction on the inside of your mouth or nose? Unk Did you need to seek medical attention at a hospital or doctor's office? Unk When did it last happen? "it was a long time ago" If all above answers are "NO", may proceed with cephalosporin use.   . Tape Other (See Comments)    SKIN IS VERY FRAGILE- PLEASE USE AN ALTERNATIVE TO TAPE, AS THE SKIN TEARS EASILY!!    Consultations:  Palliative care   Procedures/Studies: CT Chest Wo Contrast  Result Date: 02/14/2019 CLINICAL DATA:  Shortness of breath EXAM: CT CHEST WITHOUT CONTRAST TECHNIQUE: Multidetector CT imaging of the chest was performed following the standard protocol without IV contrast. COMPARISON:  Chest x-ray 02/13/2019, CT chest 04/29/2018 FINDINGS: Cardiovascular: Limited evaluation without intravenous contrast. Nonaneurysmal aorta. Moderate to marked aortic atherosclerosis. Coronary vascular calcification. Borderline cardiomegaly. No significant pericardial effusion. Mediastinum/Nodes: Midline trachea. Stable 8 mm calcified nodule right lobe of thyroid, no further follow-up recommended. Redemonstrated mediastinal adenopathy. Low right paratracheal region lymph node measures 2.1 cm compared with 2.5 cm previously. Subcarinal node measures 1 cm compared with 16 mm previously. Small right cardio phrenic nodes are unchanged. Lungs/Pleura: Small-moderate right pleural effusion. Scarring at the right lung apex. Interval enlargement of spiculated right upper lobe lung mass, now with small amount of cavitation. Mass measures approximately 3.5 by 2.7 by 2.6 cm, compared with 1.3 x 1.4 cm previously. Mass appears contiguous with linear airspace disease extending to the right hilar region. Bronchiectasis within the right upper lobe, right middle lobe and bilateral lower lobes with bronchial wall thickening. Small scattered foci of slightly nodular appearing airspace disease in the left upper lobe. Mild diffuse septal  thickening on the right could indicate mild asymmetric edema. Upper Abdomen: No acute abnormality.  Splenic granuloma. Musculoskeletal: Degenerative changes of the spine. No acute osseous abnormality. IMPRESSION: 1. Interval enlargement of spiculated right upper lobe lung mass, now measuring up to 3.5 cm, previously 1.4 cm. The mass now also demonstrates mild cavitation and remains concerning for carcinoma. Increasing right parahilar soft tissue density, which may be contiguous with the spiculated mass. Mediastinal adenopathy persists but does not appear progressed. 2. Small moderate right pleural effusion. Interim finding of small scattered ill-defined nodular foci of density, most notable in the left upper lobe, possibly reflecting acute respiratory infection. 3. Emphysema Aortic Atherosclerosis (ICD10-I70.0) and Emphysema (ICD10-J43.9). Electronically Signed   By: Donavan Foil M.D.   On: 02/14/2019 01:07   US Renal  Result Date: 02/13/2019 CLINICAL DATA:  Acute kidney injury. EXAM: RENAL / URINARY TRACT ULTRASOUND COMPLETE COMPARISON:  None. FINDINGS: Right Kidney:  Renal measurements: 8.7 x 3.0 x 2.0 cm = volume: 29 mL. Increased echogenicity. No mass or hydronephrosis visualized. Left Kidney: Renal measurements: 8.1 x 3.1 x 2.4 cm = volume: 32 mL. Increased echogenicity. No mass or hydronephrosis visualized. Bladder: Multiple bladder diverticula noted.  No wall thickening. Other: None. IMPRESSION: 1. Bilateral renal atrophy and increased cortical echogenicity, consistent with underlying medical renal disease. 2. Multiple bladder diverticula. Electronically Signed   By: Titus Dubin M.D.   On: 02/13/2019 21:15   NM Pulmonary Perf and Vent  Result Date: 02/14/2019 CLINICAL DATA:  Evaluate for pulmonary embolus.  History of COPD. EXAM: NUCLEAR MEDICINE VENTILATION - PERFUSION LUNG SCAN TECHNIQUE: Ventilation images were obtained in multiple projections using inhaled aerosol Tc-64m DTPA. Perfusion  images were obtained in multiple projections after intravenous injection of Tc-17m MAA. RADIOPHARMACEUTICALS:  39.8 mCi of Tc-53m DTPA aerosol inhalation and 1.5 mCi Tc22m MAA IV COMPARISON:  CT chest 02/14/2019 FINDINGS: Ventilation: Photopenic defect within the right upper lobe corresponds to right upper lobe lung mass identified on recent chest CT. Non uniform soft tissue attenuation artifact is identified on the lateral projection images as well as the anterior oblique images. Perfusion: No wedge shaped peripheral perfusion defects to suggest acute pulmonary embolism. Photopenic defect within the right upper lobe corresponds to the ventilation and CT abnormality. IMPRESSION: 1. No evidence for acute pulmonary embolus. Electronically Signed   By: Kerby Moors M.D.   On: 02/14/2019 15:15   DG Chest Port 1 View  Result Date: 02/13/2019 CLINICAL DATA:  Shortness of breath for 3 days EXAM: PORTABLE CHEST 1 VIEW COMPARISON:  05/22/2018 FINDINGS: There is a small right pleural effusion. There is a trace left pleural effusion. There bilateral emphysematous changes. There is right upper and right lower lung interstitial and alveolar airspace disease. There is no pneumothorax. The heart mediastinum are stable. There is no acute osseous abnormality. IMPRESSION: 1. Small right pleural effusion. Right upper and right lower lung airspace disease which may reflect atypical pulmonary edema versus multilobar pneumonia. Electronically Signed   By: Kathreen Devoid   On: 02/13/2019 18:14       Subjective: Patient seen and examined at bedside.  Poor historian.  Feels weak.  Denies overnight fever or vomiting.  Discharge Exam: Vitals:   02/16/19 0753 02/16/19 0802  BP:  (!) 117/47  Pulse:  80  Resp:  19  Temp:  98 F (36.7 C)  SpO2: 100% 95%    General: Pt is alert, awake, not in acute distress.  Elderly female lying in bed.  Hard of hearing.  Poor historian.   Cardiovascular: rate controlled, S1/S2  + Respiratory: bilateral decreased breath sounds at bases with scattered crackles Abdominal: Soft, NT, ND, bowel sounds + Extremities: Trace lower extremity edema, no cyanosis    The results of significant diagnostics from this hospitalization (including imaging, microbiology, ancillary and laboratory) are listed below for reference.     Microbiology: Recent Results (from the past 240 hour(s))  Culture, blood (routine x 2)     Status: None (Preliminary result)   Collection Time: 02/13/19  7:00 PM   Specimen: BLOOD RIGHT ARM  Result Value Ref Range Status   Specimen Description BLOOD RIGHT ARM  Final   Special Requests   Final    BOTTLES DRAWN AEROBIC AND ANAEROBIC Blood Culture adequate volume   Culture   Final    NO GROWTH 2 DAYS Performed at [facility], [address], [place], [place] [address]  Report Status PENDING  Incomplete  Culture, blood (routine x 2)     Status: None (Preliminary result)   Collection Time: 02/13/19  7:15 PM   Specimen: BLOOD  Result Value Ref Range Status   Specimen Description BLOOD LEFT ANTECUBITAL  Final   Special Requests   Final    BOTTLES DRAWN AEROBIC AND ANAEROBIC Blood Culture adequate volume   Culture   Final    NO GROWTH 2 DAYS Performed at [facility], [address], [place], [place] [address]    Report Status PENDING  Incomplete  SARS CORONAVIRUS 2 (TAT 6-24 HRS) Nasopharyngeal Nasopharyngeal Swab     Status: None   Collection Time: 02/13/19  7:15 PM   Specimen: Nasopharyngeal Swab  Result Value Ref Range Status   SARS Coronavirus 2 NEGATIVE NEGATIVE Final    Comment: (NOTE) SARS-CoV-2 target nucleic acids are NOT DETECTED. The SARS-CoV-2 RNA is generally detectable in upper and lower respiratory specimens during the acute phase of infection. Negative results do not preclude SARS-CoV-2 infection, do not rule out co-infections with other pathogens, and should not be used as the sole basis for treatment or other  patient management decisions. Negative results must be combined with clinical observations, patient history, and epidemiological information. The expected result is Negative. Fact Sheet for Patients: SugarRoll.be Fact Sheet for Healthcare Providers: https://www.woods-mathews.com/ This test is not yet approved or cleared by the [place] FDA and  has been authorized for detection and/or diagnosis of SARS-CoV-2 by FDA under an Emergency Use Authorization (EUA). This EUA will remain  in effect (meaning this test can be used) for the duration of the COVID-19 declaration under Section 56 4(b)(1) of the Act, 21 U.S.C. section 360bbb-3(b)(1), unless the authorization is terminated or revoked sooner. Performed at [facility], [place] [address], [place], [place] [address]   MRSA PCR Screening     Status: None   Collection Time: 02/13/19  8:26 PM   Specimen: Nasal Mucosa; Nasopharyngeal  Result Value Ref Range Status   MRSA by PCR NEGATIVE NEGATIVE Final    Comment:        The GeneXpert MRSA Assay (FDA approved for NASAL specimens only), is one component of a comprehensive MRSA colonization surveillance program. It is not intended to diagnose MRSA infection nor to guide or monitor treatment for MRSA infections. Performed at [facility], [place] [address] [place], [place] [address]   SARS CORONAVIRUS 2 (TAT 6-24 HRS) Nasopharyngeal Nasopharyngeal Swab     Status: None   Collection Time: 02/15/19  1:06 PM   Specimen: Nasopharyngeal Swab  Result Value Ref Range Status   SARS Coronavirus 2 NEGATIVE NEGATIVE Final    Comment: (NOTE) SARS-CoV-2 target nucleic acids are NOT DETECTED. The SARS-CoV-2 RNA is generally detectable in upper and lower respiratory specimens during the acute phase of infection. Negative results do not preclude SARS-CoV-2 infection, do not rule out co-infections with other pathogens, and should not be used as  the sole basis for treatment or other patient management decisions. Negative results must be combined with clinical observations, patient history, and epidemiological information. The expected result is Negative. Fact Sheet for Patients: SugarRoll.be Fact Sheet for Healthcare Providers: https://www.woods-mathews.com/ This test is not yet approved or cleared by the [place] FDA and  has been authorized for detection and/or diagnosis of SARS-CoV-2 by FDA under an Emergency Use Authorization (EUA). This EUA will remain  in effect (meaning this test can be used) for the duration  of the COVID-19 declaration under Section 56 4(b)(1) of the Act, 21 U.S.C. section 360bbb-3(b)(1), unless the authorization is terminated or revoked sooner. Performed at [facility], [place] [address], [place], [place] [address]      Labs: BNP (last 3 results) Recent Labs    04/30/18 1333 05/19/18 2000 02/13/19 1936  BNP 352.0* 315.7* 902.4*   Basic Metabolic Panel: Recent Labs  Lab 02/13/19 1659 02/13/19 2029 02/13/19 2037 02/14/19 0339 02/15/19 0353 02/16/19 0432  NA 134*  --  133* 135 138 136  K 5.3*  --  4.7 4.3 4.3 3.9  CL 94*  --   --  96* 97* 97*  CO2 27  --   --  26 29 28   GLUCOSE 126*  --   --  105* 155* 146*  BUN 67*  --   --  58* 52* 51*  CREATININE 2.20*  --   --  1.98* 2.09* 1.84*  CALCIUM 9.3  --   --  9.1 9.2 9.0  MG  --  2.2  --   --  2.2 2.2  PHOS  --  3.9  --   --   --   --    Liver Function Tests: Recent Labs  Lab 02/13/19 1659  AST 17  ALT 17  ALKPHOS 89  BILITOT 0.5  PROT 6.7  ALBUMIN 3.2*   No results for input(s): LIPASE, AMYLASE in the last 168 hours. No results for input(s): AMMONIA in the last 168 hours. CBC: Recent Labs  Lab 02/13/19 1659 02/13/19 2037 02/14/19 0339 02/15/19 0353 02/16/19 0432  WBC 11.3*  --  9.4 2.9* 10.0  HGB 10.4* 9.5* 10.2* 9.6* 8.9*  HCT 32.2* 28.0* 30.9* 29.5* 26.6*  MCV 99.7   --  97.5 98.0 96.7  PLT 327  --  314 311 306   Cardiac Enzymes: No results for input(s): CKTOTAL, CKMB, CKMBINDEX, TROPONINI in the last 168 hours. BNP: Invalid input(s): POCBNP CBG: No results for input(s): GLUCAP in the last 168 hours. D-Dimer Recent Labs    02/13/19 1659  DDIMER 1.08*   Hgb A1c No results for input(s): HGBA1C in the last 72 hours. Lipid Profile No results for input(s): CHOL, HDL, LDLCALC, TRIG, CHOLHDL, LDLDIRECT in the last 72 hours. Thyroid function studies Recent Labs    02/13/19 2029  TSH 0.452   Anemia work up No results for input(s): VITAMINB12, FOLATE, FERRITIN, TIBC, IRON, RETICCTPCT in the last 72 hours. Urinalysis    Component Value Date/Time   COLORURINE YELLOW 02/13/2019 2049   APPEARANCEUR CLEAR 02/13/2019 2049   LABSPEC 1.011 02/13/2019 2049   PHURINE 7.0 02/13/2019 2049   GLUCOSEU NEGATIVE 02/13/2019 2049   HGBUR NEGATIVE 02/13/2019 2049   [place] NEGATIVE 02/13/2019 2049   [place] 02/13/2019 2049   PROTEINUR NEGATIVE 02/13/2019 2049   UROBILINOGEN 1.0 03/16/2012 1126   NITRITE NEGATIVE 02/13/2019 2049   LEUKOCYTESUR NEGATIVE 02/13/2019 2049   Sepsis Labs Invalid input(s): PROCALCITONIN,  WBC,  LACTICIDVEN Microbiology Recent Results (from the past 240 hour(s))  Culture, blood (routine x 2)     Status: None (Preliminary result)   Collection Time: 02/13/19  7:00 PM   Specimen: BLOOD RIGHT ARM  Result Value Ref Range Status   Specimen Description BLOOD RIGHT ARM  Final   Special Requests   Final    BOTTLES DRAWN AEROBIC AND ANAEROBIC Blood Culture adequate volume   Culture   Final    NO GROWTH 2 DAYS Performed at [facility], [place] [address], [place], [place] [address]    Report Status PENDING  Incomplete  Culture, blood (routine x 2)     Status: None (Preliminary result)   Collection Time: 02/13/19  7:15 PM   Specimen: BLOOD  Result Value Ref Range Status   Specimen Description BLOOD LEFT  ANTECUBITAL  Final   Special Requests   Final    BOTTLES DRAWN AEROBIC AND ANAEROBIC Blood Culture adequate volume   Culture   Final    NO GROWTH 2 DAYS Performed at [facility], [place] [address], [place], [place] [address]    Report Status PENDING  Incomplete  SARS CORONAVIRUS 2 (TAT 6-24 HRS) Nasopharyngeal Nasopharyngeal Swab     Status: None   Collection Time: 02/13/19  7:15 PM   Specimen: Nasopharyngeal Swab  Result Value Ref Range Status   SARS Coronavirus 2 NEGATIVE NEGATIVE Final    Comment: (NOTE) SARS-CoV-2 target nucleic acids are NOT DETECTED. The SARS-CoV-2 RNA is generally detectable in upper and lower respiratory specimens during the acute phase of infection. Negative results do not preclude SARS-CoV-2 infection, do not rule out co-infections with other pathogens, and should not be used as the sole basis for treatment or other patient management decisions. Negative results must be combined with clinical observations, patient history, and epidemiological information. The expected result is Negative. Fact Sheet for Patients: SugarRoll.be Fact Sheet for Healthcare Providers: https://www.woods-mathews.com/ This test is not yet approved or cleared by the [place] FDA and  has been authorized for detection and/or diagnosis of SARS-CoV-2 by FDA under an Emergency Use Authorization (EUA). This EUA will remain  in effect (meaning this test can be used) for the duration of the COVID-19 declaration under Section 56 4(b)(1) of the Act, 21 U.S.C. section 360bbb-3(b)(1), unless the authorization is terminated or revoked sooner. Performed at [facility], [place] [address], [place], [place] [address]   MRSA PCR Screening     Status: None   Collection Time: 02/13/19  8:26 PM   Specimen: Nasal Mucosa; Nasopharyngeal  Result Value Ref Range Status   MRSA by PCR NEGATIVE NEGATIVE Final    Comment:        The GeneXpert MRSA  Assay (FDA approved for NASAL specimens only), is one component of a comprehensive MRSA colonization surveillance program. It is not intended to diagnose MRSA infection nor to guide or monitor treatment for MRSA infections. Performed at [facility], [place] [address] [place], [place] [address]   SARS CORONAVIRUS 2 (TAT 6-24 HRS) Nasopharyngeal Nasopharyngeal Swab     Status: None   Collection Time: 02/15/19  1:06 PM   Specimen: Nasopharyngeal Swab  Result Value Ref Range Status   SARS Coronavirus 2 NEGATIVE NEGATIVE Final    Comment: (NOTE) SARS-CoV-2 target nucleic acids are NOT DETECTED. The SARS-CoV-2 RNA is generally detectable in upper and lower respiratory specimens during the acute phase of infection. Negative results do not preclude SARS-CoV-2 infection, do not rule out co-infections with other pathogens, and should not be used as the sole basis for treatment or other patient management decisions. Negative results must be combined with clinical observations, patient history, and epidemiological information. The expected result is Negative. Fact Sheet for Patients: SugarRoll.be Fact Sheet for Healthcare Providers: [email or web address] This test is not yet approved or cleared by the [place] FDA and  has been authorized for detection and/or diagnosis of SARS-CoV-2 by FDA under an Emergency Use Authorization (EUA). This EUA will remain  in effect (meaning this test can be used) for the duration of the COVID-19 declaration under Section 56 4(b)(1) of the Act, 21 U.S.C. section 360bbb-3(b)(1), unless the authorization is terminated or revoked sooner. Performed at [facility], [place] [address], [place], [place] [address]      Time coordinating discharge: 35 minutes  SIGNED:   Aline August, MD  [facility] 02/16/2019, 9:05 AM

## 2019-02-17 DIAGNOSIS — J189 Pneumonia, unspecified organism: Secondary | ICD-10-CM | POA: Diagnosis not present

## 2019-02-17 DIAGNOSIS — E039 Hypothyroidism, unspecified: Secondary | ICD-10-CM | POA: Diagnosis not present

## 2019-02-17 DIAGNOSIS — I4891 Unspecified atrial fibrillation: Secondary | ICD-10-CM | POA: Diagnosis not present

## 2019-02-17 DIAGNOSIS — C3411 Malignant neoplasm of upper lobe, right bronchus or lung: Secondary | ICD-10-CM | POA: Diagnosis not present

## 2019-02-17 DIAGNOSIS — I5031 Acute diastolic (congestive) heart failure: Secondary | ICD-10-CM | POA: Diagnosis not present

## 2019-02-17 DIAGNOSIS — Z87891 Personal history of nicotine dependence: Secondary | ICD-10-CM | POA: Diagnosis not present

## 2019-02-17 DIAGNOSIS — Z87440 Personal history of urinary (tract) infections: Secondary | ICD-10-CM | POA: Diagnosis not present

## 2019-02-17 DIAGNOSIS — E785 Hyperlipidemia, unspecified: Secondary | ICD-10-CM | POA: Diagnosis not present

## 2019-02-17 DIAGNOSIS — K219 Gastro-esophageal reflux disease without esophagitis: Secondary | ICD-10-CM | POA: Diagnosis not present

## 2019-02-17 DIAGNOSIS — J449 Chronic obstructive pulmonary disease, unspecified: Secondary | ICD-10-CM | POA: Diagnosis not present

## 2019-02-17 DIAGNOSIS — J302 Other seasonal allergic rhinitis: Secondary | ICD-10-CM | POA: Diagnosis not present

## 2019-02-17 DIAGNOSIS — I110 Hypertensive heart disease with heart failure: Secondary | ICD-10-CM | POA: Diagnosis not present

## 2019-02-17 DIAGNOSIS — I69318 Other symptoms and signs involving cognitive functions following cerebral infarction: Secondary | ICD-10-CM | POA: Diagnosis not present

## 2019-02-18 LAB — CULTURE, BLOOD (ROUTINE X 2)
Culture: NO GROWTH
Culture: NO GROWTH
Special Requests: ADEQUATE
Special Requests: ADEQUATE

## 2019-02-19 ENCOUNTER — Non-Acute Institutional Stay (SKILLED_NURSING_FACILITY): Payer: Medicare Other | Admitting: Internal Medicine

## 2019-02-19 ENCOUNTER — Encounter: Payer: Self-pay | Admitting: Internal Medicine

## 2019-02-19 DIAGNOSIS — C3491 Malignant neoplasm of unspecified part of right bronchus or lung: Secondary | ICD-10-CM | POA: Diagnosis not present

## 2019-02-19 DIAGNOSIS — J9601 Acute respiratory failure with hypoxia: Secondary | ICD-10-CM | POA: Diagnosis not present

## 2019-02-19 DIAGNOSIS — I5032 Chronic diastolic (congestive) heart failure: Secondary | ICD-10-CM | POA: Diagnosis not present

## 2019-02-19 DIAGNOSIS — J449 Chronic obstructive pulmonary disease, unspecified: Secondary | ICD-10-CM | POA: Diagnosis not present

## 2019-02-19 DIAGNOSIS — I4891 Unspecified atrial fibrillation: Secondary | ICD-10-CM | POA: Diagnosis not present

## 2019-02-19 DIAGNOSIS — I4811 Longstanding persistent atrial fibrillation: Secondary | ICD-10-CM

## 2019-02-19 DIAGNOSIS — E785 Hyperlipidemia, unspecified: Secondary | ICD-10-CM | POA: Diagnosis not present

## 2019-02-19 DIAGNOSIS — R040 Epistaxis: Secondary | ICD-10-CM | POA: Diagnosis not present

## 2019-02-19 DIAGNOSIS — I110 Hypertensive heart disease with heart failure: Secondary | ICD-10-CM | POA: Diagnosis not present

## 2019-02-19 DIAGNOSIS — N1832 Chronic kidney disease, stage 3b: Secondary | ICD-10-CM

## 2019-02-19 DIAGNOSIS — I5031 Acute diastolic (congestive) heart failure: Secondary | ICD-10-CM | POA: Diagnosis not present

## 2019-02-19 DIAGNOSIS — J9602 Acute respiratory failure with hypercapnia: Secondary | ICD-10-CM

## 2019-02-19 DIAGNOSIS — C3411 Malignant neoplasm of upper lobe, right bronchus or lung: Secondary | ICD-10-CM | POA: Diagnosis not present

## 2019-02-19 NOTE — Progress Notes (Signed)
Provider:  [name], MD Location:   [facility] Room Number: 8 Place of Service:  SNF [phone])  PCP: [name], MD Patient Care Team: [name], MD as PCP - General (Internal Medicine) Debara Pickett Nadean Corwin, MD as PCP - Cardiology (Cardiology) Nicholas Lose, MD as Consulting Physician (Hematology and Oncology)  Extended Emergency Contact Information Primary Emergency Contact: [facility] Phone: [phone] Mobile Phone: [phone] Relation: Niece Secondary Emergency Contact: [place] Phone: [phone] Mobile Phone: [phone] Relation: Nephew  Code Status: DNR Goals of Care: Advanced Directive information Advanced Directives 02/14/2019  Does Patient Have a Medical Advance Directive? Yes  Type of Advance Directive Out of facility DNR (pink MOST or yellow form);Healthcare Power of Attorney  Does patient want to make changes to medical advance directive? No - Patient declined  Copy of [place] in Chart? -  Would patient like information on creating a medical advance directive? -  Pre-existing out of facility DNR order (yellow form or pink MOST form) -      Chief Complaint  Patient presents with  . New Admit To SNF    Readmission     HPI: Patient is a 86 y.o. female seen today for admission to SNF with [facility] Admitted in the Hospital from 12/29-01/01 for Hypoxia ? Pneumonia Patient has PMH ofTIA in 2010 s/p CEA in 2010, COPD on oxygen at night, Hypertension,GERD,Hypothyroidism, Gastritis and Paget's disease of Nipple Atrial Fibrillation, CHF, bilateral pleural effusions requiring thoracocentesis and right upper lobe nodule suspicious of cancer  Was send to the hospital at her request as she was having SOB and Right Sided Pain Had CT scan of Chest which showed enlargement of spiculated right upper lobe lung mass,now measuring up to 3.5 cm, previously 1.4  Due to her age and Frailty no more work  up with Hospice referal was made She was also treated with Antibiotics for Pneumonia Also Had Renal US for CKD which did not show any acute changes Her Only complain today is Nose bleed since last night. Otherwise stable. No SOB or fever or cough or Chest pain    Past Medical History:  Diagnosis Date  . Arthritis   . Constipation   . COPD (chronic obstructive pulmonary disease) ([place])   . Fatigue   . Frequent UTI   . GERD (gastroesophageal reflux disease)   . History of shingles 2007  . Hyperlipemia   . Hypertension   . Hypothyroidism   . Multiple allergies   . Osteoporosis   . Paget's carcinoma of the nipple ([place]) 03/16/2013  . Pneumonia   . Seasonal allergies   . Stroke [facility]) 2010   no residual  . Thyroid disease   . Uterine cancer [facility])    Past Surgical History:  Procedure Laterality Date  . ABDOMINAL HYSTERECTOMY    . BREAST LUMPECTOMY     left breast  . CAROTID ENDARTERECTOMY Left June 10, 2008   CE  . EYE SURGERY     cataracts removed bilaterally  . TONSILLECTOMY      reports that she quit smoking about 38 years ago. Her smoking use included cigarettes. She has a 40.00 pack-year smoking history. She has never used smokeless tobacco. She reports current alcohol use. She reports that she does not use drugs. Social History   Socioeconomic History  . Marital status: Widowed    Spouse name: Not on file  . Number of children: Not on file  . Years of  education: Not on file  . Highest education level: Not on file  Occupational History  . Not on file  Tobacco Use  . Smoking status: Former Smoker    Packs/day: 1.00    Years: 40.00    Pack years: 40.00    Types: Cigarettes    Quit date: 02/15/1981    Years since quitting: 38.0  . Smokeless tobacco: Never Used  Substance and Sexual Activity  . Alcohol use: Yes  . Drug use: No  . Sexual activity: Not Currently  Other Topics Concern  . Not on file  Social History Narrative   Lives at [place] Strain:   . Difficulty of Paying Living Expenses: Not on file  Food Insecurity:   . Worried About Charity fundraiser in the Last Year: Not on file  . Ran Out of Food in the Last Year: Not on file  Transportation Needs:   . Lack of Transportation (Medical): Not on file  . Lack of Transportation (Non-Medical): Not on file  Physical Activity:   . Days of Exercise per Week: Not on file  . Minutes of Exercise per Session: Not on file  Stress:   . Feeling of Stress : Not on file  Social Connections:   . Frequency of Communication with Friends and Family: Not on file  . Frequency of Social Gatherings with Friends and Family: Not on file  . Attends Religious Services: Not on file  . Active Member of Clubs or Organizations: Not on file  . Attends Archivist Meetings: Not on file  . Marital Status: Not on file  Intimate Partner Violence:   . Fear of Current or Ex-Partner: Not on file  . Emotionally Abused: Not on file  . Physically Abused: Not on file  . Sexually Abused: Not on file    Functional Status Survey:    Family History  Problem Relation Age of Onset  . Heart failure Mother   . Heart disease Mother   . Heart failure Father   . Heart disease Father   . Heart disease Brother   . Asthma Paternal Grandmother     Health Maintenance  Topic Date Due  . Samul Dada  10/03/1936  . PNA vac Low Risk Adult (1 of 2 - PCV13) 10/02/1984  . INFLUENZA VACCINE  Completed  . DEXA SCAN  Completed    Allergies  Allergen Reactions  . Sulfa Antibiotics Shortness Of Breath and Palpitations  . Sulfamethoxazole Shortness Of Breath and Palpitations  . Sulfur Shortness Of Breath and Palpitations    "Allergic," per MAR (although I believe they might've meant "Sulfa"??)  . Amoxicillin Rash  . Penicillin G Rash    Did it involve swelling of the face/tongue/throat, SOB, or low BP? Yes Did it involve sudden or severe  rash/hives, skin peeling, or any reaction on the inside of your mouth or nose? Unk Did you need to seek medical attention at a hospital or doctor's office? Unk When did it last happen? "it was a long time ago" If all above answers are "NO", may proceed with cephalosporin use.   Marland Kitchen Penicillins Rash    Did it involve swelling of the face/tongue/throat, SOB, or low BP? Yes Did it involve sudden or severe rash/hives, skin peeling, or any reaction on the inside of your mouth or nose? Unk Did you need to seek medical attention at a hospital or doctor's office? Unk When did  it last happen? "it was a long time ago" If all above answers are "NO", may proceed with cephalosporin use.   . Tape Other (See Comments)    SKIN IS VERY FRAGILE- PLEASE USE AN ALTERNATIVE TO TAPE, AS THE SKIN TEARS EASILY!!    Allergies as of 02/19/2019      Reactions   Sulfa Antibiotics Shortness Of Breath, Palpitations   Sulfamethoxazole Shortness Of Breath, Palpitations   Sulfur Shortness Of Breath, Palpitations   "Allergic," per MAR (although I believe they might've meant "Sulfa"??)   Amoxicillin Rash   Penicillin G Rash   Did it involve swelling of the face/tongue/throat, SOB, or low BP? Yes Did it involve sudden or severe rash/hives, skin peeling, or any reaction on the inside of your mouth or nose? Unk Did you need to seek medical attention at a hospital or doctor's office? Unk When did it last happen? "it was a long time ago" If all above answers are "NO", may proceed with cephalosporin use.   Penicillins Rash   Did it involve swelling of the face/tongue/throat, SOB, or low BP? Yes Did it involve sudden or severe rash/hives, skin peeling, or any reaction on the inside of your mouth or nose? Unk Did you need to seek medical attention at a hospital or doctor's office? Unk When did it last happen? "it was a long time ago" If all above answers are "NO", may proceed with cephalosporin use.   Tape Other (See  Comments)   SKIN IS VERY FRAGILE- PLEASE USE AN ALTERNATIVE TO TAPE, AS THE SKIN TEARS EASILY!!      Medication List       Accurate as of February 19, 2019  9:55 AM. If you have any questions, ask your nurse or doctor.        acetaminophen 500 MG tablet Commonly known as: TYLENOL Take 500 mg by mouth 2 (two) times daily.   amiodarone 200 MG tablet Commonly known as: PACERONE Take 200 mg by mouth daily.   apixaban 2.5 MG Tabs tablet Commonly known as: ELIQUIS Take 1 tablet (2.5 mg total) by mouth 2 (two) times daily.   cefUROXime 500 MG tablet Commonly known as: CEFTIN Take 1 tablet (500 mg total) by mouth 2 (two) times daily for 5 days.   Demadex 20 MG tablet Generic drug: torsemide Take 20 mg by mouth daily.   diltiazem 120 MG 24 hr capsule Commonly known as: CARDIZEM CD Take 1 capsule (120 mg total) by mouth daily.   ergocalciferol 1.25 MG (50000 UT) capsule Commonly known as: VITAMIN D2 Take 1,250 Units by mouth once a week. Once a day on Sunday   fluticasone 50 MCG/ACT nasal spray Commonly known as: FLONASE Place 2 sprays into both nostrils every morning.   Fluticasone-Salmeterol 250-50 MCG/DOSE Aepb Commonly known as: ADVAIR Inhale 1 puff into the lungs 2 (two) times a day.   ipratropium-albuterol 0.5-2.5 (3) MG/3ML Soln Commonly known as: DUONEB Take 3 mLs by nebulization every 6 (six) hours as needed (WHEEZING).   levothyroxine 100 MCG tablet Commonly known as: SYNTHROID Take 100 mcg by mouth daily before breakfast.   linaclotide 145 MCG Caps capsule Commonly known as: LINZESS Take 145 mcg by mouth every other day.   morphine 10 MG/5ML solution Take 2.5 mLs (5 mg total) by mouth every 2 (two) hours as needed for severe pain.   OXYGEN Inhale 2-4 L into the lungs continuous. SAT MUST BE >90%   pantoprazole 40 MG tablet Commonly known as:  PROTONIX Take 40 mg by mouth daily.   potassium chloride 20 MEQ packet Commonly known as: KLOR-CON Take 20  mEq by mouth daily.   predniSONE 20 MG tablet Commonly known as: Deltasone Take 2 tablets (40 mg total) by mouth daily for 7 days.   sennosides-docusate sodium 8.6-50 MG tablet Commonly known as: SENOKOT-S Take 2 tablets by mouth daily.   sodium chloride 0.65 % Soln nasal spray Commonly known as: OCEAN Place 1 spray into both nostrils. Apply to RT and LT Nostril Twice A Day   tiotropium 18 MCG inhalation capsule Commonly known as: SPIRIVA Place 18 mcg into inhaler and inhale daily.   zinc oxide 20 % ointment Apply 1 application topically as needed for irritation. Apply to buttocks after every incontinent episode and as needed for redness       Review of Systems  Constitutional: Positive for activity change. Negative for appetite change.  HENT: Negative.   Respiratory: Positive for cough and shortness of breath.   Cardiovascular: Negative.   Gastrointestinal: Positive for constipation.  Genitourinary: Negative.   Musculoskeletal: Negative.   Neurological: Positive for weakness.  Psychiatric/Behavioral: Negative.   All other systems reviewed and are negative.   Vitals:   02/19/19 0944  BP: 110/63  Pulse: (!) 56  Resp: 20  Temp: 97.6 F (36.4 C)  SpO2: 98%  Weight: 95 lb (43.1 kg)  Height: 4\' 7"  (1.397 m)   Body mass index is 22.08 kg/m. Physical Exam Vitals reviewed.  Constitutional:      Appearance: Normal appearance.  HENT:     Head: Normocephalic.     Nose: Nose normal.     Mouth/Throat:     Mouth: Mucous membranes are moist.     Pharynx: Oropharynx is clear.  Eyes:     Pupils: Pupils are equal, round, and reactive to light.  Cardiovascular:     Rate and Rhythm: Normal rate.     Pulses: Normal pulses.  Pulmonary:     Effort: Pulmonary effort is normal. No respiratory distress.     Breath sounds: Normal breath sounds. No wheezing.  Abdominal:     General: Abdomen is flat. Bowel sounds are normal.     Palpations: Abdomen is soft.   Musculoskeletal:        General: No swelling.     Cervical back: Neck supple.  Skin:    General: Skin is warm.  Neurological:     General: No focal deficit present.     Mental Status: She is alert and oriented to person, place, and time.  Psychiatric:        Mood and Affect: Mood normal.        Thought Content: Thought content normal.     Labs reviewed: Basic Metabolic Panel: Recent Labs    02/13/19 2029 02/14/19 0339 02/15/19 0353 02/16/19 0432  NA  --  135 138 136  K  --  4.3 4.3 3.9  CL  --  96* 97* 97*  CO2  --  26 29 28   GLUCOSE  --  105* 155* 146*  BUN  --  58* 52* 51*  CREATININE  --  1.98* 2.09* 1.84*  CALCIUM  --  9.1 9.2 9.0  MG 2.2  --  2.2 2.2  PHOS 3.9  --   --   --    Liver Function Tests: Recent Labs    05/02/18 0402 05/11/18 0000 05/19/18 2000 02/13/19 1659  AST 23 11* 19 17  ALT 24 12 19  17  ALKPHOS 56 56 79 89  BILITOT 0.6  --  0.7 0.5  PROT 5.7*  --  6.3* 6.7  ALBUMIN 2.9*  --  3.1* 3.2*   No results for input(s): LIPASE, AMYLASE in the last 8760 hours. No results for input(s): AMMONIA in the last 8760 hours. CBC: Recent Labs    05/19/18 2000 05/21/18 0815 05/29/18 0000 10/05/18 0000 02/14/19 0339 02/15/19 0353 02/16/19 0432  WBC 10.0 12.0*   < > 7.1 9.4 2.9* 10.0  NEUTROABS 7.9* 9.1*  --  4,771  --   --   --   HGB 11.2* 10.4*  --  9.1* 10.2* 9.6* 8.9*  HCT 35.8* 31.9*  --  28* 30.9* 29.5* 26.6*  MCV 94.7 94.4   < >  --  97.5 98.0 96.7  PLT 248 289  --  294 314 311 306   < > = values in this interval not displayed.   Cardiac Enzymes: Recent Labs    03/17/18 1604 04/27/18 1123 05/19/18 2000  TROPONINI <0.03 <0.03 <0.03   BNP: Invalid input(s): POCBNP No results found for: HGBA1C Lab Results  Component Value Date   TSH 0.452 02/13/2019   Lab Results  Component Value Date   ERXVQMGQ67 619 04/07/2018   No results found for: FOLATE Lab Results  Component Value Date   IRON 78 04/06/2018   TIBC 244 04/06/2018     Imaging and Procedures obtained prior to SNF admission: CT Chest Wo Contrast  Result Date: 02/14/2019 CLINICAL DATA:  Shortness of breath EXAM: CT CHEST WITHOUT CONTRAST TECHNIQUE: Multidetector CT imaging of the chest was performed following the standard protocol without IV contrast. COMPARISON:  Chest x-ray 02/13/2019, CT chest 04/29/2018 FINDINGS: Cardiovascular: Limited evaluation without intravenous contrast. Nonaneurysmal aorta. Moderate to marked aortic atherosclerosis. Coronary vascular calcification. Borderline cardiomegaly. No significant pericardial effusion. Mediastinum/Nodes: Midline trachea. Stable 8 mm calcified nodule right lobe of thyroid, no further follow-up recommended. Redemonstrated mediastinal adenopathy. Low right paratracheal region lymph node measures 2.1 cm compared with 2.5 cm previously. Subcarinal node measures 1 cm compared with 16 mm previously. Small right cardio phrenic nodes are unchanged. Lungs/Pleura: Small-moderate right pleural effusion. Scarring at the right lung apex. Interval enlargement of spiculated right upper lobe lung mass, now with small amount of cavitation. Mass measures approximately 3.5 by 2.7 by 2.6 cm, compared with 1.3 x 1.4 cm previously. Mass appears contiguous with linear airspace disease extending to the right hilar region. Bronchiectasis within the right upper lobe, right middle lobe and bilateral lower lobes with bronchial wall thickening. Small scattered foci of slightly nodular appearing airspace disease in the left upper lobe. Mild diffuse septal thickening on the right could indicate mild asymmetric edema. Upper Abdomen: No acute abnormality.  Splenic granuloma. Musculoskeletal: Degenerative changes of the spine. No acute osseous abnormality. IMPRESSION: 1. Interval enlargement of spiculated right upper lobe lung mass, now measuring up to 3.5 cm, previously 1.4 cm. The mass now also demonstrates mild cavitation and remains concerning for  carcinoma. Increasing right parahilar soft tissue density, which may be contiguous with the spiculated mass. Mediastinal adenopathy persists but does not appear progressed. 2. Small moderate right pleural effusion. Interim finding of small scattered ill-defined nodular foci of density, most notable in the left upper lobe, possibly reflecting acute respiratory infection. 3. Emphysema Aortic Atherosclerosis (ICD10-I70.0) and Emphysema (ICD10-J43.9). Electronically Signed   By: Donavan Foil M.D.   On: 02/14/2019 01:07   US Renal  Result Date: 02/13/2019 CLINICAL DATA:  Acute  kidney injury. EXAM: RENAL / URINARY TRACT ULTRASOUND COMPLETE COMPARISON:  None. FINDINGS: Right Kidney: Renal measurements: 8.7 x 3.0 x 2.0 cm = volume: 29 mL. Increased echogenicity. No mass or hydronephrosis visualized. Left Kidney: Renal measurements: 8.1 x 3.1 x 2.4 cm = volume: 32 mL. Increased echogenicity. No mass or hydronephrosis visualized. Bladder: Multiple bladder diverticula noted.  No wall thickening. Other: None. IMPRESSION: 1. Bilateral renal atrophy and increased cortical echogenicity, consistent with underlying medical renal disease. 2. Multiple bladder diverticula. Electronically Signed   By: Titus Dubin M.D.   On: 02/13/2019 21:15   NM Pulmonary Perf and Vent  Result Date: 02/14/2019 CLINICAL DATA:  Evaluate for pulmonary embolus.  History of COPD. EXAM: NUCLEAR MEDICINE VENTILATION - PERFUSION LUNG SCAN TECHNIQUE: Ventilation images were obtained in multiple projections using inhaled aerosol Tc-56m DTPA. Perfusion images were obtained in multiple projections after intravenous injection of Tc-51m MAA. RADIOPHARMACEUTICALS:  39.8 mCi of Tc-76m DTPA aerosol inhalation and 1.5 mCi Tc83m MAA IV COMPARISON:  CT chest 02/14/2019 FINDINGS: Ventilation: Photopenic defect within the right upper lobe corresponds to right upper lobe lung mass identified on recent chest CT. Non uniform soft tissue attenuation artifact is  identified on the lateral projection images as well as the anterior oblique images. Perfusion: No wedge shaped peripheral perfusion defects to suggest acute pulmonary embolism. Photopenic defect within the right upper lobe corresponds to the ventilation and CT abnormality. IMPRESSION: 1. No evidence for acute pulmonary embolus. Electronically Signed   By: Kerby Moors M.D.   On: 02/14/2019 15:15   DG Chest Port 1 View  Result Date: 02/13/2019 CLINICAL DATA:  Shortness of breath for 3 days EXAM: PORTABLE CHEST 1 VIEW COMPARISON:  05/22/2018 FINDINGS: There is a small right pleural effusion. There is a trace left pleural effusion. There bilateral emphysematous changes. There is right upper and right lower lung interstitial and alveolar airspace disease. There is no pneumothorax. The heart mediastinum are stable. There is no acute osseous abnormality. IMPRESSION: 1. Small right pleural effusion. Right upper and right lower lung airspace disease which may reflect atypical pulmonary edema versus multilobar pneumonia. Electronically Signed   By: Kathreen Devoid   On: 02/13/2019 18:14    Assessment/Plan Acute respiratory failure with hypoxia and hypercapnia  Back to baseline On Oxygen   Bronchogenic carcinoma of right lung (HCC) Pain control with Roxanol Hospice referal COPD On Prednisone taper and treated with Antibiotics  Longstanding persistent atrial fibrillation (HCC) On Amiodarone and Cardizem Also oN eliquis  Chronic diastolic congestive heart failure (HCC) Continue on Low dose of Demadex  Stage 3b chronic kidney disease Creat at baseline Renal US was negative  Bleeding from the nose Hold Eliquis for 5 days and then resart Costipation On Linzess Anemia Hgb Low but stable Hypothyroid On Supplement [facility] Hospice Referal Family/ staff Communication:   Labs/tests ordered: Total time spent in this patient care encounter was  45_  minutes; greater than 50% of the visit spent  counseling patient and staff, reviewing records , Labs and coordinating care for problems addressed at this encounter.

## 2019-02-20 DIAGNOSIS — E785 Hyperlipidemia, unspecified: Secondary | ICD-10-CM | POA: Diagnosis not present

## 2019-02-20 DIAGNOSIS — I110 Hypertensive heart disease with heart failure: Secondary | ICD-10-CM | POA: Diagnosis not present

## 2019-02-20 DIAGNOSIS — I5031 Acute diastolic (congestive) heart failure: Secondary | ICD-10-CM | POA: Diagnosis not present

## 2019-02-20 DIAGNOSIS — J449 Chronic obstructive pulmonary disease, unspecified: Secondary | ICD-10-CM | POA: Diagnosis not present

## 2019-02-20 DIAGNOSIS — Z20828 Contact with and (suspected) exposure to other viral communicable diseases: Secondary | ICD-10-CM | POA: Diagnosis not present

## 2019-02-20 DIAGNOSIS — C3411 Malignant neoplasm of upper lobe, right bronchus or lung: Secondary | ICD-10-CM | POA: Diagnosis not present

## 2019-02-20 DIAGNOSIS — I4891 Unspecified atrial fibrillation: Secondary | ICD-10-CM | POA: Diagnosis not present

## 2019-02-21 DIAGNOSIS — J449 Chronic obstructive pulmonary disease, unspecified: Secondary | ICD-10-CM | POA: Diagnosis not present

## 2019-02-21 DIAGNOSIS — E785 Hyperlipidemia, unspecified: Secondary | ICD-10-CM | POA: Diagnosis not present

## 2019-02-21 DIAGNOSIS — C3411 Malignant neoplasm of upper lobe, right bronchus or lung: Secondary | ICD-10-CM | POA: Diagnosis not present

## 2019-02-21 DIAGNOSIS — I5031 Acute diastolic (congestive) heart failure: Secondary | ICD-10-CM | POA: Diagnosis not present

## 2019-02-21 DIAGNOSIS — I4891 Unspecified atrial fibrillation: Secondary | ICD-10-CM | POA: Diagnosis not present

## 2019-02-21 DIAGNOSIS — I110 Hypertensive heart disease with heart failure: Secondary | ICD-10-CM | POA: Diagnosis not present

## 2019-02-26 ENCOUNTER — Non-Acute Institutional Stay (SKILLED_NURSING_FACILITY): Payer: Medicare Other | Admitting: Internal Medicine

## 2019-02-26 ENCOUNTER — Encounter: Payer: Self-pay | Admitting: Internal Medicine

## 2019-02-26 DIAGNOSIS — C3491 Malignant neoplasm of unspecified part of right bronchus or lung: Secondary | ICD-10-CM

## 2019-02-26 DIAGNOSIS — I4811 Longstanding persistent atrial fibrillation: Secondary | ICD-10-CM | POA: Diagnosis not present

## 2019-02-26 DIAGNOSIS — I5032 Chronic diastolic (congestive) heart failure: Secondary | ICD-10-CM | POA: Diagnosis not present

## 2019-02-26 DIAGNOSIS — R040 Epistaxis: Secondary | ICD-10-CM

## 2019-02-26 NOTE — Progress Notes (Signed)
Location:  [place] Room Number: 39A Place of Service:  SNF (31) Provider:  Meredith Staggers L. Lyndel Safe, MD  [name], MD  Patient Care Team: [name], MD as PCP - General (Internal Medicine) Debara Pickett Nadean Corwin, MD as PCP - Cardiology (Cardiology) Nicholas Lose, MD as Consulting Physician (Hematology and Oncology)  Extended Emergency Contact Information Primary Emergency Contact: [facility] Phone: [phone] Mobile Phone: [phone] Relation: Niece Secondary Emergency Contact: [place] Phone: [phone] Mobile Phone: [phone] Relation: Nephew  Code Status:  DNR Goals of care: Advanced Directive information Advanced Directives 02/26/2019  Does Patient Have a Medical Advance Directive? Yes  Type of Paramedic of [place];Out of facility DNR (pink MOST or yellow form)  Does patient want to make changes to medical advance directive? No - Patient declined  Copy of [place] in Chart? Yes - validated most recent copy scanned in chart (See row information)  Would patient like information on creating a medical advance directive? -  Pre-existing out of facility DNR order (yellow form or pink MOST form) Yellow form placed in chart (order not valid for inpatient use)     Chief Complaint  Patient presents with  . Acute Visit    Nosebleed, Weakness and Low Back Pain    HPI:  Pt is a 86 y.o. female seen today for an acute visit for Nose bleed. Not eating and Tired  Admitted in the Hospital from 12/29-01/01 for Hypoxia ? Pneumonia Patient has PMH ofTIA in 2010 s/p CEA in 2010, COPD on oxygen at night, Hypertension,GERD,Hypothyroidism, Gastritis and Paget's disease of Nipple Atrial Fibrillation, CHF, bilateral pleural effusions requiring thoracocentesis and right upper lobe nodule suspicious of cancer  Was send to the hospital at her request as she was having SOB and Right Sided Pain Had CT  scan of Chest which showed enlargement of spiculated right upper lobe lung mass,now measuring up to 3.5 cm, previously 1.4  Due to her age and Frailty no more work up with Hospice referal was made  Today Nurses Noticed that she is more weak. Did not eat any Lunch Also Having off and on Bleeding from her Nose She also c/o Back Pain to me. Denies SOB or Cough Just too tired. No Fever or chills  Past Medical History:  Diagnosis Date  . Arthritis   . Constipation   . COPD (chronic obstructive pulmonary disease) ([place])   . Fatigue   . Frequent UTI   . GERD (gastroesophageal reflux disease)   . History of shingles 2007  . Hyperlipemia   . Hypertension   . Hypothyroidism   . Multiple allergies   . Osteoporosis   . Paget's carcinoma of the nipple ([place]) 03/16/2013  . Pneumonia   . Seasonal allergies   . Stroke [facility]) 2010   no residual  . Thyroid disease   . Uterine [facility])    Past Surgical History:  Procedure Laterality Date  . ABDOMINAL HYSTERECTOMY    . BREAST LUMPECTOMY     left breast  . CAROTID ENDARTERECTOMY Left June 10, 2008   CE  . EYE SURGERY     cataracts removed bilaterally  . TONSILLECTOMY      Allergies  Allergen Reactions  . Sulfa Antibiotics Shortness Of Breath and Palpitations  . Sulfamethoxazole Shortness Of Breath and Palpitations  . Sulfur Shortness Of Breath and Palpitations    "Allergic," per MAR (although I believe they might've meant "Sulfa"??)  . Amoxicillin Rash  .  Penicillin G Rash    Did it involve swelling of the face/tongue/throat, SOB, or low BP? Yes Did it involve sudden or severe rash/hives, skin peeling, or any reaction on the inside of your mouth or nose? Unk Did you need to seek medical attention at a hospital or doctor's office? Unk When did it last happen? "it was a long time ago" If all above answers are "NO", may proceed with cephalosporin use.   Marland Kitchen Penicillins Rash    Did it involve swelling of the face/tongue/throat, SOB,  or low BP? Yes Did it involve sudden or severe rash/hives, skin peeling, or any reaction on the inside of your mouth or nose? Unk Did you need to seek medical attention at a hospital or doctor's office? Unk When did it last happen? "it was a long time ago" If all above answers are "NO", may proceed with cephalosporin use.   . Tape Other (See Comments)    SKIN IS VERY FRAGILE- PLEASE USE AN ALTERNATIVE TO TAPE, AS THE SKIN TEARS EASILY!!    Outpatient Encounter Medications as of 02/26/2019  Medication Sig  . acetaminophen (TYLENOL) 500 MG tablet Take 500 mg by mouth 2 (two) times daily.  Marland Kitchen amiodarone (PACERONE) 200 MG tablet Take 200 mg by mouth daily.  Marland Kitchen apixaban (ELIQUIS) 2.5 MG TABS tablet Take 1 tablet (2.5 mg total) by mouth 2 (two) times daily.  Marland Kitchen diltiazem (CARDIZEM CD) 120 MG 24 hr capsule Take 1 capsule (120 mg total) by mouth daily.  . ergocalciferol (VITAMIN D2) 1.25 MG (50000 UT) capsule Take 1,250 Units by mouth once a week. Once a day on Sunday  . fluticasone (FLONASE) 50 MCG/ACT nasal spray Place 2 sprays into both nostrils every morning.  . Fluticasone-Salmeterol (ADVAIR) 250-50 MCG/DOSE AEPB Inhale 1 puff into the lungs 2 (two) times a day.   . ipratropium-albuterol (DUONEB) 0.5-2.5 (3) MG/3ML SOLN Take 3 mLs by nebulization every 6 (six) hours as needed (WHEEZING).  Marland Kitchen levothyroxine (SYNTHROID) 100 MCG tablet Take 100 mcg by mouth daily before breakfast.  . linaclotide (LINZESS) 145 MCG CAPS capsule Take 145 mcg by mouth every other day.  . morphine 10 MG/5ML solution Take 2.5 mLs (5 mg total) by mouth every 2 (two) hours as needed for severe pain.  . OXYGEN Inhale 2-4 L into the lungs continuous. SAT MUST BE >90%  . pantoprazole (PROTONIX) 40 MG tablet Take 40 mg by mouth daily.   . potassium chloride (KLOR-CON) 20 MEQ packet Take 20 mEq by mouth daily.   . sennosides-docusate sodium (SENOKOT-S) 8.6-50 MG tablet Take 2 tablets by mouth daily.  . sodium chloride (OCEAN) 0.65  % SOLN nasal spray Place 1 spray into both nostrils. Apply to RT and LT Nostril Twice A Day  . tiotropium (SPIRIVA) 18 MCG inhalation capsule Place 18 mcg into inhaler and inhale daily.  Marland Kitchen torsemide (DEMADEX) 20 MG tablet Take 20 mg by mouth daily.  Marland Kitchen zinc oxide 20 % ointment Apply 1 application topically as needed for irritation. Apply to buttocks after every incontinent episode and as needed for redness   No facility-administered encounter medications on file as of 02/26/2019.    Review of Systems  Constitutional: Positive for appetite change.  HENT: Negative.   Respiratory: Positive for cough.   Cardiovascular: Negative.   Gastrointestinal: Negative.   Genitourinary: Negative.   Musculoskeletal: Positive for back pain.  Neurological: Positive for weakness.  Psychiatric/Behavioral: Positive for dysphoric mood.    Immunization History  Administered Date(s) Administered  .  Influenza, High Dose Seasonal PF 11/30/2018  . Influenza-Unspecified 11/15/2013  . PPD Test 07/24/2011  . Zoster Recombinat (Shingrix) 12/10/2017   Pertinent  Health Maintenance Due  Topic Date Due  . PNA vac Low Risk Adult (1 of 2 - PCV13) 10/02/1984  . INFLUENZA VACCINE  Completed  . DEXA SCAN  Completed   Fall Risk  01/28/2014  Falls in the past year? Yes  Number falls in past yr: 1  Injury with Fall? No   Functional Status Survey:    Vitals:   02/26/19 1523  BP: (!) 114/58  Pulse: 68  Resp: 18  Temp: (!) 96.9 F (36.1 C)  TempSrc: Oral  SpO2: 93%  Weight: 95 lb (43.1 kg)  Height: 4\' 10"  (1.473 m)   Body mass index is 19.86 kg/m. Physical Exam Vitals reviewed.  HENT:     Head: Normocephalic.     Nose: Nose normal.     Mouth/Throat:     Mouth: Mucous membranes are moist.  Eyes:     Pupils: Pupils are equal, round, and reactive to light.  Cardiovascular:     Rate and Rhythm: Normal rate. Rhythm irregular.     Pulses: Normal pulses.  Pulmonary:     Effort: Pulmonary effort is  normal. No respiratory distress.     Breath sounds: No wheezing.  Abdominal:     General: Abdomen is flat. Bowel sounds are normal.     Palpations: Abdomen is soft.  Musculoskeletal:        General: No swelling.     Cervical back: Neck supple.  Skin:    General: Skin is warm.  Neurological:     General: No focal deficit present.     Mental Status: She is alert and oriented to person, place, and time.  Psychiatric:        Mood and Affect: Mood normal.        Thought Content: Thought content normal.     Labs reviewed: Recent Labs    02/13/19 2029 02/14/19 0339 02/15/19 0353 02/16/19 0432  NA  --  135 138 136  K  --  4.3 4.3 3.9  CL  --  96* 97* 97*  CO2  --  26 29 28   GLUCOSE  --  105* 155* 146*  BUN  --  58* 52* 51*  CREATININE  --  1.98* 2.09* 1.84*  CALCIUM  --  9.1 9.2 9.0  MG 2.2  --  2.2 2.2  PHOS 3.9  --   --   --    Recent Labs    05/02/18 0402 05/11/18 0000 05/19/18 2000 02/13/19 1659  AST 23 11* 19 17  ALT 24 12 19 17   ALKPHOS 56 56 79 89  BILITOT 0.6  --  0.7 0.5  PROT 5.7*  --  6.3* 6.7  ALBUMIN 2.9*  --  3.1* 3.2*   Recent Labs    05/19/18 2000 05/21/18 0815 05/29/18 0000 10/05/18 0000 02/14/19 0339 02/15/19 0353 02/16/19 0432  WBC 10.0 12.0*   < > 7.1 9.4 2.9* 10.0  NEUTROABS 7.9* 9.1*  --  4,771  --   --   --   HGB 11.2* 10.4*  --  9.1* 10.2* 9.6* 8.9*  HCT 35.8* 31.9*  --  28* 30.9* 29.5* 26.6*  MCV 94.7 94.4   < >  --  97.5 98.0 96.7  PLT 248 289  --  294 314 311 306   < > = values in this interval not  displayed.   Lab Results  Component Value Date   TSH 0.452 02/13/2019   No results found for: HGBA1C No results found for: CHOL, HDL, LDLCALC, LDLDIRECT, TRIG, CHOLHDL  Significant Diagnostic Results in last 30 days:  CT Chest Wo Contrast  Result Date: 02/14/2019 CLINICAL DATA:  Shortness of breath EXAM: CT CHEST WITHOUT CONTRAST TECHNIQUE: Multidetector CT imaging of the chest was performed following the standard protocol  without IV contrast. COMPARISON:  Chest x-ray 02/13/2019, CT chest 04/29/2018 FINDINGS: Cardiovascular: Limited evaluation without intravenous contrast. Nonaneurysmal aorta. Moderate to marked aortic atherosclerosis. Coronary vascular calcification. Borderline cardiomegaly. No significant pericardial effusion. Mediastinum/Nodes: Midline trachea. Stable 8 mm calcified nodule right lobe of thyroid, no further follow-up recommended. Redemonstrated mediastinal adenopathy. Low right paratracheal region lymph node measures 2.1 cm compared with 2.5 cm previously. Subcarinal node measures 1 cm compared with 16 mm previously. Small right cardio phrenic nodes are unchanged. Lungs/Pleura: Small-moderate right pleural effusion. Scarring at the right lung apex. Interval enlargement of spiculated right upper lobe lung mass, now with small amount of cavitation. Mass measures approximately 3.5 by 2.7 by 2.6 cm, compared with 1.3 x 1.4 cm previously. Mass appears contiguous with linear airspace disease extending to the right hilar region. Bronchiectasis within the right upper lobe, right middle lobe and bilateral lower lobes with bronchial wall thickening. Small scattered foci of slightly nodular appearing airspace disease in the left upper lobe. Mild diffuse septal thickening on the right could indicate mild asymmetric edema. Upper Abdomen: No acute abnormality.  Splenic granuloma. Musculoskeletal: Degenerative changes of the spine. No acute osseous abnormality. IMPRESSION: 1. Interval enlargement of spiculated right upper lobe lung mass, now measuring up to 3.5 cm, previously 1.4 cm. The mass now also demonstrates mild cavitation and remains concerning for carcinoma. Increasing right parahilar soft tissue density, which may be contiguous with the spiculated mass. Mediastinal adenopathy persists but does not appear progressed. 2. Small moderate right pleural effusion. Interim finding of small scattered ill-defined nodular foci of  density, most notable in the left upper lobe, possibly reflecting acute respiratory infection. 3. Emphysema Aortic Atherosclerosis (ICD10-I70.0) and Emphysema (ICD10-J43.9). Electronically Signed   By: Donavan Foil M.D.   On: 02/14/2019 01:07   US Renal  Result Date: 02/13/2019 CLINICAL DATA:  Acute kidney injury. EXAM: RENAL / URINARY TRACT ULTRASOUND COMPLETE COMPARISON:  None. FINDINGS: Right Kidney: Renal measurements: 8.7 x 3.0 x 2.0 cm = volume: 29 mL. Increased echogenicity. No mass or hydronephrosis visualized. Left Kidney: Renal measurements: 8.1 x 3.1 x 2.4 cm = volume: 32 mL. Increased echogenicity. No mass or hydronephrosis visualized. Bladder: Multiple bladder diverticula noted.  No wall thickening. Other: None. IMPRESSION: 1. Bilateral renal atrophy and increased cortical echogenicity, consistent with underlying medical renal disease. 2. Multiple bladder diverticula. Electronically Signed   By: Titus Dubin M.D.   On: 02/13/2019 21:15   NM Pulmonary Perf and Vent  Result Date: 02/14/2019 CLINICAL DATA:  Evaluate for pulmonary embolus.  History of COPD. EXAM: NUCLEAR MEDICINE VENTILATION - PERFUSION LUNG SCAN TECHNIQUE: Ventilation images were obtained in multiple projections using inhaled aerosol Tc-63m DTPA. Perfusion images were obtained in multiple projections after intravenous injection of Tc-79m MAA. RADIOPHARMACEUTICALS:  39.8 mCi of Tc-50m DTPA aerosol inhalation and 1.5 mCi Tc25m MAA IV COMPARISON:  CT chest 02/14/2019 FINDINGS: Ventilation: Photopenic defect within the right upper lobe corresponds to right upper lobe lung mass identified on recent chest CT. Non uniform soft tissue attenuation artifact is identified on the lateral projection images as  well as the anterior oblique images. Perfusion: No wedge shaped peripheral perfusion defects to suggest acute pulmonary embolism. Photopenic defect within the right upper lobe corresponds to the ventilation and CT abnormality.  IMPRESSION: 1. No evidence for acute pulmonary embolus. Electronically Signed   By: Kerby Moors M.D.   On: 02/14/2019 15:15   DG Chest Port 1 View  Result Date: 02/13/2019 CLINICAL DATA:  Shortness of breath for 3 days EXAM: PORTABLE CHEST 1 VIEW COMPARISON:  05/22/2018 FINDINGS: There is a small right pleural effusion. There is a trace left pleural effusion. There bilateral emphysematous changes. There is right upper and right lower lung interstitial and alveolar airspace disease. There is no pneumothorax. The heart mediastinum are stable. There is no acute osseous abnormality. IMPRESSION: 1. Small right pleural effusion. Right upper and right lower lung airspace disease which may reflect atypical pulmonary edema versus multilobar pneumonia. Electronically Signed   By: Kathreen Devoid   On: 02/13/2019 18:14    Assessment/Plan  Bronchogenic carcinoma of right lung ([place]) D/W the Staff Will Continue Roxanol Also Compassionate visit from her Neice  COPD ON Spiriva   Longstanding persistent atrial fibrillation (HCC) Discontinue Eliquis Decrease the dose of Amiodarone to 100 mgto see if ti helps her appetite Nose Bleed Discontinue Eliquis for now Chronic diastolic congestive heart failure (HCC) Continue on Low dose of Demadex Will Decrease the dose if she continues to loose appetite  Stage 3b chronic kidney disease Creat at baseline Renal US was negative  Costipation On Linzess Hypothyroid On Supplement [facility] Hospice Referal  Family/ staff Communication:   Labs/tests ordered:   Total time spent in this patient care encounter was  25_  minutes; greater than 50% of the visit spent counseling patient and staff, reviewing records , Labs and coordinating care for problems addressed at this encounter.

## 2019-02-27 ENCOUNTER — Non-Acute Institutional Stay (SKILLED_NURSING_FACILITY): Payer: Medicare Other | Admitting: Nurse Practitioner

## 2019-02-27 ENCOUNTER — Encounter: Payer: Self-pay | Admitting: Nurse Practitioner

## 2019-02-27 DIAGNOSIS — C3411 Malignant neoplasm of upper lobe, right bronchus or lung: Secondary | ICD-10-CM | POA: Diagnosis not present

## 2019-02-27 DIAGNOSIS — R911 Solitary pulmonary nodule: Secondary | ICD-10-CM

## 2019-02-27 DIAGNOSIS — I5032 Chronic diastolic (congestive) heart failure: Secondary | ICD-10-CM | POA: Diagnosis not present

## 2019-02-27 DIAGNOSIS — J449 Chronic obstructive pulmonary disease, unspecified: Secondary | ICD-10-CM

## 2019-02-27 DIAGNOSIS — K5901 Slow transit constipation: Secondary | ICD-10-CM | POA: Diagnosis not present

## 2019-02-27 DIAGNOSIS — I5031 Acute diastolic (congestive) heart failure: Secondary | ICD-10-CM | POA: Diagnosis not present

## 2019-02-27 DIAGNOSIS — I4891 Unspecified atrial fibrillation: Secondary | ICD-10-CM | POA: Diagnosis not present

## 2019-02-27 DIAGNOSIS — Z20828 Contact with and (suspected) exposure to other viral communicable diseases: Secondary | ICD-10-CM | POA: Diagnosis not present

## 2019-02-27 DIAGNOSIS — I4811 Longstanding persistent atrial fibrillation: Secondary | ICD-10-CM

## 2019-02-27 DIAGNOSIS — I110 Hypertensive heart disease with heart failure: Secondary | ICD-10-CM | POA: Diagnosis not present

## 2019-02-27 DIAGNOSIS — E785 Hyperlipidemia, unspecified: Secondary | ICD-10-CM | POA: Diagnosis not present

## 2019-02-27 NOTE — Assessment & Plan Note (Addendum)
02/14/19 hospital CT enlarfed right upper lung nodule, right pleural effusion.  Hospice care, Morphine 5mg  q2hr prn available to her.

## 2019-02-27 NOTE — Progress Notes (Signed)
Location:   [place] Room Number: 39 Place of Service:  SNF (31) Provider:  Kyasia Steuck NP  [name], MD  Patient Care Team: [name], MD as PCP - General (Internal Medicine) Pixie Casino, MD as PCP - Cardiology (Cardiology) Nicholas Lose, MD as Consulting Physician (Hematology and Oncology)  Extended Emergency Contact Information Primary Emergency Contact: [facility] Phone: [phone] Mobile Phone: [phone] Relation: Niece Secondary Emergency Contact: [place] Phone: [phone] Mobile Phone: [phone] Relation: Nephew  Code Status:  DNR Goals of care: Advanced Directive information Advanced Directives 02/26/2019  Does Patient Have a Medical Advance Directive? Yes  Type of [place] of [place];Out of facility DNR (pink MOST or yellow form)  Does patient want to make changes to medical advance directive? No - Patient declined  Copy of [place] in Chart? Yes - validated most recent copy scanned in chart (See row information)  Would patient like information on creating a medical advance directive? -  Pre-existing out of facility DNR order (yellow form or pink MOST form) Yellow form placed in chart (order not valid for inpatient use)     Chief Complaint  Patient presents with  . Acute Visit    Anticoagulation    HPI:  Pt is a 86 y.o. female seen today for an acute visit  for question about anticoagulation for VTE risk reduction in setting of Afib. Eliquis was held then discontinued due to nose bleed. AFib, heart rate is in control, on Diltiazem 120mg  qd, Amiodarone 100mg  qd. She is under Hospice service for comfort measures, taking Tylenol 500mg  bid, prn Morphine 5mg  q2hr prn. COPD, O2 dependent, on Advair bid, Spiriva qd,  DuoNeb q6h prn. Constipation, on Linzess qod, Senokot S II qd. CHF, chronic DOE, on Torsemide 20mg  qd.    Past Medical History:  Diagnosis Date    . Arthritis   . Constipation   . COPD (chronic obstructive pulmonary disease) ([place])   . Fatigue   . Frequent UTI   . GERD (gastroesophageal reflux disease)   . History of shingles 2007  . Hyperlipemia   . Hypertension   . Hypothyroidism   . Multiple allergies   . Osteoporosis   . Paget's carcinoma of the nipple ([place]) 03/16/2013  . Pneumonia   . Seasonal allergies   . Stroke [facility]) 2010   no residual  . Thyroid disease   . Uterine cancer [facility])    Past Surgical History:  Procedure Laterality Date  . ABDOMINAL HYSTERECTOMY    . BREAST LUMPECTOMY     left breast  . CAROTID ENDARTERECTOMY Left June 10, 2008   CE  . EYE SURGERY     cataracts removed bilaterally  . TONSILLECTOMY      Allergies  Allergen Reactions  . Sulfa Antibiotics Shortness Of Breath and Palpitations  . Sulfamethoxazole Shortness Of Breath and Palpitations  . Sulfur Shortness Of Breath and Palpitations    "Allergic," per MAR (although I believe they might've meant "Sulfa"??)  . Amoxicillin Rash  . Penicillin G Rash    Did it involve swelling of the face/tongue/throat, SOB, or low BP? Yes Did it involve sudden or severe rash/hives, skin peeling, or any reaction on the inside of your mouth or nose? Unk Did you need to seek medical attention at a hospital or doctor's office? Unk When did it last happen? "it was a long time ago" If all above answers are "NO", may proceed with cephalosporin  use.   . Penicillins Rash    Did it involve swelling of the face/tongue/throat, SOB, or low BP? Yes Did it involve sudden or severe rash/hives, skin peeling, or any reaction on the inside of your mouth or nose? Unk Did you need to seek medical attention at a hospital or doctor's office? Unk When did it last happen? "it was a long time ago" If all above answers are "NO", may proceed with cephalosporin use.   . Tape Other (See Comments)    SKIN IS VERY FRAGILE- PLEASE USE AN ALTERNATIVE TO TAPE, AS THE SKIN TEARS  EASILY!!    Allergies as of 02/27/2019      Reactions   Sulfa Antibiotics Shortness Of Breath, Palpitations   Sulfamethoxazole Shortness Of Breath, Palpitations   Sulfur Shortness Of Breath, Palpitations   "Allergic," per MAR (although I believe they might've meant "Sulfa"??)   Amoxicillin Rash   Penicillin G Rash   Did it involve swelling of the face/tongue/throat, SOB, or low BP? Yes Did it involve sudden or severe rash/hives, skin peeling, or any reaction on the inside of your mouth or nose? Unk Did you need to seek medical attention at a hospital or doctor's office? Unk When did it last happen? "it was a long time ago" If all above answers are "NO", may proceed with cephalosporin use.   Penicillins Rash   Did it involve swelling of the face/tongue/throat, SOB, or low BP? Yes Did it involve sudden or severe rash/hives, skin peeling, or any reaction on the inside of your mouth or nose? Unk Did you need to seek medical attention at a hospital or doctor's office? Unk When did it last happen? "it was a long time ago" If all above answers are "NO", may proceed with cephalosporin use.   Tape Other (See Comments)   SKIN IS VERY FRAGILE- PLEASE USE AN ALTERNATIVE TO TAPE, AS THE SKIN TEARS EASILY!!      Medication List       Accurate as of February 27, 2019  3:52 PM. If you have any questions, ask your nurse or doctor.        STOP taking these medications   apixaban 2.5 MG Tabs tablet Commonly known as: ELIQUIS Stopped by: Kimari Coudriet X Amariya Liskey, NP   ergocalciferol 1.25 MG (50000 UT) capsule Commonly known as: VITAMIN D2 Stopped by: Al Bracewell X Tasheena Wambolt, NP     TAKE these medications   acetaminophen 500 MG tablet Commonly known as: TYLENOL Take 500 mg by mouth 2 (two) times daily.   amiodarone 100 MG tablet Commonly known as: PACERONE Take 100 mg by mouth daily.   aspirin 81 MG chewable tablet Chew by mouth daily.   Demadex 20 MG tablet Generic drug: torsemide Take 20 mg by mouth  daily.   diltiazem 120 MG 24 hr capsule Commonly known as: CARDIZEM CD Take 1 capsule (120 mg total) by mouth daily.   fluticasone 50 MCG/ACT nasal spray Commonly known as: FLONASE Place 2 sprays into both nostrils every morning.   Fluticasone-Salmeterol 250-50 MCG/DOSE Aepb Commonly known as: ADVAIR Inhale 1 puff into the lungs 2 (two) times a day.   ipratropium-albuterol 0.5-2.5 (3) MG/3ML Soln Commonly known as: DUONEB Take 3 mLs by nebulization every 6 (six) hours as needed (WHEEZING).   levothyroxine 100 MCG tablet Commonly known as: SYNTHROID Take 100 mcg by mouth daily before breakfast.   linaclotide 145 MCG Caps capsule Commonly known as: LINZESS Take 145 mcg by mouth every other day.  morphine 10 MG/5ML solution Take 2.5 mLs (5 mg total) by mouth every 2 (two) hours as needed for severe pain.   OXYGEN Inhale 2-4 L into the lungs continuous. SAT MUST BE >90%   pantoprazole 40 MG tablet Commonly known as: PROTONIX Take 40 mg by mouth daily.   potassium chloride 20 MEQ packet Commonly known as: KLOR-CON Take 20 mEq by mouth daily.   sennosides-docusate sodium 8.6-50 MG tablet Commonly known as: SENOKOT-S Take 2 tablets by mouth daily.   sodium chloride 0.65 % Soln nasal spray Commonly known as: OCEAN Place 1 spray into both nostrils. Apply to RT and LT Nostril Twice A Day   tiotropium 18 MCG inhalation capsule Commonly known as: SPIRIVA Place 18 mcg into inhaler and inhale daily.   zinc oxide 20 % ointment Apply 1 application topically as needed for irritation. Apply to buttocks after every incontinent episode and as needed for redness       Review of Systems  Constitutional: Positive for fatigue. Negative for activity change, appetite change, chills, diaphoresis and fever.  HENT: Positive for hearing loss. Negative for congestion and voice change.   Eyes: Negative for visual disturbance.  Respiratory: Positive for shortness of breath. Negative  for cough and wheezing.        DOE  Cardiovascular: Negative for chest pain, palpitations and leg swelling.  Gastrointestinal: Positive for constipation. Negative for abdominal distention, diarrhea, nausea and vomiting.  Genitourinary: Negative for difficulty urinating, dysuria and urgency.  Musculoskeletal: Positive for arthralgias and gait problem.  Skin: Negative for color change and pallor.  Neurological: Negative for dizziness, speech difficulty, weakness and headaches.  Psychiatric/Behavioral: Negative for agitation, behavioral problems, hallucinations and sleep disturbance. The patient is not nervous/anxious.     Immunization History  Administered Date(s) Administered  . Influenza, High Dose Seasonal PF 11/30/2018  . Influenza-Unspecified 11/15/2013  . PPD Test 07/24/2011  . Zoster Recombinat (Shingrix) 12/10/2017   Pertinent  Health Maintenance Due  Topic Date Due  . PNA vac Low Risk Adult (1 of 2 - PCV13) 10/02/1984  . INFLUENZA VACCINE  Completed  . DEXA SCAN  Completed   Fall Risk  01/28/2014  Falls in the past year? Yes  Number falls in past yr: 1  Injury with Fall? No   Functional Status Survey:    Vitals:   02/27/19 1339  BP: (!) 114/58  Pulse: 63  Resp: 18  Temp: 97.9 F (36.6 C)  SpO2: 96%  Weight: 95 lb (43.1 kg)  Height: 4\' 10"  (1.473 m)   Body mass index is 19.86 kg/m. Physical Exam Vitals and nursing note reviewed.  Constitutional:      General: She is not in acute distress.    Appearance: Normal appearance. She is not ill-appearing, toxic-appearing or diaphoretic.     Comments: Frail.   HENT:     Head: Normocephalic and atraumatic.     Nose: Nose normal.     Mouth/Throat:     Mouth: Mucous membranes are moist.  Eyes:     Extraocular Movements: Extraocular movements intact.     Conjunctiva/sclera: Conjunctivae normal.     Pupils: Pupils are equal, round, and reactive to light.  Cardiovascular:     Rate and Rhythm: Normal rate. Rhythm  irregular.     Heart sounds: No murmur.  Pulmonary:     Breath sounds: Rales present. No wheezing or rhonchi.     Comments: O2 dependent, a few scattered rales posterior mid to low lungs.  Chest:  Chest wall: No tenderness.  Abdominal:     General: Bowel sounds are normal. There is no distension.     Palpations: Abdomen is soft.     Tenderness: There is no abdominal tenderness. There is no right CVA tenderness, left CVA tenderness, guarding or rebound.  Musculoskeletal:     Right lower leg: No edema.     Left lower leg: No edema.  Skin:    General: Skin is warm and dry.  Neurological:     General: No focal deficit present.     Mental Status: She is alert and oriented to person, place, and time. Mental status is at baseline.     Motor: No weakness.     Coordination: Coordination normal.     Gait: Gait abnormal.  Psychiatric:        Mood and Affect: Mood normal.        Behavior: Behavior normal.        Thought Content: Thought content normal.        Judgment: Judgment normal.     Labs reviewed: Recent Labs    02/13/19 2029 02/14/19 0339 02/15/19 0353 02/16/19 0432  NA  --  135 138 136  K  --  4.3 4.3 3.9  CL  --  96* 97* 97*  CO2  --  26 29 28   GLUCOSE  --  105* 155* 146*  BUN  --  58* 52* 51*  CREATININE  --  1.98* 2.09* 1.84*  CALCIUM  --  9.1 9.2 9.0  MG 2.2  --  2.2 2.2  PHOS 3.9  --   --   --    Recent Labs    05/02/18 0402 05/11/18 0000 05/19/18 2000 02/13/19 1659  AST 23 11* 19 17  ALT 24 12 19 17   ALKPHOS 56 56 79 89  BILITOT 0.6  --  0.7 0.5  PROT 5.7*  --  6.3* 6.7  ALBUMIN 2.9*  --  3.1* 3.2*   Recent Labs    05/19/18 2000 05/21/18 0815 05/29/18 0000 10/05/18 0000 02/14/19 0339 02/15/19 0353 02/16/19 0432  WBC 10.0 12.0*   < > 7.1 9.4 2.9* 10.0  NEUTROABS 7.9* 9.1*  --  4,771  --   --   --   HGB 11.2* 10.4*  --  9.1* 10.2* 9.6* 8.9*  HCT 35.8* 31.9*  --  28* 30.9* 29.5* 26.6*  MCV 94.7 94.4   < >  --  97.5 98.0 96.7  PLT 248 289   --  294 314 311 306   < > = values in this interval not displayed.   Lab Results  Component Value Date   TSH 0.452 02/13/2019   No results found for: HGBA1C No results found for: CHOL, HDL, LDLCALC, LDLDIRECT, TRIG, CHOLHDL  Significant Diagnostic Results in last 30 days:  CT Chest Wo Contrast  Result Date: 02/14/2019 CLINICAL DATA:  Shortness of breath EXAM: CT CHEST WITHOUT CONTRAST TECHNIQUE: Multidetector CT imaging of the chest was performed following the standard protocol without IV contrast. COMPARISON:  Chest x-ray 02/13/2019, CT chest 04/29/2018 FINDINGS: Cardiovascular: Limited evaluation without intravenous contrast. Nonaneurysmal aorta. Moderate to marked aortic atherosclerosis. Coronary vascular calcification. Borderline cardiomegaly. No significant pericardial effusion. Mediastinum/Nodes: Midline trachea. Stable 8 mm calcified nodule right lobe of thyroid, no further follow-up recommended. Redemonstrated mediastinal adenopathy. Low right paratracheal region lymph node measures 2.1 cm compared with 2.5 cm previously. Subcarinal node measures 1 cm compared with 16 mm previously. Small right cardio phrenic  nodes are unchanged. Lungs/Pleura: Small-moderate right pleural effusion. Scarring at the right lung apex. Interval enlargement of spiculated right upper lobe lung mass, now with small amount of cavitation. Mass measures approximately 3.5 by 2.7 by 2.6 cm, compared with 1.3 x 1.4 cm previously. Mass appears contiguous with linear airspace disease extending to the right hilar region. Bronchiectasis within the right upper lobe, right middle lobe and bilateral lower lobes with bronchial wall thickening. Small scattered foci of slightly nodular appearing airspace disease in the left upper lobe. Mild diffuse septal thickening on the right could indicate mild asymmetric edema. Upper Abdomen: No acute abnormality.  Splenic granuloma. Musculoskeletal: Degenerative changes of the spine. No acute  osseous abnormality. IMPRESSION: 1. Interval enlargement of spiculated right upper lobe lung mass, now measuring up to 3.5 cm, previously 1.4 cm. The mass now also demonstrates mild cavitation and remains concerning for carcinoma. Increasing right parahilar soft tissue density, which may be contiguous with the spiculated mass. Mediastinal adenopathy persists but does not appear progressed. 2. Small moderate right pleural effusion. Interim finding of small scattered ill-defined nodular foci of density, most notable in the left upper lobe, possibly reflecting acute respiratory infection. 3. Emphysema Aortic Atherosclerosis (ICD10-I70.0) and Emphysema (ICD10-J43.9). Electronically Signed   By: Donavan Foil M.D.   On: 02/14/2019 01:07   US Renal  Result Date: 02/13/2019 CLINICAL DATA:  Acute kidney injury. EXAM: RENAL / URINARY TRACT ULTRASOUND COMPLETE COMPARISON:  None. FINDINGS: Right Kidney: Renal measurements: 8.7 x 3.0 x 2.0 cm = volume: 29 mL. Increased echogenicity. No mass or hydronephrosis visualized. Left Kidney: Renal measurements: 8.1 x 3.1 x 2.4 cm = volume: 32 mL. Increased echogenicity. No mass or hydronephrosis visualized. Bladder: Multiple bladder diverticula noted.  No wall thickening. Other: None. IMPRESSION: 1. Bilateral renal atrophy and increased cortical echogenicity, consistent with underlying medical renal disease. 2. Multiple bladder diverticula. Electronically Signed   By: Titus Dubin M.D.   On: 02/13/2019 21:15   NM Pulmonary Perf and Vent  Result Date: 02/14/2019 CLINICAL DATA:  Evaluate for pulmonary embolus.  History of COPD. EXAM: NUCLEAR MEDICINE VENTILATION - PERFUSION LUNG SCAN TECHNIQUE: Ventilation images were obtained in multiple projections using inhaled aerosol Tc-47m DTPA. Perfusion images were obtained in multiple projections after intravenous injection of Tc-61m MAA. RADIOPHARMACEUTICALS:  39.8 mCi of Tc-46m DTPA aerosol inhalation and 1.5 mCi Tc100m MAA IV  COMPARISON:  CT chest 02/14/2019 FINDINGS: Ventilation: Photopenic defect within the right upper lobe corresponds to right upper lobe lung mass identified on recent chest CT. Non uniform soft tissue attenuation artifact is identified on the lateral projection images as well as the anterior oblique images. Perfusion: No wedge shaped peripheral perfusion defects to suggest acute pulmonary embolism. Photopenic defect within the right upper lobe corresponds to the ventilation and CT abnormality. IMPRESSION: 1. No evidence for acute pulmonary embolus. Electronically Signed   By: Kerby Moors M.D.   On: 02/14/2019 15:15   DG Chest Port 1 View  Result Date: 02/13/2019 CLINICAL DATA:  Shortness of breath for 3 days EXAM: PORTABLE CHEST 1 VIEW COMPARISON:  05/22/2018 FINDINGS: There is a small right pleural effusion. There is a trace left pleural effusion. There bilateral emphysematous changes. There is right upper and right lower lung interstitial and alveolar airspace disease. There is no pneumothorax. The heart mediastinum are stable. There is no acute osseous abnormality. IMPRESSION: 1. Small right pleural effusion. Right upper and right lower lung airspace disease which may reflect atypical pulmonary edema versus multilobar pneumonia. Electronically  Signed   By: Kathreen Devoid   On: 02/13/2019 18:14    Assessment/Plan Atrial fibrillation (HCC) Heart rate is in control, continue Diltiazem, Amiodarone, will add ASA 81mg  qd for VTE risk reduction.   Diastolic CHF (HCC) Chronic DOE, O2 dependent, continue Torsemide.   COPD (chronic obstructive pulmonary disease) (HCC) O2 dependent, continue  Advair bid, Spiriva qd,  DuoNeb q6h prn.   Slow transit constipation Had prune juice in addition to Linzess qod, Senokot S II qd, observe.   Nodule of upper lobe of right lung 02/14/19 hospital CT enlarfed right upper lung nodule, right pleural effusion.  Hospice care, Morphine 5mg  q2hr prn available to her.       Family/ staff Communication: plan of care reviewed with the patient and charge nurse.   Labs/tests ordered:  none  Time spend 25 minutes.

## 2019-02-27 NOTE — Assessment & Plan Note (Signed)
Heart rate is in control, continue Diltiazem, Amiodarone, will add ASA 81mg  qd for VTE risk reduction.

## 2019-02-27 NOTE — Assessment & Plan Note (Signed)
Chronic DOE, O2 dependent, continue Torsemide.

## 2019-02-27 NOTE — Assessment & Plan Note (Signed)
Had prune juice in addition to Linzess qod, Senokot S II qd, observe.

## 2019-02-27 NOTE — Assessment & Plan Note (Signed)
O2 dependent, continue  Advair bid, Spiriva qd,  DuoNeb q6h prn.

## 2019-03-02 DIAGNOSIS — J449 Chronic obstructive pulmonary disease, unspecified: Secondary | ICD-10-CM | POA: Diagnosis not present

## 2019-03-02 DIAGNOSIS — C3411 Malignant neoplasm of upper lobe, right bronchus or lung: Secondary | ICD-10-CM | POA: Diagnosis not present

## 2019-03-02 DIAGNOSIS — I110 Hypertensive heart disease with heart failure: Secondary | ICD-10-CM | POA: Diagnosis not present

## 2019-03-02 DIAGNOSIS — E785 Hyperlipidemia, unspecified: Secondary | ICD-10-CM | POA: Diagnosis not present

## 2019-03-02 DIAGNOSIS — I5031 Acute diastolic (congestive) heart failure: Secondary | ICD-10-CM | POA: Diagnosis not present

## 2019-03-02 DIAGNOSIS — I4891 Unspecified atrial fibrillation: Secondary | ICD-10-CM | POA: Diagnosis not present

## 2019-03-06 DIAGNOSIS — I5031 Acute diastolic (congestive) heart failure: Secondary | ICD-10-CM | POA: Diagnosis not present

## 2019-03-06 DIAGNOSIS — I4891 Unspecified atrial fibrillation: Secondary | ICD-10-CM | POA: Diagnosis not present

## 2019-03-06 DIAGNOSIS — J449 Chronic obstructive pulmonary disease, unspecified: Secondary | ICD-10-CM | POA: Diagnosis not present

## 2019-03-06 DIAGNOSIS — E785 Hyperlipidemia, unspecified: Secondary | ICD-10-CM | POA: Diagnosis not present

## 2019-03-06 DIAGNOSIS — I110 Hypertensive heart disease with heart failure: Secondary | ICD-10-CM | POA: Diagnosis not present

## 2019-03-06 DIAGNOSIS — C3411 Malignant neoplasm of upper lobe, right bronchus or lung: Secondary | ICD-10-CM | POA: Diagnosis not present

## 2019-03-06 DIAGNOSIS — Z20828 Contact with and (suspected) exposure to other viral communicable diseases: Secondary | ICD-10-CM | POA: Diagnosis not present

## 2019-03-07 DIAGNOSIS — J449 Chronic obstructive pulmonary disease, unspecified: Secondary | ICD-10-CM | POA: Diagnosis not present

## 2019-03-07 DIAGNOSIS — E785 Hyperlipidemia, unspecified: Secondary | ICD-10-CM | POA: Diagnosis not present

## 2019-03-07 DIAGNOSIS — I5031 Acute diastolic (congestive) heart failure: Secondary | ICD-10-CM | POA: Diagnosis not present

## 2019-03-07 DIAGNOSIS — I110 Hypertensive heart disease with heart failure: Secondary | ICD-10-CM | POA: Diagnosis not present

## 2019-03-07 DIAGNOSIS — C3411 Malignant neoplasm of upper lobe, right bronchus or lung: Secondary | ICD-10-CM | POA: Diagnosis not present

## 2019-03-07 DIAGNOSIS — I4891 Unspecified atrial fibrillation: Secondary | ICD-10-CM | POA: Diagnosis not present

## 2019-03-19 DEATH — deceased

## 2020-06-19 IMAGING — CR CHEST - 2 VIEW
2 series · 2 of 2 positions shown · non-contrast
Comparison: 05/01/2018 and 03/17/2018 as well as recent chest CT
04/29/2018

CLINICAL DATA: Follow-up recent pneumonia.

EXAM:
CHEST - 2 VIEW

[chest lat]
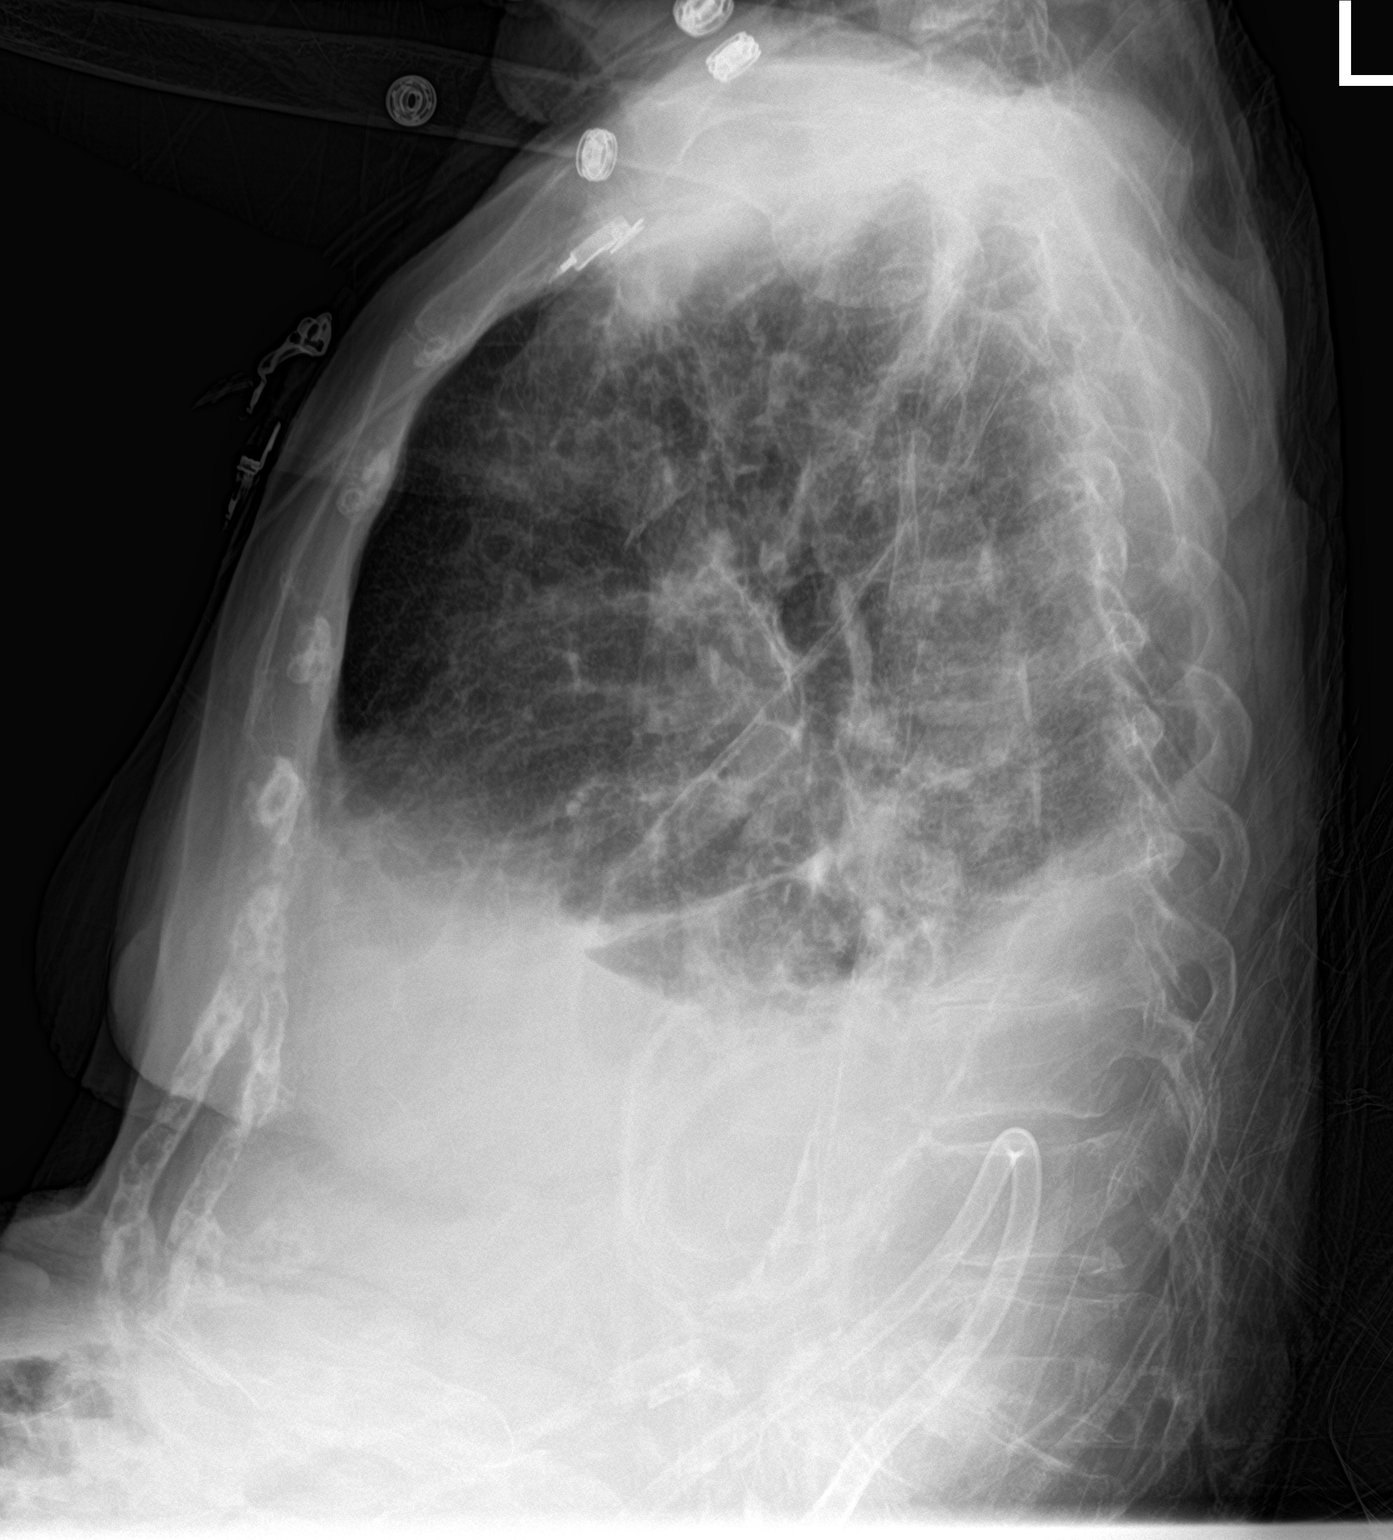

[chest ap]
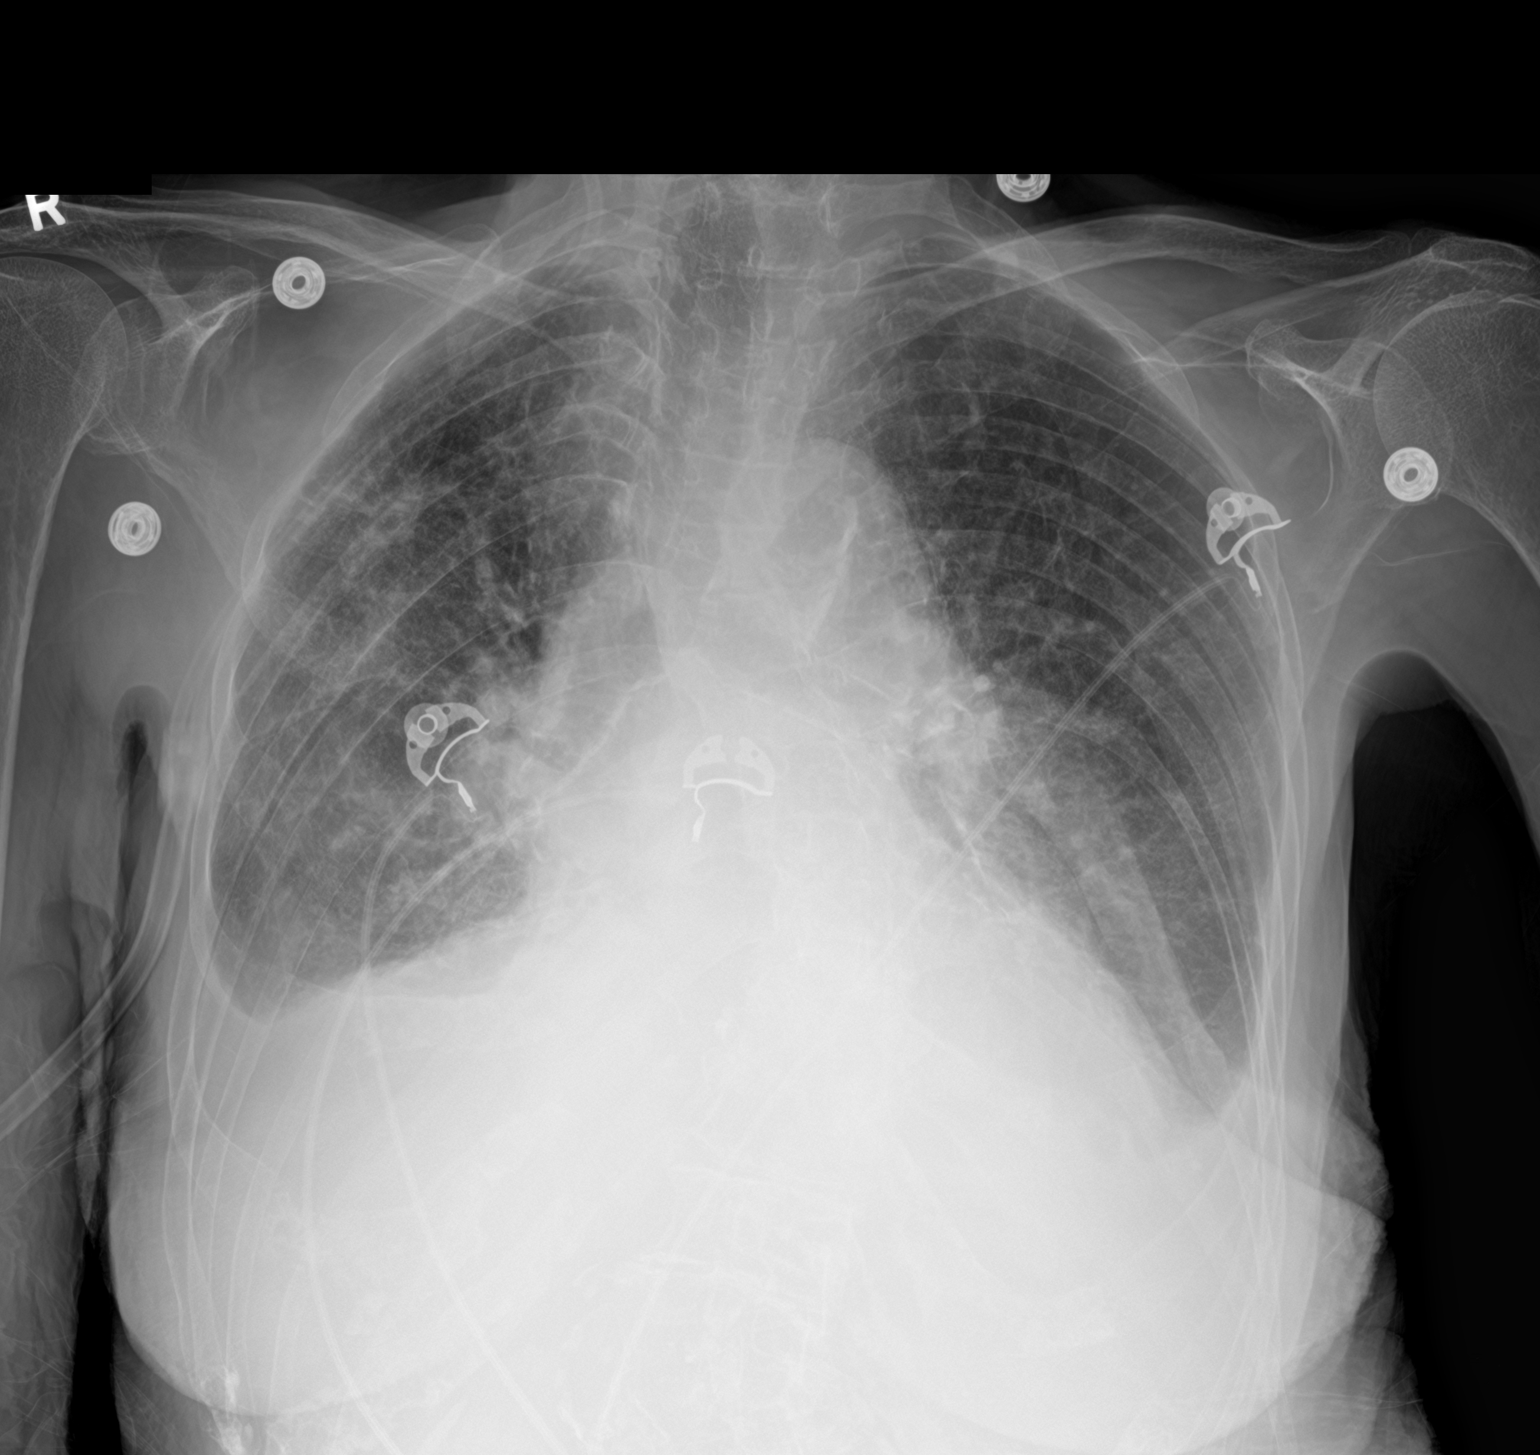

[2 of 2 positions shown; findings below may reference images not displayed]

FINDINGS: Lungs are adequately inflated demonstrate stable patchy density over
the right upper lobe known to represent area of scarring with
associated suspicious 1.4 cm nodule by recent CT. Stable small to
moderate right pleural effusion and stable small left pleural
effusion. Likely associated bibasilar atelectasis. Infection in the
lung bases is possible. Cardiomediastinal silhouette and remainder
of the exam is unchanged.
IMPRESSION: Stable patchy density in the right upper lobe known to represent
scarring with associated suspicious 1.4 cm nodule by recent chest
CT.

Stable bilateral pleural effusions likely with associated basilar
atelectasis. Infection in the lung bases is possible.
